# Patient Record
Sex: Female | Born: 1940
Health system: Southern US, Community
[De-identification: ages and names within clinical notes are randomized; demographics above are authoritative.]

## PROBLEM LIST (undated history)

## (undated) DIAGNOSIS — E7800 Pure hypercholesterolemia, unspecified: Secondary | ICD-10-CM

## (undated) DIAGNOSIS — K9189 Other postprocedural complications and disorders of digestive system: Secondary | ICD-10-CM

## (undated) DIAGNOSIS — C801 Malignant (primary) neoplasm, unspecified: Secondary | ICD-10-CM

## (undated) DIAGNOSIS — M810 Age-related osteoporosis without current pathological fracture: Secondary | ICD-10-CM

## (undated) DIAGNOSIS — I10 Essential (primary) hypertension: Secondary | ICD-10-CM

## (undated) DIAGNOSIS — J189 Pneumonia, unspecified organism: Secondary | ICD-10-CM

## (undated) DIAGNOSIS — R768 Other specified abnormal immunological findings in serum: Secondary | ICD-10-CM

## (undated) DIAGNOSIS — K567 Ileus, unspecified: Secondary | ICD-10-CM

## (undated) DIAGNOSIS — Z87891 Personal history of nicotine dependence: Secondary | ICD-10-CM

## (undated) DIAGNOSIS — K219 Gastro-esophageal reflux disease without esophagitis: Secondary | ICD-10-CM

## (undated) DIAGNOSIS — K56609 Unspecified intestinal obstruction, unspecified as to partial versus complete obstruction: Secondary | ICD-10-CM

## (undated) DIAGNOSIS — M1990 Unspecified osteoarthritis, unspecified site: Secondary | ICD-10-CM

## (undated) HISTORY — DX: Age-related osteoporosis without current pathological fracture: M81.0

## (undated) HISTORY — PX: BACK SURGERY: SHX140

## (undated) HISTORY — PX: TONSILLECTOMY: SUR1361

## (undated) HISTORY — PX: ABDOMINAL HYSTERECTOMY: SHX81

## (undated) HISTORY — PX: HERNIA REPAIR: SHX51

## (undated) HISTORY — DX: Other specified abnormal immunological findings in serum: R76.8

## (undated) HISTORY — PX: COLON SURGERY: SHX602

---

## 2001-01-27 DIAGNOSIS — R768 Other specified abnormal immunological findings in serum: Secondary | ICD-10-CM

## 2001-01-27 HISTORY — DX: Other specified abnormal immunological findings in serum: R76.8

## 2004-07-21 ENCOUNTER — Ambulatory Visit (HOSPITAL_COMMUNITY): Admission: RE | Admit: 2004-07-21 | Discharge: 2004-07-21 | Payer: Self-pay | Admitting: Family Medicine

## 2005-01-18 ENCOUNTER — Ambulatory Visit (HOSPITAL_COMMUNITY): Admission: RE | Admit: 2005-01-18 | Discharge: 2005-01-18 | Payer: Self-pay | Admitting: Family Medicine

## 2005-11-07 ENCOUNTER — Ambulatory Visit: Payer: Self-pay | Admitting: Internal Medicine

## 2005-11-07 ENCOUNTER — Encounter (INDEPENDENT_AMBULATORY_CARE_PROVIDER_SITE_OTHER): Payer: Self-pay | Admitting: *Deleted

## 2005-11-07 ENCOUNTER — Ambulatory Visit (HOSPITAL_COMMUNITY): Admission: RE | Admit: 2005-11-07 | Discharge: 2005-11-07 | Payer: Self-pay | Admitting: Internal Medicine

## 2005-11-19 ENCOUNTER — Encounter: Payer: Self-pay | Admitting: Emergency Medicine

## 2005-11-19 ENCOUNTER — Inpatient Hospital Stay (HOSPITAL_COMMUNITY): Admission: EM | Admit: 2005-11-19 | Discharge: 2005-11-22 | Payer: Self-pay | Admitting: Emergency Medicine

## 2005-11-19 ENCOUNTER — Ambulatory Visit: Payer: Self-pay | Admitting: Emergency Medicine

## 2006-12-04 ENCOUNTER — Ambulatory Visit (HOSPITAL_COMMUNITY): Admission: RE | Admit: 2006-12-04 | Discharge: 2006-12-04 | Payer: Self-pay | Admitting: Family Medicine

## 2007-01-27 ENCOUNTER — Emergency Department (HOSPITAL_COMMUNITY): Admission: EM | Admit: 2007-01-27 | Discharge: 2007-01-27 | Payer: Self-pay | Admitting: Emergency Medicine

## 2007-07-23 ENCOUNTER — Ambulatory Visit (HOSPITAL_COMMUNITY): Admission: RE | Admit: 2007-07-23 | Discharge: 2007-07-23 | Payer: Self-pay | Admitting: Family Medicine

## 2008-04-03 ENCOUNTER — Ambulatory Visit (HOSPITAL_COMMUNITY): Admission: RE | Admit: 2008-04-03 | Discharge: 2008-04-03 | Payer: Self-pay | Admitting: Family Medicine

## 2008-09-10 ENCOUNTER — Ambulatory Visit (HOSPITAL_COMMUNITY): Admission: RE | Admit: 2008-09-10 | Discharge: 2008-09-10 | Payer: Self-pay | Admitting: Family Medicine

## 2008-09-22 ENCOUNTER — Ambulatory Visit (HOSPITAL_COMMUNITY): Admission: RE | Admit: 2008-09-22 | Discharge: 2008-09-22 | Payer: Self-pay | Admitting: Family Medicine

## 2009-01-07 ENCOUNTER — Encounter: Admission: RE | Admit: 2009-01-07 | Discharge: 2009-01-07 | Payer: Self-pay | Admitting: General Surgery

## 2009-02-26 HISTORY — PX: ESOPHAGOGASTRODUODENOSCOPY: SHX1529

## 2010-10-14 NOTE — Op Note (Signed)
Terri Harris, Terri Harris                 ACCOUNT NO.:  [id number]   MEDICAL RECORD NO.:  [id number]          PATIENT TYPE:  INP   LOCATION:  3021                         FACILITY:  [facility]   PHYSICIAN:  Leslye Peer, M.D.  DATE OF BIRTH:  1940/08/07   DATE OF PROCEDURE:  DATE OF DISCHARGE:                                 OPERATIVE REPORT   REASON FOR CONSULTATION:  I was asked by Dr. Beckey Downing of neurology to  evaluate Terri Harris for ventilator-dependent respiratory failure.   BRIEF HISTORY:  Terri Harris is a 70 year old woman with a history of alcohol  abuse, osteopenia, depression, and a remote to transabdominal hysterectomy  for uterine cancer.  She may also have a small remote smoking history.  She  was noted to have altered mental status, inability to ambulate and speak,  and respiratory suppression by her family at home this afternoon.  EMS was  activated and when they arrived, she was unable to talk, not moving any of  her extremities in a coordinated manner.  Her blood pressure at that time  was 90/60.  She was taken urgently to [facility] where respiratory  rate was 6 per minute.  She was intubated and sedated.  A CT scan of her  head was performed due to concern about a possible stroke.  This did not  show any evidence of acute bleed or CVA.  Dr. Aundria Rud was contacted because  of their concern that she had neurological event, possibly even a brain stem  stroke.  She was transported urgently to the emergency department at [facility].  Subsequently, her toxicology evaluation revealed that she  had a blood alcohol level of 346.  The rest of her urine drug screen was  normal.  It was felt that her respiratory suppression and neurological  changes almost certainly were due to alcohol intoxication as she had no  evidence of a CVA on CT scan or exam.  We are consulted regarding her  ventilator-dependent respiratory status.   PAST MEDICAL HISTORY:  1.  Alcohol  abuse.  2.  Depression, formerly on Effexor.  3.  Osteopenia  4.  Transabdominal hysterectomy for uterine cancer 29 years ago.   ALLERGIES:  NO KNOWN DRUG ALLERGIES.   CURRENT MEDICATION:  None.   SOCIAL HISTORY:  The patient is married.  She is a former tobacco user; it  is unclear as to the amount.  She also has a long history of alcohol abuse.  She supposed had quit until about 1 month ago when she restarted.  She has  been on Antabuse in the past.   SOCIAL HISTORY:  Noncontributory.   PHYSICAL EXAM:  GENERAL:  This is a thin, slightly agitated woman who is  endotracheally intubated.  VITAL SIGNS:  She is afebrile.  Her heart rate 77, blood pressure 111/82  respiratory rate 20, SPO2 99% on the following vent settings, volume total  tidal volume 550 mL, respiratory rate 12, FIO2 50% PEEP of 5.  NECK:  Is without lymphadenopathy or JVD.  HEENT:  The endotracheal  tube was in good position.  She has some clear oral  secretions.  Her pupils are equally round and reactive to light and  accommodation.  HEART:  Regular without murmur.  LUNGS:  Clear to auscultation bilaterally.  ABDOMEN:  Well-healed midline scar.  There is no tenderness to palpation.  She has positive bowel sounds.  EXTREMITIES:  Have no cyanosis, clubbing or edema.  NEUROLOGICALLY:  She is a little bit sleepy but she follows commands.  She  tracks and moves all extremities to command.   LABORATORY EVALUATION:  Her blood alcohol level was 346 mg/dL as mentioned.  CBC, comprehensive metabolic panel and urinalysis from [facility] are all  reported as normal.  Her coagulation studies are reported as normal.  Her  chest x-ray was reviewed.  This shows endotracheal tube in good position  with some very mild left lower lobe atelectasis but otherwise completely  normal film.   IMPRESSION:  this is a 70 year old with ventilatory-dependent respiratory  failure due to alcohol ingestion and intoxication.  She currently is   spontaneously breathing, following commands and is waking up.   PLANS:  I will initiate a spontaneous breathing trial now and if she passes  the trial, I will extubate her in the emergency department.  She will then  be followed closely to ensure that she is not having any further respiratory  suppression until the alcohol is completely out of her system.  She will  likely be admitted by internal medicine overnight for observation.           ______________________________  Leslye Peer, M.D.     RSB/MEDQ  D:  11/19/2005  T:  11/20/2005  Job:  [id number]

## 2010-10-14 NOTE — Discharge Summary (Signed)
Terri Harris, Terri Harris                 ACCOUNT NO.:  [id number]   MEDICAL RECORD NO.:  [id number]          PATIENT TYPE:  INP   LOCATION:  3021                         FACILITY:  [facility]   PHYSICIAN:  Michaelyn Barter, M.D. DATE OF BIRTH:  1941/05/03   DATE OF ADMISSION:  11/19/2005  DATE OF DISCHARGE:  11/22/2005                                 DISCHARGE SUMMARY   Patient's primary care physician is unassigned.   FINAL DIAGNOSES:  1. Acute alcohol overdose.  2. Ventilator-dependent respiratory failure secondary to acute alcohol      overdose.  3. Corneal abrasion.  4. Depression.  5. Hypokalemia.   CONSULTATIONS:  1. Psychiatry with Dr. Jeanie Sewer.  2. Pulmonary critical care.   PROCEDURES:  1. Portable chest x-ray completed November 19, 2005.  2. A CT scan of the head without contrast complete November 19, 2005.   HISTORY OF PRESENT ILLNESS:  Terri Harris is a 70 year old female who  transferred from [facility] secondary to initially what was  believed to have possibly been a CVA.  She was intubated at [facility] due  to respiratory depression.  She was later discovered to have a blood alcohol  level of 350.  Dr. Thad Ranger, the neurologist in [place] was contacted and  asked the encompassed hospitalist to admit the patient.  Following her  admission to the hospital, she became more alert, and it became clear that  an alcohol overdose was the source of her respiratory depression.  Pulmonary  saw the patient in the emergency department, at which time she was  extubated.  It was reported that the patient had been babysitting her 5-year-  old granddaughter at the time of her overdose on alcohol.   PAST MEDICAL HISTORY:  Please see that dictated by Dr. Murray Hodgkins on November 19, 2005.   HOSPITAL COURSE:  1. Ventilator-dependent respiratory failure.  The patient was initially      intubated at [facility], but shortly after her arrival to the      emergency department at  [facility], she was extubated.  She      showed no other signs of respiratory distress throughout the course of      her hospitalization and never complained of any shortness of breath.  2. Alcohol overdose.  The patient was started on a multivitamin following      her admission into the hospital.  She never displayed any signs of      alcohol withdrawal throughout the course of her hospitalization.      Because of her history of alcohol abuse, psychiatry was consulted.  Dr.      Jeanie Sewer responded to the consultation on November 20, 2005.  He indicated      that the patient had a mood disorder not otherwise specified, acute      organic effects due to alcohol overdose, as well as functional history      of depression.  He also stated that she has alcohol dependence, as well      as an anxiety disorder not otherwise specified.  His recommendations      were that ideally, a chemical dependency inpatient rehabilitation      program with residential stay should be pursued once the patient was      medically cleared.  However, the patient and her husband both indicated      that they did not want to pursue inpatient treatment.  I spent close to      an hour trying to convince the patient to seek inpatient treatment.      She indicated that she wanted to go home instead.  3. Corneal abrasion.  The patient did not complain of any eye pain over      the course of her hospitalization.  4. Depression.  Again, Dr. Jeanie Sewer saw the patient, and he gave      recommendations with regards to medical management of her depression.  5. Hypokalemia.  The patient had a potassium level of 3.3 on June 25th      2007.  It was supplemented, and by June 26th, her potassium level was      noted to be 3.9.  By the date of discharge, the patient's condition      appeared to be stable.  Again, the patient and her husband both      indicated that they wanted to go home and that they did not want to      seek  inpatient treatment for alcohol abuse.   DISCHARGE MEDICATIONS:  The patient was discharged home on the following  medications:  1. Pepcid 20 mg 1 tablet p.o. b.i.d.  2. Folic acid 1 mg q. day.  3. Thiamine 100 mg 1 tablet q. day.  4. Effexor 37.5 mg 1 tablet q. day.  5. K-Dur 20 mEq 1 tablet q. day.   The patient was instructed to seek out chemical dependency inpatient  rehabilitation program with residential stay if possible and to stop  drinking alcohol, to also pursue marriage counseling.  She was told to  follow up with her primary care doctor within one to two weeks.      Michaelyn Barter, M.D.  Electronically Signed     OR/MEDQ  D:  12/22/2005  T:  12/22/2005  Job:  [id number]
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
## 2010-10-14 NOTE — Procedures (Signed)
Terri Harris, Terri Harris                 ACCOUNT NO.:  [id number]   MEDICAL RECORD NO.:  [id number]          PATIENT TYPE:  OUT   LOCATION:  RESP                          FACILITY:  [facility]   PHYSICIAN:  Edward L. Juanetta Gosling, M.D.DATE OF BIRTH:  1940/10/03   DATE OF PROCEDURE:  09/24/2008  DATE OF DISCHARGE:  09/22/2008                            PULMONARY FUNCTION TEST   PULMONARY FUNCTION TEST  1. Spirometry shows no definite ventilatory defect but does have what      appears to be airflow obstruction.  2. There is significant bronchodilator improvement.  This is      consistent with a clinical diagnosis of COPD.      Edward L. Juanetta Gosling, M.D.  Electronically Signed     ELH/MEDQ  D:  09/24/2008  T:  09/25/2008  Job:  [id number]   cc:   Lorin Picket A. Gerda Diss, MD  Fax: [phone]

## 2010-10-14 NOTE — H&P (Signed)
NAMECRYSTELLE, Terri Harris                 ACCOUNT NO.:  [id number]   MEDICAL RECORD NO.:  [id number]          PATIENT TYPE:  INP   LOCATION:  1828                         FACILITY:  [facility]   PHYSICIAN:  Lenon Curt. Chilton Si, M.D.  DATE OF BIRTH:  1940-07-14   DATE OF ADMISSION:  11/19/2005  DATE OF DISCHARGE:                                HISTORY & PHYSICAL   CHIEF COMPLAINT:  Respiratory depression; alcohol overdose.   HISTORY OF PRESENT ILLNESS:  This is a 70 year old white female followed in  primary care by Dr. Lilyan Punt in [place], [place], who was  transferred from [facility] to [facility] for a possible CVA.  The patient was intubated at [facility] due to respiratory depression.  She  was being loaded into the ambulance for transfer to [facility] when blood  alcohol results of 350 mg% was returned.  The staff an [facility] contacted  Dr. Thad Ranger, neurologist in [place], and he agreed to see the patient  and evaluate for CVA.  At the time of arrival at [facility], she was  beginning to get more alert, and it was clear that an alcohol overdose with  respiratory depression had occurred as opposed to a CVA.  Dr. Thad Ranger asked  the [facility] hospitalists to admit the patient.  She has been seen by  pulmonary in the emergency room, and successfully extubated.  This patient  has a prior history of alcohol abuse, although, by her husband's history,  she had not seemed to be doing badly lately.  She had been drinking more  beer than he had seen her do over the last year or two.  The patient was  babysitting a 11-year-old granddaughter at the time of her overdose of  alcohol.  She was found slumped over in a chair at the home of the  grandchild with decreased respirations.  The patient remained unresponsive  in the emergency room.  CT of the brain at [facility] was unremarkable, and  the labs were normal, except for the alcohol level of 348 mg/dl.  Drug  screen  was negative.   PAST HISTORY:  The patient is healthy.  1.  She has recurrent problems with indigestion and problems after eating      greasy meats or beef, according to her husband.  2.  Recurrent sinusitis episodes.  3.  In February of 2006, stress fracture of the left third and fourth      metatarsals was noted on x-ray.  Her husband states she has      osteoarthritis of the fingers and some decrease with hearing.   SURGERY:  Hysterectomy in the remote past.   ALLERGIES:  None.   MEDICATIONS:  1.  Over-the-counter multivitamins.  2.  Mylanta as needed.   FAMILY HISTORY:  Father died at age 64 of a myocardial infarction.  He had a  history of alcoholism.  Mother died at age 22 with cancer of the larynx and  possible colon.  She had 10 siblings.  Her husband is not aware of the  health of  all of these siblings.  At least 1 sister died of obesity and a  bad heart.  A brother died of a brain tumor.  The patient has 2 daughters,  both living and well, 1 with a pacemaker.  There is 1 grandchild, a  granddaughter, age 81, who is also living and well.   SOCIAL HISTORY:  The patient has been married once at an early age, and  continues to live with her husband.  She has been stressed in the last  couple of years, according to the husband.  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  Her husband retired about 6  months before that in 2005 from [employer].  They are now  battling getting health insurance.  The patient was raised in a very poor  family.  Her husband says that they both quit drinking a few years ago, but  she has resumed drinking beer lately.  The patient was hiding liquor before  she quit.  He drinks very little now.  The husband states that marital  stresses increased when they bought a beach house about 2 years ago at the  time of his retirement, and it has consumed his time and money, estimated  over $150,000.00.  The patient  has also rebelled against him controlling  her, and she has felt or expressed to him that he did not need to be looking  over her shoulder at all times.  Her husband states that he put new locks  on a bar that he has in his house to prevent her from getting into the  liquor there.  When asked what type liquor she preferred in the past, he  says anything, but typically a brown liquor.   REVIEW OF SYSTEMS:  From her husband.  GENERAL:  No complaints.  No  whitewash.  No fevers.  No other recurrent or chronic illnesses.  HEENT:  Wears contacts.  There is some diminished hearing.  PULMONARY:  No cough,  history of hemoptysis, chronic lung disorder, bronchitis or asthma.  HEART:  No palpitations, chest pains or known heart problems.  GASTROINTESTINAL:  Recurrent indigestion.  No prior history of ulcer disease, reflux disorders,  esophagitis, jaundice, hepatitis.  Does not generally have problems with  constipation or diarrhea.  GENITOURINARY:  No incontinence issues.  No  dysuria.  No recurrent urinary tract infections.  MUSCULOSKELETAL:  Osteoarthritis, especially of the hands and fingers.  Otherwise,  unremarkable.  No history of broken bones.  NEUROLOGIC:  No complaints.  PSYCHIATRIC:  Stress issues as noted under social history.  ENDOCRINE:  No  history of diabetes or thyroid disorder.   PHYSICAL EXAMINATION:  VITAL SIGNS:  Temperature 98.4, pulse 77 and regular,  respirations 16, blood pressure 117/66.  GENERAL:  Groggy but arousable female, cooperative with exam for the most  part, although she refuses to answer some questions.  HEENT:  Pupils equal,  round and reactive to light and accommodation.  Difficult to see into the  eyes.  She squeezes them shut pretty tightly most of the time due to the  discomfort, especially in the right eye, felt to be related to a corneal  abrasion when the contact lenses were removed following entry into the hospital.  Pinnae, external auditory canals and  TMs grossly normal.  Oropharynx without acute lesions or abrasions.  NECK:  Supple.  No thyromegaly, nodules, mass, bruit or adenopathy.  NODES:  None palpable  in cervical, axillary, inguinal or other areas.  SKIN:  Without petechiae, purpura or ecchymosis.  CHEST:  Clear to auscultation and percussion.  BREASTS:  Nontender.  No nodules or masses.  HEART:  Regular rhythm without gallop, murmur, click, rub, heave or thrill.  PMI at left fifth intercostal space in the midclavicular line.  ABDOMEN:  Nontender.  No organomegaly, mass or bruit.  GENITAL:  Deferred.  MUSCULOSKELETAL:  Grossly normal.  No significant deformities.  There is  mild tenderness of the fingers at the DIP and PIP joints.  NEUROLOGIC:  Sluggish, but otherwise grossly normal.  Cranial nerves intact.  No evidence of tremor.  No focal weakness.  Moves all extremities.  Babinski's shows downgoing greater toes bilaterally.  PSYCHIATRIC:  Mildly irritable.  Short answers.  Refuses to answer questions  about alcohol.   LABORATORY DATA:  Arterial blood gas on 100% oxygen while intubated showed a  pH of 7.389, PCO2 of 40.9, PO2 of 515.  CBC revealed hemoglobin of 14.4,  white blood cells 6400, platelets 227,000.  INR of 1.0.  Complete metabolic  panel revealed potassium of 3.3; otherwise normal.  Drug screen negative.  Alcohol level 346.  Urinalysis 0-2 RBCs per high power field.   PROBLEMS:  1.  Alcohol overdose, acute, with some chronic issues of alcohol abuse.  2.  Respiratory depression.  Spontaneously resolved after brief intubation      and respiratory support.  3.  Corneal abrasion, acute secondary to trauma from removing contact lens      in the emergency room.  4.  Marital and economic stresses as documented under social history.  5.  Possible depression.  May need or desire psychiatric referral.  The      issue should be explored once she is sober.      Lenon Curt Chilton Si, M.D.  Electronically Signed      AGG/MEDQ  D:  11/19/2005  T:  11/20/2005  Job:  [id number]

## 2010-10-14 NOTE — Consult Note (Signed)
Terri Harris, Terri Harris                 ACCOUNT NO.:  [id number]   MEDICAL RECORD NO.:  [id number]          PATIENT TYPE:  INP   LOCATION:  3021                         FACILITY:  [facility]   PHYSICIAN:  Casimiro Needle L. Reynolds, M.D.DATE OF BIRTH:  [birth year]   DATE OF CONSULTATION:  11/19/2005  DATE OF DISCHARGE:                                   CONSULTATION   REQUESTING PHYSICIAN:  [facility].   REASON FOR CONSULTATION:  Altered mental status, suspected stroke.   HISTORY OF PRESENT ILLNESS:  This is the initial inpatient consultation  evaluation of this 70 year old woman with no known chronic medical problems.  The patient reported to the [facility] emergency room today reportedly with  an acute alteration in mental status. According to records, the patient was  at home babysitting her 60-year-old granddaughter. She went into the house to  get a glass of water and the granddaughter later found her slumped over in a  chair. EMS was alerted, the patient had slow respirations and was  hypotensive with a blood pressure of 90/60 and a respiratory rate of 6. She  was described as not speaking clearly and not moving extremities. She did  not have focal weakness to one side or the other but was described as  generally weak. The patient was brought to the [facility] emergency room  where she was intubated and noted to have little if any gag reflex. Initial  concern was for an acute stroke. The patient had a CT of the head which did  not demonstrate a bleed. After discussion with the emergency room physician  at [facility], she was taken and transferred to [facility] emergency room  for further evaluation. Subsequently laboratory results began to return from  [facility] which demonstrated normal CBC, chemistries and drug screen but a  markedly elevated alcohol level in the 350 range. The patient was  transferred to [facility] for further stabilization and care and arrived  intubated. She  gradually became more responsive in the emergency department  and more examinable. Ultimately with the assistance of the critical care  team, the patient was extubated and is now alert. She is able to follow  commands and speak a few brief words. She is not presently having any pain.   PAST MEDICAL HISTORY:  She denies any chronic medical problems. She has a  previous history of hysterectomy for uterine cancer.   SOCIAL HISTORY:  She has had a history of alcohol abuse in the past. She was  not known to be actively drinking at this time according to family members  although she has had episodes of binge drinking and become ill before. There  is no known history of other illicit drugs. She has a remote smoking  history. She lives at home with her husband and used to work for [employer].   REVIEW OF SYSTEMS:  Not obtainable due to the patient's altered mental  state. There has been no definite recent illness according to family members  at the bedside.   MEDICATIONS:  None.  ALLERGIES:  Denies.   PHYSICAL EXAMINATION:  VITAL SIGNS:  Temperature 98.4, blood pressure  111/82, pulse 77, respirations 24, O2 sat 88% on room air.  GENERAL:  This is a thin but healthy-appearing woman supine in the hospital  bed in no evident distress.  HEENT:  Head, cranium was normocephalic, atraumatic. Oropharynx benign.  NECK:  Supple without carotid or supraclavicular bruits.  HEART:  Regular rate and rhythm without murmurs.  NEUROLOGIC:  Mental status, she arrived deeply comatose responsive only to  deep pain. Her mental status has gradually improved to the point that she is  now drowsy but able to form words and phrases and follow commands. Cranial  nerves, pupils equal and reactive. She has corneal reflexes. Extraocular  movements full without nystagmus. Face, tongue and palate move  symmetrically. Motor, normal bulk and tone, normal strength in all tested  extremity muscles. Sensation intact  to light touch in all extremities.  Reflexes 2+ and symmetric. Toes are downgoing bilaterally. Cerebellar and  gait are deferred.   LABORATORY DATA:  CBC unremarkable, CMET unremarkable. Urinalysis  unremarkable. Urine drug screen unremarkable. Alcohol level markedly  elevated as above.   CT of the head is personally reviewed and demonstrates no acute abnormality.   IMPRESSION:  Acute encephalopathy due to alcohol intoxication, clearing. No  evidence of stroke at this time.   PLAN:  Provide supportive care as she returns to her baseline. If she  becomes back to her normal baseline neurologically, there is no need for  further neural imaging or other workup.   Thank you for the consultation.      Michael L. Thad Ranger, M.D.  Electronically Signed     MLR/MEDQ  D:  11/19/2005  T:  11/20/2005  Job:  [id number]

## 2010-10-14 NOTE — Op Note (Signed)
Terri Harris, Terri Harris                 ACCOUNT NO.:  [id number]   MEDICAL RECORD NO.:  [id number]          PATIENT TYPE:  AMB   LOCATION:  DAY                           FACILITY:  [facility]   PHYSICIAN:  Lionel December, M.D.    DATE OF BIRTH:  1940-09-27   DATE OF PROCEDURE:  11/07/2005  DATE OF DISCHARGE:                                 OPERATIVE REPORT   PROCEDURE PERFORMED:  Colonoscopy.   INDICATIONS FOR PROCEDURE:  Adina is a 70 year old Caucasian female who was  average risk screening colonoscopy.  Procedure risks were reviewed with the  patient and informed consent was obtained.   MEDS FOR CONSCIOUS SEDATION:  Demerol 50 mg IV, Versed 6 mg IV.   FINDINGS:  Procedure performed in endoscopy suite.  The patient's vital  signs and oxygen saturation were monitored during the procedure and remained  stable.  The patient was placed in left lateral position and rectal  examination performed.  No abnormality noted on external or digital exam.  Olympus video scope was placed in the rectum and advanced under vision into  sigmoid colon and beyond.  Preparation was satisfactory.  Scope was passed  to cecum which was identified by ileocecal valve and appendiceal.  Pictures  taken for the record.  As the scope was withdrawn, colonic mucosa was  carefully examined.  There were three small sessile polyps at ascending  colon suspicious for hyperplastic polyps.  Two of these were ablated by cold  biopsy and submitted one container.  The third polyp was submitted in  separate container.  Mucosa of rest of the colon was normal.  The rectal  mucosa similarly was normal.  Scope was retroflexed to examine the anorectal  junction which is unremarkable except for focal thickening of mucosa in the  canal.  Pictures taken for the record.  Endoscope was straightened and  withdrawn.  The patient tolerated the procedure well.   FINAL DIAGNOSIS:  Three small polyps at ascending colon which were ablated  by cold  biopsy.  These are suspicious for hyperplastic polyp.  Histology  confirms this then she will not need __________  Three small sessile polyps  ablated by cold biopsy from ascending colon.  These are suspicious for  hyperplastic polyps.   RECOMMENDATIONS:  1.  Patient will resume her usual meds.  2.  I will be contacting patient with biopsy results and further      recommendations.      Lionel December, M.D.  Electronically Signed     NR/MEDQ  D:  11/07/2005  T:  11/07/2005  Job:  [id number]   cc:   Lorin Picket A. Gerda Diss, MD  Fax: [phone]

## 2010-12-10 ENCOUNTER — Emergency Department (HOSPITAL_COMMUNITY): Payer: Medicare Other

## 2010-12-10 ENCOUNTER — Emergency Department (HOSPITAL_COMMUNITY)
Admission: EM | Admit: 2010-12-10 | Discharge: 2010-12-10 | Disposition: A | Discharge status: Home / Self Care | Payer: Medicare Other | Attending: Emergency Medicine | Admitting: Emergency Medicine

## 2010-12-10 DIAGNOSIS — K4090 Unilateral inguinal hernia, without obstruction or gangrene, not specified as recurrent: Secondary | ICD-10-CM | POA: Insufficient documentation

## 2010-12-10 DIAGNOSIS — Z79899 Other long term (current) drug therapy: Secondary | ICD-10-CM | POA: Insufficient documentation

## 2010-12-10 DIAGNOSIS — F17200 Nicotine dependence, unspecified, uncomplicated: Secondary | ICD-10-CM | POA: Insufficient documentation

## 2010-12-10 DIAGNOSIS — R1032 Left lower quadrant pain: Secondary | ICD-10-CM | POA: Insufficient documentation

## 2010-12-10 LAB — DIFFERENTIAL
Basophils Absolute: 0.1 10*3/uL (ref 0.0–0.1)
Basophils Relative: 1 % (ref 0–1)
Eosinophils Absolute: 0.2 10*3/uL (ref 0.0–0.7)
Eosinophils Relative: 2 % (ref 0–5)
Lymphocytes Relative: 16 % (ref 12–46)
Lymphs Abs: 1.5 10*3/uL (ref 0.7–4.0)
Monocytes Absolute: 0.7 10*3/uL (ref 0.1–1.0)
Monocytes Relative: 8 % (ref 3–12)
Neutro Abs: 7.3 10*3/uL (ref 1.7–7.7)
Neutrophils Relative %: 75 % (ref 43–77)

## 2010-12-10 LAB — COMPREHENSIVE METABOLIC PANEL
ALT: 14 U/L (ref 0–35)
AST: 24 U/L (ref 0–37)
Albumin: 4.1 g/dL (ref 3.5–5.2)
Alkaline Phosphatase: 84 U/L (ref 39–117)
BUN: 16 mg/dL (ref 6–23)
CO2: 27 mEq/L (ref 19–32)
Calcium: 9.5 mg/dL (ref 8.4–10.5)
Chloride: 101 mEq/L (ref 96–112)
Creatinine, Ser: 0.67 mg/dL (ref 0.50–1.10)
GFR calc Af Amer: >60 mL/min (ref >60)
GFR calc non Af Amer: >60 mL/min (ref >60)
Glucose, Bld: 80 mg/dL (ref 70–99)
Potassium: 3.5 mEq/L (ref 3.5–5.1)
Sodium: 137 mEq/L (ref 135–145)
Total Bilirubin: 0.1 mg/dL — ABNORMAL LOW (ref 0.3–1.2)
Total Protein: 7.8 g/dL (ref 6.0–8.3)

## 2010-12-10 LAB — CBC
HCT: 41.2 % (ref 36.0–46.0)
Hemoglobin: 14 g/dL (ref 12.0–15.0)
MCH: 32.9 pg (ref 26.0–34.0)
MCHC: 34 g/dL (ref 30.0–36.0)
MCV: 96.7 fL (ref 78.0–100.0)
Platelets: 213 10*3/uL (ref 150–400)
RBC: 4.26 MIL/uL (ref 3.87–5.11)
RDW: 12.8 % (ref 11.5–15.5)
WBC: 9.8 10*3/uL (ref 4.0–10.5)

## 2010-12-10 LAB — URINALYSIS, ROUTINE W REFLEX MICROSCOPIC
Bilirubin Urine: NEGATIVE
Glucose, UA: NEGATIVE mg/dL
Ketones, ur: NEGATIVE mg/dL
Leukocytes, UA: NEGATIVE
Nitrite: NEGATIVE
Protein, ur: NEGATIVE mg/dL
Specific Gravity, Urine: <1.005 — ABNORMAL LOW (ref 1.005–1.030)
Urobilinogen, UA: 0.2 mg/dL (ref 0.0–1.0)
pH: 5.5 (ref 5.0–8.0)

## 2010-12-10 LAB — URINE MICROSCOPIC-ADD ON

## 2010-12-10 LAB — LIPASE, BLOOD: Lipase: 44 U/L (ref 11–59)

## 2010-12-10 MED ORDER — SODIUM CHLORIDE 0.9 % IV SOLN
Freq: Once | INTRAVENOUS | Status: AC
  Administered 2010-12-10: 100 mL via INTRAVENOUS

## 2010-12-10 MED ORDER — ONDANSETRON HCL 4 MG/2ML IJ SOLN
4.0000 mg | Freq: Once | INTRAMUSCULAR | Status: AC
  Administered 2010-12-10: 4 mg via INTRAVENOUS
  Filled 2010-12-10: qty 2

## 2010-12-10 MED ORDER — IOHEXOL 300 MG/ML  SOLN
100.0000 mL | Freq: Once | INTRAMUSCULAR | Status: AC | PRN
  Administered 2010-12-10: 100 mL via INTRAVENOUS

## 2010-12-10 NOTE — ED Notes (Signed)
Pt resting quietly with family at bedside.  Tolerated PIV insertion well.  Zofran given per orders.

## 2010-12-10 NOTE — ED Provider Notes (Signed)
History     Chief Complaint  Patient presents with  . Abdominal Pain   HPI  History reviewed. No pertinent past medical history.  Past Surgical History  Procedure Date  . Abdominal hysterectomy     History reviewed. No pertinent family history.  History  Substance Use Topics  . Smoking status: Current Some Day Smoker    Types: Cigarettes  . Smokeless tobacco: Not on file  . Alcohol Use: No    OB History    Grav Para Term Preterm Abortions TAB SAB Ect Mult Living                  Review of Systems  Physical Exam  BP 177/83  Pulse 73  Temp(Src) 97.9 F (36.6 C) (Oral)  Resp 20  Ht 5\' 6"  (1.676 m)  Wt 140 lb (63.504 kg)  BMI 22.60 kg/m2  SpO2 98%  Physical Exam  ED Course  Procedures  MDM       I performed a history and physical examination of Fanny Skates and discussed her management with PA Hyacinth Meeker.  I agree with the history, physical, assessment, and plan of care, with the following exceptions: None  I was present for the following procedures: None Time Spent in Critical Care of the patient: None Time spent in discussions with the patient and family:  PT relates she started getting LLQ pain that is a pressure during the night. States it is nagging. Has had nausea, without vomiting. Had one normal BM but has the urge to have more BM's. States her last colonoscopy was about 5 years ago and she had polyps but no diverticulosis. Pt has tenderness in the LLQ without guarding or rebound.  Jabaree Mercado Thomasenia Sales, MD 12/10/10 1600

## 2010-12-10 NOTE — ED Notes (Signed)
Pt presents with left sided abdominal pain radiating around to left side of back. Pt also c/o urinary pressure. Pt states symptoms started last night but have increased.

## 2010-12-10 NOTE — ED Provider Notes (Signed)
History     Chief Complaint  Patient presents with  . Abdominal Pain   Patient is a 70 y.o. female presenting with abdominal pain. The history is provided by the patient. No language interpreter was used.  Abdominal Pain The primary symptoms of the illness include abdominal pain and nausea. The primary symptoms of the illness do not include fever, vomiting, diarrhea, hematochezia, dysuria, vaginal discharge or vaginal bleeding. The current episode started yesterday. The onset of the illness was sudden. The problem has been gradually worsening.  The abdominal pain is located in the LLQ. The abdominal pain radiates to the left flank. The severity of the abdominal pain is 6/10. The abdominal pain is relieved by nothing.  Symptoms associated with the illness do not include urgency, hematuria or frequency.    History reviewed. No pertinent past medical history.  Past Surgical History  Procedure Date  . Abdominal hysterectomy     History reviewed. No pertinent family history.  History  Substance Use Topics  . Smoking status: Current Some Day Smoker    Types: Cigarettes  . Smokeless tobacco: Not on file  . Alcohol Use: No    OB History    Grav Para Term Preterm Abortions TAB SAB Ect Mult Living                  Review of Systems  Constitutional: Negative for fever.  Gastrointestinal: Positive for nausea and abdominal pain. Negative for vomiting, diarrhea and hematochezia.  Genitourinary: Positive for flank pain. Negative for dysuria, urgency, frequency, hematuria, vaginal bleeding, vaginal discharge and pelvic pain.    Physical Exam  BP 177/83  Pulse 73  Temp(Src) 97.9 F (36.6 C) (Oral)  Resp 20  Ht 5\' 6"  (1.676 m)  Wt 140 lb (63.504 kg)  BMI 22.60 kg/m2  SpO2 98%  Physical Exam  Nursing note and vitals reviewed. Constitutional: She is oriented to person, place, and time. Vital signs are normal. She appears well-developed and well-nourished.  HENT:  Head:  Normocephalic and atraumatic.  Right Ear: External ear normal.  Left Ear: External ear normal.  Nose: Nose normal.  Mouth/Throat: No oropharyngeal exudate.  Eyes: Conjunctivae and EOM are normal. Pupils are equal, round, and reactive to light. Right eye exhibits no discharge. Left eye exhibits no discharge. No scleral icterus.  Neck: Normal range of motion. Neck supple. No JVD present. No tracheal deviation present. No thyromegaly present.  Cardiovascular: Normal rate, regular rhythm, normal heart sounds, intact distal pulses and normal pulses.  Exam reveals no gallop and no friction rub.   No murmur heard. Pulmonary/Chest: Effort normal and breath sounds normal. No stridor. No respiratory distress. She has no wheezes. She has no rales. She exhibits no tenderness.  Abdominal: Soft. Normal appearance and bowel sounds are normal. She exhibits no distension and no mass. There is no tenderness. There is no rebound and no guarding.    Musculoskeletal: Normal range of motion. She exhibits no edema and no tenderness.  Lymphadenopathy:    She has no cervical adenopathy.  Neurological: She is alert and oriented to person, place, and time. She has normal reflexes. No cranial nerve deficit. Coordination normal. GCS eye subscore is 4. GCS verbal subscore is 5. GCS motor subscore is 6.  Reflex Scores:      Tricep reflexes are 2+ on the right side and 2+ on the left side.      Bicep reflexes are 2+ on the right side and 2+ on the left side.  Brachioradialis reflexes are 2+ on the right side and 2+ on the left side.      Patellar reflexes are 2+ on the right side and 2+ on the left side.      Achilles reflexes are 2+ on the right side and 2+ on the left side. Skin: Skin is warm and dry. No rash noted. She is not diaphoretic.  Psychiatric: She has a normal mood and affect. Her speech is normal and behavior is normal. Judgment and thought content normal. Cognition and memory are normal.    ED Course    Procedures       Worthy Rancher, PA 12/26/10 1709

## 2010-12-10 NOTE — ED Notes (Signed)
Radiology tech present to transport pt . Pt awake, orientated and slightly anxious about ct process. Pt reassured.

## 2010-12-10 NOTE — ED Notes (Signed)
Al Decant PA at bedside, Explained findings of all tests completed in ED today.  Pt and family asked appropriate questions and verbalized understanding.

## 2010-12-10 NOTE — ED Notes (Signed)
Family at bedside. 

## 2011-01-03 NOTE — ED Provider Notes (Signed)
Medical screening examination/treatment/procedure(s) were conducted as a shared visit with non-physician practitioner(s) and myself.  I personally evaluated the patient during the encounter  Ward Givens, MD 01/03/11 [phone]

## 2011-03-10 LAB — DIFFERENTIAL
Basophils Absolute: 0.1
Basophils Relative: 1
Eosinophils Absolute: 0.1
Eosinophils Relative: 2
Lymphocytes Relative: 30
Lymphs Abs: 1.7
Monocytes Absolute: 0.5
Monocytes Relative: 8
Neutro Abs: 3.3
Neutrophils Relative %: 58

## 2011-03-10 LAB — BASIC METABOLIC PANEL
BUN: 14
CO2: 24
Calcium: 9.3
Chloride: 107
Creatinine, Ser: 0.64
GFR calc Af Amer: >60
GFR calc non Af Amer: >60
Glucose, Bld: 110 — ABNORMAL HIGH
Potassium: 3.7
Sodium: 139

## 2011-03-10 LAB — POCT CARDIAC MARKERS
CKMB, poc: <1 — ABNORMAL LOW
Myoglobin, poc: 22.4
Operator id: 270681
Troponin i, poc: <0.05

## 2011-03-10 LAB — CBC
HCT: 43.5
Hemoglobin: 14.6
MCHC: 33.7
MCV: 95.9
Platelets: 220
RBC: 4.53
RDW: 13.1
WBC: 5.6

## 2011-07-06 DIAGNOSIS — J069 Acute upper respiratory infection, unspecified: Secondary | ICD-10-CM | POA: Diagnosis not present

## 2011-07-06 DIAGNOSIS — J40 Bronchitis, not specified as acute or chronic: Secondary | ICD-10-CM | POA: Diagnosis not present

## 2011-08-15 ENCOUNTER — Telehealth (INDEPENDENT_AMBULATORY_CARE_PROVIDER_SITE_OTHER): Payer: Self-pay | Admitting: *Deleted

## 2011-08-15 ENCOUNTER — Other Ambulatory Visit (INDEPENDENT_AMBULATORY_CARE_PROVIDER_SITE_OTHER): Payer: Self-pay | Admitting: *Deleted

## 2011-08-15 DIAGNOSIS — E782 Mixed hyperlipidemia: Secondary | ICD-10-CM | POA: Diagnosis not present

## 2011-08-15 DIAGNOSIS — R5381 Other malaise: Secondary | ICD-10-CM | POA: Diagnosis not present

## 2011-08-15 DIAGNOSIS — Z86010 Personal history of colonic polyps: Secondary | ICD-10-CM

## 2011-08-15 DIAGNOSIS — R5383 Other fatigue: Secondary | ICD-10-CM | POA: Diagnosis not present

## 2011-08-15 DIAGNOSIS — Z79899 Other long term (current) drug therapy: Secondary | ICD-10-CM | POA: Diagnosis not present

## 2011-08-15 NOTE — Telephone Encounter (Signed)
Patient needs movi prep 

## 2011-08-17 MED ORDER — PEG-KCL-NACL-NASULF-NA ASC-C 100 G PO SOLR: 1.0000 | Freq: Once | ORAL | Status: DC

## 2011-09-04 ENCOUNTER — Encounter (INDEPENDENT_AMBULATORY_CARE_PROVIDER_SITE_OTHER): Payer: Self-pay | Admitting: *Deleted

## 2011-09-27 ENCOUNTER — Telehealth (INDEPENDENT_AMBULATORY_CARE_PROVIDER_SITE_OTHER): Payer: Self-pay | Admitting: *Deleted

## 2011-09-27 NOTE — Telephone Encounter (Signed)
PCP/Requesting MD: scott luking  Name & DOB: Terri Harris 11-Oct-2040     Procedure: tcs  Reason/Indication:  Hx polyps  Has patient had this procedure before?  yes  If so, when, by whom and where?  6/07  Is there a family history of colon cancer?  no  Who?  What age when diagnosed?    Is patient diabetic?   no      Does patient have prosthetic heart valve?  no  Do you have a pacemaker?  no  Has patient had joint replacement within last 12 months?  no  Is patient on Coumadin, Plavix and/or Aspirin? no  Medications: see EPIC  Allergies: codeine  Medication Adjustment: none  Procedure date & time: 10/25/11

## 2011-09-28 NOTE — Telephone Encounter (Signed)
agree

## 2011-10-24 ENCOUNTER — Encounter (HOSPITAL_COMMUNITY): Payer: Self-pay | Admitting: Pharmacy Technician

## 2011-10-24 MED ORDER — SODIUM CHLORIDE 0.45 % IV SOLN
Freq: Once | INTRAVENOUS | Status: AC
  Administered 2011-10-25: 07:00:00 via INTRAVENOUS

## 2011-10-25 ENCOUNTER — Ambulatory Visit (HOSPITAL_COMMUNITY)
Admission: RE | Admit: 2011-10-25 | Discharge: 2011-10-25 | Disposition: A | Discharge status: Home / Self Care | Payer: Medicare Other | Source: Ambulatory Visit | Attending: Internal Medicine | Admitting: Internal Medicine

## 2011-10-25 ENCOUNTER — Encounter (HOSPITAL_COMMUNITY): Payer: Self-pay

## 2011-10-25 ENCOUNTER — Encounter (HOSPITAL_COMMUNITY)
Admission: RE | Disposition: A | Discharge status: Home / Self Care | Payer: Self-pay | Source: Ambulatory Visit | Attending: Internal Medicine

## 2011-10-25 DIAGNOSIS — Z860100 Personal history of colon polyps, unspecified: Secondary | ICD-10-CM | POA: Insufficient documentation

## 2011-10-25 DIAGNOSIS — Z09 Encounter for follow-up examination after completed treatment for conditions other than malignant neoplasm: Secondary | ICD-10-CM | POA: Diagnosis not present

## 2011-10-25 DIAGNOSIS — D126 Benign neoplasm of colon, unspecified: Secondary | ICD-10-CM | POA: Insufficient documentation

## 2011-10-25 HISTORY — DX: Gastro-esophageal reflux disease without esophagitis: K21.9

## 2011-10-25 HISTORY — DX: Pure hypercholesterolemia, unspecified: E78.00

## 2011-10-25 HISTORY — DX: Unspecified osteoarthritis, unspecified site: M19.90

## 2011-10-25 HISTORY — PX: COLONOSCOPY: SHX5424

## 2011-10-25 HISTORY — DX: Malignant (primary) neoplasm, unspecified: C80.1

## 2011-10-25 SURGERY — COLONOSCOPY
Anesthesia: Moderate Sedation

## 2011-10-25 MED ORDER — MEPERIDINE HCL 50 MG/ML IJ SOLN
INTRAMUSCULAR | Status: AC
  Filled 2011-10-25: qty 1

## 2011-10-25 MED ORDER — MIDAZOLAM HCL 5 MG/5ML IJ SOLN
INTRAMUSCULAR | Status: AC
  Filled 2011-10-25: qty 5

## 2011-10-25 MED ORDER — MIDAZOLAM HCL 5 MG/5ML IJ SOLN
INTRAMUSCULAR | Status: DC | PRN
  Administered 2011-10-25: 2 mg via INTRAVENOUS
  Administered 2011-10-25 (×2): 1 mg via INTRAVENOUS
  Administered 2011-10-25: 2 mg via INTRAVENOUS

## 2011-10-25 MED ORDER — MEPERIDINE HCL 50 MG/ML IJ SOLN
INTRAMUSCULAR | Status: DC | PRN
  Administered 2011-10-25 (×2): 25 mg via INTRAVENOUS

## 2011-10-25 MED ORDER — SODIUM CHLORIDE 0.9 % IJ SOLN
INTRAMUSCULAR | Status: DC | PRN
  Administered 2011-10-25: 10 mL

## 2011-10-25 MED ORDER — STERILE WATER FOR IRRIGATION IR SOLN
Status: DC | PRN
  Administered 2011-10-25: 09:00:00

## 2011-10-25 NOTE — Op Note (Signed)
COLONOSCOPY PROCEDURE REPORT  PATIENT:  [name]  MR#:  [id number] Birthdate:  April 23, 1941, 71 y.o., female Endoscopist:  Dr. Malissa Hippo, MD Referred By:  Dr. Lilyan Punt, M.D. Procedure Date: 10/25/2011  Procedure:   Colonoscopy  Indications:  Patient is 71 year old Caucasian female with history of serrated adenoma. Her last exam was in June 2007.  Informed Consent:  The procedure and risks were reviewed with the patient and informed consent was obtained.  Medications:  Demerol 50 mg IV Versed 6 mg IV  Description of procedure:  After a digital rectal exam was performed, that colonoscope was advanced from the anus through the rectum and colon to the area of the cecum, ileocecal valve and appendiceal orifice. The cecum was deeply intubated. These structures were well-seen and photographed for the record. From the level of the cecum and ileocecal valve, the scope was slowly and cautiously withdrawn. The mucosal surfaces were carefully surveyed utilizing scope tip to flexion to facilitate fold flattening as needed. The scope was pulled down into the rectum where a thorough exam including retroflexion was performed.  Findings:   Prep satisfactory. Over 2 cm size flat polyp at ascending colon with geographic shape. Part of the polyp could not be elevated with saline. Two fragments snared along with cold biopsy from two other areas. Part of this polyp was coagulated with argon plasma coagulator all of the polyp was not treated. Two small polyps at splenic flexure ablated via cold biopsy. Normal rectal mucosa. Thickened anoderm.  Therapeutic/Diagnostic Maneuvers Performed:  See above  Complications:  None  Cecal Withdrawal Time:  28 minutes  Impression:  Examination performed to cecum. Large flat polyp at ascending colon treated with combination of piecemeal polypectomy and APC. All of the polyp could not be lifted with saline injection. Two small polyps ablated via cold biopsy  from splenic flexure.  Recommendations:  Standard instructions given. I would be contacting patient with biopsy results and further recommendations.  Ednamae Schiano U  10/25/2011 8:35 AM  CC: Dr. Lilyan Punt, MD, MD & Dr. Bonnetta Barry ref. provider found

## 2011-10-25 NOTE — H&P (Signed)
Terri Harris is an 71 y.o. female.   Chief Complaint: Patient is here for colonoscopy. HPI: Patient is 71 year old Caucasian female who had serrated adenoma removed in June 2007. She is here for surveillance colonoscopy. In February she noted blood-tinged mucus that she cold. She was also constipated and this occurred. She does not have good appetite her weight has remained stable. She denies abdominal pain or frank bleeding. Family history is negative for colorectal carcinoma.  Past Medical History  Diagnosis Date  . Hypercholesteremia   . GERD (gastroesophageal reflux disease)   . Cancer     partial hysterectomy age 26-uterine cancer  . Arthritis     Past Surgical History  Procedure Date  . Abdominal hysterectomy     Family History  Problem Relation Age of Onset  . Anesthesia problems Neg Hx   . Hypotension Neg Hx   . Malignant hyperthermia Neg Hx   . Pseudochol deficiency Neg Hx    Social History:  reports that she quit smoking 7 days ago. Her smoking use included Cigarettes. She has a 5 pack-year smoking history. She does not have any smokeless tobacco history on file. She reports that she does not drink alcohol or use illicit drugs.  Allergies:  Allergies  Allergen Reactions  . Codeine Nausea And Vomiting    Medications Prior to Admission  Medication Sig Dispense Refill  . acetaminophen (TYLENOL) 500 MG tablet Take 1,000 mg by mouth every 6 (six) hours as needed. Pain      . Calcium Carbonate-Vitamin D (CALCIUM 600 + D PO) Take 2 tablets by mouth daily.      . Multiple Vitamin (MULITIVITAMIN WITH MINERALS) TABS Take 1 tablet by mouth daily.      . peg 3350 powder (MOVIPREP) 100 G SOLR Take 1 kit (100 g total) by mouth once.  1 kit  0    No results found for this or any previous visit (from the past 48 hour(s)). No results found.  ROS  Blood pressure 158/84, pulse 79, temperature 97.8 F (36.6 C), resp. rate 25, height 5\' 6"  (1.676 m), weight 140 lb (63.504 kg),  SpO2 98.00%. Physical Exam  Constitutional: She appears well-developed and well-nourished.  HENT:  Mouth/Throat: Oropharynx is clear and moist.  Eyes: Conjunctivae are normal. No scleral icterus.  Neck: No thyromegaly present.  Cardiovascular: Normal rate, regular rhythm and normal heart sounds.   No murmur heard. Respiratory: Effort normal and breath sounds normal.  GI: Soft. She exhibits no distension and no mass.  Musculoskeletal: She exhibits no edema.  Lymphadenopathy:    She has no cervical adenopathy.  Neurological: She is alert.  Skin: Skin is warm.     Assessment/Plan History of colonic serrated adenoma. Surveillance colonoscopy  Terri Harris U 10/25/2011, 7:31 AM

## 2011-10-25 NOTE — Discharge Instructions (Signed)
No aspirin or NSAIDs for 10 days. Resume usual medications and diet. No driving for 24 hours. Physician will contact you with biopsy results.  Colonoscopy Care After These instructions give you information on caring for yourself after your procedure. Your doctor may also give you more specific instructions. Call your doctor if you have any problems or questions after your procedure. HOME CARE  Take it easy for the next 24 hours.   Rest.   Walk or use warm packs on your belly (abdomen) if you have belly cramping or gas.   Do not drive for 24 hours.   You may shower.   Do not sign important papers or use machinery for 24 hours.   Drink enough fluids to keep your pee (urine) clear or pale yellow.   Resume your normal diet. Avoid heavy or fried foods.   Avoid alcohol.   Continue taking your normal medicines.   Only take medicine as told by your doctor. Do not take aspirin.  If you had growths (polyps) removed:  Do not take aspirin.   Do not drink alcohol for 7 days or as told by your doctor.   Eat a soft diet for 24 hours.  GET HELP RIGHT AWAY IF:  You have a fever.   You pass clumps of tissue (blood clots) or fill the toilet with blood.   You have belly pain that gets worse and medicine does not help.   Your belly is puffy (swollen).   You feel sick to your stomach (nauseous) or throw up (vomit).  MAKE SURE YOU:  Understand these instructions.   Will watch your condition.   Will get help right away if you are not doing well or get worse.  Document Released: 06/17/2010 Document Revised: 05/04/2011 Document Reviewed: 06/17/2010 [facility] Patient Information 2012 [place], [place].  Colon Polyps A polyp is extra tissue that grows inside your body. Colon polyps grow in the large intestine. The large intestine, also called the colon, is part of your digestive system. It is a long, hollow tube at the end of your digestive tract where your body makes and stores  stool. Most polyps are not dangerous. They are benign. This means they are not cancerous. But over time, some types of polyps can turn into cancer. Polyps that are smaller than a pea are usually not harmful. But larger polyps could someday become or may already be cancerous. To be safe, doctors remove all polyps and test them.  WHO GETS POLYPS? Anyone can get polyps, but certain people are more likely than others. You may have a greater chance of getting polyps if:  You are over 50.   You have had polyps before.   Someone in your family has had polyps.   Someone in your family has had cancer of the large intestine.   Find out if someone in your family has had polyps. You may also be more likely to get polyps if you:   Eat a lot of fatty foods.   Smoke.   Drink alcohol.   Do not exercise.   Eat too much.  SYMPTOMS  Most small polyps do not cause symptoms. People often do not know they have one until their caregiver finds it during a regular checkup or while testing them for something else. Some people do have symptoms like these:  Bleeding from the anus. You might notice blood on your underwear or on toilet paper after you have had a bowel movement.   Constipation or diarrhea  that lasts more than a week.   Blood in the stool. Blood can make stool look black or it can show up as red streaks in the stool.  If you have any of these symptoms, see your caregiver. HOW DOES THE DOCTOR TEST FOR POLYPS? The doctor can use four tests to check for polyps:  Digital rectal exam. The caregiver wears gloves and checks your rectum (the last part of the large intestine) to see if it feels normal. This test would find polyps only in the rectum. Your caregiver may need to do one of the other tests listed below to find polyps higher up in the intestine.   Barium enema. The caregiver puts a liquid called barium into your rectum before taking x-rays of your large intestine. Barium makes your  intestine look white in the pictures. Polyps are dark, so they are easy to see.   Sigmoidoscopy. With this test, the caregiver can see inside your large intestine. A thin flexible tube is placed into your rectum. The device is called a sigmoidoscope, which has a light and a tiny video camera in it. The caregiver uses the sigmoidoscope to look at the last third of your large intestine.   Colonoscopy. This test is like sigmoidoscopy, but the caregiver looks at all of the large intestine. It usually requires sedation. This is the most common method for finding and removing polyps.  TREATMENT   The caregiver will remove the polyp during sigmoidoscopy or colonoscopy. The polyp is then tested for cancer.   If you have had polyps, your caregiver may want you to get tested regularly in the future.  PREVENTION  There is not one sure way to prevent polyps. You might be able to lower your risk of getting them if you:  Eat more fruits and vegetables and less fatty food.   Do not smoke.   Avoid alcohol.   Exercise every day.   Lose weight if you are overweight.   Eating more calcium and folate can also lower your risk of getting polyps. Some foods that are rich in calcium are milk, cheese, and broccoli. Some foods that are rich in folate are chickpeas, kidney beans, and spinach.   Aspirin might help prevent polyps. Studies are under way.  Document Released: 02/09/2004 Document Revised: 05/04/2011 Document Reviewed: 07/17/2007 [facility] Patient Information 2012 [place], [place].

## 2011-10-27 ENCOUNTER — Encounter (HOSPITAL_COMMUNITY): Payer: Self-pay | Admitting: Internal Medicine

## 2011-10-30 ENCOUNTER — Encounter (INDEPENDENT_AMBULATORY_CARE_PROVIDER_SITE_OTHER): Payer: Self-pay | Admitting: *Deleted

## 2012-05-28 DIAGNOSIS — Z23 Encounter for immunization: Secondary | ICD-10-CM | POA: Diagnosis not present

## 2013-04-02 ENCOUNTER — Ambulatory Visit (INDEPENDENT_AMBULATORY_CARE_PROVIDER_SITE_OTHER): Payer: Medicare Other | Admitting: *Deleted

## 2013-04-02 DIAGNOSIS — Z23 Encounter for immunization: Secondary | ICD-10-CM

## 2013-04-11 ENCOUNTER — Encounter: Payer: Self-pay | Admitting: Nurse Practitioner

## 2013-04-11 ENCOUNTER — Ambulatory Visit (INDEPENDENT_AMBULATORY_CARE_PROVIDER_SITE_OTHER): Payer: Medicare Other | Admitting: Nurse Practitioner

## 2013-04-11 VITALS — BP 136/84 | Temp 98.3°F | Wt 141.0 lb

## 2013-04-11 DIAGNOSIS — K2970 Gastritis, unspecified, without bleeding: Secondary | ICD-10-CM

## 2013-04-11 DIAGNOSIS — K2990 Gastroduodenitis, unspecified, without bleeding: Secondary | ICD-10-CM

## 2013-04-11 DIAGNOSIS — R109 Unspecified abdominal pain: Secondary | ICD-10-CM

## 2013-04-11 MED ORDER — ONDANSETRON 8 MG PO TBDP: 8.0000 mg | ORAL_TABLET | Freq: Three times a day (TID) | ORAL | Status: DC | PRN

## 2013-04-11 MED ORDER — PANTOPRAZOLE SODIUM 40 MG PO TBEC: 40.0000 mg | DELAYED_RELEASE_TABLET | Freq: Two times a day (BID) | ORAL | Status: DC

## 2013-04-11 MED ORDER — PANTOPRAZOLE SODIUM 40 MG PO TBEC: 40.0000 mg | DELAYED_RELEASE_TABLET | Freq: Every day | ORAL | Status: DC

## 2013-04-11 MED ORDER — DICYCLOMINE HCL 10 MG PO CAPS: 10.0000 mg | ORAL_CAPSULE | Freq: Three times a day (TID) | ORAL | Status: DC

## 2013-04-13 ENCOUNTER — Encounter: Payer: Self-pay | Admitting: Nurse Practitioner

## 2013-04-13 DIAGNOSIS — M810 Age-related osteoporosis without current pathological fracture: Secondary | ICD-10-CM | POA: Insufficient documentation

## 2013-04-13 DIAGNOSIS — Z860100 Personal history of colon polyps, unspecified: Secondary | ICD-10-CM | POA: Insufficient documentation

## 2013-04-13 DIAGNOSIS — J449 Chronic obstructive pulmonary disease, unspecified: Secondary | ICD-10-CM | POA: Insufficient documentation

## 2013-04-13 DIAGNOSIS — R109 Unspecified abdominal pain: Secondary | ICD-10-CM | POA: Insufficient documentation

## 2013-04-13 DIAGNOSIS — K2970 Gastritis, unspecified, without bleeding: Secondary | ICD-10-CM | POA: Insufficient documentation

## 2013-04-13 NOTE — Assessment & Plan Note (Signed)
.   pantoprazole (PROTONIX) 40 MG tablet    Sig: Take 1 tablet (40 mg total) by mouth 2 (two) times daily. For acid reflux    Dispense:  60 tablet    Refill:  0    Order Specific Question:  Supervising Provider    Answer:  LUKING, WILLIAM S [[id number]]  . pantoprazole (PROTONIX) 40 MG tablet    Sig: Take 1 tablet (40 mg total) by mouth daily. For acid reflux    Dispense:  30 tablet    Refill:  5    To start after BID rx is finished. thanks    Order Specific Question:  Supervising Provider    Answer:  LUKING, WILLIAM S [[id number]]  . ondansetron (ZOFRAN-ODT) 8 MG disintegrating tablet    Sig: Take 1 tablet (8 mg total) by mouth every 8 (eight) hours as needed for nausea or vomiting.    Dispense:  30 tablet    Refill:  0    Order Specific Question:  Supervising Provider    Answer:  LUKING, WILLIAM S [[id number]]  . dicyclomine (BENTYL) 10 MG capsule    Sig: Take 1 capsule (10 mg total) by mouth 4 (four) times daily -  before meals and at bedtime. Prn abd spasms    Dispense:  40 capsule    Refill:  0    Order Specific Question:  Supervising Provider    Answer:  LUKING, WILLIAM S [[id number]]   Reviewed dietary measures and warning signs. If pain worsens, fever develops, frequent vomiting or blood in her stools patient to seek help immediately. Call back in 72 hours if no improvement. 

## 2013-04-13 NOTE — Assessment & Plan Note (Signed)
.   pantoprazole (PROTONIX) 40 MG tablet    Sig: Take 1 tablet (40 mg total) by mouth 2 (two) times daily. For acid reflux    Dispense:  60 tablet    Refill:  0    Order Specific Question:  Supervising Provider    Answer:  Merlyn Albert [[id number]]  . pantoprazole (PROTONIX) 40 MG tablet    Sig: Take 1 tablet (40 mg total) by mouth daily. For acid reflux    Dispense:  30 tablet    Refill:  5    To start after BID rx is finished. thanks    Order Specific Question:  Supervising Provider    Answer:  Merlyn Albert [[id number]]  . ondansetron (ZOFRAN-ODT) 8 MG disintegrating tablet    Sig: Take 1 tablet (8 mg total) by mouth every 8 (eight) hours as needed for nausea or vomiting.    Dispense:  30 tablet    Refill:  0    Order Specific Question:  Supervising Provider    Answer:  Merlyn Albert [[id number]]  . dicyclomine (BENTYL) 10 MG capsule    Sig: Take 1 capsule (10 mg total) by mouth 4 (four) times daily -  before meals and at bedtime. Prn abd spasms    Dispense:  40 capsule    Refill:  0    Order Specific Question:  Supervising Provider    Answer:  Merlyn Albert [[id number]]   Reviewed dietary measures and warning signs. If pain worsens, fever develops, frequent vomiting or blood in her stools patient to seek help immediately. Call back in 72 hours if no improvement.

## 2013-04-13 NOTE — Progress Notes (Signed)
Subjective:  Presents complaints of off-and-on mid to lower abdominal pain/cramping that began 48 hours ago. Was extremely intense 2 days ago with one episode of vomiting. Yesterday he had mild pain much improved. Today pain has come back and is more intense. Vomiting x1 today. Frequent belching. Took her Nexium x2 doses with no relief. No relief with Mylanta. No fever. No back pain. No diarrhea or constipation. Having regular bowel movements. No blood in her stool. No change in the color of her stool. No obvious heartburn or reflux symptoms. Her colonoscopy is up-to-date, had some polyps but otherwise normal. Patient was on Tagamet for years for reflux disease. No back pain. No urinary symptoms. Taking fluids well, limited food intake. Has a history of 5 small ulcers noted on her EGD a few years ago. Minimal caffeine use. No alcohol use. Occasional smoker. No NSAID use.   Objective:   BP 136/84  Temp(Src) 98.3 F (36.8 C) (Oral)  Wt 141 lb (63.957 kg)  NAD. Alert, oriented. Lungs clear. Heart regular rate rhythm. Abdomen mildly distended with active bowel sounds x4. Moderate generalized mid to lower abdominal tenderness, no specific tenderness noted. No obvious masses. Also distinct epigastric area tenderness noted. Frequent belching noted during office visit. No acute changes on CT scan of the abdomen and pelvis on 12/10/2010.  Assessment:Gastritis  Abdominal pain, unspecified site  Plan: Meds ordered this encounter  Medications  . pantoprazole (PROTONIX) 40 MG tablet    Sig: Take 1 tablet (40 mg total) by mouth 2 (two) times daily. For acid reflux    Dispense:  60 tablet    Refill:  0    Order Specific Question:  Supervising Provider    Answer:  Merlyn Albert [[id number]]  . pantoprazole (PROTONIX) 40 MG tablet    Sig: Take 1 tablet (40 mg total) by mouth daily. For acid reflux    Dispense:  30 tablet    Refill:  5    To start after BID rx is finished. thanks    Order Specific Question:   Supervising Provider    Answer:  Merlyn Albert [[id number]]  . ondansetron (ZOFRAN-ODT) 8 MG disintegrating tablet    Sig: Take 1 tablet (8 mg total) by mouth every 8 (eight) hours as needed for nausea or vomiting.    Dispense:  30 tablet    Refill:  0    Order Specific Question:  Supervising Provider    Answer:  Merlyn Albert [[id number]]  . dicyclomine (BENTYL) 10 MG capsule    Sig: Take 1 capsule (10 mg total) by mouth 4 (four) times daily -  before meals and at bedtime. Prn abd spasms    Dispense:  40 capsule    Refill:  0    Order Specific Question:  Supervising Provider    Answer:  Merlyn Albert [[id number]]   Reviewed dietary measures and warning signs. If pain worsens, fever develops, frequent vomiting or blood in her stools patient to seek help immediately. Call back in 72 hours if no improvement. Also strongly recommend wellness checkup in the near future.

## 2013-04-22 ENCOUNTER — Encounter: Payer: Self-pay | Admitting: *Deleted

## 2013-04-30 ENCOUNTER — Encounter: Payer: Medicare Other | Admitting: Nurse Practitioner

## 2013-05-31 ENCOUNTER — Emergency Department (HOSPITAL_COMMUNITY): Payer: Medicare Other

## 2013-05-31 ENCOUNTER — Encounter (HOSPITAL_COMMUNITY): Payer: Self-pay | Admitting: Emergency Medicine

## 2013-05-31 ENCOUNTER — Inpatient Hospital Stay (HOSPITAL_COMMUNITY)
Admission: EM | Admit: 2013-05-31 | Discharge: 2013-06-04 | DRG: 390 | Disposition: A | Discharge status: Home / Self Care | Payer: Medicare Other | Attending: Internal Medicine | Admitting: Internal Medicine

## 2013-05-31 DIAGNOSIS — K4090 Unilateral inguinal hernia, without obstruction or gangrene, not specified as recurrent: Secondary | ICD-10-CM | POA: Diagnosis not present

## 2013-05-31 DIAGNOSIS — M810 Age-related osteoporosis without current pathological fracture: Secondary | ICD-10-CM | POA: Diagnosis present

## 2013-05-31 DIAGNOSIS — R109 Unspecified abdominal pain: Secondary | ICD-10-CM | POA: Diagnosis not present

## 2013-05-31 DIAGNOSIS — Z8249 Family history of ischemic heart disease and other diseases of the circulatory system: Secondary | ICD-10-CM

## 2013-05-31 DIAGNOSIS — J449 Chronic obstructive pulmonary disease, unspecified: Secondary | ICD-10-CM | POA: Diagnosis not present

## 2013-05-31 DIAGNOSIS — Z90710 Acquired absence of both cervix and uterus: Secondary | ICD-10-CM | POA: Diagnosis not present

## 2013-05-31 DIAGNOSIS — K2970 Gastritis, unspecified, without bleeding: Secondary | ICD-10-CM | POA: Diagnosis not present

## 2013-05-31 DIAGNOSIS — Z8542 Personal history of malignant neoplasm of other parts of uterus: Secondary | ICD-10-CM

## 2013-05-31 DIAGNOSIS — J4489 Other specified chronic obstructive pulmonary disease: Secondary | ICD-10-CM | POA: Diagnosis not present

## 2013-05-31 DIAGNOSIS — K56609 Unspecified intestinal obstruction, unspecified as to partial versus complete obstruction: Secondary | ICD-10-CM | POA: Diagnosis not present

## 2013-05-31 DIAGNOSIS — E7800 Pure hypercholesterolemia, unspecified: Secondary | ICD-10-CM | POA: Diagnosis present

## 2013-05-31 DIAGNOSIS — M129 Arthropathy, unspecified: Secondary | ICD-10-CM | POA: Diagnosis present

## 2013-05-31 DIAGNOSIS — E860 Dehydration: Secondary | ICD-10-CM | POA: Diagnosis present

## 2013-05-31 DIAGNOSIS — Z833 Family history of diabetes mellitus: Secondary | ICD-10-CM | POA: Diagnosis not present

## 2013-05-31 DIAGNOSIS — Z87891 Personal history of nicotine dependence: Secondary | ICD-10-CM | POA: Diagnosis not present

## 2013-05-31 DIAGNOSIS — K219 Gastro-esophageal reflux disease without esophagitis: Secondary | ICD-10-CM | POA: Diagnosis present

## 2013-05-31 LAB — CBC WITH DIFFERENTIAL/PLATELET
Basophils Absolute: 0 10*3/uL (ref 0.0–0.1)
Basophils Relative: 0 % (ref 0–1)
Eosinophils Absolute: 0.1 10*3/uL (ref 0.0–0.7)
Eosinophils Relative: 1 % (ref 0–5)
HCT: 45.5 % (ref 36.0–46.0)
Hemoglobin: 14.7 g/dL (ref 12.0–15.0)
Lymphocytes Relative: 9 % — ABNORMAL LOW (ref 12–46)
Lymphs Abs: 1.2 10*3/uL (ref 0.7–4.0)
MCH: 31.2 pg (ref 26.0–34.0)
MCHC: 32.3 g/dL (ref 30.0–36.0)
MCV: 96.6 fL (ref 78.0–100.0)
Monocytes Absolute: 1.1 10*3/uL — ABNORMAL HIGH (ref 0.1–1.0)
Monocytes Relative: 9 % (ref 3–12)
Neutro Abs: 10.4 10*3/uL — ABNORMAL HIGH (ref 1.7–7.7)
Neutrophils Relative %: 81 % — ABNORMAL HIGH (ref 43–77)
Platelets: 353 10*3/uL (ref 150–400)
RBC: 4.71 MIL/uL (ref 3.87–5.11)
RDW: 12.6 % (ref 11.5–15.5)
WBC: 12.9 10*3/uL — ABNORMAL HIGH (ref 4.0–10.5)

## 2013-05-31 LAB — TROPONIN I: Troponin I: <0.3 ng/mL (ref <0.30)

## 2013-05-31 LAB — BASIC METABOLIC PANEL
BUN: 13 mg/dL (ref 6–23)
CO2: 30 mEq/L (ref 19–32)
Calcium: 10.2 mg/dL (ref 8.4–10.5)
Chloride: 95 mEq/L — ABNORMAL LOW (ref 96–112)
Creatinine, Ser: 0.63 mg/dL (ref 0.50–1.10)
GFR calc Af Amer: >90 mL/min (ref >90)
GFR calc non Af Amer: 88 mL/min — ABNORMAL LOW (ref >90)
Glucose, Bld: 96 mg/dL (ref 70–99)
Potassium: 3.9 mEq/L (ref 3.7–5.3)
Sodium: 141 mEq/L (ref 137–147)

## 2013-05-31 LAB — URINALYSIS, ROUTINE W REFLEX MICROSCOPIC
Bilirubin Urine: NEGATIVE
Glucose, UA: NEGATIVE mg/dL
Ketones, ur: 15 mg/dL — AB
Leukocytes, UA: NEGATIVE
Nitrite: NEGATIVE
Protein, ur: NEGATIVE mg/dL
Specific Gravity, Urine: 1.025 (ref 1.005–1.030)
Urobilinogen, UA: 0.2 mg/dL (ref 0.0–1.0)
pH: 8.5 — ABNORMAL HIGH (ref 5.0–8.0)

## 2013-05-31 LAB — LACTIC ACID, PLASMA: Lactic Acid, Venous: 1 mmol/L (ref 0.5–2.2)

## 2013-05-31 LAB — URINE MICROSCOPIC-ADD ON

## 2013-05-31 MED ORDER — HYDROMORPHONE HCL PF 1 MG/ML IJ SOLN
INTRAMUSCULAR | Status: AC
  Administered 2013-05-31: 1 mg via INTRAVENOUS
  Filled 2013-05-31: qty 1

## 2013-05-31 MED ORDER — IOHEXOL 300 MG/ML  SOLN
100.0000 mL | Freq: Once | INTRAMUSCULAR | Status: AC | PRN
  Administered 2013-05-31: 100 mL via INTRAVENOUS

## 2013-05-31 MED ORDER — ONDANSETRON HCL 4 MG/2ML IJ SOLN
4.0000 mg | Freq: Once | INTRAMUSCULAR | Status: DC
  Filled 2013-05-31: qty 2

## 2013-05-31 MED ORDER — ONDANSETRON HCL 4 MG/2ML IJ SOLN
4.0000 mg | Freq: Once | INTRAMUSCULAR | Status: AC
  Administered 2013-05-31: 4 mg via INTRAVENOUS
  Filled 2013-05-31: qty 2

## 2013-05-31 MED ORDER — HYDROMORPHONE HCL PF 1 MG/ML IJ SOLN
1.0000 mg | Freq: Once | INTRAMUSCULAR | Status: AC
  Administered 2013-05-31: 1 mg via INTRAVENOUS

## 2013-05-31 MED ORDER — MORPHINE SULFATE 4 MG/ML IJ SOLN
4.0000 mg | Freq: Once | INTRAMUSCULAR | Status: AC
  Administered 2013-05-31: 4 mg via INTRAVENOUS
  Filled 2013-05-31: qty 1

## 2013-05-31 MED ORDER — IOHEXOL 300 MG/ML  SOLN
50.0000 mL | Freq: Once | INTRAMUSCULAR | Status: AC | PRN
  Administered 2013-05-31: 50 mL via ORAL

## 2013-05-31 MED ORDER — ONDANSETRON HCL 4 MG/2ML IJ SOLN
4.0000 mg | Freq: Once | INTRAMUSCULAR | Status: AC
Start: 2013-05-31 — End: 2013-05-31
  Administered 2013-05-31: 4 mg via INTRAVENOUS

## 2013-05-31 MED ORDER — SODIUM CHLORIDE 0.9 % IV SOLN
INTRAVENOUS | Status: DC
  Administered 2013-05-31: 21:00:00 via INTRAVENOUS

## 2013-05-31 NOTE — ED Notes (Signed)
Pt complaining of back pain and pain moving up into chest, MD at the bedside. Ordered EKG

## 2013-05-31 NOTE — ED Notes (Signed)
Pt drank 1 and 1/2 bottle, spit up a mouth full, pain worse, MD aware, orders given, Called CT

## 2013-05-31 NOTE — ED Notes (Signed)
Pt states she has pain x 1 week, her stomach feels raw, with rolling pains, hx of ulcers, pt called pcp, office closed.  Pt has not been able to keep any foods down. Vomiting and diarrhea.

## 2013-05-31 NOTE — ED Notes (Signed)
Pt has been taking OTC meds for cold, seems like it made her stomach worse

## 2013-05-31 NOTE — ED Provider Notes (Signed)
CSN: [id number]     Arrival date & time 05/31/13  1742 History   First MD Initiated Contact with Patient 05/31/13 2014     Chief Complaint  Patient presents with  . Abdominal Pain   (Consider location/radiation/quality/duration/timing/severity/associated sxs/prior Treatment) Patient is a 73 y.o. female presenting with abdominal pain. The history is provided by the patient.  Abdominal Pain Associated symptoms: diarrhea, nausea and vomiting   Associated symptoms: no chest pain, no fever and no shortness of breath    patient is a nausea vomiting loose and diarrhea over the last several days. She also has had abdominal pain that is worse in the lower abdomen. She states the last 2 months she's had episodes where her abdomen feels as if she has gas although neither up or down. She states she's our primary care Dr. and was started on some medicines antispasmodics the first time that she took a second time and helped. He states this time it is more severe. She states has not gone away this time. No fevers. She states she still had some bowel movements. She states her abdomen has gotten larger. She does have a left-sided inguinal hernia that comes and goes but has been out more recently. She states she cannot put in. She's had a partial hysterectomy when she was 88 for uterine cancer. She's also had left back and flank pain.  Past Medical History  Diagnosis Date  . Hypercholesteremia   . GERD (gastroesophageal reflux disease)   . Cancer     partial hysterectomy age 32-uterine cancer  . Arthritis   . Osteoporosis   . ANA positive 01/2001   Past Surgical History  Procedure Laterality Date  . Abdominal hysterectomy    . Colonoscopy  10/25/2011    Procedure: COLONOSCOPY;  Surgeon: Rogene Houston, MD;  Location: [facility];  Service: Endoscopy;  Laterality: N/A;  930  . Tonsillectomy    . Esophagogastroduodenoscopy  02/2009   Family History  Problem Relation Age of Onset  . Anesthesia problems  Neg Hx   . Hypotension Neg Hx   . Malignant hyperthermia Neg Hx   . Pseudochol deficiency Neg Hx   . Hypertension Mother   . Heart disease Father   . Diabetes Sister   . Heart disease Sister   . Diabetes Brother   . Heart disease Brother    History  Substance Use Topics  . Smoking status: Former Smoker -- 0.25 packs/day for 20 years    Types: Cigarettes    Quit date: 10/18/2011  . Smokeless tobacco: Not on file  . Alcohol Use: No   OB History   Grav Para Term Preterm Abortions TAB SAB Ect Mult Living                 Review of Systems  Constitutional: Negative for fever, activity change and appetite change.  Eyes: Negative for pain.  Respiratory: Negative for chest tightness and shortness of breath.   Cardiovascular: Negative for chest pain and leg swelling.  Gastrointestinal: Positive for nausea, vomiting, abdominal pain and diarrhea.  Genitourinary: Positive for flank pain.  Musculoskeletal: Positive for back pain. Negative for neck stiffness.  Skin: Negative for rash.  Neurological: Negative for weakness, numbness and headaches.  Psychiatric/Behavioral: Negative for behavioral problems.    Allergies  Codeine  Home Medications   Current Outpatient Rx  Name  Route  Sig  Dispense  Refill  . acetaminophen (TYLENOL) 500 MG tablet   Oral   Take 1,000  mg by mouth every 6 (six) hours as needed. Pain         . ibuprofen (ADVIL,MOTRIN) 200 MG tablet   Oral   Take 400 mg by mouth every 6 (six) hours as needed. pain         . pantoprazole (PROTONIX) 40 MG tablet   Oral   Take 1 tablet (40 mg total) by mouth 2 (two) times daily. For acid reflux   60 tablet   0    BP 135/80  Pulse 73  Temp(Src) 98.7 F (37.1 C) (Oral)  Resp 20  Ht 5\' 5"  (1.651 m)  Wt 140 lb (63.504 kg)  BMI 23.30 kg/m2  SpO2 99% Physical Exam  Nursing note and vitals reviewed. Constitutional: She is oriented to person, place, and time. She appears well-developed and well-nourished.   HENT:  Head: Normocephalic and atraumatic.  Eyes: EOM are normal. Pupils are equal, round, and reactive to light.  Neck: Normal range of motion. Neck supple.  Cardiovascular: Normal rate, regular rhythm and normal heart sounds.   No murmur heard. Pulmonary/Chest: Effort normal and breath sounds normal. No respiratory distress. She has no wheezes. She has no rales.  Abdominal: Soft. Bowel sounds are normal. She exhibits distension and mass. There is tenderness. There is no rebound and no guarding.  Somewhat tender left inguinal hernia. Initially not reducible.  Musculoskeletal: Normal range of motion.  Neurological: She is alert and oriented to person, place, and time. No cranial nerve deficit.  Skin: Skin is warm and dry.  Psychiatric: She has a normal mood and affect. Her speech is normal.    ED Course  Procedures (including critical care time) Labs Review Labs Reviewed  CBC WITH DIFFERENTIAL - Abnormal; Notable for the following:    WBC 12.9 (*)    Neutrophils Relative % 81 (*)    Neutro Abs 10.4 (*)    Lymphocytes Relative 9 (*)    Monocytes Absolute 1.1 (*)    All other components within normal limits  URINALYSIS, ROUTINE W REFLEX MICROSCOPIC - Abnormal; Notable for the following:    pH 8.5 (*)    Hgb urine dipstick TRACE (*)    Ketones, ur 15 (*)    All other components within normal limits  BASIC METABOLIC PANEL - Abnormal; Notable for the following:    Chloride 95 (*)    GFR calc non Af Amer 88 (*)    All other components within normal limits  URINE MICROSCOPIC-ADD ON  TROPONIN I  LACTIC ACID, PLASMA   Imaging Review Dg Abd Acute W/chest  05/31/2013   CLINICAL DATA:  Abdominal pain  EXAM: ACUTE ABDOMEN SERIES (ABDOMEN 2 VIEW & CHEST 1 VIEW)  COMPARISON:  Abdominal CT 12/10/2010  FINDINGS: Markedly dilated loops of central small bowel, with evidence of fold thickening. The loops measure up to 5 cm in diameter and show fluid levels. No evidence of pneumatosis or bowel  perforation. Abdominal CT is currently pending.  No infiltrate, edema, effusion, or pneumothorax. Suspect biapical calcified pleural plaques. Normal heart size.  No acute osseous findings. Stable sclerotic focus in the right femoral head, consistent with bone island.  IMPRESSION: High-grade small bowel obstruction. No evidence of bowel perforation.   Electronically Signed   By: Jorje Guild M.D.   On: 05/31/2013 21:56    EKG Interpretation    Date/Time:  Saturday May 31 2013 21:04:21 EST Ventricular Rate:  76 PR Interval:  134 QRS Duration: 86 QT Interval:  394 QTC Calculation:  443 R Axis:   32 Text Interpretation:  Normal sinus rhythm with sinus arrhythmia Septal infarct , age undetermined Abnormal ECG No significant change since last tracing Confirmed by Weslie Rasmus  MD, Taym Twist ([id number]) on 05/31/2013 9:54:44 PM            MDM  No diagnosis found. Patient with abdominal pain and distention with nausea vomiting and some diarrhea. She had a left inguinal hernia it initially was not reducible, however with pain medicines and Trendelenburg positioning it reduced. She still remained tender in her abdomen with diffuse distention. CT scan was ordered. On reexamination the hernia has recurred, however it was reduced again.x-ray acute abdominal series appears to show a bowel obstruction. CT scan is pending at this time. Care will be turned over to Dr. Gillian Scarce R. Alvino Chapel, MD 05/31/13 2200

## 2013-05-31 NOTE — ED Notes (Signed)
Pt c/o abd pain with n/v/d since Monday. Pt states she has h/s ulcers and this feels similar.

## 2013-06-01 ENCOUNTER — Encounter (HOSPITAL_COMMUNITY): Payer: Self-pay | Admitting: Internal Medicine

## 2013-06-01 DIAGNOSIS — K4090 Unilateral inguinal hernia, without obstruction or gangrene, not specified as recurrent: Secondary | ICD-10-CM | POA: Diagnosis not present

## 2013-06-01 DIAGNOSIS — J449 Chronic obstructive pulmonary disease, unspecified: Secondary | ICD-10-CM | POA: Diagnosis not present

## 2013-06-01 DIAGNOSIS — K56609 Unspecified intestinal obstruction, unspecified as to partial versus complete obstruction: Secondary | ICD-10-CM | POA: Diagnosis present

## 2013-06-01 DIAGNOSIS — E860 Dehydration: Secondary | ICD-10-CM | POA: Diagnosis present

## 2013-06-01 DIAGNOSIS — R109 Unspecified abdominal pain: Secondary | ICD-10-CM

## 2013-06-01 LAB — COMPREHENSIVE METABOLIC PANEL
ALT: 10 U/L (ref 0–35)
AST: 18 U/L (ref 0–37)
Albumin: 3.5 g/dL (ref 3.5–5.2)
Alkaline Phosphatase: 86 U/L (ref 39–117)
BUN: 12 mg/dL (ref 6–23)
CO2: 29 mEq/L (ref 19–32)
Calcium: 9.1 mg/dL (ref 8.4–10.5)
Chloride: 99 mEq/L (ref 96–112)
Creatinine, Ser: 0.65 mg/dL (ref 0.50–1.10)
GFR calc Af Amer: >90 mL/min (ref >90)
GFR calc non Af Amer: 87 mL/min — ABNORMAL LOW (ref >90)
Glucose, Bld: 104 mg/dL — ABNORMAL HIGH (ref 70–99)
Potassium: 3.9 mEq/L (ref 3.7–5.3)
Sodium: 140 mEq/L (ref 137–147)
Total Bilirubin: 0.3 mg/dL (ref 0.3–1.2)
Total Protein: 7.4 g/dL (ref 6.0–8.3)

## 2013-06-01 LAB — CBC
HCT: 40.7 % (ref 36.0–46.0)
Hemoglobin: 13.3 g/dL (ref 12.0–15.0)
MCH: 31.7 pg (ref 26.0–34.0)
MCHC: 32.7 g/dL (ref 30.0–36.0)
MCV: 97.1 fL (ref 78.0–100.0)
Platelets: 305 10*3/uL (ref 150–400)
RBC: 4.19 MIL/uL (ref 3.87–5.11)
RDW: 12.8 % (ref 11.5–15.5)
WBC: 9.3 10*3/uL (ref 4.0–10.5)

## 2013-06-01 LAB — MRSA PCR SCREENING: MRSA by PCR: NEGATIVE

## 2013-06-01 MED ORDER — ACETAMINOPHEN 650 MG RE SUPP
650.0000 mg | Freq: Four times a day (QID) | RECTAL | Status: DC | PRN
  Administered 2013-06-01: 650 mg via RECTAL
  Filled 2013-06-01: qty 1

## 2013-06-01 MED ORDER — ACETAMINOPHEN 325 MG PO TABS: 650.0000 mg | ORAL_TABLET | Freq: Four times a day (QID) | ORAL | Status: DC | PRN

## 2013-06-01 MED ORDER — SODIUM CHLORIDE 0.9 % IV SOLN
INTRAVENOUS | Status: DC
  Administered 2013-06-01: 12:00:00 via INTRAVENOUS
  Administered 2013-06-01 (×2): 1000 mL via INTRAVENOUS
  Administered 2013-06-02 – 2013-06-03 (×3): via INTRAVENOUS

## 2013-06-01 MED ORDER — ONDANSETRON HCL 4 MG PO TABS: 4.0000 mg | ORAL_TABLET | Freq: Four times a day (QID) | ORAL | Status: DC | PRN

## 2013-06-01 MED ORDER — HYDROMORPHONE HCL PF 1 MG/ML IJ SOLN
1.0000 mg | INTRAMUSCULAR | Status: DC | PRN
  Administered 2013-06-01 – 2013-06-04 (×8): 1 mg via INTRAVENOUS
  Filled 2013-06-01 (×9): qty 1

## 2013-06-01 MED ORDER — MENTHOL 3 MG MT LOZG
1.0000 | LOZENGE | OROMUCOSAL | Status: DC | PRN
  Filled 2013-06-01: qty 9

## 2013-06-01 MED ORDER — ENOXAPARIN SODIUM 40 MG/0.4ML ~~LOC~~ SOLN
40.0000 mg | SUBCUTANEOUS | Status: DC
  Filled 2013-06-01: qty 0.4

## 2013-06-01 MED ORDER — PANTOPRAZOLE SODIUM 40 MG IV SOLR
40.0000 mg | INTRAVENOUS | Status: DC
  Administered 2013-06-01 – 2013-06-03 (×4): 40 mg via INTRAVENOUS
  Filled 2013-06-01 (×4): qty 40

## 2013-06-01 MED ORDER — ONDANSETRON HCL 4 MG/2ML IJ SOLN
4.0000 mg | Freq: Four times a day (QID) | INTRAMUSCULAR | Status: DC | PRN
  Administered 2013-06-01 (×2): 4 mg via INTRAVENOUS
  Filled 2013-06-01 (×2): qty 2

## 2013-06-01 NOTE — Consult Note (Signed)
Reason for Consult: Small bowel obstruction Referring Physician: [facility]  Terri Harris is an 73 y.o. female.  HPI: Patient is a 73 year old white female who presents with a two-day history of worsening abdominal pain and nausea. She had a CT scan of the abdomen which revealed a small bowel traction with a left inguinal hernia. There was no incarceration of bowel in the left inguinal hernia. She states she's had left inguinal hernia for some time, but has not bothered her. She has never been diagnosed with a bowel function. She has had a hysterectomy in the remote past. She had a colonoscopy by Dr. Laural Golden 2 years ago for polypoid disease. She has had some decrease in caliber of her stools over the past 2 months. No blood per rectum has been noted. Past Medical History  Diagnosis Date  . Hypercholesteremia   . GERD (gastroesophageal reflux disease)   . Arthritis   . Osteoporosis   . ANA positive 01/2001  . Cancer     partial hysterectomy age 10-uterine cancer    Past Surgical History  Procedure Laterality Date  . Abdominal hysterectomy    . Colonoscopy  10/25/2011    Procedure: COLONOSCOPY;  Surgeon: Rogene Houston, MD;  Location: [facility];  Service: Endoscopy;  Laterality: N/A;  930  . Tonsillectomy    . Esophagogastroduodenoscopy  02/2009    Family History  Problem Relation Age of Onset  . Anesthesia problems Neg Hx   . Hypotension Neg Hx   . Malignant hyperthermia Neg Hx   . Pseudochol deficiency Neg Hx   . Hypertension Mother   . Heart disease Father   . Diabetes Sister   . Heart disease Sister   . Diabetes Brother   . Heart disease Brother     Social History:  reports that she quit smoking about 19 months ago. Her smoking use included Cigarettes. She has a 5 pack-year smoking history. She does not have any smokeless tobacco history on file. She reports that she does not drink alcohol or use illicit drugs.  Allergies:  Allergies  Allergen Reactions  .  Codeine Nausea And Vomiting    Medications: I have reviewed the patient's current medications.  Results for orders placed during the hospital encounter of 05/31/13 (from the past 48 hour(s))  CBC WITH DIFFERENTIAL     Status: Abnormal   Collection Time    05/31/13  8:00 PM      Result Value Range   WBC 12.9 (*) 4.0 - 10.5 K/uL   RBC 4.71  3.87 - 5.11 MIL/uL   Hemoglobin 14.7  12.0 - 15.0 g/dL   HCT 45.5  36.0 - 46.0 %   MCV 96.6  78.0 - 100.0 fL   MCH 31.2  26.0 - 34.0 pg   MCHC 32.3  30.0 - 36.0 g/dL   RDW 12.6  11.5 - 15.5 %   Platelets 353  150 - 400 K/uL   Neutrophils Relative % 81 (*) 43 - 77 %   Neutro Abs 10.4 (*) 1.7 - 7.7 K/uL   Lymphocytes Relative 9 (*) 12 - 46 %   Lymphs Abs 1.2  0.7 - 4.0 K/uL   Monocytes Relative 9  3 - 12 %   Monocytes Absolute 1.1 (*) 0.1 - 1.0 K/uL   Eosinophils Relative 1  0 - 5 %   Eosinophils Absolute 0.1  0.0 - 0.7 K/uL   Basophils Relative 0  0 - 1 %   Basophils Absolute  0.0  0.0 - 0.1 K/uL  BASIC METABOLIC PANEL     Status: Abnormal   Collection Time    05/31/13  8:00 PM      Result Value Range   Sodium 141  137 - 147 mEq/L   Comment: Please note change in reference range.   Potassium 3.9  3.7 - 5.3 mEq/L   Comment: Please note change in reference range.   Chloride 95 (*) 96 - 112 mEq/L   CO2 30  19 - 32 mEq/L   Glucose, Bld 96  70 - 99 mg/dL   BUN 13  6 - 23 mg/dL   Creatinine, Ser 0.63  0.50 - 1.10 mg/dL   Calcium 10.2  8.4 - 10.5 mg/dL   GFR calc non Af Amer 88 (*) >90 mL/min   GFR calc Af Amer >90  >90 mL/min   Comment: (NOTE)     The eGFR has been calculated using the CKD EPI equation.     This calculation has not been validated in all clinical situations.     eGFR's persistently <90 mL/min signify possible Chronic Kidney     Disease.  URINALYSIS, ROUTINE W REFLEX MICROSCOPIC     Status: Abnormal   Collection Time    05/31/13  8:58 PM      Result Value Range   Color, Urine YELLOW  YELLOW   APPearance CLEAR  CLEAR    Specific Gravity, Urine 1.025  1.005 - 1.030   pH 8.5 (*) 5.0 - 8.0   Glucose, UA NEGATIVE  NEGATIVE mg/dL   Hgb urine dipstick TRACE (*) NEGATIVE   Bilirubin Urine NEGATIVE  NEGATIVE   Ketones, ur 15 (*) NEGATIVE mg/dL   Protein, ur NEGATIVE  NEGATIVE mg/dL   Urobilinogen, UA 0.2  0.0 - 1.0 mg/dL   Nitrite NEGATIVE  NEGATIVE   Leukocytes, UA NEGATIVE  NEGATIVE  URINE MICROSCOPIC-ADD ON     Status: None   Collection Time    05/31/13  8:58 PM      Result Value Range   RBC / HPF 0-2  <3 RBC/hpf  TROPONIN I     Status: None   Collection Time    05/31/13  9:54 PM      Result Value Range   Troponin I <0.30  <0.30 ng/mL   Comment:            Due to the release kinetics of cTnI,     a negative result within the first hours     of the onset of symptoms does not rule out     myocardial infarction with certainty.     If myocardial infarction is still suspected,     repeat the test at appropriate intervals.  LACTIC ACID, PLASMA     Status: None   Collection Time    05/31/13  9:54 PM      Result Value Range   Lactic Acid, Venous 1.0  0.5 - 2.2 mmol/L  MRSA PCR SCREENING     Status: None   Collection Time    06/01/13  1:13 AM      Result Value Range   MRSA by PCR NEGATIVE  NEGATIVE   Comment:            The GeneXpert MRSA Assay (FDA     approved for NASAL specimens     only), is one component of a     comprehensive MRSA colonization     surveillance program. It is not  intended to diagnose MRSA     infection nor to guide or     monitor treatment for     MRSA infections.  COMPREHENSIVE METABOLIC PANEL     Status: Abnormal   Collection Time    06/01/13  5:07 AM      Result Value Range   Sodium 140  137 - 147 mEq/L   Comment: Please note change in reference range.   Potassium 3.9  3.7 - 5.3 mEq/L   Comment: Please note change in reference range.   Chloride 99  96 - 112 mEq/L   CO2 29  19 - 32 mEq/L   Glucose, Bld 104 (*) 70 - 99 mg/dL   BUN 12  6 - 23 mg/dL   Creatinine,  Ser 0.65  0.50 - 1.10 mg/dL   Calcium 9.1  8.4 - 10.5 mg/dL   Total Protein 7.4  6.0 - 8.3 g/dL   Albumin 3.5  3.5 - 5.2 g/dL   AST 18  0 - 37 U/L   ALT 10  0 - 35 U/L   Alkaline Phosphatase 86  39 - 117 U/L   Total Bilirubin 0.3  0.3 - 1.2 mg/dL   GFR calc non Af Amer 87 (*) >90 mL/min   GFR calc Af Amer >90  >90 mL/min   Comment: (NOTE)     The eGFR has been calculated using the CKD EPI equation.     This calculation has not been validated in all clinical situations.     eGFR's persistently <90 mL/min signify possible Chronic Kidney     Disease.  CBC     Status: None   Collection Time    06/01/13  5:07 AM      Result Value Range   WBC 9.3  4.0 - 10.5 K/uL   RBC 4.19  3.87 - 5.11 MIL/uL   Hemoglobin 13.3  12.0 - 15.0 g/dL   HCT 40.7  36.0 - 46.0 %   MCV 97.1  78.0 - 100.0 fL   MCH 31.7  26.0 - 34.0 pg   MCHC 32.7  30.0 - 36.0 g/dL   RDW 12.8  11.5 - 15.5 %   Platelets 305  150 - 400 K/uL    Ct Abdomen Pelvis W Contrast  05/31/2013   CLINICAL DATA:  Vomiting and epigastric pain  EXAM: CT ABDOMEN AND PELVIS WITH CONTRAST  TECHNIQUE: Multidetector CT imaging of the abdomen and pelvis was performed using the standard protocol following bolus administration of intravenous contrast.  CONTRAST:  96m OMNIPAQUE IOHEXOL 300 MG/ML SOLN, 1053mOMNIPAQUE IOHEXOL 300 MG/ML SOLN  COMPARISON:  Prior radiograph from earlier the same day and CT from 12/10/2010  FINDINGS: Linear atelectasis and/ present within the lingula.  The liver demonstrates a normal contrast enhanced appearance. Gallbladder is within normal limits. No biliary ductal dilatation. The spleen, adrenal glands, and pancreas demonstrate a normal contrast enhanced appearance.  The kidneys demonstrate symmetric enhancement. No nephrolithiasis, hydronephrosis, or focal enhancing renal mass.  Multiple dilated fluid-filled loops of small bowel are seen throughout the upper, mid, and lower abdomen. These measure up to 3.5 cm in diameter.  Scattered fluid fluid levels are present. There is an apparent transition point within the lower mid pelvis (series 2, image 62). The small bowel is decompressed distally. The colon is largely decompressed. Small amount inflammatory fat stranding seen about these dilated loops of bowel. Findings are consist palpable with small bowel obstruction.  A left inguinal hernia is present containing  fat and vessels. There is the hernia sac measures approximately 4.5 x 3.4 cm. There is inflammatory fat stranding with fluid density, suggestive of associated fat necrosis. No herniated loops of bowel seen within the hernia sac.  Bladder is within normal limits. Uterus is not visualized. Ovaries are not definitely visualized either.  No free intraperitoneal air. There is a small amount of free perihepatic fluid.  No pathologically enlarged intra-abdominal pelvic lymph nodes. Scattered calcified and noncalcified atheromatous disease seen throughout the intra-abdominal aorta and its branch vessels.  No acute osseous abnormality. No focal lytic or blastic osseous lesions. Moderate degenerative disc disease noted within the lumbar spine.  IMPRESSION: 1. Findings compatible with small bowel obstruction with transition point in the lower mid pelvis. Finding is likely related to adhesive disease. 2. Fat and vessel containing left inguinal hernia with associated inflammatory changes, suggestive of fat necrosis. 3. Small volume free fluid adjacent to the liver, thought to be related to underlying obstructive process.   Electronically Signed   By: Jeannine Boga M.D.   On: 05/31/2013 23:27   Dg Abd Acute W/chest  05/31/2013   CLINICAL DATA:  Abdominal pain  EXAM: ACUTE ABDOMEN SERIES (ABDOMEN 2 VIEW & CHEST 1 VIEW)  COMPARISON:  Abdominal CT 12/10/2010  FINDINGS: Markedly dilated loops of central small bowel, with evidence of fold thickening. The loops measure up to 5 cm in diameter and show fluid levels. No evidence of pneumatosis  or bowel perforation. Abdominal CT is currently pending.  No infiltrate, edema, effusion, or pneumothorax. Suspect biapical calcified pleural plaques. Normal heart size.  No acute osseous findings. Stable sclerotic focus in the right femoral head, consistent with bone island.  IMPRESSION: High-grade small bowel obstruction. No evidence of bowel perforation.   Electronically Signed   By: Jorje Guild M.D.   On: 05/31/2013 21:56    ROS: See chart Blood pressure 146/73, pulse 74, temperature 98.1 F (36.7 C), temperature source Oral, resp. rate 17, height _0  (1.651 m), weight 63.3 kg (139 lb 8.8 oz), SpO2 92.00%. Physical Exam: Pleasant white female in no acute distress. Abdomen is soft with slight distention noted. No rigidity noted. A reducible left inguinal hernias noted. No hepatosplenomegaly or masses are noted.  Assessment/Plan: Impression: Small bowel obstruction secondary to adhesive disease Plan: No need for acute surgical intervention at this time. Would continue nasogastric tube decompression. We'll follow with you. Her management has been explained to the patient, who understands and agrees to the treatment plan.  Akesha Uresti A 06/01/2013, 10:53 AM

## 2013-06-01 NOTE — Progress Notes (Signed)
Patient refused Lovenox injection.  Patient stated that her "stomach was very sore".  Dr. Roderic Palau on unit and asked about changing to SCDs.  Verbal order give to discontinue Lovenox and place SCDs.

## 2013-06-01 NOTE — Progress Notes (Signed)
Patient admitted to the hospital by Dr. [place] Pink earlier this morning.  Patient seen and examined.  She has been admitted with small bowel obstruction.  She has had NG tube placed with some symptomatic relief. Surgical consult is pending.  Continue supportive treatment.  Terri Harris

## 2013-06-01 NOTE — Plan of Care (Signed)
Problem: Consults Goal: General Medical Patient Education See Patient Education Module for specific education. Outcome: Progressing Admitted from ED with SBO, NGT to left nare and abdominal pain  Problem: Phase I Progression Outcomes Goal: Pain controlled with appropriate interventions Outcome: Progressing Patient managing a pain level of 4, and does not want pain medication as of yet. Monitoring closely Goal: Initial discharge plan identified Outcome: Progressing To home with spouse

## 2013-06-01 NOTE — ED Notes (Signed)
16Fr NG tube inserted, pt tolerated well, pt getting relief, hospitalist at the bedside at this time.

## 2013-06-01 NOTE — H&P (Signed)
[facility] History and Physical  Terri Harris PQZ:[id number] DOB: 05-11-1941 DOA: 05/31/2013   PCP: Sallee Lange, MD  Specialists: None  Chief Complaint: Cough, abdominal pain with nausea and vomiting  HPI: Terri Harris is a 73 y.o. female with a past medical history that is unremarkable except for remote history of cervical versus uterine cancer for which she underwent a partial hysterectomy almost 40 years ago. She tells me that she was in her usual state of health till this past Monday when she started feeling sick. She started having central abdominal pain, cramping in nature, without any radiation, and was 10 out of 10 in intensity. She had no fever or had some chills. She then started having vomiting. She has thrown up about 5-6 times bilious material without any blood in it. She had a small bowel movement this morning and now she is struggling to pass gas. She has noticed that her bowel habits have changed in the last few months and that she has a sudden urgency in the early morning to go to the bathroom. Denies any blood in the stools. She tells me also that lately, that she's been having some stomach problems. Back in November she had an episode of abdominal bloating for which she was seen by her primary care physician and was prescribed a PPI and given some antispasmodics. And that improved subsequently. She had again an episode of nausea, vomiting in December, which also subsided with medications. She's never had small bowel obstruction before. She had a colonoscopy in May of 2013, which revealed a flat polyp in the ascending colon. Two small polyps were ablated from the splenic flexure.  Home Medications: Prior to Admission medications   Medication Sig Start Date End Date Taking? Authorizing Provider  acetaminophen (TYLENOL) 500 MG tablet Take 1,000 mg by mouth every 6 (six) hours as needed. Pain   Yes Historical Provider, MD  ibuprofen (ADVIL,MOTRIN) 200 MG tablet Take 400 mg by  mouth every 6 (six) hours as needed. pain   Yes Historical Provider, MD  pantoprazole (PROTONIX) 40 MG tablet Take 1 tablet (40 mg total) by mouth 2 (two) times daily. For acid reflux 04/11/13  Yes Nilda Simmer, NP    Allergies:  Allergies  Allergen Reactions  . Codeine Nausea And Vomiting    Past Medical History: Past Medical History  Diagnosis Date  . Hypercholesteremia   . GERD (gastroesophageal reflux disease)   . Arthritis   . Osteoporosis   . ANA positive 01/2001  . Cancer     partial hysterectomy age 52-uterine cancer    Past Surgical History  Procedure Laterality Date  . Abdominal hysterectomy    . Colonoscopy  10/25/2011    Procedure: COLONOSCOPY;  Surgeon: Rogene Houston, MD;  Location: [facility];  Service: Endoscopy;  Laterality: N/A;  930  . Tonsillectomy    . Esophagogastroduodenoscopy  02/2009    Social History: She lives with her husband. Quit smoking about a year ago. No alcohol use illicit drug use. She is independent with daily activities.  Family History:  Family History  Problem Relation Age of Onset  . Anesthesia problems Neg Hx   . Hypotension Neg Hx   . Malignant hyperthermia Neg Hx   . Pseudochol deficiency Neg Hx   . Hypertension Mother   . Heart disease Father   . Diabetes Sister   . Heart disease Sister   . Diabetes Brother   . Heart disease Brother  Review of Systems - History obtained from the patient General ROS: negative Psychological ROS: negative Ophthalmic ROS: negative ENT ROS: negative Allergy and Immunology ROS: negative Hematological and Lymphatic ROS: negative Endocrine ROS: negative Respiratory ROS: no cough, shortness of breath, or wheezing Cardiovascular ROS: no chest pain or dyspnea on exertion Gastrointestinal ROS: as in hpi Genito-Urinary ROS: no dysuria, trouble voiding, or hematuria Musculoskeletal ROS: negative Neurological ROS: no TIA or stroke symptoms Dermatological ROS: negative  Physical  Examination  Filed Vitals:   05/31/13 2142 05/31/13 2212 05/31/13 2312 05/31/13 2348  BP: 135/80 133/73 141/68 126/62  Pulse: 73 77  71  Temp:      TempSrc:      Resp: 20 22 17 15   Height:      Weight:      SpO2: 99% 99% 97% 95%    General appearance: alert, cooperative, appears stated age and no distress Head: Normocephalic, without obvious abnormality, atraumatic Eyes: conjunctivae/corneas clear. PERRL, EOM's intact.  Throat: lips, mucosa, and tongue normal; teeth and gums normal Neck: no adenopathy, no carotid bruit, no JVD, supple, symmetrical, trachea midline and thyroid not enlarged, symmetric, no tenderness/mass/nodules Back: symmetric, no curvature. ROM normal. No CVA tenderness. Resp: clear to auscultation bilaterally Cardio: regular rate and rhythm, S1, S2 normal, no murmur, click, rub or gallop GI: Abdomen is soft. There is tenderness in the central part without any rebound, rigidity, or guarding. Left-sided inguinal hernia is seen without any tenderness in that area. Didn't make any attempt to reduce it at this time. Bowel sounds are sluggish, but present. No masses, or organomegaly noted at this time. Extremities: extremities normal, atraumatic, no cyanosis or edema Pulses: 2+ and symmetric Skin: Skin color, texture, turgor normal. No rashes or lesions Neurologic: No focal deficits. Patient is alert and oriented x3.  Laboratory Data: Results for orders placed during the hospital encounter of 05/31/13 (from the past 48 hour(s))  CBC WITH DIFFERENTIAL     Status: Abnormal   Collection Time    05/31/13  8:00 PM      Result Value Range   WBC 12.9 (*) 4.0 - 10.5 K/uL   RBC 4.71  3.87 - 5.11 MIL/uL   Hemoglobin 14.7  12.0 - 15.0 g/dL   HCT 45.5  36.0 - 46.0 %   MCV 96.6  78.0 - 100.0 fL   MCH 31.2  26.0 - 34.0 pg   MCHC 32.3  30.0 - 36.0 g/dL   RDW 12.6  11.5 - 15.5 %   Platelets 353  150 - 400 K/uL   Neutrophils Relative % 81 (*) 43 - 77 %   Neutro Abs 10.4 (*) 1.7  - 7.7 K/uL   Lymphocytes Relative 9 (*) 12 - 46 %   Lymphs Abs 1.2  0.7 - 4.0 K/uL   Monocytes Relative 9  3 - 12 %   Monocytes Absolute 1.1 (*) 0.1 - 1.0 K/uL   Eosinophils Relative 1  0 - 5 %   Eosinophils Absolute 0.1  0.0 - 0.7 K/uL   Basophils Relative 0  0 - 1 %   Basophils Absolute 0.0  0.0 - 0.1 K/uL  BASIC METABOLIC PANEL     Status: Abnormal   Collection Time    05/31/13  8:00 PM      Result Value Range   Sodium 141  137 - 147 mEq/L   Comment: Please note change in reference range.   Potassium 3.9  3.7 - 5.3 mEq/L   Comment: Please  note change in reference range.   Chloride 95 (*) 96 - 112 mEq/L   CO2 30  19 - 32 mEq/L   Glucose, Bld 96  70 - 99 mg/dL   BUN 13  6 - 23 mg/dL   Creatinine, Ser 0.63  0.50 - 1.10 mg/dL   Calcium 10.2  8.4 - 10.5 mg/dL   GFR calc non Af Amer 88 (*) >90 mL/min   GFR calc Af Amer >90  >90 mL/min   Comment: (NOTE)     The eGFR has been calculated using the CKD EPI equation.     This calculation has not been validated in all clinical situations.     eGFR's persistently <90 mL/min signify possible Chronic Kidney     Disease.  URINALYSIS, ROUTINE W REFLEX MICROSCOPIC     Status: Abnormal   Collection Time    05/31/13  8:58 PM      Result Value Range   Color, Urine YELLOW  YELLOW   APPearance CLEAR  CLEAR   Specific Gravity, Urine 1.025  1.005 - 1.030   pH 8.5 (*) 5.0 - 8.0   Glucose, UA NEGATIVE  NEGATIVE mg/dL   Hgb urine dipstick TRACE (*) NEGATIVE   Bilirubin Urine NEGATIVE  NEGATIVE   Ketones, ur 15 (*) NEGATIVE mg/dL   Protein, ur NEGATIVE  NEGATIVE mg/dL   Urobilinogen, UA 0.2  0.0 - 1.0 mg/dL   Nitrite NEGATIVE  NEGATIVE   Leukocytes, UA NEGATIVE  NEGATIVE  URINE MICROSCOPIC-ADD ON     Status: None   Collection Time    05/31/13  8:58 PM      Result Value Range   RBC / HPF 0-2  <3 RBC/hpf  TROPONIN I     Status: None   Collection Time    05/31/13  9:54 PM      Result Value Range   Troponin I <0.30  <0.30 ng/mL   Comment:             Due to the release kinetics of cTnI,     a negative result within the first hours     of the onset of symptoms does not rule out     myocardial infarction with certainty.     If myocardial infarction is still suspected,     repeat the test at appropriate intervals.  LACTIC ACID, PLASMA     Status: None   Collection Time    05/31/13  9:54 PM      Result Value Range   Lactic Acid, Venous 1.0  0.5 - 2.2 mmol/L    Radiology Reports: Ct Abdomen Pelvis W Contrast  05/31/2013   CLINICAL DATA:  Vomiting and epigastric pain  EXAM: CT ABDOMEN AND PELVIS WITH CONTRAST  TECHNIQUE: Multidetector CT imaging of the abdomen and pelvis was performed using the standard protocol following bolus administration of intravenous contrast.  CONTRAST:  15m OMNIPAQUE IOHEXOL 300 MG/ML SOLN, 1052mOMNIPAQUE IOHEXOL 300 MG/ML SOLN  COMPARISON:  Prior radiograph from earlier the same day and CT from 12/10/2010  FINDINGS: Linear atelectasis and/ present within the lingula.  The liver demonstrates a normal contrast enhanced appearance. Gallbladder is within normal limits. No biliary ductal dilatation. The spleen, adrenal glands, and pancreas demonstrate a normal contrast enhanced appearance.  The kidneys demonstrate symmetric enhancement. No nephrolithiasis, hydronephrosis, or focal enhancing renal mass.  Multiple dilated fluid-filled loops of small bowel are seen throughout the upper, mid, and lower abdomen. These measure up to 3.5 cm in  diameter. Scattered fluid fluid levels are present. There is an apparent transition point within the lower mid pelvis (series 2, image 62). The small bowel is decompressed distally. The colon is largely decompressed. Small amount inflammatory fat stranding seen about these dilated loops of bowel. Findings are consist palpable with small bowel obstruction.  A left inguinal hernia is present containing fat and vessels. There is the hernia sac measures approximately 4.5 x 3.4 cm. There is  inflammatory fat stranding with fluid density, suggestive of associated fat necrosis. No herniated loops of bowel seen within the hernia sac.  Bladder is within normal limits. Uterus is not visualized. Ovaries are not definitely visualized either.  No free intraperitoneal air. There is a small amount of free perihepatic fluid.  No pathologically enlarged intra-abdominal pelvic lymph nodes. Scattered calcified and noncalcified atheromatous disease seen throughout the intra-abdominal aorta and its branch vessels.  No acute osseous abnormality. No focal lytic or blastic osseous lesions. Moderate degenerative disc disease noted within the lumbar spine.  IMPRESSION: 1. Findings compatible with small bowel obstruction with transition point in the lower mid pelvis. Finding is likely related to adhesive disease. 2. Fat and vessel containing left inguinal hernia with associated inflammatory changes, suggestive of fat necrosis. 3. Small volume free fluid adjacent to the liver, thought to be related to underlying obstructive process.   Electronically Signed   By: Jeannine Boga M.D.   On: 05/31/2013 23:27   Dg Abd Acute W/chest  05/31/2013   CLINICAL DATA:  Abdominal pain  EXAM: ACUTE ABDOMEN SERIES (ABDOMEN 2 VIEW & CHEST 1 VIEW)  COMPARISON:  Abdominal CT 12/10/2010  FINDINGS: Markedly dilated loops of central small bowel, with evidence of fold thickening. The loops measure up to 5 cm in diameter and show fluid levels. No evidence of pneumatosis or bowel perforation. Abdominal CT is currently pending.  No infiltrate, edema, effusion, or pneumothorax. Suspect biapical calcified pleural plaques. Normal heart size.  No acute osseous findings. Stable sclerotic focus in the right femoral head, consistent with bone island.  IMPRESSION: High-grade small bowel obstruction. No evidence of bowel perforation.   Electronically Signed   By: Jorje Guild M.D.   On: 05/31/2013 21:56    Problem List  Principal Problem:    SBO (small bowel obstruction) Active Problems:   Dehydration   Assessment: This is a 73 year old, Caucasian female, who presents with nausea, vomiting, and abdominal pain. She has evidence for small bowel obstruction. She also has a left-sided inguinal hernia. She's a former cigarette smoker and has been diagnosed with COPD in the past.  Plan: #1 small bowel obstruction: She has had an NG tube placed. We'll continue same. She'll kept n.p.o. She'll be given IV fluids. Pain medications and antiemetics as needed. She'll be admitted to the hospital. General surgery has been consulted and they will see the patient in the morning.  #2 Left-sided inguinal hernia: Some fat necrosis seen on CT. She's not particularly tender in that area. We will let general surgery manage this issue as well.  #3 history of tobacco abuse in the past along with history of COPD: Not an active issue currently. Continue to monitor   DVT Prophylaxis: Lovenox Code Status: Full code Family Communication: Discussed with the patient and her husband  Disposition Plan: Admit to MedSurg   Further management decisions will depend on results of further testing and patient's response to treatment.  [facility]  [facility] Pager [phone]  If 7PM-7AM, please contact night-coverage [email or web address] Password [email or web address]  06/01/2013, 1:06 AM

## 2013-06-01 NOTE — ED Provider Notes (Signed)
PT left at change of shift to get CT results. Dr. Alvino Chapel reports a reducible left inguinal hernia and chest x-ray showing possible SBO.  Patient and spouse given results of her CT scan and need for NG placement and admission to the hospital.  00:04 Dr Arnoldo Morale wants NG to be placed.   00:12 Dr Maryland Pink admit to med-surg   Ct Abdomen Pelvis W Contrast  05/31/2013   CLINICAL DATA:  Vomiting and epigastric pain  EXAM: CT ABDOMEN AND PELVIS WITH CONTRAST  TECHNIQUE: Multidetector CT imaging of the abdomen and pelvis was performed using the standard protocol following bolus administration of intravenous contrast.  CONTRAST:  6mL OMNIPAQUE IOHEXOL 300 MG/ML SOLN, 146mL OMNIPAQUE IOHEXOL 300 MG/ML SOLN  COMPARISON:  Prior radiograph from earlier the same day and CT from 12/10/2010  FINDINGS: Linear atelectasis and/ present within the lingula.  The liver demonstrates a normal contrast enhanced appearance. Gallbladder is within normal limits. No biliary ductal dilatation. The spleen, adrenal glands, and pancreas demonstrate a normal contrast enhanced appearance.  The kidneys demonstrate symmetric enhancement. No nephrolithiasis, hydronephrosis, or focal enhancing renal mass.  Multiple dilated fluid-filled loops of small bowel are seen throughout the upper, mid, and lower abdomen. These measure up to 3.5 cm in diameter. Scattered fluid fluid levels are present. There is an apparent transition point within the lower mid pelvis (series 2, image 62). The small bowel is decompressed distally. The colon is largely decompressed. Small amount inflammatory fat stranding seen about these dilated loops of bowel. Findings are consist palpable with small bowel obstruction.  A left inguinal hernia is present containing fat and vessels. There is the hernia sac measures approximately 4.5 x 3.4 cm. There is inflammatory fat stranding with fluid density, suggestive of associated fat necrosis. No herniated loops of bowel seen  within the hernia sac.  Bladder is within normal limits. Uterus is not visualized. Ovaries are not definitely visualized either.  No free intraperitoneal air. There is a small amount of free perihepatic fluid.  No pathologically enlarged intra-abdominal pelvic lymph nodes. Scattered calcified and noncalcified atheromatous disease seen throughout the intra-abdominal aorta and its branch vessels.  No acute osseous abnormality. No focal lytic or blastic osseous lesions. Moderate degenerative disc disease noted within the lumbar spine.  IMPRESSION: 1. Findings compatible with small bowel obstruction with transition point in the lower mid pelvis. Finding is likely related to adhesive disease. 2. Fat and vessel containing left inguinal hernia with associated inflammatory changes, suggestive of fat necrosis. 3. Small volume free fluid adjacent to the liver, thought to be related to underlying obstructive process.   Electronically Signed   By: Jeannine Boga M.D.   On: 05/31/2013 23:27    Final diagnoses:  SBO (small bowel obstruction)  Inguinal hernia, left    Plan admission   Rolland Porter, MD, Alanson Aly, MD 06/01/13 [phone]

## 2013-06-02 ENCOUNTER — Ambulatory Visit: Payer: Medicare Other | Admitting: Nurse Practitioner

## 2013-06-02 DIAGNOSIS — R109 Unspecified abdominal pain: Secondary | ICD-10-CM | POA: Diagnosis not present

## 2013-06-02 DIAGNOSIS — E860 Dehydration: Secondary | ICD-10-CM | POA: Diagnosis not present

## 2013-06-02 DIAGNOSIS — K4090 Unilateral inguinal hernia, without obstruction or gangrene, not specified as recurrent: Secondary | ICD-10-CM | POA: Diagnosis not present

## 2013-06-02 DIAGNOSIS — K56609 Unspecified intestinal obstruction, unspecified as to partial versus complete obstruction: Secondary | ICD-10-CM | POA: Diagnosis not present

## 2013-06-02 MED ORDER — ZOLPIDEM TARTRATE 5 MG PO TABS
5.0000 mg | ORAL_TABLET | Freq: Once | ORAL | Status: AC
  Administered 2013-06-02: 5 mg via ORAL
  Filled 2013-06-02: qty 1

## 2013-06-02 NOTE — Progress Notes (Signed)
[facility] PROGRESS NOTE  Terri Harris SWN:[id number] DOB: 04-29-1941 DOA: 05/31/2013 PCP: Sallee Lange, MD  Assessment/Plan: 1. Small bowel obstruction. Clinically appears to be improving. NG tube was clamped. We'll start the patient on clear liquids. Provide Dulcolax suppositories to stimulate bowel function. Encourage ambulation. Surgery following. 2. Dehydration. Improved with IV fluids  3. Left inguinal hernia with associated changes suggestive of fat necrosis. Defer to surgery.  Code Status: NAD Family Communication: discussed with patient Disposition Plan: discharge home once improved   Consultants:  Surgery, Dr. Arnoldo Morale  Procedures:  none  Antibiotics:  none  HPI/Subjective: No nausea or vomiting, overall feeling better.  NG tube was clamped off. Last BM was yesterday.  Passing flatus  Objective: Filed Vitals:   06/02/13 0000  BP:   Pulse: 75  Temp:   Resp:     Intake/Output Summary (Last 24 hours) at 06/02/13 0941 Last data filed at 06/02/13 0200  Gross per 24 hour  Intake   2200 ml  Output    850 ml  Net   1350 ml   Filed Weights   05/31/13 1747 06/01/13 0151  Weight: 63.504 kg (140 lb) 63.3 kg (139 lb 8.8 oz)    Exam:   General:  NAD  Cardiovascular: S1, S2 RRR  Respiratory: CTA B  Abdomen: soft, nt, bs+  Musculoskeletal: no edema b/l   Data Reviewed: Basic Metabolic Panel:  Recent Labs Lab 05/31/13 2000 06/01/13 0507  NA 141 140  K 3.9 3.9  CL 95* 99  CO2 30 29  GLUCOSE 96 104*  BUN 13 12  CREATININE 0.63 0.65  CALCIUM 10.2 9.1   Liver Function Tests:  Recent Labs Lab 06/01/13 0507  AST 18  ALT 10  ALKPHOS 86  BILITOT 0.3  PROT 7.4  ALBUMIN 3.5   No results found for this basename: LIPASE, AMYLASE,  in the last 168 hours No results found for this basename: AMMONIA,  in the last 168 hours CBC:  Recent Labs Lab 05/31/13 2000 06/01/13 0507  WBC 12.9* 9.3  NEUTROABS 10.4*  --   HGB 14.7 13.3  HCT 45.5  40.7  MCV 96.6 97.1  PLT 353 305   Cardiac Enzymes:  Recent Labs Lab 05/31/13 2154  TROPONINI <0.30   BNP (last 3 results) No results found for this basename: PROBNP,  in the last 8760 hours CBG: No results found for this basename: GLUCAP,  in the last 168 hours  Recent Results (from the past 240 hour(s))  MRSA PCR SCREENING     Status: None   Collection Time    06/01/13  1:13 AM      Result Value Range Status   MRSA by PCR NEGATIVE  NEGATIVE Final   Comment:            The GeneXpert MRSA Assay (FDA     approved for NASAL specimens     only), is one component of a     comprehensive MRSA colonization     surveillance program. It is not     intended to diagnose MRSA     infection nor to guide or     monitor treatment for     MRSA infections.     Studies: Ct Abdomen Pelvis W Contrast  05/31/2013   CLINICAL DATA:  Vomiting and epigastric pain  EXAM: CT ABDOMEN AND PELVIS WITH CONTRAST  TECHNIQUE: Multidetector CT imaging of the abdomen and pelvis was performed using the standard protocol following bolus administration of intravenous  contrast.  CONTRAST:  40mL OMNIPAQUE IOHEXOL 300 MG/ML SOLN, 184mL OMNIPAQUE IOHEXOL 300 MG/ML SOLN  COMPARISON:  Prior radiograph from earlier the same day and CT from 12/10/2010  FINDINGS: Linear atelectasis and/ present within the lingula.  The liver demonstrates a normal contrast enhanced appearance. Gallbladder is within normal limits. No biliary ductal dilatation. The spleen, adrenal glands, and pancreas demonstrate a normal contrast enhanced appearance.  The kidneys demonstrate symmetric enhancement. No nephrolithiasis, hydronephrosis, or focal enhancing renal mass.  Multiple dilated fluid-filled loops of small bowel are seen throughout the upper, mid, and lower abdomen. These measure up to 3.5 cm in diameter. Scattered fluid fluid levels are present. There is an apparent transition point within the lower mid pelvis (series 2, image 62). The small  bowel is decompressed distally. The colon is largely decompressed. Small amount inflammatory fat stranding seen about these dilated loops of bowel. Findings are consist palpable with small bowel obstruction.  A left inguinal hernia is present containing fat and vessels. There is the hernia sac measures approximately 4.5 x 3.4 cm. There is inflammatory fat stranding with fluid density, suggestive of associated fat necrosis. No herniated loops of bowel seen within the hernia sac.  Bladder is within normal limits. Uterus is not visualized. Ovaries are not definitely visualized either.  No free intraperitoneal air. There is a small amount of free perihepatic fluid.  No pathologically enlarged intra-abdominal pelvic lymph nodes. Scattered calcified and noncalcified atheromatous disease seen throughout the intra-abdominal aorta and its branch vessels.  No acute osseous abnormality. No focal lytic or blastic osseous lesions. Moderate degenerative disc disease noted within the lumbar spine.  IMPRESSION: 1. Findings compatible with small bowel obstruction with transition point in the lower mid pelvis. Finding is likely related to adhesive disease. 2. Fat and vessel containing left inguinal hernia with associated inflammatory changes, suggestive of fat necrosis. 3. Small volume free fluid adjacent to the liver, thought to be related to underlying obstructive process.   Electronically Signed   By: Jeannine Boga M.D.   On: 05/31/2013 23:27   Dg Abd Acute W/chest  05/31/2013   CLINICAL DATA:  Abdominal pain  EXAM: ACUTE ABDOMEN SERIES (ABDOMEN 2 VIEW & CHEST 1 VIEW)  COMPARISON:  Abdominal CT 12/10/2010  FINDINGS: Markedly dilated loops of central small bowel, with evidence of fold thickening. The loops measure up to 5 cm in diameter and show fluid levels. No evidence of pneumatosis or bowel perforation. Abdominal CT is currently pending.  No infiltrate, edema, effusion, or pneumothorax. Suspect biapical calcified  pleural plaques. Normal heart size.  No acute osseous findings. Stable sclerotic focus in the right femoral head, consistent with bone island.  IMPRESSION: High-grade small bowel obstruction. No evidence of bowel perforation.   Electronically Signed   By: Jorje Guild M.D.   On: 05/31/2013 21:56    Scheduled Meds: . pantoprazole (PROTONIX) IV  40 mg Intravenous Q24H   Continuous Infusions: . sodium chloride 100 mL/hr at 06/02/13 0200    Principal Problem:   SBO (small bowel obstruction) Active Problems:   Dehydration    Time spent: 48mins    Terri Harris  [facility] Pager [phone]. If 7PM-7AM, please contact night-coverage at [email or web address], password [facility] 06/02/2013, 9:41 AM  LOS: 2 days

## 2013-06-02 NOTE — Progress Notes (Signed)
Subjective: Abdominal pain significantly decreased. Has had some belching, minimal flatus.  Objective: Vital signs in last 24 hours: Temp:  [97.6 F (36.4 C)-98.5 F (36.9 C)] 98.2 F (36.8 C) (01/05 1200) Pulse Rate:  [73-76] 76 (01/05 1000) Resp:  [18-20] 20 (01/05 1000) BP: (138-152)/(65-76) 138/65 mmHg (01/05 1000) SpO2:  [92 %-97 %] 95 % (01/05 0000) Last BM Date: 06/01/13  Intake/Output from previous day: 01/04 0701 - 01/05 0700 In: 2350 [I.V.:2000; NG/GT:350] Out: 1050 [Urine:900; Emesis/NG output:150] Intake/Output this shift: Total I/O In: 120 [P.O.:120] Out: 650 [Urine:650]  General appearance: alert, cooperative and no distress GI: Soft, not particularly distended. Minimal bowel sounds appreciated. No tenderness noted.  Lab Results:   Recent Labs  05/31/13 2000 06/01/13 0507  WBC 12.9* 9.3  HGB 14.7 13.3  HCT 45.5 40.7  PLT 353 305   BMET  Recent Labs  05/31/13 2000 06/01/13 0507  NA 141 140  K 3.9 3.9  CL 95* 99  CO2 30 29  GLUCOSE 96 104*  BUN 13 12  CREATININE 0.63 0.65  CALCIUM 10.2 9.1   PT/INR No results found for this basename: LABPROT, INR,  in the last 72 hours  Studies/Results: Ct Abdomen Pelvis W Contrast  05/31/2013   CLINICAL DATA:  Vomiting and epigastric pain  EXAM: CT ABDOMEN AND PELVIS WITH CONTRAST  TECHNIQUE: Multidetector CT imaging of the abdomen and pelvis was performed using the standard protocol following bolus administration of intravenous contrast.  CONTRAST:  13mL OMNIPAQUE IOHEXOL 300 MG/ML SOLN, 126mL OMNIPAQUE IOHEXOL 300 MG/ML SOLN  COMPARISON:  Prior radiograph from earlier the same day and CT from 12/10/2010  FINDINGS: Linear atelectasis and/ present within the lingula.  The liver demonstrates a normal contrast enhanced appearance. Gallbladder is within normal limits. No biliary ductal dilatation. The spleen, adrenal glands, and pancreas demonstrate a normal contrast enhanced appearance.  The kidneys demonstrate  symmetric enhancement. No nephrolithiasis, hydronephrosis, or focal enhancing renal mass.  Multiple dilated fluid-filled loops of small bowel are seen throughout the upper, mid, and lower abdomen. These measure up to 3.5 cm in diameter. Scattered fluid fluid levels are present. There is an apparent transition point within the lower mid pelvis (series 2, image 62). The small bowel is decompressed distally. The colon is largely decompressed. Small amount inflammatory fat stranding seen about these dilated loops of bowel. Findings are consist palpable with small bowel obstruction.  A left inguinal hernia is present containing fat and vessels. There is the hernia sac measures approximately 4.5 x 3.4 cm. There is inflammatory fat stranding with fluid density, suggestive of associated fat necrosis. No herniated loops of bowel seen within the hernia sac.  Bladder is within normal limits. Uterus is not visualized. Ovaries are not definitely visualized either.  No free intraperitoneal air. There is a small amount of free perihepatic fluid.  No pathologically enlarged intra-abdominal pelvic lymph nodes. Scattered calcified and noncalcified atheromatous disease seen throughout the intra-abdominal aorta and its branch vessels.  No acute osseous abnormality. No focal lytic or blastic osseous lesions. Moderate degenerative disc disease noted within the lumbar spine.  IMPRESSION: 1. Findings compatible with small bowel obstruction with transition point in the lower mid pelvis. Finding is likely related to adhesive disease. 2. Fat and vessel containing left inguinal hernia with associated inflammatory changes, suggestive of fat necrosis. 3. Small volume free fluid adjacent to the liver, thought to be related to underlying obstructive process.   Electronically Signed   By: Pincus Badder.D.  On: 05/31/2013 23:27   Dg Abd Acute W/chest  05/31/2013   CLINICAL DATA:  Abdominal pain  EXAM: ACUTE ABDOMEN SERIES (ABDOMEN 2 VIEW  & CHEST 1 VIEW)  COMPARISON:  Abdominal CT 12/10/2010  FINDINGS: Markedly dilated loops of central small bowel, with evidence of fold thickening. The loops measure up to 5 cm in diameter and show fluid levels. No evidence of pneumatosis or bowel perforation. Abdominal CT is currently pending.  No infiltrate, edema, effusion, or pneumothorax. Suspect biapical calcified pleural plaques. Normal heart size.  No acute osseous findings. Stable sclerotic focus in the right femoral head, consistent with bone island.  IMPRESSION: High-grade small bowel obstruction. No evidence of bowel perforation.   Electronically Signed   By: Jorje Guild M.D.   On: 05/31/2013 21:56    Anti-infectives: Anti-infectives   None      Assessment/Plan: Impression: Small bowel obstruction, slowly resolving. Plan: NG tube was clamped for now. When she has a bowel movement, this can be removed. No need for acute surgical intervention. We'll continue to follow with you.  LOS: 2 days    Prudie Guthridge A 06/02/2013

## 2013-06-02 NOTE — Care Management Note (Addendum)
    Page 1 of 1   06/04/2013     3:02:53 PM   CARE MANAGEMENT NOTE 06/04/2013  Patient:  Terri Harris, Terri Harris   Account Number:  [id number]  Date Initiated:  06/02/2013  Documentation initiated by:  Theophilus Kinds  Subjective/Objective Assessment:   Pt admitted from home with SBO. Pt lives with her husband and will return home at discharge. Pt is independent with ADL's.     Action/Plan:   No CM needs noted.   Anticipated DC Date:  06/05/2013   Anticipated DC Plan:  [place]  CM consult      Choice offered to / List presented to:             Status of service:  Completed, signed off Medicare Important Message given?  YES (If response is "NO", the following Medicare IM given date fields will be blank) Date Medicare IM given:  06/04/2013 Date Additional Medicare IM given:    Discharge Disposition:  HOME/SELF CARE  Per UR Regulation:    If discussed at Long Length of Stay Meetings, dates discussed:    Comments:  06/04/13 [place], RN BSN CM Pt discharged home today. No CM needs noted.  06/02/13 [place], RN BSN CM

## 2013-06-02 NOTE — Progress Notes (Signed)
UR chart review completed.  

## 2013-06-03 DIAGNOSIS — K56609 Unspecified intestinal obstruction, unspecified as to partial versus complete obstruction: Secondary | ICD-10-CM | POA: Diagnosis not present

## 2013-06-03 DIAGNOSIS — E860 Dehydration: Secondary | ICD-10-CM | POA: Diagnosis not present

## 2013-06-03 DIAGNOSIS — R109 Unspecified abdominal pain: Secondary | ICD-10-CM | POA: Diagnosis not present

## 2013-06-03 MED ORDER — ZOLPIDEM TARTRATE 5 MG PO TABS
5.0000 mg | ORAL_TABLET | Freq: Every evening | ORAL | Status: DC | PRN
  Administered 2013-06-03: 5 mg via ORAL
  Filled 2013-06-03: qty 1

## 2013-06-03 MED ORDER — MAGNESIUM HYDROXIDE 400 MG/5ML PO SUSP
30.0000 mL | Freq: Two times a day (BID) | ORAL | Status: DC
  Administered 2013-06-03 (×2): 30 mL via ORAL
  Filled 2013-06-03 (×2): qty 30

## 2013-06-03 NOTE — Progress Notes (Signed)
[facility] PROGRESS NOTE  Terri Harris RSW:[id number] DOB: 07/29/1940 DOA: 05/31/2013 PCP: Sallee Lange, MD  Assessment/Plan: 1. Small bowel obstruction. Clinically appears to be improving. NG tube has been discontinued. She is tolerating clear liquids and has been started on full liquids. If she continues to improve, will try soft diet tomorrow.. Encourage ambulation. Surgery following. 2. Dehydration. Improved with IV fluids  3. Left inguinal hernia with associated changes suggestive of fat necrosis. Defer to surgery.  Code Status: NAD Family Communication: discussed with patient Disposition Plan: discharge home once improved, possibly tomorrow   Consultants:  Surgery, Dr. Arnoldo Morale  Procedures:  none  Antibiotics:  none  HPI/Subjective: Feeling better, tolerating liquids.  Passing stool  Objective: Filed Vitals:   06/03/13 1358  BP: 114/59  Pulse: 81  Temp: 98 F (36.7 C)  Resp: 20    Intake/Output Summary (Last 24 hours) at 06/03/13 2016 Last data filed at 06/03/13 1827  Gross per 24 hour  Intake   4765 ml  Output    300 ml  Net   4465 ml   Filed Weights   05/31/13 1747 06/01/13 0151  Weight: 63.504 kg (140 lb) 63.3 kg (139 lb 8.8 oz)    Exam:   General:  NAD  Cardiovascular: S1, S2 RRR  Respiratory: CTA B  Abdomen: soft, nt, bs+  Musculoskeletal: no edema b/l   Data Reviewed: Basic Metabolic Panel:  Recent Labs Lab 05/31/13 2000 06/01/13 0507  NA 141 140  K 3.9 3.9  CL 95* 99  CO2 30 29  GLUCOSE 96 104*  BUN 13 12  CREATININE 0.63 0.65  CALCIUM 10.2 9.1   Liver Function Tests:  Recent Labs Lab 06/01/13 0507  AST 18  ALT 10  ALKPHOS 86  BILITOT 0.3  PROT 7.4  ALBUMIN 3.5   No results found for this basename: LIPASE, AMYLASE,  in the last 168 hours No results found for this basename: AMMONIA,  in the last 168 hours CBC:  Recent Labs Lab 05/31/13 2000 06/01/13 0507  WBC 12.9* 9.3  NEUTROABS 10.4*  --   HGB  14.7 13.3  HCT 45.5 40.7  MCV 96.6 97.1  PLT 353 305   Cardiac Enzymes:  Recent Labs Lab 05/31/13 2154  TROPONINI <0.30   BNP (last 3 results) No results found for this basename: PROBNP,  in the last 8760 hours CBG: No results found for this basename: GLUCAP,  in the last 168 hours  Recent Results (from the past 240 hour(s))  MRSA PCR SCREENING     Status: None   Collection Time    06/01/13  1:13 AM      Result Value Range Status   MRSA by PCR NEGATIVE  NEGATIVE Final   Comment:            The GeneXpert MRSA Assay (FDA     approved for NASAL specimens     only), is one component of a     comprehensive MRSA colonization     surveillance program. It is not     intended to diagnose MRSA     infection nor to guide or     monitor treatment for     MRSA infections.     Studies: No results found.  Scheduled Meds: . magnesium hydroxide  30 mL Oral BID  . pantoprazole (PROTONIX) IV  40 mg Intravenous Q24H   Continuous Infusions: . sodium chloride 100 mL/hr at 06/03/13 1843    Principal Problem:   SBO (  small bowel obstruction) Active Problems:   Dehydration    Time spent: 13mins    Bleu Moisan  [facility] Pager [phone]. If 7PM-7AM, please contact night-coverage at [email or web address], password [facility] 06/03/2013, 8:16 PM  LOS: 3 days

## 2013-06-03 NOTE — Progress Notes (Signed)
  Subjective: Had small flatus. No significant emesis with clamping of NG tube.  Objective: Vital signs in last 24 hours: Temp:  [97.6 F (36.4 C)-98.6 F (37 C)] 98.6 F (37 C) (01/06 0556) Pulse Rate:  [76-78] 78 (01/06 0556) Resp:  [20] 20 (01/06 0556) BP: (138-169)/(65-78) 158/74 mmHg (01/06 0556) SpO2:  [91 %-96 %] 96 % (01/06 0556) Last BM Date: 06/01/13  Intake/Output from previous day: 01/05 0701 - 01/06 0700 In: 720 [P.O.:720] Out: 1650 [Urine:1650] Intake/Output this shift:    General appearance: alert, cooperative and no distress GI: Soft, nontender, nondistended. Occasional bowel sounds appreciated.  Lab Results:   Recent Labs  05/31/13 2000 06/01/13 0507  WBC 12.9* 9.3  HGB 14.7 13.3  HCT 45.5 40.7  PLT 353 305   BMET  Recent Labs  05/31/13 2000 06/01/13 0507  NA 141 140  K 3.9 3.9  CL 95* 99  CO2 30 29  GLUCOSE 96 104*  BUN 13 12  CREATININE 0.63 0.65  CALCIUM 10.2 9.1   PT/INR No results found for this basename: LABPROT, INR,  in the last 72 hours  Studies/Results: No results found.  Anti-infectives: Anti-infectives   None      Assessment/Plan: Impression: Small bowel obstruction, resolving Plan: We'll remove NG tube and advance diet as tolerated. No need for acute surgical intervention.  LOS: 3 days    Rajat Staver A 06/03/2013

## 2013-06-03 NOTE — Plan of Care (Signed)
Problem: Phase III Progression Outcomes Goal: Activity at appropriate level-compared to baseline (UP IN CHAIR FOR HEMODIALYSIS)  Outcome: Completed/Met Date Met:  06/03/13 Ambulating in hallway without difficulty.

## 2013-06-04 DIAGNOSIS — K2990 Gastroduodenitis, unspecified, without bleeding: Secondary | ICD-10-CM

## 2013-06-04 DIAGNOSIS — K56609 Unspecified intestinal obstruction, unspecified as to partial versus complete obstruction: Secondary | ICD-10-CM | POA: Diagnosis not present

## 2013-06-04 DIAGNOSIS — E860 Dehydration: Secondary | ICD-10-CM | POA: Diagnosis not present

## 2013-06-04 DIAGNOSIS — K2970 Gastritis, unspecified, without bleeding: Secondary | ICD-10-CM

## 2013-06-04 DIAGNOSIS — R109 Unspecified abdominal pain: Secondary | ICD-10-CM | POA: Diagnosis not present

## 2013-06-04 MED ORDER — PANTOPRAZOLE SODIUM 40 MG PO TBEC: 40.0000 mg | DELAYED_RELEASE_TABLET | Freq: Two times a day (BID) | ORAL | Status: DC

## 2013-06-04 NOTE — Discharge Summary (Signed)
Physician Discharge Summary  Terri Harris [id number] DOB: [birth year] DOA: 05/31/2013  PCP: Sallee Lange, MD  Admit date: 05/31/2013 Discharge date: 06/04/2013  Time spent: 40 minutes  Recommendations for Outpatient Follow-up:  1. Follow up with primary care doctor in 2 weeks  Discharge Diagnoses:  Principal Problem:   SBO (small bowel obstruction) Active Problems:   Dehydration   Discharge Condition: improved  Diet recommendation: low salt  Filed Weights   05/31/13 1747 06/01/13 0151  Weight: 63.504 kg (140 lb) 63.3 kg (139 lb 8.8 oz)    History of present illness:  Terri Harris is a 73 y.o. female with a past medical history that is unremarkable except for remote history of cervical versus uterine cancer for which she underwent a partial hysterectomy almost 40 years ago. She tells me that she was in her usual state of health till this past Monday when she started feeling sick. She started having central abdominal pain, cramping in nature, without any radiation, and was 10 out of 10 in intensity. She had no fever or had some chills. She then started having vomiting. She has thrown up about 5-6 times bilious material without any blood in it. She had a small bowel movement this morning and now she is struggling to pass gas. She has noticed that her bowel habits have changed in the last few months and that she has a sudden urgency in the early morning to go to the bathroom. Denies any blood in the stools. She tells me also that lately, that she's been having some stomach problems. Back in November she had an episode of abdominal bloating for which she was seen by her primary care physician and was prescribed a PPI and given some antispasmodics. And that improved subsequently. She had again an episode of nausea, vomiting in December, which also subsided with medications. She's never had small bowel obstruction before. She had a colonoscopy in May of 2013, which revealed a flat polyp in the  ascending colon. Two small polyps were ablated from the splenic flexure.   Hospital course:  This patient was admitted to the hospital with vomiting and abdominal pain. She was found to have a small bowel obstruction. The patient had NG tube placed and was treated conservatively with bowel rest. General surgery was consulted and followed throughout her hospital course. As her clinical condition improved, the NG tube was subsequently discontinued and she was started on clear liquids. This was advanced to a solid diet. Patient did not have any further vomiting or abdominal pain. She is moving her bowels and passing flatus. The patient is ready for discharge home. She'll follow up with primary care physician 2 weeks  Procedures:  none  Consultations:  General Surgery  Discharge Exam: Filed Vitals:   06/04/13 1432  BP: 121/82  Pulse: 80  Temp: 97.6 F (36.4 C)  Resp: 20    General: NAD Cardiovascular: S1, S2 RRR Respiratory: CTA B  Discharge Instructions  Discharge Orders   Future Orders Complete By Expires   Diet - low sodium heart healthy  As directed    Increase activity slowly  As directed        Medication List    STOP taking these medications       ibuprofen 200 MG tablet  Commonly known as:  ADVIL,MOTRIN      TAKE these medications       acetaminophen 500 MG tablet  Commonly known as:  TYLENOL  Take 1,000 mg by mouth every  6 (six) hours as needed. Pain     pantoprazole 40 MG tablet  Commonly known as:  PROTONIX  Take 1 tablet (40 mg total) by mouth 2 (two) times daily. For acid reflux       Allergies  Allergen Reactions  . Codeine Nausea And Vomiting       Follow-up Information   Follow up with LUKING,SCOTT, MD. Schedule an appointment as soon as possible for a visit in 2 weeks.   Specialty:  Family Medicine   Contact information:   [address]  [place] [address] [phone]        The results of significant diagnostics  from this hospitalization (including imaging, microbiology, ancillary and laboratory) are listed below for reference.    Significant Diagnostic Studies: Ct Abdomen Pelvis W Contrast  05/31/2013   CLINICAL DATA:  Vomiting and epigastric pain  EXAM: CT ABDOMEN AND PELVIS WITH CONTRAST  TECHNIQUE: Multidetector CT imaging of the abdomen and pelvis was performed using the standard protocol following bolus administration of intravenous contrast.  CONTRAST:  38mL OMNIPAQUE IOHEXOL 300 MG/ML SOLN, 181mL OMNIPAQUE IOHEXOL 300 MG/ML SOLN  COMPARISON:  Prior radiograph from earlier the same day and CT from 12/10/2010  FINDINGS: Linear atelectasis and/ present within the lingula.  The liver demonstrates a normal contrast enhanced appearance. Gallbladder is within normal limits. No biliary ductal dilatation. The spleen, adrenal glands, and pancreas demonstrate a normal contrast enhanced appearance.  The kidneys demonstrate symmetric enhancement. No nephrolithiasis, hydronephrosis, or focal enhancing renal mass.  Multiple dilated fluid-filled loops of small bowel are seen throughout the upper, mid, and lower abdomen. These measure up to 3.5 cm in diameter. Scattered fluid fluid levels are present. There is an apparent transition point within the lower mid pelvis (series 2, image 62). The small bowel is decompressed distally. The colon is largely decompressed. Small amount inflammatory fat stranding seen about these dilated loops of bowel. Findings are consist palpable with small bowel obstruction.  A left inguinal hernia is present containing fat and vessels. There is the hernia sac measures approximately 4.5 x 3.4 cm. There is inflammatory fat stranding with fluid density, suggestive of associated fat necrosis. No herniated loops of bowel seen within the hernia sac.  Bladder is within normal limits. Uterus is not visualized. Ovaries are not definitely visualized either.  No free intraperitoneal air. There is a small amount  of free perihepatic fluid.  No pathologically enlarged intra-abdominal pelvic lymph nodes. Scattered calcified and noncalcified atheromatous disease seen throughout the intra-abdominal aorta and its branch vessels.  No acute osseous abnormality. No focal lytic or blastic osseous lesions. Moderate degenerative disc disease noted within the lumbar spine.  IMPRESSION: 1. Findings compatible with small bowel obstruction with transition point in the lower mid pelvis. Finding is likely related to adhesive disease. 2. Fat and vessel containing left inguinal hernia with associated inflammatory changes, suggestive of fat necrosis. 3. Small volume free fluid adjacent to the liver, thought to be related to underlying obstructive process.   Electronically Signed   By: Jeannine Boga M.D.   On: 05/31/2013 23:27   Dg Abd Acute W/chest  05/31/2013   CLINICAL DATA:  Abdominal pain  EXAM: ACUTE ABDOMEN SERIES (ABDOMEN 2 VIEW & CHEST 1 VIEW)  COMPARISON:  Abdominal CT 12/10/2010  FINDINGS: Markedly dilated loops of central small bowel, with evidence of fold thickening. The loops measure up to 5 cm in diameter and show fluid levels. No evidence of pneumatosis or bowel perforation. Abdominal  CT is currently pending.  No infiltrate, edema, effusion, or pneumothorax. Suspect biapical calcified pleural plaques. Normal heart size.  No acute osseous findings. Stable sclerotic focus in the right femoral head, consistent with bone island.  IMPRESSION: High-grade small bowel obstruction. No evidence of bowel perforation.   Electronically Signed   By: Jorje Guild M.D.   On: 05/31/2013 21:56    Microbiology: Recent Results (from the past 240 hour(s))  MRSA PCR SCREENING     Status: None   Collection Time    06/01/13  1:13 AM      Result Value Range Status   MRSA by PCR NEGATIVE  NEGATIVE Final   Comment:            The GeneXpert MRSA Assay (FDA     approved for NASAL specimens     only), is one component of a      comprehensive MRSA colonization     surveillance program. It is not     intended to diagnose MRSA     infection nor to guide or     monitor treatment for     MRSA infections.     Labs: Basic Metabolic Panel:  Recent Labs Lab 05/31/13 2000 06/01/13 0507  NA 141 140  K 3.9 3.9  CL 95* 99  CO2 30 29  GLUCOSE 96 104*  BUN 13 12  CREATININE 0.63 0.65  CALCIUM 10.2 9.1   Liver Function Tests:  Recent Labs Lab 06/01/13 0507  AST 18  ALT 10  ALKPHOS 86  BILITOT 0.3  PROT 7.4  ALBUMIN 3.5   No results found for this basename: LIPASE, AMYLASE,  in the last 168 hours No results found for this basename: AMMONIA,  in the last 168 hours CBC:  Recent Labs Lab 05/31/13 2000 06/01/13 0507  WBC 12.9* 9.3  NEUTROABS 10.4*  --   HGB 14.7 13.3  HCT 45.5 40.7  MCV 96.6 97.1  PLT 353 305   Cardiac Enzymes:  Recent Labs Lab 05/31/13 2154  TROPONINI <0.30   BNP: BNP (last 3 results) No results found for this basename: PROBNP,  in the last 8760 hours CBG: No results found for this basename: GLUCAP,  in the last 168 hours     Signed:  MEMON,JEHANZEB  [facility] 06/04/2013, 8:38 PM

## 2013-06-04 NOTE — Discharge Instructions (Signed)
Small Bowel Obstruction A small bowel obstruction is a blockage (obstruction) of the small intestine (small bowel). The small bowel is a long, slender tube that connects the stomach to the colon. Its job is to absorb nutrients from the fluids and foods you consume into the bloodstream.  CAUSES  There are many causes of intestinal blockage. The most common ones include:  Hernias. This is a more common cause in children than adults.  Inflammatory bowel disease (enteritis and colitis).  Twisting of the bowel (volvulus).  Tumors.  Scar tissue (adhesions) from previous surgery or radiation treatment.  Recent surgery. This may cause an acute small bowel obstruction called an ileus. SYMPTOMS   Abdominal pain. This may be dull cramps or sharp pain. It may occur in one area or may be present in the entire abdomen. Pain can range from mild to severe, depending on the degree of obstruction.  Nausea and vomiting. Vomit may be greenish or yellow bile color.  Distended or swollen stomach. Abdominal bloating is a common symptom.  Constipation.  Lack of passing gas.  Frequent belching.  Diarrhea. This may occur if runny stool is able to leak around the obstruction. DIAGNOSIS  Your caregiver can usually diagnose small bowel obstruction by taking a history, doing a physical exam, and taking X-rays. If the cause is unclear, a CT scan (computerized tomography) of your abdomen and pelvis may be needed. TREATMENT  Treatment of the blockage depends on the cause and how bad the problem is.   Sometimes, the obstruction improves with bed rest and intravenous (IV) fluids.  Resting the bowel is very important. This means following a simple diet. Sometimes, a clear liquid diet may be required for several days.  Sometimes, a small tube (nasogastric tube) is placed into the stomach to decompress the bowel. When the bowel is blocked, it usually swells up like a balloon filled with air and fluids.  Decompression means that the air and fluids are removed by suction through that tube. This can help with pain, discomfort, and nausea. It can also help the obstruction resolve faster.  Surgery may be required if other treatments do not work. Bowel obstruction from a hernia may require early surgery and can be an emergency procedure. Adhesions that cause frequent or severe obstructions may also require surgery. HOME CARE INSTRUCTIONS If your bowel obstruction is only partial or incomplete, you may be allowed to go home.  Get plenty of rest.  Follow your diet as directed by your caregiver.  Only consume clear liquids until your condition improves.  Avoid solid foods as instructed. SEEK IMMEDIATE MEDICAL CARE IF:  You have increased pain or cramping.  You vomit blood.  You have uncontrolled vomiting or nausea.  You cannot drink fluids due to vomiting or pain.  You develop confusion.  You begin feeling very dry or thirsty (dehydrated).  You have severe bloating.  You have chills.  You have a fever.  You feel extremely weak or you faint. MAKE SURE YOU:  Understand these instructions.  Will watch your condition.  Will get help right away if you are not doing well or get worse. Document Released: 08/01/2005 Document Revised: 08/07/2011 Document Reviewed: 07/29/2010 [employer] Patient Information 2014 [employer]  

## 2013-06-04 NOTE — Progress Notes (Signed)
Patient states understanding of discharge instructions.  

## 2013-06-04 NOTE — Progress Notes (Signed)
  Subjective: Patient had a large bowel movement this morning. Feels much better. Would like to go home.  Objective: Vital signs in last 24 hours: Temp:  [97.9 F (36.6 C)-98.2 F (36.8 C)] 97.9 F (36.6 C) (01/07 0554) Pulse Rate:  [66-81] 72 (01/07 0554) Resp:  [18-20] 20 (01/07 0554) BP: (114-133)/(59-70) 133/70 mmHg (01/07 0554) SpO2:  [94 %-95 %] 95 % (01/07 0554) Last BM Date: 06/04/13  Intake/Output from previous day: 01/06 0701 - 01/07 0700 In: 4765 [P.O.:720; I.V.:4045] Out: 600 [Urine:600] Intake/Output this shift:    General appearance: alert, cooperative and no distress GI: soft, non-tender; bowel sounds normal; no masses,  no organomegaly  Lab Results:  No results found for this basename: WBC, HGB, HCT, PLT,  in the last 72 hours BMET No results found for this basename: NA, K, CL, CO2, GLUCOSE, BUN, CREATININE, CALCIUM,  in the last 72 hours PT/INR No results found for this basename: LABPROT, INR,  in the last 72 hours  Studies/Results: No results found.  Anti-infectives: Anti-infectives   None      Assessment/Plan: Impression: Small bowel obstruction, resolved Plan: Patient would like to be discharged, which is fine with me. No need for followup in my office at this point unless symptoms return.  LOS: 4 days    Jaylan Hinojosa A 06/04/2013

## 2013-06-16 ENCOUNTER — Encounter: Payer: Self-pay | Admitting: Family Medicine

## 2013-06-16 ENCOUNTER — Ambulatory Visit (INDEPENDENT_AMBULATORY_CARE_PROVIDER_SITE_OTHER): Payer: Medicare Other | Admitting: Family Medicine

## 2013-06-16 ENCOUNTER — Other Ambulatory Visit (INDEPENDENT_AMBULATORY_CARE_PROVIDER_SITE_OTHER): Payer: Self-pay | Admitting: *Deleted

## 2013-06-16 ENCOUNTER — Telehealth (INDEPENDENT_AMBULATORY_CARE_PROVIDER_SITE_OTHER): Payer: Self-pay | Admitting: *Deleted

## 2013-06-16 ENCOUNTER — Encounter (INDEPENDENT_AMBULATORY_CARE_PROVIDER_SITE_OTHER): Payer: Self-pay

## 2013-06-16 VITALS — BP 130/90 | Ht 65.0 in | Wt 130.5 lb

## 2013-06-16 DIAGNOSIS — R1310 Dysphagia, unspecified: Secondary | ICD-10-CM

## 2013-06-16 DIAGNOSIS — K222 Esophageal obstruction: Secondary | ICD-10-CM | POA: Diagnosis not present

## 2013-06-16 DIAGNOSIS — E785 Hyperlipidemia, unspecified: Secondary | ICD-10-CM

## 2013-06-16 DIAGNOSIS — K565 Intestinal adhesions [bands], unspecified as to partial versus complete obstruction: Secondary | ICD-10-CM | POA: Diagnosis not present

## 2013-06-16 MED ORDER — PREDNISONE 20 MG PO TABS: ORAL_TABLET | ORAL | Status: DC

## 2013-06-16 MED ORDER — PANTOPRAZOLE SODIUM 40 MG PO TBEC: 40.0000 mg | DELAYED_RELEASE_TABLET | Freq: Two times a day (BID) | ORAL | Status: DC

## 2013-06-16 MED ORDER — OXYCODONE-ACETAMINOPHEN 10-325 MG PO TABS: 1.0000 | ORAL_TABLET | ORAL | Status: DC | PRN

## 2013-06-16 NOTE — Telephone Encounter (Signed)
  Procedure: egd/ed  Reason/Indication:  dysphagia  Has patient had this procedure before?  Yes, 2010 -- scanned  If so, when, by whom and where?    Is there a family history of colon cancer?    Who?  What age when diagnosed?    Is patient diabetic?   no      Does patient have prosthetic heart valve?  no  Do you have a pacemaker?  no  Has patient ever had endocarditis? no  Has patient had joint replacement within last 12 months?  no  Does patient tend to be constipated or take laxatives?   Is patient on Coumadin, Plavix and/or Aspirin? no  Medications: protonix daily, tylenol prn  Allergies: codeine  Medication Adjustment:   Procedure date & time: 06/20/13 at 905

## 2013-06-16 NOTE — Telephone Encounter (Signed)
agree

## 2013-06-16 NOTE — Progress Notes (Signed)
   Subjective:    Patient ID: Terri Harris, female    DOB: 1941-01-16, 73 y.o.   MRN: [id number]  HPI Patient is here today for a hospital follow up visit. She was admitted to [facility] on 05/31/12 for a small bowel obstruction. Patient states that she still has abdominal pain and belching. She doesn't feel her symptoms are improving at all.   patient with occasional dysphagia.   she's had several episodes where the abdomen is swollen she felt discomfort slight nausea some feeling like she is going to vomit PMH benign  Review of Systems see above     Objective:   Physical Exam  Constitutional: She is oriented to person, place, and time. She appears well-developed and well-nourished.  HENT:  Head: Normocephalic.  Right Ear: External ear normal.  Left Ear: External ear normal.  Neck: No thyromegaly present.  Cardiovascular: Normal rate, regular rhythm, normal heart sounds and intact distal pulses.   No murmur heard. Pulmonary/Chest: Effort normal and breath sounds normal. No respiratory distress. She has no wheezes.  Abdominal: Soft. Bowel sounds are normal. She exhibits no distension and no mass. There is tenderness.  Musculoskeletal: She exhibits no edema and no tenderness.  Lymphadenopathy:    She has no cervical adenopathy.  Neurological: She is alert and oriented to person, place, and time. She exhibits normal muscle tone.  Skin: Skin is warm and dry.  Psychiatric: She has a normal mood and affect. Her behavior is normal.   Mild epigastric tenderness.       Assessment & Plan:  1 partial small bowel obstruction I discussed this in detail with patient we will go ahead and set her up with general surgeon. Eventually patient is going to need to have surgery if it block becomes a total obstruction. We will try to get her in with [facility] surgery but if patient worsens she is to go to the emergency department with [facility] Will also be setting patient up with gastroenterology  she needs EGD and esophageal dilation

## 2013-06-17 ENCOUNTER — Encounter (HOSPITAL_COMMUNITY): Payer: Self-pay | Admitting: Pharmacy Technician

## 2013-06-18 ENCOUNTER — Ambulatory Visit (INDEPENDENT_AMBULATORY_CARE_PROVIDER_SITE_OTHER): Payer: Medicare Other | Admitting: General Surgery

## 2013-06-19 ENCOUNTER — Encounter (INDEPENDENT_AMBULATORY_CARE_PROVIDER_SITE_OTHER): Payer: Self-pay | Admitting: General Surgery

## 2013-06-19 ENCOUNTER — Inpatient Hospital Stay (HOSPITAL_COMMUNITY): Payer: Medicare Other

## 2013-06-19 ENCOUNTER — Ambulatory Visit (INDEPENDENT_AMBULATORY_CARE_PROVIDER_SITE_OTHER): Payer: Medicare Other | Admitting: General Surgery

## 2013-06-19 ENCOUNTER — Inpatient Hospital Stay (HOSPITAL_COMMUNITY)
Admission: AD | Admit: 2013-06-19 | Discharge: 2013-07-01 | DRG: 330 | Disposition: A | Discharge status: Home / Self Care | Payer: Medicare Other | Source: Ambulatory Visit | Attending: General Surgery | Admitting: General Surgery

## 2013-06-19 VITALS — BP 134/86 | HR 80 | Temp 97.3°F | Resp 18 | Ht 65.0 in | Wt 128.8 lb

## 2013-06-19 DIAGNOSIS — Y849 Medical procedure, unspecified as the cause of abnormal reaction of the patient, or of later complication, without mention of misadventure at the time of the procedure: Secondary | ICD-10-CM | POA: Diagnosis not present

## 2013-06-19 DIAGNOSIS — K219 Gastro-esophageal reflux disease without esophagitis: Secondary | ICD-10-CM | POA: Diagnosis present

## 2013-06-19 DIAGNOSIS — K4090 Unilateral inguinal hernia, without obstruction or gangrene, not specified as recurrent: Secondary | ICD-10-CM | POA: Diagnosis not present

## 2013-06-19 DIAGNOSIS — K560 Paralytic ileus: Secondary | ICD-10-CM | POA: Diagnosis present

## 2013-06-19 DIAGNOSIS — J449 Chronic obstructive pulmonary disease, unspecified: Secondary | ICD-10-CM | POA: Diagnosis present

## 2013-06-19 DIAGNOSIS — K567 Ileus, unspecified: Secondary | ICD-10-CM | POA: Diagnosis not present

## 2013-06-19 DIAGNOSIS — M1990 Unspecified osteoarthritis, unspecified site: Secondary | ICD-10-CM

## 2013-06-19 DIAGNOSIS — Z87891 Personal history of nicotine dependence: Secondary | ICD-10-CM

## 2013-06-19 DIAGNOSIS — K565 Intestinal adhesions [bands], unspecified as to partial versus complete obstruction: Secondary | ICD-10-CM | POA: Diagnosis not present

## 2013-06-19 DIAGNOSIS — K56609 Unspecified intestinal obstruction, unspecified as to partial versus complete obstruction: Secondary | ICD-10-CM

## 2013-06-19 DIAGNOSIS — K4030 Unilateral inguinal hernia, with obstruction, without gangrene, not specified as recurrent: Secondary | ICD-10-CM | POA: Diagnosis not present

## 2013-06-19 DIAGNOSIS — Z8542 Personal history of malignant neoplasm of other parts of uterus: Secondary | ICD-10-CM

## 2013-06-19 DIAGNOSIS — K633 Ulcer of intestine: Secondary | ICD-10-CM | POA: Diagnosis not present

## 2013-06-19 DIAGNOSIS — K660 Peritoneal adhesions (postprocedural) (postinfection): Secondary | ICD-10-CM | POA: Diagnosis not present

## 2013-06-19 DIAGNOSIS — M129 Arthropathy, unspecified: Secondary | ICD-10-CM | POA: Diagnosis present

## 2013-06-19 DIAGNOSIS — K929 Disease of digestive system, unspecified: Secondary | ICD-10-CM | POA: Diagnosis present

## 2013-06-19 DIAGNOSIS — K9189 Other postprocedural complications and disorders of digestive system: Secondary | ICD-10-CM

## 2013-06-19 DIAGNOSIS — J4489 Other specified chronic obstructive pulmonary disease: Secondary | ICD-10-CM | POA: Diagnosis present

## 2013-06-19 HISTORY — DX: Personal history of nicotine dependence: Z87.891

## 2013-06-19 HISTORY — DX: Unspecified intestinal obstruction, unspecified as to partial versus complete obstruction: K56.609

## 2013-06-19 HISTORY — DX: Ileus, unspecified: K56.7

## 2013-06-19 HISTORY — DX: Other postprocedural complications and disorders of digestive system: K91.89

## 2013-06-19 LAB — CBC
HCT: 40.7 % (ref 36.0–46.0)
Hemoglobin: 13.7 g/dL (ref 12.0–15.0)
MCH: 31.7 pg (ref 26.0–34.0)
MCHC: 33.7 g/dL (ref 30.0–36.0)
MCV: 94.2 fL (ref 78.0–100.0)
Platelets: 336 10*3/uL (ref 150–400)
RBC: 4.32 MIL/uL (ref 3.87–5.11)
RDW: 13.4 % (ref 11.5–15.5)
WBC: 7.4 10*3/uL (ref 4.0–10.5)

## 2013-06-19 LAB — COMPREHENSIVE METABOLIC PANEL
ALT: 13 U/L (ref 0–35)
AST: 22 U/L (ref 0–37)
Albumin: 3.9 g/dL (ref 3.5–5.2)
Alkaline Phosphatase: 81 U/L (ref 39–117)
BUN: 16 mg/dL (ref 6–23)
CO2: 26 mEq/L (ref 19–32)
Calcium: 9.2 mg/dL (ref 8.4–10.5)
Chloride: 97 mEq/L (ref 96–112)
Creatinine, Ser: 0.69 mg/dL (ref 0.50–1.10)
GFR calc Af Amer: >90 mL/min (ref >90)
GFR calc non Af Amer: 86 mL/min — ABNORMAL LOW (ref >90)
Glucose, Bld: 102 mg/dL — ABNORMAL HIGH (ref 70–99)
Potassium: 3.6 mEq/L — ABNORMAL LOW (ref 3.7–5.3)
Sodium: 140 mEq/L (ref 137–147)
Total Bilirubin: 0.4 mg/dL (ref 0.3–1.2)
Total Protein: 7.4 g/dL (ref 6.0–8.3)

## 2013-06-19 MED ORDER — SODIUM CHLORIDE 0.9 % IV SOLN
INTRAVENOUS | Status: DC
  Administered 2013-06-19: 19:00:00 via INTRAVENOUS

## 2013-06-19 MED ORDER — MORPHINE SULFATE 4 MG/ML IJ SOLN: 4.0000 mg | INTRAMUSCULAR | Status: DC | PRN

## 2013-06-19 MED ORDER — ONDANSETRON HCL 4 MG/2ML IJ SOLN: 4.0000 mg | Freq: Four times a day (QID) | INTRAMUSCULAR | Status: DC | PRN

## 2013-06-19 MED ORDER — ENOXAPARIN SODIUM 40 MG/0.4ML ~~LOC~~ SOLN
40.0000 mg | SUBCUTANEOUS | Status: DC
  Administered 2013-06-19 – 2013-06-20 (×2): 40 mg via SUBCUTANEOUS
  Filled 2013-06-19: qty 0.4

## 2013-06-19 NOTE — Progress Notes (Signed)
Chief Complaint  Patient presents with  . Abdominal Pain    eval abd pain, n/v, rule out SBO    HISTORY:  Terri Harris is a 73 y.o. female who presents to the office with Nausea and vomiting. She has had intermittent episodes of this since November. She has been hospitalized [facility] once for similar symptoms she was diagnosed with an fat-containing inguinal hernia and a small bowel obstruction. This resolved with NG decompression. Her only abdominal surgeries a hysterectomy for cervical cancer many years ago. Her last colonoscopy was in 2013 and normal except for some small polyps which were removed. She does have a esophageal stricture that is dilated occasionally. She is scheduled for this tomorrow. She is taking prednisone acutely for her back pain.  Past Medical History  Diagnosis Date  . Hypercholesteremia   . GERD (gastroesophageal reflux disease)   . Arthritis   . Osteoporosis   . ANA positive 01/2001  . Cancer     partial hysterectomy age 48-uterine cancer       Past Surgical History  Procedure Laterality Date  . Abdominal hysterectomy    . Colonoscopy  10/25/2011    Procedure: COLONOSCOPY;  Surgeon: Rogene Houston, MD;  Location: [facility];  Service: Endoscopy;  Laterality: N/A;  930  . Tonsillectomy    . Esophagogastroduodenoscopy  02/2009      Current Outpatient Prescriptions  Medication Sig Dispense Refill  . acetaminophen (TYLENOL) 500 MG tablet Take 1,000 mg by mouth every 6 (six) hours as needed. Pain      . predniSONE (DELTASONE) 20 MG tablet Take 20 mg by mouth daily with breakfast. Take 3 tablets first 3 days, 2 tablets for next 3 days, and 1 tablet for the next 3 days      . pantoprazole (PROTONIX) 40 MG tablet Take 1 tablet (40 mg total) by mouth 2 (two) times daily. For acid reflux  60 tablet  3   No current facility-administered medications for this visit.     Allergies  Allergen Reactions  . Codeine Nausea And Vomiting      Family History   Problem Relation Age of Onset  . Anesthesia problems Neg Hx   . Hypotension Neg Hx   . Malignant hyperthermia Neg Hx   . Pseudochol deficiency Neg Hx   . Hypertension Mother   . Heart disease Father   . Diabetes Sister   . Heart disease Sister   . Diabetes Brother   . Heart disease Brother       History   Social History  . Marital Status: Married    Spouse Name: N/A    Number of Children: N/A  . Years of Education: N/A   Social History Main Topics  . Smoking status: Former Smoker -- 0.25 packs/day for 20 years    Types: Cigarettes    Quit date: 10/18/2011  . Smokeless tobacco: None  . Alcohol Use: No  . Drug Use: No  . Sexual Activity: Yes    Birth Control/ Protection: Surgical   Other Topics Concern  . None   Social History Narrative  . None       REVIEW OF SYSTEMS - PERTINENT POSITIVES ONLY: Review of Systems - General ROS: negative for - chills or fever Hematological and Lymphatic ROS: negative for - bleeding problems or blood transfusions Respiratory ROS: no cough, shortness of breath, or wheezing Cardiovascular ROS: no chest pain or dyspnea on exertion Gastrointestinal ROS: no abdominal pain, change in  bowel habits, or black or bloody stools  EXAM: Filed Vitals:   06/19/13 1510  BP: 134/86  Pulse: 80  Temp: 97.3 F (36.3 C)  Resp: 18    General appearance: alert and cooperative Resp: clear to auscultation bilaterally Cardio: regular rate and rhythm GI: Abdomen distended, diffusely tender to palpation. Left inguinal hernia noted  ASSESSMENT AND PLAN:  Terri Harris is a 73 year old female with several months of nausea and vomiting and abdominal pain. She presents to the office today with inability to keep down liquids. We will get her admitted to the hospital directly. I have ordered some lab work and an abdominal x-ray. I anticipate she will need a repeat CT scan was her lab work returns. She was also likely need a NG tube for her presumed small  bowel obstruction. This has been discussed with Dr. Ninfa Linden on the [place] service.   Rosario Adie, MD Colon and Rectal Surgery / [facility]      Visit Diagnoses: No diagnosis found.  Primary Care Physician: Sallee Lange, MD

## 2013-06-20 ENCOUNTER — Encounter (HOSPITAL_COMMUNITY): Payer: Self-pay | Admitting: General Practice

## 2013-06-20 ENCOUNTER — Encounter (HOSPITAL_COMMUNITY): Admission: AD | Disposition: A | Discharge status: Home / Self Care | Payer: Self-pay | Source: Ambulatory Visit

## 2013-06-20 ENCOUNTER — Encounter (HOSPITAL_COMMUNITY): Payer: Medicare Other | Admitting: Certified Registered"

## 2013-06-20 ENCOUNTER — Ambulatory Visit (HOSPITAL_COMMUNITY): Admission: RE | Admit: 2013-06-20 | Payer: Medicare Other | Source: Ambulatory Visit | Admitting: Internal Medicine

## 2013-06-20 ENCOUNTER — Inpatient Hospital Stay (HOSPITAL_COMMUNITY): Payer: Medicare Other | Admitting: Certified Registered"

## 2013-06-20 ENCOUNTER — Encounter (HOSPITAL_COMMUNITY): Admission: RE | Payer: Self-pay | Source: Ambulatory Visit

## 2013-06-20 DIAGNOSIS — K4090 Unilateral inguinal hernia, without obstruction or gangrene, not specified as recurrent: Secondary | ICD-10-CM

## 2013-06-20 DIAGNOSIS — K565 Intestinal adhesions [bands], unspecified as to partial versus complete obstruction: Secondary | ICD-10-CM

## 2013-06-20 HISTORY — PX: LAPAROTOMY: SHX154

## 2013-06-20 HISTORY — PX: BOWEL RESECTION: SHX1257

## 2013-06-20 LAB — BASIC METABOLIC PANEL
BUN: 15 mg/dL (ref 6–23)
CO2: 26 mEq/L (ref 19–32)
Calcium: 8.6 mg/dL (ref 8.4–10.5)
Chloride: 101 mEq/L (ref 96–112)
Creatinine, Ser: 0.62 mg/dL (ref 0.50–1.10)
GFR calc Af Amer: >90 mL/min (ref >90)
GFR calc non Af Amer: 89 mL/min — ABNORMAL LOW (ref >90)
Glucose, Bld: 81 mg/dL (ref 70–99)
Potassium: 3.6 mEq/L — ABNORMAL LOW (ref 3.7–5.3)
Sodium: 139 mEq/L (ref 137–147)

## 2013-06-20 LAB — CBC
HCT: 38.2 % (ref 36.0–46.0)
Hemoglobin: 12.7 g/dL (ref 12.0–15.0)
MCH: 31.2 pg (ref 26.0–34.0)
MCHC: 33.2 g/dL (ref 30.0–36.0)
MCV: 93.9 fL (ref 78.0–100.0)
Platelets: 286 10*3/uL (ref 150–400)
RBC: 4.07 MIL/uL (ref 3.87–5.11)
RDW: 13.3 % (ref 11.5–15.5)
WBC: 6.9 10*3/uL (ref 4.0–10.5)

## 2013-06-20 LAB — MRSA PCR SCREENING: MRSA by PCR: NEGATIVE

## 2013-06-20 SURGERY — LAPAROTOMY, EXPLORATORY
Anesthesia: General | Site: Abdomen

## 2013-06-20 SURGERY — ESOPHAGOGASTRODUODENOSCOPY (EGD) WITH ESOPHAGEAL DILATION
Anesthesia: Moderate Sedation

## 2013-06-20 MED ORDER — POTASSIUM CHLORIDE IN NACL 20-0.9 MEQ/L-% IV SOLN
INTRAVENOUS | Status: DC
  Administered 2013-06-20 – 2013-06-23 (×7): via INTRAVENOUS
  Administered 2013-06-24: 100 mL/h via INTRAVENOUS
  Administered 2013-06-24: 05:00:00 via INTRAVENOUS
  Filled 2013-06-20 (×17): qty 1000

## 2013-06-20 MED ORDER — HYDROMORPHONE HCL PF 1 MG/ML IJ SOLN
0.2500 mg | INTRAMUSCULAR | Status: DC | PRN
  Administered 2013-06-20 (×4): 0.5 mg via INTRAVENOUS

## 2013-06-20 MED ORDER — FENTANYL CITRATE 0.05 MG/ML IJ SOLN
INTRAMUSCULAR | Status: AC
  Filled 2013-06-20: qty 5

## 2013-06-20 MED ORDER — ONDANSETRON HCL 4 MG/2ML IJ SOLN
4.0000 mg | Freq: Four times a day (QID) | INTRAMUSCULAR | Status: DC | PRN
  Administered 2013-06-20 – 2013-06-22 (×2): 4 mg via INTRAVENOUS

## 2013-06-20 MED ORDER — NALOXONE HCL 0.4 MG/ML IJ SOLN: 0.4000 mg | INTRAMUSCULAR | Status: DC | PRN

## 2013-06-20 MED ORDER — ONDANSETRON HCL 4 MG/2ML IJ SOLN
4.0000 mg | Freq: Four times a day (QID) | INTRAMUSCULAR | Status: DC | PRN
  Administered 2013-06-24: 4 mg via INTRAVENOUS
  Filled 2013-06-20 (×2): qty 2

## 2013-06-20 MED ORDER — MORPHINE SULFATE (PF) 1 MG/ML IV SOLN
INTRAVENOUS | Status: DC
  Administered 2013-06-20: 1.5 mg via INTRAVENOUS
  Administered 2013-06-20: 16:00:00 via INTRAVENOUS
  Administered 2013-06-21: 4.5 mg via INTRAVENOUS
  Administered 2013-06-21: 25 mg via INTRAVENOUS
  Administered 2013-06-21: 24.5 mg via INTRAVENOUS
  Administered 2013-06-21: 9 mg via INTRAVENOUS
  Administered 2013-06-21: 14:00:00 via INTRAVENOUS
  Administered 2013-06-21: 13.2 mg via INTRAVENOUS
  Administered 2013-06-22: 7.5 mg via INTRAVENOUS
  Administered 2013-06-22: 9.97 mg via INTRAVENOUS
  Administered 2013-06-22: 12.25 mg via INTRAVENOUS
  Filled 2013-06-20 (×5): qty 25

## 2013-06-20 MED ORDER — 0.9 % SODIUM CHLORIDE (POUR BTL) OPTIME
TOPICAL | Status: DC | PRN
  Administered 2013-06-20 (×2): 1000 mL

## 2013-06-20 MED ORDER — DIPHENHYDRAMINE HCL 25 MG PO CAPS
50.0000 mg | ORAL_CAPSULE | Freq: Once | ORAL | Status: AC
  Administered 2013-06-20: 50 mg via ORAL
  Filled 2013-06-20: qty 2

## 2013-06-20 MED ORDER — SUCCINYLCHOLINE CHLORIDE 20 MG/ML IJ SOLN
INTRAMUSCULAR | Status: DC | PRN
  Administered 2013-06-20: 120 mg via INTRAVENOUS

## 2013-06-20 MED ORDER — GLYCOPYRROLATE 0.2 MG/ML IJ SOLN
INTRAMUSCULAR | Status: AC
  Filled 2013-06-20: qty 2

## 2013-06-20 MED ORDER — HYDROMORPHONE HCL PF 1 MG/ML IJ SOLN
INTRAMUSCULAR | Status: AC
  Filled 2013-06-20: qty 1

## 2013-06-20 MED ORDER — FENTANYL CITRATE 0.05 MG/ML IJ SOLN
INTRAMUSCULAR | Status: DC | PRN
  Administered 2013-06-20: 50 ug via INTRAVENOUS
  Administered 2013-06-20: 150 ug via INTRAVENOUS
  Administered 2013-06-20 (×2): 50 ug via INTRAVENOUS

## 2013-06-20 MED ORDER — CEFAZOLIN SODIUM 1-5 GM-% IV SOLN
1.0000 g | Freq: Four times a day (QID) | INTRAVENOUS | Status: AC
  Administered 2013-06-20 – 2013-06-21 (×3): 1 g via INTRAVENOUS
  Filled 2013-06-20 (×3): qty 50

## 2013-06-20 MED ORDER — PROMETHAZINE HCL 25 MG/ML IJ SOLN: 6.2500 mg | Freq: Once | INTRAMUSCULAR | Status: DC

## 2013-06-20 MED ORDER — DIPHENHYDRAMINE HCL 50 MG/ML IJ SOLN: 12.5000 mg | Freq: Four times a day (QID) | INTRAMUSCULAR | Status: DC | PRN

## 2013-06-20 MED ORDER — MORPHINE SULFATE (PF) 1 MG/ML IV SOLN
INTRAVENOUS | Status: AC
  Filled 2013-06-20: qty 25

## 2013-06-20 MED ORDER — SODIUM CHLORIDE 0.9 % IJ SOLN: 9.0000 mL | INTRAMUSCULAR | Status: DC | PRN

## 2013-06-20 MED ORDER — ONDANSETRON HCL 4 MG/2ML IJ SOLN: 4.0000 mg | Freq: Once | INTRAMUSCULAR | Status: DC | PRN

## 2013-06-20 MED ORDER — LACTATED RINGERS IV SOLN
INTRAVENOUS | Status: DC
  Administered 2013-06-20: 13:00:00 via INTRAVENOUS

## 2013-06-20 MED ORDER — DEXTROSE 5 % IV SOLN: 2.0000 g | INTRAVENOUS | Status: DC

## 2013-06-20 MED ORDER — ONDANSETRON HCL 4 MG/2ML IJ SOLN
INTRAMUSCULAR | Status: DC | PRN
  Administered 2013-06-20: 4 mg via INTRAVENOUS

## 2013-06-20 MED ORDER — ENOXAPARIN SODIUM 40 MG/0.4ML ~~LOC~~ SOLN
40.0000 mg | SUBCUTANEOUS | Status: DC
  Administered 2013-06-21 – 2013-06-30 (×10): 40 mg via SUBCUTANEOUS
  Filled 2013-06-20 (×11): qty 0.4

## 2013-06-20 MED ORDER — PROPOFOL 10 MG/ML IV BOLUS
INTRAVENOUS | Status: DC | PRN
  Administered 2013-06-20: 100 mg via INTRAVENOUS

## 2013-06-20 MED ORDER — ONDANSETRON HCL 4 MG PO TABS: 4.0000 mg | ORAL_TABLET | Freq: Four times a day (QID) | ORAL | Status: DC | PRN

## 2013-06-20 MED ORDER — PROMETHAZINE HCL 25 MG/ML IJ SOLN
INTRAMUSCULAR | Status: AC
  Administered 2013-06-20: 6.25 mg
  Filled 2013-06-20: qty 1

## 2013-06-20 MED ORDER — LACTATED RINGERS IV SOLN
INTRAVENOUS | Status: DC | PRN
  Administered 2013-06-20 (×2): via INTRAVENOUS

## 2013-06-20 MED ORDER — DIPHENHYDRAMINE HCL 12.5 MG/5ML PO ELIX: 12.5000 mg | ORAL_SOLUTION | Freq: Four times a day (QID) | ORAL | Status: DC | PRN

## 2013-06-20 MED ORDER — OXYCODONE HCL 5 MG/5ML PO SOLN: 5.0000 mg | Freq: Once | ORAL | Status: DC | PRN

## 2013-06-20 MED ORDER — SODIUM CHLORIDE 0.9 % IV SOLN
10.0000 mg | INTRAVENOUS | Status: DC | PRN
  Administered 2013-06-20: 5 ug/min via INTRAVENOUS

## 2013-06-20 MED ORDER — SUCCINYLCHOLINE CHLORIDE 20 MG/ML IJ SOLN
INTRAMUSCULAR | Status: AC
  Filled 2013-06-20: qty 1

## 2013-06-20 MED ORDER — PNEUMOCOCCAL VAC POLYVALENT 25 MCG/0.5ML IJ INJ
0.5000 mL | INJECTION | INTRAMUSCULAR | Status: AC
  Administered 2013-06-21: 0.5 mL via INTRAMUSCULAR
  Filled 2013-06-20: qty 0.5

## 2013-06-20 MED ORDER — PROPOFOL 10 MG/ML IV BOLUS
INTRAVENOUS | Status: AC
  Filled 2013-06-20: qty 20

## 2013-06-20 MED ORDER — MEPERIDINE HCL 25 MG/ML IJ SOLN: 6.2500 mg | INTRAMUSCULAR | Status: DC | PRN

## 2013-06-20 MED ORDER — NEOSTIGMINE METHYLSULFATE 1 MG/ML IJ SOLN
INTRAMUSCULAR | Status: AC
  Filled 2013-06-20: qty 10

## 2013-06-20 MED ORDER — DEXTROSE 5 % IV SOLN
1.0000 g | INTRAVENOUS | Status: DC | PRN
  Administered 2013-06-20: 1 g via INTRAVENOUS

## 2013-06-20 MED ORDER — ONDANSETRON HCL 4 MG/2ML IJ SOLN
INTRAMUSCULAR | Status: AC
  Administered 2013-06-20: 4 mg
  Filled 2013-06-20: qty 2

## 2013-06-20 MED ORDER — MIDAZOLAM HCL 5 MG/5ML IJ SOLN
INTRAMUSCULAR | Status: DC | PRN
  Administered 2013-06-20: 2 mg via INTRAVENOUS

## 2013-06-20 MED ORDER — OXYCODONE HCL 5 MG PO TABS: 5.0000 mg | ORAL_TABLET | Freq: Once | ORAL | Status: DC | PRN

## 2013-06-20 MED ORDER — LIDOCAINE HCL (CARDIAC) 20 MG/ML IV SOLN
INTRAVENOUS | Status: DC | PRN
  Administered 2013-06-20: 100 mg via INTRAVENOUS

## 2013-06-20 MED ORDER — ALBUMIN HUMAN 5 % IV SOLN
INTRAVENOUS | Status: DC | PRN
  Administered 2013-06-20: 14:00:00 via INTRAVENOUS

## 2013-06-20 MED ORDER — GLYCOPYRROLATE 0.2 MG/ML IJ SOLN
INTRAMUSCULAR | Status: DC | PRN
  Administered 2013-06-20: 0.6 mg via INTRAVENOUS

## 2013-06-20 MED ORDER — MIDAZOLAM HCL 2 MG/2ML IJ SOLN
INTRAMUSCULAR | Status: AC
  Filled 2013-06-20: qty 2

## 2013-06-20 MED ORDER — HYDROMORPHONE HCL PF 1 MG/ML IJ SOLN
1.0000 mg | INTRAMUSCULAR | Status: DC | PRN
  Administered 2013-06-21: 1 mg via INTRAVENOUS
  Filled 2013-06-20: qty 1

## 2013-06-20 MED ORDER — NEOSTIGMINE METHYLSULFATE 1 MG/ML IJ SOLN
INTRAMUSCULAR | Status: DC | PRN
  Administered 2013-06-20: 4 mg via INTRAVENOUS

## 2013-06-20 MED ORDER — DEXTROSE 5 % IV SOLN
2.0000 g | INTRAVENOUS | Status: DC
  Administered 2013-06-20: 2 g via INTRAVENOUS
  Filled 2013-06-20: qty 2

## 2013-06-20 MED ORDER — ONDANSETRON HCL 4 MG/2ML IJ SOLN
INTRAMUSCULAR | Status: AC
  Filled 2013-06-20: qty 2

## 2013-06-20 MED ORDER — ROCURONIUM BROMIDE 100 MG/10ML IV SOLN
INTRAVENOUS | Status: DC | PRN
  Administered 2013-06-20: 20 mg via INTRAVENOUS
  Administered 2013-06-20: 10 mg via INTRAVENOUS

## 2013-06-20 SURGICAL SUPPLY — 54 items
BLADE SURG ROTATE 9660 (MISCELLANEOUS) IMPLANT
CANISTER SUCTION 2500CC (MISCELLANEOUS) ×3 IMPLANT
COVER MAYO STAND STRL (DRAPES) IMPLANT
COVER SURGICAL LIGHT HANDLE (MISCELLANEOUS) ×3 IMPLANT
DRAPE LAPAROSCOPIC ABDOMINAL (DRAPES) ×3 IMPLANT
DRAPE PROXIMA HALF (DRAPES) IMPLANT
DRAPE UTILITY 15X26 W/TAPE STR (DRAPE) ×6 IMPLANT
DRAPE WARM FLUID 44X44 (DRAPE) ×3 IMPLANT
DRSG OPSITE POSTOP 4X10 (GAUZE/BANDAGES/DRESSINGS) IMPLANT
DRSG OPSITE POSTOP 4X8 (GAUZE/BANDAGES/DRESSINGS) IMPLANT
ELECT BLADE 6.5 EXT (BLADE) IMPLANT
ELECT CAUTERY BLADE 6.4 (BLADE) ×3 IMPLANT
ELECT REM PT RETURN 9FT ADLT (ELECTROSURGICAL) ×3
ELECTRODE REM PT RTRN 9FT ADLT (ELECTROSURGICAL) ×1 IMPLANT
GLOVE BIO SURGEON STRL SZ7.5 (GLOVE) ×2 IMPLANT
GLOVE BIOGEL PI IND STRL 7.0 (GLOVE) IMPLANT
GLOVE BIOGEL PI IND STRL 7.5 (GLOVE) IMPLANT
GLOVE BIOGEL PI INDICATOR 7.0 (GLOVE) ×4
GLOVE BIOGEL PI INDICATOR 7.5 (GLOVE) ×2
GLOVE SURG SIGNA 7.5 PF LTX (GLOVE) ×3 IMPLANT
GOWN STRL NON-REIN LRG LVL3 (GOWN DISPOSABLE) ×4 IMPLANT
GOWN STRL REIN XL XLG (GOWN DISPOSABLE) ×3 IMPLANT
KIT BASIN OR (CUSTOM PROCEDURE TRAY) ×3 IMPLANT
KIT ROOM TURNOVER OR (KITS) ×3 IMPLANT
LIGASURE IMPACT 36 18CM CVD LR (INSTRUMENTS) ×2 IMPLANT
NS IRRIG 1000ML POUR BTL (IV SOLUTION) ×6 IMPLANT
PACK GENERAL/GYN (CUSTOM PROCEDURE TRAY) ×3 IMPLANT
PAD ARMBOARD 7.5X6 YLW CONV (MISCELLANEOUS) ×3 IMPLANT
PENCIL BUTTON HOLSTER BLD 10FT (ELECTRODE) IMPLANT
RELOAD PROXIMATE 75MM BLUE (ENDOMECHANICALS) ×6 IMPLANT
RELOAD STAPLE 75 3.8 BLU REG (ENDOMECHANICALS) IMPLANT
SPECIMEN JAR LARGE (MISCELLANEOUS) ×2 IMPLANT
SPONGE GAUZE 4X4 12PLY (GAUZE/BANDAGES/DRESSINGS) ×2 IMPLANT
SPONGE GAUZE 4X4 12PLY STER LF (GAUZE/BANDAGES/DRESSINGS) ×2 IMPLANT
SPONGE LAP 18X18 X RAY DECT (DISPOSABLE) IMPLANT
STAPLER GUN LINEAR PROX 60 (STAPLE) ×2 IMPLANT
STAPLER PROXIMATE 75MM BLUE (STAPLE) ×2 IMPLANT
STAPLER VISISTAT 35W (STAPLE) ×5 IMPLANT
SUCTION POOLE TIP (SUCTIONS) ×3 IMPLANT
SUT PDS AB 1 TP1 96 (SUTURE) ×6 IMPLANT
SUT SILK 2 0 SH CR/8 (SUTURE) ×3 IMPLANT
SUT SILK 2 0 TIES 10X30 (SUTURE) ×3 IMPLANT
SUT SILK 3 0 SH CR/8 (SUTURE) ×3 IMPLANT
SUT SILK 3 0 TIES 10X30 (SUTURE) ×3 IMPLANT
SUT VIC AB 3-0 SH 18 (SUTURE) IMPLANT
TAPE CLOTH SURG 4X10 WHT LF (GAUZE/BANDAGES/DRESSINGS) ×2 IMPLANT
TAPE CLOTH SURG 6X10 WHT LF (GAUZE/BANDAGES/DRESSINGS) ×2 IMPLANT
TOWEL OR 17X24 6PK STRL BLUE (TOWEL DISPOSABLE) ×3 IMPLANT
TOWEL OR 17X26 10 PK STRL BLUE (TOWEL DISPOSABLE) ×3 IMPLANT
TOWEL OR NON WOVEN STRL DISP B (DISPOSABLE) ×2 IMPLANT
TRAY FOLEY CATH 16FRSI W/METER (SET/KITS/TRAYS/PACK) ×2 IMPLANT
TUBE CONNECTING 12'X1/4 (SUCTIONS)
TUBE CONNECTING 12X1/4 (SUCTIONS) IMPLANT
YANKAUER SUCT BULB TIP NO VENT (SUCTIONS) ×2 IMPLANT

## 2013-06-20 NOTE — Anesthesia Procedure Notes (Signed)
Procedure Name: Intubation Date/Time: 06/20/2013 1:53 PM Performed by: Jenne Campus Pre-anesthesia Checklist: Patient identified, Emergency Drugs available, Suction available, Patient being monitored and Timeout performed Patient Re-evaluated:Patient Re-evaluated prior to inductionOxygen Delivery Method: Circle system utilized Preoxygenation: Pre-oxygenation with 100% oxygen Intubation Type: IV induction, Rapid sequence and Cricoid Pressure applied Laryngoscope Size: Miller and 2 Grade View: Grade I Tube type: Oral Tube size: 7.0 mm Number of attempts: 1 Airway Equipment and Method: Stylet Placement Confirmation: ETT inserted through vocal cords under direct vision,  positive ETCO2,  CO2 detector and breath sounds checked- equal and bilateral Secured at: 21 cm Tube secured with: Tape Dental Injury: Teeth and Oropharynx as per pre-operative assessment

## 2013-06-20 NOTE — Progress Notes (Signed)
Utilization review completed. Jaelyn Cloninger, RN, BSN. 

## 2013-06-20 NOTE — Anesthesia Preprocedure Evaluation (Signed)
Anesthesia Evaluation  Patient identified by MRN, date of birth, ID band Patient awake    Reviewed: Allergy & Precautions, H&P , Patient's Chart, lab work & pertinent test results  Airway Mallampati: I TM Distance: >3 FB Neck ROM: Full    Dental   Pulmonary former smoker,          Cardiovascular     Neuro/Psych    GI/Hepatic GERD-  Medicated and Controlled,  Endo/Other    Renal/GU      Musculoskeletal   Abdominal   Peds  Hematology   Anesthesia Other Findings   Reproductive/Obstetrics                           Anesthesia Physical Anesthesia Plan  ASA: II  Anesthesia Plan: General   Post-op Pain Management:    Induction: Intravenous  Airway Management Planned: Oral ETT  Additional Equipment:   Intra-op Plan:   Post-operative Plan: Extubation in OR  Informed Consent: I have reviewed the patients History and Physical, chart, labs and discussed the procedure including the risks, benefits and alternatives for the proposed anesthesia with the patient or authorized representative who has indicated his/her understanding and acceptance.     Plan Discussed with: CRNA and Surgeon  Anesthesia Plan Comments:         Anesthesia Quick Evaluation

## 2013-06-20 NOTE — Transfer of Care (Signed)
Immediate Anesthesia Transfer of Care Note  Patient: Terri Harris  Procedure(s) Performed: Procedure(s): EXPLORATORY LAPAROTOMY (N/A)  Patient Location: PACU  Anesthesia Type:General  Level of Consciousness: awake, alert , oriented and patient cooperative  Airway & Oxygen Therapy: Patient Spontanous Breathing and Patient connected to nasal cannula oxygen  Post-op Assessment: Report given to PACU RN and Post -op Vital signs reviewed and stable  Post vital signs: Reviewed  Complications: No apparent anesthesia complications

## 2013-06-20 NOTE — Preoperative (Signed)
Beta Blockers   Reason not to administer Beta Blockers:Not Applicable 

## 2013-06-20 NOTE — Op Note (Signed)
EXPLORATORY LAPAROTOMY  Procedure Note  ANALEAH BRAME 06/19/2013 - 06/20/2013   Pre-op Diagnosis: small bowel obstruction, left inguinal hernia     Post-op Diagnosis: small bowel obstruction, left inguinal hernia  Procedure(s): EXPLORATORY LAPAROTOMY LYSIS OF ADHESION SMALL BOWEL RESECTION REPAIR LEFT INGUINAL HERNIA  Surgeon(s): Harl Bowie, MD  Anesthesia: General  Staff:  Circulator: Tomma Rakers Sipsis, RN; Levora Angel, RN Scrub Person: Epifanio Lesches Pingue, CST  Estimated Blood Loss: Minimal               Specimens: SENT TO [facility]          Coralie Keens A   Date: 06/20/2013  Time: 2:43 PM

## 2013-06-20 NOTE — Progress Notes (Signed)
Patient ID: Terri Harris, female   DOB: 06/09/1940, 73 y.o.   MRN: [id number]  I have examined [name] and reviewed her films.  I think she has a significant high grade partial SBO.  I don't think she will get better without surgery.  I am recommending an exploratory laparotomy.  I discussed the risks of surgery with her.  These risks include but are not limited to bleeding, infection, need for bowel resection, injury to surrounding structures, cardiopulmonary problems, DVT, etc.  She understands and agrees to proceed.

## 2013-06-20 NOTE — Anesthesia Postprocedure Evaluation (Signed)
Anesthesia Post Note  Patient: Terri Harris  Procedure(s) Performed: Procedure(s) (LRB): EXPLORATORY LAPAROTOMY (N/A)  Anesthesia type: general  Patient location: PACU  Post pain: Pain level controlled  Post assessment: Patient's Cardiovascular Status Stable  Last Vitals:  Filed Vitals:   06/20/13 1645  BP:   Pulse: 55  Temp: 36.6 C  Resp: 15    Post vital signs: Reviewed and stable  Level of consciousness: sedated  Complications: No apparent anesthesia complications

## 2013-06-21 LAB — CBC
HCT: 37 % (ref 36.0–46.0)
Hemoglobin: 12.3 g/dL (ref 12.0–15.0)
MCH: 31.6 pg (ref 26.0–34.0)
MCHC: 33.2 g/dL (ref 30.0–36.0)
MCV: 95.1 fL (ref 78.0–100.0)
Platelets: 351 10*3/uL (ref 150–400)
RBC: 3.89 MIL/uL (ref 3.87–5.11)
RDW: 13.5 % (ref 11.5–15.5)
WBC: 16.2 10*3/uL — ABNORMAL HIGH (ref 4.0–10.5)

## 2013-06-21 LAB — BASIC METABOLIC PANEL
BUN: 19 mg/dL (ref 6–23)
CO2: 20 mEq/L (ref 19–32)
Calcium: 7.9 mg/dL — ABNORMAL LOW (ref 8.4–10.5)
Chloride: 105 mEq/L (ref 96–112)
Creatinine, Ser: 1.35 mg/dL — ABNORMAL HIGH (ref 0.50–1.10)
GFR calc Af Amer: 45 mL/min — ABNORMAL LOW (ref >90)
GFR calc non Af Amer: 38 mL/min — ABNORMAL LOW (ref >90)
Glucose, Bld: 107 mg/dL — ABNORMAL HIGH (ref 70–99)
Potassium: 5 mEq/L (ref 3.7–5.3)
Sodium: 141 mEq/L (ref 137–147)

## 2013-06-21 MED ORDER — SODIUM CHLORIDE 0.9 % IV BOLUS (SEPSIS)
1000.0000 mL | Freq: Once | INTRAVENOUS | Status: AC
  Administered 2013-06-21: 1000 mL via INTRAVENOUS

## 2013-06-21 NOTE — Progress Notes (Signed)
Pt quite painful, using PCA very frequent.  Good respirations and O2 at this time

## 2013-06-21 NOTE — Progress Notes (Signed)
1 Day Post-Op  Subjective: comfortable  Objective: Vital signs in last 24 hours: Temp:  [97.4 F (36.3 C)-98.2 F (36.8 C)] 97.4 F (36.3 C) (01/24 1012) Pulse Rate:  [55-104] 104 (01/24 1012) Resp:  [8-24] 18 (01/24 1012) BP: (89-193)/(51-89) 117/56 mmHg (01/24 1012) SpO2:  [90 %-99 %] 95 % (01/24 1012) Last BM Date:  (pre op)  Intake/Output from previous day: 01/23 0701 - 01/24 0700 In: 2774.3 [I.V.:2324.3; IV Piggyback:450] Out: 500 [Urine:450; Blood:50] Intake/Output this shift:    Lungs clear Abdomen soft, dressings dry  Lab Results:   Recent Labs  06/20/13 0344 06/21/13 0615  WBC 6.9 16.2*  HGB 12.7 12.3  HCT 38.2 37.0  PLT 286 351   BMET  Recent Labs  06/20/13 0344 06/21/13 0615  NA 139 141  K 3.6* 5.0  CL 101 105  CO2 26 20  GLUCOSE 81 107*  BUN 15 19  CREATININE 0.62 1.35*  CALCIUM 8.6 7.9*   PT/INR No results found for this basename: LABPROT, INR,  in the last 72 hours ABG No results found for this basename: PHART, PCO2, PO2, HCO3,  in the last 72 hours  Studies/Results: Dg Abd 1 View  06/19/2013   CLINICAL DATA:  Nausea and vomiting.  EXAM: ABDOMEN - 1 VIEW  COMPARISON:  Radiograph dated 05/31/2013 and CT scan of the abdomen 05/31/2013  FINDINGS: There is persistent marked dilatation of multiple small bowel loops. The colon is not distended.  No acute osseous abnormality. Joint space narrowing in the left hip. No change in the a slight lumbar scoliosis.  IMPRESSION: Persistent prominent small bowel obstruction, unchanged since 05/31/2013.   Electronically Signed   By: Rozetta Nunnery M.D.   On: 06/19/2013 22:19    Anti-infectives: Anti-infectives   Start     Dose/Rate Route Frequency Ordered Stop   06/21/13 0600  cefOXitin (MEFOXIN) 2 g in dextrose 5 % 50 mL IVPB  Status:  Discontinued     2 g 100 mL/hr over 30 Minutes Intravenous On call to O.R. 06/20/13 1735 06/20/13 1736   06/20/13 2000  ceFAZolin (ANCEF) IVPB 1 g/50 mL premix     1  g 100 mL/hr over 30 Minutes Intravenous Every 6 hours 06/20/13 1735 06/21/13 0827   06/20/13 1415  cefOXitin (MEFOXIN) 2 g in dextrose 5 % 50 mL IVPB  Status:  Discontinued     2 g 100 mL/hr over 30 Minutes Intravenous To Surgery 06/20/13 1400 06/20/13 1700      Assessment/Plan: s/p Procedure(s): EXPLORATORY LAPAROTOMY (N/A)  D/c NG Bolus IVF.  I think she is dry D/c foley  LOS: 2 days    Terri Harris A 06/21/2013

## 2013-06-21 NOTE — Op Note (Signed)
Terri Harris, Terri Harris                 ACCOUNT NO.:  [id number]  MEDICAL RECORD NO.:  [id number]  LOCATION:  [id number]                        FACILITY:  [facility]  PHYSICIAN:  Coralie Keens, M.D. DATE OF BIRTH:  [birth year]  DATE OF PROCEDURE:  06/20/2013 DATE OF DISCHARGE:                              OPERATIVE REPORT   PREOPERATIVE DIAGNOSES: 1. Small bowel obstruction. 2. Left inguinal hernia.  POSTOPERATIVE DIAGNOSES: 1. Small bowel obstruction. 2. Left inguinal hernia.  PROCEDURE: 1. Exploratory laparotomy. 2. Lysis of adhesions. 3. Small bowel resection. 4. Left inguinal hernia repair.  SURGEON:  Coralie Keens, M.D.  ANESTHESIA:  General.  ESTIMATED BLOOD LOSS:  Minimal.  INDICATION:  This is a 73 year old female presents with intermittent partial small bowel obstruction for the last several months.  Because of the CAT scan consistent with adhesions and a hernia containing incarcerated omentum, decision was made to proceed with exploratory laparotomy.  FINDINGS:  The patient was found to have a single adhesive band creating a near-total small bowel obstruction.  The small bowel obstruction from adhesions had to resect small bowel.  There was an incidental left inguinal hernia containing only omentum.  I was able to reduce the omentum and repair the left inguinal hernia.  PROCEDURE IN DETAIL:  The patient was brought to the operating room, identified as Terri Harris.  She was placed supine on the operating room table and general anesthesia was induced.  Her abdomen was then prepped and draped in the usual sterile fashion.  Using a #10 blade, I created a midline incision with a scalpel.  I took this down through the fascia with electrocautery.  The peritoneum was then opened the entire length of the incision.  Upon entering the abdomen, patient was found to have free fluid.  I eviscerated the small bowel.  In doing so, freed up the single adhesion that was creating the  bowel obstruction.  This was in the mid small bowel.  It was a dense adhesive band.  The small bowel with stricture at the area of the adhesion.  At this point, I was able to reduce the omentum stuck in the left inguinal hernia.  I had to excise the small amount of omentum in order to do so.  I then ran the small bowel from ligament of Treitz to the terminal ileum.  I milked a lot of the small bowel contents through the area of stricturing but it never went dilated up well.  At this point, decision made to proceed with a bowel resection.  I took down the mesentery on each side of the area of the stricture with the ligature.  I then transected the small bowel proximal and distal to the stricture with the GIA 75 stapler.  I then removed the small bowel, sent to pathology for evaluation.  I reapproximated the remaining small bowel in a side-to-side fashion with interrupted silk sutures.  I then created 2 enterotomies and performed a side-to-side stapled anastomosis with a single firing of the GIA 75 stapler.  The open end was then closed with a TA 60 stapler.  I closed the mesenteric defect with silk sutures and reinforced the staple line with  2-0 and 3-0 silk sutures.  I again milk small bowel contents through the anastomosis and no evidence of leak was identified.  At this point, I elected to close the small hernia defect in the left inguinal area with a figure-of-eight #1 Prolene suture.  I then irrigated the abdomen with saline.  I closed the midline fascia with a running #1 looped PDS suture.  The skin was then irrigated and closed with skin staples.  The patient tolerated the procedure well.  All the counts were correct at the end of the procedure.  The patient was then extubated in the operating room and taken in stable condition to recovery room.     Coralie Keens, M.D.     DB/MEDQ  D:  06/20/2013  T:  06/21/2013  Job:  [id number]

## 2013-06-22 LAB — CBC
HCT: 28.1 % — ABNORMAL LOW (ref 36.0–46.0)
Hemoglobin: 9.6 g/dL — ABNORMAL LOW (ref 12.0–15.0)
MCH: 32.4 pg (ref 26.0–34.0)
MCHC: 34.2 g/dL (ref 30.0–36.0)
MCV: 94.9 fL (ref 78.0–100.0)
Platelets: 229 10*3/uL (ref 150–400)
RBC: 2.96 MIL/uL — ABNORMAL LOW (ref 3.87–5.11)
RDW: 13.8 % (ref 11.5–15.5)
WBC: 6.2 10*3/uL (ref 4.0–10.5)

## 2013-06-22 LAB — BASIC METABOLIC PANEL
BUN: 17 mg/dL (ref 6–23)
CO2: 25 mEq/L (ref 19–32)
Calcium: 8.1 mg/dL — ABNORMAL LOW (ref 8.4–10.5)
Chloride: 107 mEq/L (ref 96–112)
Creatinine, Ser: 0.61 mg/dL (ref 0.50–1.10)
GFR calc Af Amer: >90 mL/min (ref >90)
GFR calc non Af Amer: 89 mL/min — ABNORMAL LOW (ref >90)
Glucose, Bld: 96 mg/dL (ref 70–99)
Potassium: 4.9 mEq/L (ref 3.7–5.3)
Sodium: 141 mEq/L (ref 137–147)

## 2013-06-22 MED ORDER — PROMETHAZINE HCL 25 MG/ML IJ SOLN: 12.5000 mg | Freq: Four times a day (QID) | INTRAMUSCULAR | Status: DC | PRN

## 2013-06-22 MED ORDER — BISACODYL 10 MG RE SUPP
10.0000 mg | Freq: Every day | RECTAL | Status: DC | PRN
  Administered 2013-06-22 – 2013-06-29 (×2): 10 mg via RECTAL
  Filled 2013-06-22 (×2): qty 1

## 2013-06-22 MED ORDER — HYDROMORPHONE HCL PF 1 MG/ML IJ SOLN
0.5000 mg | INTRAMUSCULAR | Status: DC | PRN
  Administered 2013-06-22 – 2013-06-24 (×15): 1 mg via INTRAVENOUS
  Administered 2013-06-24 (×2): 0.5 mg via INTRAVENOUS
  Administered 2013-06-24 – 2013-06-26 (×10): 1 mg via INTRAVENOUS
  Filled 2013-06-22 (×29): qty 1

## 2013-06-22 NOTE — Progress Notes (Signed)
I have seen and examined the pt and agree with PA-Dort's progress note. Replace NGT AMbulate AM KUB

## 2013-06-22 NOTE — Progress Notes (Signed)
2 Days Post-Op  Subjective: Pt is miserable since last night after NG was removed.  +nausea, dry heaving and abdominal distension.  Wants NG back in.  Ambulating well and using IS.  Objective: Vital signs in last 24 hours: Temp:  [97.4 F (36.3 C)-99.4 F (37.4 C)] 99.4 F (37.4 C) (01/25 1008) Pulse Rate:  [92-100] 97 (01/25 1008) Resp:  [18-20] 20 (01/25 1222) BP: (129-149)/(62-88) 149/65 mmHg (01/25 1008) SpO2:  [95 %-100 %] 95 % (01/25 1222) Last BM Date:  (pre op)  Intake/Output from previous day: 01/24 0701 - 01/25 0700 In: 1566.7 [I.V.:1566.7] Out: 1450 [Urine:1450] Intake/Output this shift:    PE: Gen:  Alert, NAD, pleasant, noticeably nauseated and uncomfortable Abd: Soft, moderate tenderness, moderate distension, diminished BS, no HSM, incisions C/D/I   Lab Results:   Recent Labs  06/21/13 0615 06/22/13 0730  WBC 16.2* 6.2  HGB 12.3 9.6*  HCT 37.0 28.1*  PLT 351 229   BMET  Recent Labs  06/21/13 0615 06/22/13 0730  NA 141 141  K 5.0 4.9  CL 105 107  CO2 20 25  GLUCOSE 107* 96  BUN 19 17  CREATININE 1.35* 0.61  CALCIUM 7.9* 8.1*   PT/INR No results found for this basename: LABPROT, INR,  in the last 72 hours CMP     Component Value Date/Time   NA 141 06/22/2013 0730   K 4.9 06/22/2013 0730   CL 107 06/22/2013 0730   CO2 25 06/22/2013 0730   GLUCOSE 96 06/22/2013 0730   BUN 17 06/22/2013 0730   CREATININE 0.61 06/22/2013 0730   CALCIUM 8.1* 06/22/2013 0730   PROT 7.4 06/19/2013 1745   ALBUMIN 3.9 06/19/2013 1745   AST 22 06/19/2013 1745   ALT 13 06/19/2013 1745   ALKPHOS 81 06/19/2013 1745   BILITOT 0.4 06/19/2013 1745   GFRNONAA 89* 06/22/2013 0730   GFRAA >90 06/22/2013 0730   Lipase     Component Value Date/Time   LIPASE 44 12/10/2010 1500       Studies/Results: No results found.  Anti-infectives: Anti-infectives   Start     Dose/Rate Route Frequency Ordered Stop   06/21/13 0600  cefOXitin (MEFOXIN) 2 g in dextrose 5 % 50 mL IVPB   Status:  Discontinued     2 g 100 mL/hr over 30 Minutes Intravenous On call to O.R. 06/20/13 1735 06/20/13 1736   06/20/13 2000  ceFAZolin (ANCEF) IVPB 1 g/50 mL premix     1 g 100 mL/hr over 30 Minutes Intravenous Every 6 hours 06/20/13 1735 06/21/13 0827   06/20/13 1415  cefOXitin (MEFOXIN) 2 g in dextrose 5 % 50 mL IVPB  Status:  Discontinued     2 g 100 mL/hr over 30 Minutes Intravenous To Surgery 06/20/13 1400 06/20/13 1700       Assessment/Plan POD #2 s/p Ex Lap, LOA, SBR, Left inguinal hernia repair Post-op ileus  Plan: 1.  IVF, pain control (d/c PCA and start prn's), antiemetics 2.  NPO until bowel function returns, re-insert NG tube 3.  Ambulate and IS 4.  SCD's and lovenox 5.  Await bowel function  Talked with daughter at bedside    LOS: 3 days    Coralie Keens 06/22/2013, 12:55 PM Pager: [phone]

## 2013-06-23 ENCOUNTER — Inpatient Hospital Stay (HOSPITAL_COMMUNITY): Payer: Medicare Other

## 2013-06-23 LAB — BASIC METABOLIC PANEL
BUN: 9 mg/dL (ref 6–23)
CO2: 24 mEq/L (ref 19–32)
Calcium: 8.2 mg/dL — ABNORMAL LOW (ref 8.4–10.5)
Chloride: 101 mEq/L (ref 96–112)
Creatinine, Ser: 0.43 mg/dL — ABNORMAL LOW (ref 0.50–1.10)
GFR calc Af Amer: >90 mL/min (ref >90)
GFR calc non Af Amer: >90 mL/min (ref >90)
Glucose, Bld: 78 mg/dL (ref 70–99)
Potassium: 4.1 mEq/L (ref 3.7–5.3)
Sodium: 142 mEq/L (ref 137–147)

## 2013-06-23 LAB — CBC
HCT: 26.9 % — ABNORMAL LOW (ref 36.0–46.0)
Hemoglobin: 8.8 g/dL — ABNORMAL LOW (ref 12.0–15.0)
MCH: 31 pg (ref 26.0–34.0)
MCHC: 32.7 g/dL (ref 30.0–36.0)
MCV: 94.7 fL (ref 78.0–100.0)
Platelets: 236 10*3/uL (ref 150–400)
RBC: 2.84 MIL/uL — ABNORMAL LOW (ref 3.87–5.11)
RDW: 13.5 % (ref 11.5–15.5)
WBC: 6.7 10*3/uL (ref 4.0–10.5)

## 2013-06-23 MED ORDER — ACETAMINOPHEN 650 MG RE SUPP
650.0000 mg | Freq: Four times a day (QID) | RECTAL | Status: DC | PRN
  Administered 2013-06-23: 650 mg via RECTAL
  Filled 2013-06-23 (×2): qty 1

## 2013-06-23 MED ORDER — PANTOPRAZOLE SODIUM 40 MG IV SOLR
40.0000 mg | Freq: Two times a day (BID) | INTRAVENOUS | Status: DC
  Administered 2013-06-23 – 2013-06-25 (×6): 40 mg via INTRAVENOUS
  Filled 2013-06-23 (×9): qty 40

## 2013-06-23 NOTE — Progress Notes (Signed)
Postop course not surprising.  Kathryne Eriksson. Dahlia Bailiff, MD, [place] [phone] [facility]

## 2013-06-23 NOTE — Progress Notes (Signed)
Patient ID: Terri Harris, female   DOB: 30-Oct-1940, 73 y.o.   MRN: [id number]   Subjective: Pt tearful, does not feel like she is going to get better.  No n/v.  No flatus.    Objective:  Vital signs:  Filed Vitals:   06/22/13 1510 06/22/13 2233 06/23/13 0217 06/23/13 0558  BP: 138/55 156/73 148/78 134/78  Pulse: 92 94 98 89  Temp: 98.2 F (36.8 C) 98 F (36.7 C) 98.1 F (36.7 C) 98.2 F (36.8 C)  TempSrc: Oral Oral Oral Oral  Resp: 16 18 16 16   Height:      Weight:      SpO2: 98% 97% 98% 96%    Last BM Date:  (pre op)  Intake/Output   Yesterday:  01/25 0701 - 01/26 0700 In: 2808.3 [I.V.:2808.3] Out: 1600 [Urine:200; Emesis/NG output:1400]  Physical Exam:  General: Pt awake/alert/oriented x3 in no acute distress Chest: CTA.  No chest wall pain w good excursion CV:  Pulses intact.  Regular rhythm MS: Normal AROM mjr joints.  No obvious deformity Abdomen: Soft. Hypoactive bowel sounds.  Midline incision is c/d/i.  No evidence of peritonitis.  No incarcerated hernias. Ext:  SCDs BLE.  No mjr edema.  No cyanosis Skin: No petechiae / purpura   Problem List:   Active Problems:   SBO (small bowel obstruction)    Results:   Labs: Results for orders placed during the hospital encounter of 06/19/13 (from the past 48 hour(s))  CBC     Status: Abnormal   Collection Time    06/22/13  7:30 AM      Result Value Range   WBC 6.2  4.0 - 10.5 K/uL   RBC 2.96 (*) 3.87 - 5.11 MIL/uL   Hemoglobin 9.6 (*) 12.0 - 15.0 g/dL   Comment: DELTA CHECK NOTED     REPEATED TO VERIFY   HCT 28.1 (*) 36.0 - 46.0 %   MCV 94.9  78.0 - 100.0 fL   MCH 32.4  26.0 - 34.0 pg   MCHC 34.2  30.0 - 36.0 g/dL   RDW 13.8  11.5 - 15.5 %   Platelets 229  150 - 400 K/uL   Comment: DELTA CHECK NOTED     REPEATED TO VERIFY  BASIC METABOLIC PANEL     Status: Abnormal   Collection Time    06/22/13  7:30 AM      Result Value Range   Sodium 141  137 - 147 mEq/L   Potassium 4.9  3.7 - 5.3 mEq/L   Chloride 107  96 - 112 mEq/L   CO2 25  19 - 32 mEq/L   Glucose, Bld 96  70 - 99 mg/dL   BUN 17  6 - 23 mg/dL   Creatinine, Ser 0.61  0.50 - 1.10 mg/dL   Comment: DELTA CHECK NOTED   Calcium 8.1 (*) 8.4 - 10.5 mg/dL   GFR calc non Af Amer 89 (*) >90 mL/min   GFR calc Af Amer >90  >90 mL/min   Comment: (NOTE)     The eGFR has been calculated using the CKD EPI equation.     This calculation has not been validated in all clinical situations.     eGFR's persistently <90 mL/min signify possible Chronic Kidney     Disease.  CBC     Status: Abnormal   Collection Time    06/23/13  4:57 AM      Result Value Range   WBC 6.7  4.0 -  10.5 K/uL   RBC 2.84 (*) 3.87 - 5.11 MIL/uL   Hemoglobin 8.8 (*) 12.0 - 15.0 g/dL   HCT 26.9 (*) 36.0 - 46.0 %   MCV 94.7  78.0 - 100.0 fL   MCH 31.0  26.0 - 34.0 pg   MCHC 32.7  30.0 - 36.0 g/dL   RDW 13.5  11.5 - 15.5 %   Platelets 236  150 - 400 K/uL  BASIC METABOLIC PANEL     Status: Abnormal   Collection Time    06/23/13  4:57 AM      Result Value Range   Sodium 142  137 - 147 mEq/L   Potassium 4.1  3.7 - 5.3 mEq/L   Chloride 101  96 - 112 mEq/L   CO2 24  19 - 32 mEq/L   Glucose, Bld 78  70 - 99 mg/dL   BUN 9  6 - 23 mg/dL   Creatinine, Ser 0.43 (*) 0.50 - 1.10 mg/dL   Calcium 8.2 (*) 8.4 - 10.5 mg/dL   GFR calc non Af Amer >90  >90 mL/min   GFR calc Af Amer >90  >90 mL/min   Comment: (NOTE)     The eGFR has been calculated using the CKD EPI equation.     This calculation has not been validated in all clinical situations.     eGFR's persistently <90 mL/min signify possible Chronic Kidney     Disease.    Imaging / Studies: Dg Abd 2 Views  06/23/2013   CLINICAL DATA:  Small-bowel obstruction  EXAM: ABDOMEN - 2 VIEW  COMPARISON:  06/19/2013  FINDINGS: A nasogastric catheter is been placed. Postsurgical changes are also now seen. Scattered large and small bowel gas is noted. Mild small bowel dilatation is noted which may be related to a postoperative  ileus. Continued followup is recommended.  IMPRESSION: Mild small bowel dilatation following surgery. This may be related to a postoperative ileus. Continued followup is recommended.   Electronically Signed   By: Inez Catalina M.D.   On: 06/23/2013 08:49    Medications / Allergies: per chart  Antibiotics: Anti-infectives   Start     Dose/Rate Route Frequency Ordered Stop   06/21/13 0600  cefOXitin (MEFOXIN) 2 g in dextrose 5 % 50 mL IVPB  Status:  Discontinued     2 g 100 mL/hr over 30 Minutes Intravenous On call to O.R. 06/20/13 1735 06/20/13 1736   06/20/13 2000  ceFAZolin (ANCEF) IVPB 1 g/50 mL premix     1 g 100 mL/hr over 30 Minutes Intravenous Every 6 hours 06/20/13 1735 06/21/13 0827   06/20/13 1415  cefOXitin (MEFOXIN) 2 g in dextrose 5 % 50 mL IVPB  Status:  Discontinued     2 g 100 mL/hr over 30 Minutes Intravenous To Surgery 06/20/13 1400 06/20/13 1700      Assessment/Plan  POD #3 s/p Ex Lap, LOA, SBR, Left inguinal hernia repair(Dr. Ninfa Linden) Post-op ileus -await bowel function -continue NGT to low intermittent suction -pain control, antiemetics -SCDs, lovenox -IS -mobilize  -add protonix(home med)  Erby Pian, [facility] Surgery Pager [phone] Office [phone]  06/23/2013 9:05 AM

## 2013-06-23 NOTE — OR Nursing (Signed)
OR Record updated on 06/23/13 @ 0853 to accurately reflect operative procedure.

## 2013-06-24 ENCOUNTER — Encounter (HOSPITAL_COMMUNITY): Payer: Self-pay | Admitting: Surgery

## 2013-06-24 NOTE — Progress Notes (Signed)
4 Days Post-Op  Subjective: Some gas this Am, not much but some, still distended.  Objective: Vital signs in last 24 hours: Temp:  [98.1 F (36.7 C)-98.9 F (37.2 C)] 98.1 F (36.7 C) (01/27 0540) Pulse Rate:  [91-98] 96 (01/27 0540) Resp:  [16-18] 18 (01/27 0540) BP: (137-156)/(65-75) 156/75 mmHg (01/27 0540) SpO2:  [90 %-93 %] 90 % (01/27 0540) Last BM Date: 06/17/13 NG back in 06/22/13 1000 from NG yesterday Afebrile, VSS Labs OK yesterday Intake/Output from previous day: Jul 17, 2022 0701 - 01/27 0700 In: 2558.3 [I.V.:2558.3] Out: 2150 [Urine:1150; Emesis/NG output:1000] Intake/Output this shift:    General appearance: alert, cooperative and no distress Resp: few rales at the base. GI: distended, BS hyperactive, No BM, 1 episode of flatus.  Incision looks fine.  Lab Results:   Recent Labs  06/22/13 0730 2013-07-17 0457  WBC 6.2 6.7  HGB 9.6* 8.8*  HCT 28.1* 26.9*  PLT 229 236    BMET  Recent Labs  06/22/13 0730 2013-07-17 0457  NA 141 142  K 4.9 4.1  CL 107 101  CO2 25 24  GLUCOSE 96 78  BUN 17 9  CREATININE 0.61 0.43*  CALCIUM 8.1* 8.2*   PT/INR No results found for this basename: LABPROT, INR,  in the last 72 hours   Recent Labs Lab 06/19/13 1745  AST 22  ALT 13  ALKPHOS 81  BILITOT 0.4  PROT 7.4  ALBUMIN 3.9     Lipase     Component Value Date/Time   LIPASE 44 12/10/2010 1500     Studies/Results: Dg Abd 2 Views  07/17/13   CLINICAL DATA:  Small-bowel obstruction  EXAM: ABDOMEN - 2 VIEW  COMPARISON:  06/19/2013  FINDINGS: A nasogastric catheter is been placed. Postsurgical changes are also now seen. Scattered large and small bowel gas is noted. Mild small bowel dilatation is noted which may be related to a postoperative ileus. Continued followup is recommended.  IMPRESSION: Mild small bowel dilatation following surgery. This may be related to a postoperative ileus. Continued followup is recommended.   Electronically Signed   By: Inez Catalina  M.D.   On: 2013-07-17 08:49    Medications: . enoxaparin (LOVENOX) injection  40 mg Subcutaneous Q24H  . pantoprazole (PROTONIX) IV  40 mg Intravenous Q12H   . 0.9 % NaCl with KCl 20 mEq / L 125 mL/hr at 06/24/13 2585   Prior to Admission medications   Medication Sig Start Date End Date Taking? Authorizing Provider  acetaminophen (TYLENOL) 500 MG tablet Take 1,000 mg by mouth every 6 (six) hours as needed. Pain   Yes Historical Provider, MD  pantoprazole (PROTONIX) 40 MG tablet Take 1 tablet (40 mg total) by mouth 2 (two) times daily. For acid reflux 06/16/13  Yes Kathyrn Drown, MD  predniSONE (DELTASONE) 20 MG tablet Take 20 mg by mouth daily with breakfast. Take 3 tablets first 3 days, 2 tablets for next 3 days, and 1 tablet for the next 3 days   Yes Historical Provider, MD     Assessment/Plan LIH with SBO (1/3-1/7 [facility]) Readmitted 06/19/13 with PSBO small bowel obstruction, left inguinal hernia. S/p Exploratory laparotomy, lysis of adhesions, SB resection and repair of LIH 06/20/13, Dr. Coralie Keens Post op ileus GERD ANA positive/arthritis Hx of partial hysterectomy age 66/uterine cancer. Hx of tobacco use age [age] (On prednisone preop)  Plan:  The Ng is functioning normally, she said she has some flatus this Am, she is walking and using IS.  She just needs some time, I will recheck her labs and prealbumin tomorrow.  If she doesn't open up soon we could consider TNA,but I don't think we are at that point yet.       LOS: 5 days    Abdirahim Flavell 06/24/2013

## 2013-06-24 NOTE — Progress Notes (Signed)
Small amount of blood noted in NGT. Pt/husband concerned. Modena Jansky, [place] notified.

## 2013-06-24 NOTE — Progress Notes (Signed)
Not much activity yet.  Hypoactive bowel sounds.  Continue NGT.  Kathryne Eriksson. Dahlia Bailiff, MD, [place] [phone] [facility] Surgery

## 2013-06-25 LAB — COMPREHENSIVE METABOLIC PANEL
ALT: 9 U/L (ref 0–35)
AST: 16 U/L (ref 0–37)
Albumin: 2.4 g/dL — ABNORMAL LOW (ref 3.5–5.2)
Alkaline Phosphatase: 68 U/L (ref 39–117)
BUN: 5 mg/dL — ABNORMAL LOW (ref 6–23)
CO2: 20 mEq/L (ref 19–32)
Calcium: 8.2 mg/dL — ABNORMAL LOW (ref 8.4–10.5)
Chloride: 104 mEq/L (ref 96–112)
Creatinine, Ser: 0.39 mg/dL — ABNORMAL LOW (ref 0.50–1.10)
GFR calc Af Amer: >90 mL/min (ref >90)
GFR calc non Af Amer: >90 mL/min (ref >90)
Glucose, Bld: 68 mg/dL — ABNORMAL LOW (ref 70–99)
Potassium: 3.6 mEq/L — ABNORMAL LOW (ref 3.7–5.3)
Sodium: 142 mEq/L (ref 137–147)
Total Bilirubin: 0.4 mg/dL (ref 0.3–1.2)
Total Protein: 5.9 g/dL — ABNORMAL LOW (ref 6.0–8.3)

## 2013-06-25 LAB — CBC
HCT: 28.6 % — ABNORMAL LOW (ref 36.0–46.0)
Hemoglobin: 9.6 g/dL — ABNORMAL LOW (ref 12.0–15.0)
MCH: 31.3 pg (ref 26.0–34.0)
MCHC: 33.6 g/dL (ref 30.0–36.0)
MCV: 93.2 fL (ref 78.0–100.0)
Platelets: 281 10*3/uL (ref 150–400)
RBC: 3.07 MIL/uL — ABNORMAL LOW (ref 3.87–5.11)
RDW: 13.5 % (ref 11.5–15.5)
WBC: 7.6 10*3/uL (ref 4.0–10.5)

## 2013-06-25 LAB — MAGNESIUM: Magnesium: 1.5 mg/dL (ref 1.5–2.5)

## 2013-06-25 LAB — PREALBUMIN: Prealbumin: 6.7 mg/dL — ABNORMAL LOW (ref 17.0–34.0)

## 2013-06-25 MED ORDER — POTASSIUM CHLORIDE IN NACL 40-0.9 MEQ/L-% IV SOLN
INTRAVENOUS | Status: DC
  Administered 2013-06-25 (×2): via INTRAVENOUS
  Administered 2013-06-25: 75 mL/h via INTRAVENOUS
  Administered 2013-06-26 – 2013-06-30 (×5): via INTRAVENOUS
  Filled 2013-06-25 (×12): qty 1000

## 2013-06-25 NOTE — Progress Notes (Signed)
Agree with clamping NGT because patient is having much more flatus.   May be able to remove by tomorrow.  Kathryne Eriksson. Dahlia Bailiff, MD, [place] [phone] [facility] Surgery

## 2013-06-25 NOTE — Progress Notes (Signed)
5 Days Post-Op  Subjective: Small amount of flatus last PM, not much.  She walked hall 3 times yesterday.  Still a little distended, only 370ml from NG yesterday.  Objective: Vital signs in last 24 hours: Temp:  [98.2 F (36.8 C)-98.5 F (36.9 C)] 98.2 F (36.8 C) (01/28 0542) Pulse Rate:  [85-100] 89 (01/28 0542) Resp:  [16-20] 20 (01/28 0542) BP: (147-149)/(74-79) 149/79 mmHg (01/28 0542) SpO2:  [91 %-93 %] 92 % (01/28 0542) Last BM Date: 06/17/13 325 from NG, Afebrile, VSS K+3.6 Labs OK otherwise Intake/Output from previous day: 01/27 0701 - 01/28 0700 In: 2207.3 [I.V.:2207.3] Out: 2675 [Urine:2350; Emesis/NG output:325] Intake/Output this shift:    General appearance: alert, cooperative and no distress Resp: clear to auscultation bilaterally GI: soft, still distended, but soft.  Incision ok, BS present still hyperactive.  Lab Results:   Recent Labs  July 17, 2013 0457 06/25/13 0534  WBC 6.7 7.6  HGB 8.8* 9.6*  HCT 26.9* 28.6*  PLT 236 281    BMET  Recent Labs  17-Jul-2013 0457 06/25/13 0534  NA 142 142  K 4.1 3.6*  CL 101 104  CO2 24 20  GLUCOSE 78 68*  BUN 9 5*  CREATININE 0.43* 0.39*  CALCIUM 8.2* 8.2*   PT/INR No results found for this basename: LABPROT, INR,  in the last 72 hours   Recent Labs Lab 06/19/13 1745 06/25/13 0534  AST 22 16  ALT 13 9  ALKPHOS 81 68  BILITOT 0.4 0.4  PROT 7.4 5.9*  ALBUMIN 3.9 2.4*     Lipase     Component Value Date/Time   LIPASE 44 12/10/2010 1500     Studies/Results: Dg Abd 2 Views  2013-07-17   CLINICAL DATA:  Small-bowel obstruction  EXAM: ABDOMEN - 2 VIEW  COMPARISON:  06/19/2013  FINDINGS: A nasogastric catheter is been placed. Postsurgical changes are also now seen. Scattered large and small bowel gas is noted. Mild small bowel dilatation is noted which may be related to a postoperative ileus. Continued followup is recommended.  IMPRESSION: Mild small bowel dilatation following surgery. This may be  related to a postoperative ileus. Continued followup is recommended.   Electronically Signed   By: Inez Catalina M.D.   On: 07-17-2013 08:49    Medications: . enoxaparin (LOVENOX) injection  40 mg Subcutaneous Q24H  . pantoprazole (PROTONIX) IV  40 mg Intravenous Q12H    Assessment/Plan LIH with SBO (1/3-1/7 [facility])  Readmitted 06/19/13 with PSBO  small bowel obstruction, left inguinal hernia. S/p Exploratory laparotomy, lysis of adhesions, SB resection and repair of LIH 06/20/13, Dr. Coralie Keens  Post op ileus  GERD  ANA positive/arthritis  Hx of partial hysterectomy age 89/uterine cancer.  Hx of tobacco use age [age] (On prednisone preop)   Plan:  NG clamping trials for 24 hours, continue to mobilize, hope to get NG out tomorrow and start clears.  I will increase K+ in her IV, and then decrease fluid rate.  Recheck BMP in AM.  LOS: 6 days    Chaston Bradburn 06/25/2013

## 2013-06-26 LAB — BASIC METABOLIC PANEL
BUN: 5 mg/dL — ABNORMAL LOW (ref 6–23)
CO2: 18 mEq/L — ABNORMAL LOW (ref 19–32)
Calcium: 8.4 mg/dL (ref 8.4–10.5)
Chloride: 102 mEq/L (ref 96–112)
Creatinine, Ser: 0.37 mg/dL — ABNORMAL LOW (ref 0.50–1.10)
GFR calc Af Amer: >90 mL/min (ref >90)
GFR calc non Af Amer: >90 mL/min (ref >90)
Glucose, Bld: 65 mg/dL — ABNORMAL LOW (ref 70–99)
Potassium: 3.7 mEq/L (ref 3.7–5.3)
Sodium: 139 mEq/L (ref 137–147)

## 2013-06-26 MED ORDER — ACETAMINOPHEN 325 MG PO TABS: 650.0000 mg | ORAL_TABLET | Freq: Four times a day (QID) | ORAL | Status: DC | PRN

## 2013-06-26 MED ORDER — PANTOPRAZOLE SODIUM 40 MG PO TBEC
40.0000 mg | DELAYED_RELEASE_TABLET | Freq: Two times a day (BID) | ORAL | Status: DC
  Administered 2013-06-26 – 2013-07-01 (×11): 40 mg via ORAL
  Filled 2013-06-26 (×11): qty 1

## 2013-06-26 MED ORDER — OXYCODONE-ACETAMINOPHEN 5-325 MG PO TABS
1.0000 | ORAL_TABLET | ORAL | Status: DC | PRN
  Administered 2013-06-26: 1 via ORAL
  Administered 2013-06-26 – 2013-06-29 (×6): 2 via ORAL
  Administered 2013-06-29: 1 via ORAL
  Administered 2013-06-30 (×2): 2 via ORAL
  Filled 2013-06-26 (×8): qty 2
  Filled 2013-06-26: qty 1
  Filled 2013-06-26: qty 2

## 2013-06-26 NOTE — Progress Notes (Signed)
Got a little sick when she ate beef broth.  No vomiting.  Will reassess in the AM.  Kathryne Eriksson. Dahlia Bailiff, MD, [place] [phone] [facility] Surgery

## 2013-06-26 NOTE — Progress Notes (Signed)
6 Days Post-Op  Subjective: Passed gas, and BM this Am.  Tolerated NG clamping yesterday.  Objective: Vital signs in last 24 hours: Temp:  [97.5 F (36.4 C)-98.5 F (36.9 C)] 98.5 F (36.9 C) (01/29 0538) Pulse Rate:  [93-97] 97 (01/29 0538) Resp:  [16-18] 18 (01/29 0538) BP: (143-158)/(85-91) 143/91 mmHg (01/29 0538) SpO2:  [95 %-97 %] 97 % (01/29 0538) Last BM Date: 06/17/13 Npo +BM this AM, 10 ml from NG yesterday. Afebrile, BP up some BMP Ok K+ up to 3.7 Intake/Output from previous day: 01/28 0701 - 01/29 0700 In: 1043.8 [I.V.:1043.8] Out: 1460 [Urine:1450; Emesis/NG output:10] Intake/Output this shift:    General appearance: alert, cooperative and no distress Resp: clear to auscultation bilaterally GI: soft, still sore, a little distended still, + BS.  Lab Results:   Recent Labs  06/25/13 0534  WBC 7.6  HGB 9.6*  HCT 28.6*  PLT 281    BMET  Recent Labs  06/25/13 0534 06/26/13 0603  NA 142 139  K 3.6* 3.7  CL 104 102  CO2 20 18*  GLUCOSE 68* 65*  BUN 5* 5*  CREATININE 0.39* 0.37*  CALCIUM 8.2* 8.4   PT/INR No results found for this basename: LABPROT, INR,  in the last 72 hours   Recent Labs Lab 06/19/13 1745 06/25/13 0534  AST 22 16  ALT 13 9  ALKPHOS 81 68  BILITOT 0.4 0.4  PROT 7.4 5.9*  ALBUMIN 3.9 2.4*     Lipase     Component Value Date/Time   LIPASE 44 12/10/2010 1500     Studies/Results: No results found.  Medications: . enoxaparin (LOVENOX) injection  40 mg Subcutaneous Q24H  . pantoprazole (PROTONIX) IV  40 mg Intravenous Q12H    Assessment/Plan LIH with SBO (1/3-1/7 [facility])  Readmitted 06/19/13 with PSBO  small bowel obstruction, left inguinal hernia. S/p Exploratory laparotomy, lysis of adhesions, SB resection and repair of LIH 06/20/13, Dr. Coralie Keens  Post op ileus  GERD  ANA positive/arthritis  Hx of partial hysterectomy age 38/uterine cancer.  Hx of tobacco use age [age] (On prednisone preop)   Plan:   D/c NG, and start clears.  I told her to go slow with them and be sure she doesn't have any nausea with it.  Add oral pain medicine.  LOS: 7 days    Terri Harris 06/26/2013

## 2013-06-27 NOTE — Progress Notes (Addendum)
Excellent bowel sounds.  Passing flatus.  No other changes.  Terri Harris. Dahlia Bailiff, MD, [place] [phone] [facility] Surgery

## 2013-06-27 NOTE — Progress Notes (Signed)
7 Days Post-Op  Subjective: She didn't feel good yesterday, some nausea, didn't care for clear soups, feels a little better this Am. More flatus this AM Objective: Vital signs in last 24 hours: Temp:  [97.3 F (36.3 C)-97.7 F (36.5 C)] 97.5 F (36.4 C) (01/30 0500) Pulse Rate:  [84-94] 94 (01/30 0500) Resp:  [16-19] 18 (01/30 0500) BP: (144-161)/(70-76) 161/75 mmHg (01/30 0500) SpO2:  [96 %-98 %] 96 % (01/30 0500) Last BM Date: 06/26/13 Just one BM recorded yesterday, nothing PO recorded. Diet: clears BP still up minimally,   Intake/Output from previous day: 01/29 0701 - 01/30 0700 In: 1771.3 [I.V.:1771.3] Out: 3025 [Urine:3025] Intake/Output this shift:    General appearance: alert, cooperative, no distress and walking in the halls. Resp: clear to auscultation bilaterally GI: very few BS, minimally distended., incision is ok  Lab Results:   Recent Labs  06/25/13 0534  WBC 7.6  HGB 9.6*  HCT 28.6*  PLT 281    BMET  Recent Labs  06/25/13 0534 06/26/13 0603  NA 142 139  K 3.6* 3.7  CL 104 102  CO2 20 18*  GLUCOSE 68* 65*  BUN 5* 5*  CREATININE 0.39* 0.37*  CALCIUM 8.2* 8.4   PT/INR No results found for this basename: LABPROT, INR,  in the last 72 hours   Recent Labs Lab 06/25/13 0534  AST 16  ALT 9  ALKPHOS 68  BILITOT 0.4  PROT 5.9*  ALBUMIN 2.4*     Lipase     Component Value Date/Time   LIPASE 44 12/10/2010 1500     Studies/Results: No results found.  Medications: . enoxaparin (LOVENOX) injection  40 mg Subcutaneous Q24H  . pantoprazole  40 mg Oral BID    Assessment/Plan LIH with SBO (1/3-1/7 [facility])  Readmitted 06/19/13 with PSBO  small bowel obstruction, left inguinal hernia. S/p Exploratory laparotomy, lysis of adhesions, SB resection and repair of LIH 06/20/13, Dr. Coralie Keens  Post op ileus  GERD  ANA positive/arthritis  Hx of partial hysterectomy age 73/uterine cancer.  Hx of tobacco use age [age] (On prednisone  preop)  Plan:  Continue clears today, and see how she does.  Continue to mobilize.     LOS: 8 days    Marvia Troost 06/27/2013

## 2013-06-28 NOTE — Progress Notes (Signed)
8 Days Post-Op  Subjective: Feeling better today.  Not having nausea.  Passing gas.  Objective: Vital signs in last 24 hours: Temp:  [97.8 F (36.6 C)-98.2 F (36.8 C)] 98.2 F (36.8 C) (01/31 0532) Pulse Rate:  [90-104] 90 (01/31 0532) Resp:  [18-19] 18 (01/31 0532) BP: (133-144)/(76-82) 133/77 mmHg (01/31 0532) SpO2:  [97 %-98 %] 98 % (01/31 0532) Last BM Date: 06/26/13  Intake/Output from previous day: 01/30 0701 - 01/31 0700 In: 600 [I.V.:600] Out: 1050 [Urine:1050] Intake/Output this shift:    PE: General- In NAD Abdomen-soft, incision clean and intact, some bowel sounds.  Lab Results:  No results found for this basename: WBC, HGB, HCT, PLT,  in the last 72 hours BMET  Recent Labs  06/26/13 0603  NA 139  K 3.7  CL 102  CO2 18*  GLUCOSE 65*  BUN 5*  CREATININE 0.37*  CALCIUM 8.4   PT/INR No results found for this basename: LABPROT, INR,  in the last 72 hours Comprehensive Metabolic Panel:    Component Value Date/Time   NA 139 06/26/2013 0603   NA 142 06/25/2013 0534   K 3.7 06/26/2013 0603   K 3.6* 06/25/2013 0534   CL 102 06/26/2013 0603   CL 104 06/25/2013 0534   CO2 18* 06/26/2013 0603   CO2 20 06/25/2013 0534   BUN 5* 06/26/2013 0603   BUN 5* 06/25/2013 0534   CREATININE 0.37* 06/26/2013 0603   CREATININE 0.39* 06/25/2013 0534   GLUCOSE 65* 06/26/2013 0603   GLUCOSE 68* 06/25/2013 0534   CALCIUM 8.4 06/26/2013 0603   CALCIUM 8.2* 06/25/2013 0534   AST 16 06/25/2013 0534   AST 22 06/19/2013 1745   ALT 9 06/25/2013 0534   ALT 13 06/19/2013 1745   ALKPHOS 68 06/25/2013 0534   ALKPHOS 81 06/19/2013 1745   BILITOT 0.4 06/25/2013 0534   BILITOT 0.4 06/19/2013 1745   PROT 5.9* 06/25/2013 0534   PROT 7.4 06/19/2013 1745   ALBUMIN 2.4* 06/25/2013 0534   ALBUMIN 3.9 06/19/2013 1745     Studies/Results: No results found.  Anti-infectives: Anti-infectives   Start     Dose/Rate Route Frequency Ordered Stop   06/21/13 0600  cefOXitin (MEFOXIN) 2 g in dextrose 5 % 50  mL IVPB  Status:  Discontinued     2 g 100 mL/hr over 30 Minutes Intravenous On call to O.R. 06/20/13 1735 06/20/13 1736   06/20/13 2000  ceFAZolin (ANCEF) IVPB 1 g/50 mL premix     1 g 100 mL/hr over 30 Minutes Intravenous Every 6 hours 06/20/13 1735 06/21/13 0827   06/20/13 1415  cefOXitin (MEFOXIN) 2 g in dextrose 5 % 50 mL IVPB  Status:  Discontinued     2 g 100 mL/hr over 30 Minutes Intravenous To Surgery 06/20/13 1400 06/20/13 1700      Assessment Small bowel obstruction, left inguinal hernia. S/p Exploratory laparotomy, lysis of adhesions, SB resection and repair of LIH 06/20/13, Dr. Coralie Keens  Post op ileus-improving.    LOS: 9 days   Plan: Advance to full liquid diet.   Lauris Keepers J 06/28/2013

## 2013-06-29 NOTE — Progress Notes (Signed)
9 Days Post-Op  Subjective: Some distension after eating.  No BM yet.  Still passing gas.  Objective: Vital signs in last 24 hours: Temp:  [97.8 F (36.6 C)-97.9 F (36.6 C)] 97.9 F (36.6 C) (02/01 0604) Pulse Rate:  [80-85] 80 (02/01 0604) Resp:  [16] 16 (02/01 0604) BP: (127-131)/(70-74) 131/70 mmHg (02/01 0604) SpO2:  [95 %-96 %] 95 % (02/01 0604) Last BM Date: 06/26/13  Intake/Output from previous day: 01/31 0701 - 02/01 0700 In: 600 [I.V.:600] Out: -  Intake/Output this shift:    PE: General- In NAD Abdomen-soft, non-tender, no significant distension, incision clean and intact, bowel sounds present but slightly hypoactive  Lab Results:  No results found for this basename: WBC, HGB, HCT, PLT,  in the last 72 hours BMET No results found for this basename: NA, K, CL, CO2, GLUCOSE, BUN, CREATININE, CALCIUM,  in the last 72 hours PT/INR No results found for this basename: LABPROT, INR,  in the last 72 hours Comprehensive Metabolic Panel:    Component Value Date/Time   NA 139 06/26/2013 0603   NA 142 06/25/2013 0534   K 3.7 06/26/2013 0603   K 3.6* 06/25/2013 0534   CL 102 06/26/2013 0603   CL 104 06/25/2013 0534   CO2 18* 06/26/2013 0603   CO2 20 06/25/2013 0534   BUN 5* 06/26/2013 0603   BUN 5* 06/25/2013 0534   CREATININE 0.37* 06/26/2013 0603   CREATININE 0.39* 06/25/2013 0534   GLUCOSE 65* 06/26/2013 0603   GLUCOSE 68* 06/25/2013 0534   CALCIUM 8.4 06/26/2013 0603   CALCIUM 8.2* 06/25/2013 0534   AST 16 06/25/2013 0534   AST 22 06/19/2013 1745   ALT 9 06/25/2013 0534   ALT 13 06/19/2013 1745   ALKPHOS 68 06/25/2013 0534   ALKPHOS 81 06/19/2013 1745   BILITOT 0.4 06/25/2013 0534   BILITOT 0.4 06/19/2013 1745   PROT 5.9* 06/25/2013 0534   PROT 7.4 06/19/2013 1745   ALBUMIN 2.4* 06/25/2013 0534   ALBUMIN 3.9 06/19/2013 1745     Studies/Results: No results found.  Anti-infectives: Anti-infectives   Start     Dose/Rate Route Frequency Ordered Stop   06/21/13 0600   cefOXitin (MEFOXIN) 2 g in dextrose 5 % 50 mL IVPB  Status:  Discontinued     2 g 100 mL/hr over 30 Minutes Intravenous On call to O.R. 06/20/13 1735 06/20/13 1736   06/20/13 2000  ceFAZolin (ANCEF) IVPB 1 g/50 mL premix     1 g 100 mL/hr over 30 Minutes Intravenous Every 6 hours 06/20/13 1735 06/21/13 0827   06/20/13 1415  cefOXitin (MEFOXIN) 2 g in dextrose 5 % 50 mL IVPB  Status:  Discontinued     2 g 100 mL/hr over 30 Minutes Intravenous To Surgery 06/20/13 1400 06/20/13 1700      Assessment Small bowel obstruction, left inguinal hernia. S/p Exploratory laparotomy, lysis of adhesions, SB resection and repair of LIH 06/20/13, Dr. Coralie Keens  Post op ileus-slowly improving.     LOS: 10 days   Plan: Keep on same diet for now until post-prandial distension improves.   Terri Harris 06/29/2013

## 2013-06-29 NOTE — Progress Notes (Signed)
Peripheral IV leaking, pink at site, d/c'd.  IV attempted insertion x2, unsuccessful.  Per [place], [place], may leave out IV unless pt develops N/V.

## 2013-06-30 MED ORDER — ALUM & MAG HYDROXIDE-SIMETH 200-200-20 MG/5ML PO SUSP
30.0000 mL | ORAL | Status: DC | PRN
  Administered 2013-06-30 – 2013-07-01 (×2): 30 mL via ORAL
  Filled 2013-06-30 (×2): qty 30

## 2013-06-30 MED ORDER — POLYETHYLENE GLYCOL 3350 17 G PO PACK
17.0000 g | PACK | Freq: Two times a day (BID) | ORAL | Status: DC
  Administered 2013-06-30 – 2013-07-01 (×3): 17 g via ORAL
  Filled 2013-06-30 (×4): qty 1

## 2013-06-30 NOTE — Progress Notes (Signed)
10 Days Post-Op  Subjective: First BM yesterday.  On full liquids.  Walking the halls this AM.  Objective: Vital signs in last 24 hours: Temp:  [97.9 F (36.6 C)-98.3 F (36.8 C)] 97.9 F (36.6 C) (02/02 0635) Pulse Rate:  [68-87] 85 (02/02 0635) Resp:  [17-18] 17 (02/02 0635) BP: (108-124)/(67-77) 108/74 mmHg (02/02 0635) SpO2:  [95 %-100 %] 100 % (02/02 0635) Last BM Date: 06/29/13 + BM yesterday, nothing po recorded  Full liquid diet Afebrile, VSS No labs since 1/29 Intake/Output from previous day: 02/01 0701 - 02/02 0700 In: 2580 [I.V.:2580] Out: -  Intake/Output this shift:    General appearance: alert, cooperative and no distress Resp: clear to auscultation bilaterally GI: soft, +BS +BM, tolerating fulls. incision OK  Lab Results:  No results found for this basename: WBC, HGB, HCT, PLT,  in the last 72 hours  BMET No results found for this basename: NA, K, CL, CO2, GLUCOSE, BUN, CREATININE, CALCIUM,  in the last 72 hours PT/INR No results found for this basename: LABPROT, INR,  in the last 72 hours   Recent Labs Lab 06/25/13 0534  AST 16  ALT 9  ALKPHOS 68  BILITOT 0.4  PROT 5.9*  ALBUMIN 2.4*     Lipase     Component Value Date/Time   LIPASE 44 12/10/2010 1500     Studies/Results: No results found.  Medications: . enoxaparin (LOVENOX) injection  40 mg Subcutaneous Q24H  . pantoprazole  40 mg Oral BID    Assessment/Plan LIH with SBO (1/3-1/7 [facility])  Readmitted 06/19/13 with PSBO  small bowel obstruction, left inguinal hernia. S/p Exploratory laparotomy, lysis of adhesions, SB resection and repair of LIH 06/20/13, Dr. Coralie Keens  Post op ileus  GERD  ANA positive/arthritis  Hx of partial hysterectomy age 73/uterine cancer.  Hx of tobacco use age [age] (On prednisone preop)   Plan:  Low fiber diet, Miralax, recheck labs and hopefully home tomorrow   LOS: 11 days    Terri Harris 06/30/2013

## 2013-06-30 NOTE — Progress Notes (Signed)
Agree with above, wound clean, will dc staples prior to discharge

## 2013-07-01 LAB — CBC
HCT: 29.5 % — ABNORMAL LOW (ref 36.0–46.0)
Hemoglobin: 10.1 g/dL — ABNORMAL LOW (ref 12.0–15.0)
MCH: 31.8 pg (ref 26.0–34.0)
MCHC: 34.2 g/dL (ref 30.0–36.0)
MCV: 92.8 fL (ref 78.0–100.0)
Platelets: 373 10*3/uL (ref 150–400)
RBC: 3.18 MIL/uL — ABNORMAL LOW (ref 3.87–5.11)
RDW: 14.6 % (ref 11.5–15.5)
WBC: 5 10*3/uL (ref 4.0–10.5)

## 2013-07-01 LAB — BASIC METABOLIC PANEL
BUN: 6 mg/dL (ref 6–23)
CO2: 25 mEq/L (ref 19–32)
Calcium: 8.4 mg/dL (ref 8.4–10.5)
Chloride: 103 mEq/L (ref 96–112)
Creatinine, Ser: 0.42 mg/dL — ABNORMAL LOW (ref 0.50–1.10)
GFR calc Af Amer: >90 mL/min (ref >90)
GFR calc non Af Amer: >90 mL/min (ref >90)
Glucose, Bld: 93 mg/dL (ref 70–99)
Potassium: 4 mEq/L (ref 3.7–5.3)
Sodium: 140 mEq/L (ref 137–147)

## 2013-07-01 MED ORDER — POLYETHYLENE GLYCOL 3350 17 G PO PACK: PACK | ORAL | Status: DC

## 2013-07-01 MED ORDER — OXYCODONE HCL 5 MG PO TABS: 5.0000 mg | ORAL_TABLET | ORAL | Status: DC | PRN

## 2013-07-01 NOTE — Progress Notes (Signed)
Dc home, staples out

## 2013-07-01 NOTE — Progress Notes (Signed)
Staples removed, wound approximated, benzoin applied along with steri-strips as per order.

## 2013-07-01 NOTE — Progress Notes (Signed)
11 Days Post-Op  Subjective: She feels better and tolerating low fiber supper, no breakfast yet.  No BM yesterday.  Objective: Vital signs in last 24 hours: Temp:  [97.2 F (36.2 C)-98.2 F (36.8 C)] 98.2 F (36.8 C) (02/03 0451) Pulse Rate:  [79-88] 79 (02/03 0451) Resp:  [18] 18 (02/03 0451) BP: (109-128)/(54-70) 109/70 mmHg (02/03 0451) SpO2:  [97 %-99 %] 98 % (02/03 0451) Last BM Date: 06/30/13 480 PO, low fiber diet No BM recorded. Afebrile, VSS CBC is normal Intake/Output from previous day: 02/02 0701 - 02/03 0700 In: 980 [P.O.:480; I.V.:500] Out: 240 [Urine:240] Intake/Output this shift:    General appearance: alert, cooperative and no distress Resp: clear to auscultation bilaterally GI: soft, + BS, passing gas, wound, OK  Lab Results:   Recent Labs  07/01/13 0730  WBC 5.0  HGB 10.1*  HCT 29.5*  PLT 373    BMET No results found for this basename: NA, K, CL, CO2, GLUCOSE, BUN, CREATININE, CALCIUM,  in the last 72 hours PT/INR No results found for this basename: LABPROT, INR,  in the last 72 hours   Recent Labs Lab 06/25/13 0534  AST 16  ALT 9  ALKPHOS 68  BILITOT 0.4  PROT 5.9*  ALBUMIN 2.4*     Lipase     Component Value Date/Time   LIPASE 44 12/10/2010 1500     Studies/Results: No results found.  Medications: . enoxaparin (LOVENOX) injection  40 mg Subcutaneous Q24H  . pantoprazole  40 mg Oral BID  . polyethylene glycol  17 g Oral BID    Assessment/Plan LIH with SBO (1/3-1/7 [facility])  Readmitted 06/19/13 with PSBO  small bowel obstruction, left inguinal hernia. S/p Exploratory laparotomy, lysis of adhesions, SB resection and repair of LIH 06/20/13, Dr. Coralie Keens  Post op ileus  GERD  ANA positive/arthritis  Hx of partial hysterectomy age 44/uterine cancer.  Hx of tobacco use age [age] (On prednisone preop)   Plan:  I want to give her some more time to have a BM before going home..  D/c staples and steri strip the wound.  I  will check on her after lunch and let her go home if she's doing well and has had another BM.  She has had her BM so I told her she could go after lunch.  LOS: 12 days    Hildy Nicholl 07/01/2013

## 2013-07-01 NOTE — Progress Notes (Signed)
DC instructions gone over with patient, questions answered, verbalized understanding.  Pt.  Given prescription for pain medication.  Transported to front of hospital to be taken home by husband.  Pt. In good condition upon leaving [facility].

## 2013-07-01 NOTE — Discharge Instructions (Signed)
[facility]      [facility] [phone]  OPEN ABDOMINAL SURGERY: POST OP INSTRUCTIONS  Always review your discharge instruction sheet given to you by the facility where your surgery was performed.  IF YOU HAVE DISABILITY OR FAMILY LEAVE FORMS, YOU MUST BRING THEM TO THE OFFICE FOR PROCESSING.  PLEASE DO NOT GIVE THEM TO YOUR DOCTOR.  1. A prescription for pain medication may be given to you upon discharge.  Take your pain medication as prescribed, if needed.  If narcotic pain medicine is not needed, then you may take acetaminophen (Tylenol) or ibuprofen (Advil) as needed. 2. Take your usually prescribed medications unless otherwise directed. 3. If you need a refill on your pain medication, please contact your pharmacy. They will contact our office to request authorization.  Prescriptions will not be filled after 5pm or on week-ends. 4. You should follow a light diet the first few days after arrival home, such as soup and crackers, pudding, etc.unless your doctor has advised otherwise. A high-fiber, low fat diet can be resumed as tolerated.   Be sure to include lots of fluids daily. Most patients will experience some swelling and bruising on the chest and neck area.  Ice packs will help.  Swelling and bruising can take several days to resolve 5. Most patients will experience some swelling and bruising in the area of the incision. Ice pack will help. Swelling and bruising can take several days to resolve..  6. It is common to experience some constipation if taking pain medication after surgery.  Increasing fluid intake and taking a stool softener will usually help or prevent this problem from occurring.  A mild laxative (Milk of Magnesia or Miralax) should be taken according to package directions if there are no bowel movements after 48 hours. 7.  You may have steri-strips (small skin tapes) in place directly over the incision.  These strips should be left on the skin for 7-10 days.  If your  surgeon used skin glue on the incision, you may shower in 24 hours.  The glue will flake off over the next 2-3 weeks.  Any sutures or staples will be removed at the office during your follow-up visit. You may find that a light gauze bandage over your incision may keep your staples from being rubbed or pulled. You may shower and replace the bandage daily. 8. ACTIVITIES:  You may resume regular (light) daily activities beginning the next day--such as daily self-care, walking, climbing stairs--gradually increasing activities as tolerated.  You may have sexual intercourse when it is comfortable.  Refrain from any heavy lifting or straining until approved by your doctor. a. You may drive when you no longer are taking prescription pain medication, you can comfortably wear a seatbelt, and you can safely maneuver your car and apply brakes b. Return to Work: ___________________________________ 9. You should see your doctor in the office for a follow-up appointment approximately two weeks after your surgery.  Make sure that you call for this appointment within a day or two after you arrive home to insure a convenient appointment time. OTHER INSTRUCTIONS:  _____________________________________________________________ _____________________________________________________________  WHEN TO CALL YOUR DOCTOR: 1. Fever over 101.0 2. Inability to urinate 3. Nausea and/or vomiting 4. Extreme swelling or bruising 5. Continued bleeding from incision. 6. Increased pain, redness, or drainage from the incision. 7. Difficulty swallowing or breathing 8. Muscle cramping or spasms. 9. Numbness or tingling in hands or feet or around lips.  The clinic staff is available to   answer your questions during regular business hours.  Please don't hesitate to call and ask to speak to one of the nurses if you have concerns.  For further questions, please visit [email or web address]   

## 2013-07-02 ENCOUNTER — Encounter (HOSPITAL_COMMUNITY): Payer: Self-pay | Admitting: General Surgery

## 2013-07-02 DIAGNOSIS — K219 Gastro-esophageal reflux disease without esophagitis: Secondary | ICD-10-CM

## 2013-07-02 DIAGNOSIS — M1990 Unspecified osteoarthritis, unspecified site: Secondary | ICD-10-CM

## 2013-07-02 DIAGNOSIS — K9189 Other postprocedural complications and disorders of digestive system: Secondary | ICD-10-CM

## 2013-07-02 DIAGNOSIS — K567 Ileus, unspecified: Secondary | ICD-10-CM | POA: Diagnosis not present

## 2013-07-02 DIAGNOSIS — Z87891 Personal history of nicotine dependence: Secondary | ICD-10-CM

## 2013-07-02 HISTORY — DX: Personal history of nicotine dependence: Z87.891

## 2013-07-02 HISTORY — DX: Ileus, unspecified: K56.7

## 2013-07-02 HISTORY — DX: Unspecified osteoarthritis, unspecified site: M19.90

## 2013-07-02 HISTORY — DX: Other postprocedural complications and disorders of digestive system: K91.89

## 2013-07-02 NOTE — Discharge Summary (Signed)
Physician Discharge Summary  Patient ID: Terri Harris MRN: [id number] DOB/AGE: Oct 22, 1940 73 y.o.  Admit date: 06/19/2013 Discharge date: 07/02/2013  Admission Diagnoses:  Recurrent Small bowel obstruction LIH with SBO (Hospitalized 1/3-06/04/13 [facility]) GERD  ANA positive/arthritis  Hx of partial hysterectomy age 35/uterine cancer.  Hx of tobacco use age [age] (On prednisone preop)  Discharge Diagnoses:  LIH with SBO (Hospitalized 1/3-06/04/13 [facility]) Post op Ileus GERD  ANA positive/arthritis  Hx of partial hysterectomy age 35/uterine cancer.  Hx of tobacco use age [age] (On prednisone preop)   Active Problems:   SBO (small bowel obstruction)   Ileus, postoperative   COPD (chronic obstructive pulmonary disease)   Hx of tobacco use, presenting hazards to health   Arthritis   GERD (gastroesophageal reflux disease)   PROCEDURES:  Exploratory laparotomy, lysis of adhesions, SB resection and repair of LIH 06/20/13, Dr. Bill Salinas Course: Terri Harris is a 73 y.o. female who presents to the office with Nausea and vomiting. She has had intermittent episodes of this since November. She has been hospitalized [facility] once for similar symptoms she was diagnosed with an fat-containing inguinal hernia and a small bowel obstruction. This resolved with NG decompression. Her only abdominal surgeries a hysterectomy for cervical cancer many years ago. Her last colonoscopy was in 2013 and normal except for some small polyps which were removed. She does have a esophageal stricture that is dilated occasionally. She is scheduled for this tomorrow. She is taking prednisone acutely for her back pain. She was admitted and taken to the OR by Dr. Ninfa Linden post op she had an ileus and was slow to have a resumption of her bowel function.  Her staples were removed and she was steri stripped before going home.  She will follow up with Dr. Ninfa Linden in 2 weeks. Condition on d/c:   Improved   Disposition: 01-Home or Self Care     Medication List    STOP taking these medications       predniSONE 20 MG tablet  Commonly known as:  DELTASONE      TAKE these medications       acetaminophen 500 MG tablet  Commonly known as:  TYLENOL  Take 1,000 mg by mouth every 6 (six) hours as needed. Pain     oxyCODONE 5 MG immediate release tablet  Commonly known as:  ROXICODONE  Take 1-2 tablets (5-10 mg total) by mouth every 4 (four) hours as needed for severe pain.     pantoprazole 40 MG tablet  Commonly known as:  PROTONIX  Take 1 tablet (40 mg total) by mouth 2 (two) times daily. For acid reflux     polyethylene glycol packet  Commonly known as:  MIRALAX / GLYCOLAX  Use daily as needed, you should have 1-2 smooth bowel movements per day.       Follow-up Information   Follow up with [facility], MD. Schedule an appointment as soon as possible for a visit in 2 weeks.   Specialty:  General Surgery   Contact information:   [address] [place] [address] [phone]       Signed: Earnstine Regal 07/02/2013, 3:54 PM

## 2013-07-22 ENCOUNTER — Encounter (INDEPENDENT_AMBULATORY_CARE_PROVIDER_SITE_OTHER): Payer: Self-pay | Admitting: Surgery

## 2013-07-22 ENCOUNTER — Ambulatory Visit (INDEPENDENT_AMBULATORY_CARE_PROVIDER_SITE_OTHER): Payer: Medicare Other | Admitting: Surgery

## 2013-07-22 VITALS — BP 130/84 | HR 71 | Temp 97.5°F | Resp 16 | Ht 67.0 in | Wt 124.2 lb

## 2013-07-22 DIAGNOSIS — Z09 Encounter for follow-up examination after completed treatment for conditions other than malignant neoplasm: Secondary | ICD-10-CM

## 2013-07-22 NOTE — Progress Notes (Signed)
Subjective:     Patient ID: Terri Harris, female   DOB: [birth year], 73 y.o.   MRN: [id number]  HPI She is here for her first postop visit status post exploratory laparotomy with small bowel resection for a small bowel obstruction. I also repair her left inguinal hernia at the time of surgery although I could not use mesh. She is doing well has no complaints. She is eating well and there were bowel swell  Review of Systems     Objective:   Physical Exam On exam, her incision is well healed and there is no evidence of recurrent inguinal hernia    Assessment:     Patient stable postop     Plan:     She will refrain from heavy lifting for one more week. She may then slowly resume her normal activity. I will see her back as needed

## 2013-08-13 ENCOUNTER — Encounter: Payer: Self-pay | Admitting: Nurse Practitioner

## 2013-08-13 ENCOUNTER — Ambulatory Visit (INDEPENDENT_AMBULATORY_CARE_PROVIDER_SITE_OTHER): Payer: Medicare Other | Admitting: Nurse Practitioner

## 2013-08-13 VITALS — BP 126/82 | Temp 98.1°F | Ht 65.0 in | Wt 128.0 lb

## 2013-08-13 DIAGNOSIS — H6120 Impacted cerumen, unspecified ear: Secondary | ICD-10-CM

## 2013-08-13 DIAGNOSIS — J300 Vasomotor rhinitis: Secondary | ICD-10-CM

## 2013-08-13 DIAGNOSIS — J309 Allergic rhinitis, unspecified: Secondary | ICD-10-CM

## 2013-08-13 MED ORDER — AZITHROMYCIN 250 MG PO TABS: ORAL_TABLET | ORAL | Status: DC

## 2013-08-18 ENCOUNTER — Encounter: Payer: Self-pay | Admitting: Nurse Practitioner

## 2013-08-18 NOTE — Progress Notes (Signed)
Subjective:  Presents complaints of sinus symptoms that started about 3 days ago. Head congestion. Ear pressure. Ear pain at times. Difficulty hearing. No fever. Occasional cough. No sore throat. No wheezing.  Objective:   BP 126/82  Temp(Src) 98.1 F (36.7 C)  Ht 5\' 5"  (1.651 m)  Wt 128 lb (58.06 kg)  BMI 21.30 kg/m2 NAD. Alert, oriented. Initially both ears obscured with cerumen which was removed by the nurse by irrigation without difficulty. A small amount of cotton was also noted from patient using cotton-tipped applicators. The patient left the office before rechecking the ears. Pharynx injected with PND noted. Neck supple with mild soft anterior adenopathy. Lungs clear. Heart regular rate rhythm.  Assessment:Vasomotor rhinitis  Excess ear wax - Plan: Ear Lavage   Plan: Meds ordered this encounter  Medications  . azithromycin (ZITHROMAX Z-PAK) 250 MG tablet    Sig: Take 2 tablets (500 mg) on  Day 1,  followed by 1 tablet (250 mg) once daily on Days 2 through 5.    Dispense:  6 each    Refill:  0    Order Specific Question:  Supervising Provider    Answer:  Mikey Kirschner [[id number]]   OTC meds as directed for congestion. Patient advised not to use cotton tip applicators deep in the ear. Call back if symptoms worsen or persist.

## 2013-09-11 ENCOUNTER — Emergency Department (HOSPITAL_COMMUNITY): Payer: Medicare Other

## 2013-09-11 ENCOUNTER — Encounter (HOSPITAL_COMMUNITY): Payer: Self-pay | Admitting: Emergency Medicine

## 2013-09-11 ENCOUNTER — Inpatient Hospital Stay (HOSPITAL_COMMUNITY)
Admission: EM | Admit: 2013-09-11 | Discharge: 2013-09-14 | DRG: 351 | Disposition: A | Discharge status: Home / Self Care | Payer: Medicare Other | Attending: Surgery | Admitting: Surgery

## 2013-09-11 ENCOUNTER — Encounter (HOSPITAL_COMMUNITY): Admission: EM | Disposition: A | Discharge status: Home / Self Care | Payer: Self-pay | Source: Home / Self Care

## 2013-09-11 ENCOUNTER — Encounter: Payer: Medicare Other | Admitting: Nurse Practitioner

## 2013-09-11 ENCOUNTER — Encounter (HOSPITAL_COMMUNITY): Payer: Medicare Other | Admitting: Anesthesiology

## 2013-09-11 ENCOUNTER — Emergency Department (HOSPITAL_COMMUNITY): Payer: Medicare Other | Admitting: Anesthesiology

## 2013-09-11 DIAGNOSIS — K4030 Unilateral inguinal hernia, with obstruction, without gangrene, not specified as recurrent: Secondary | ICD-10-CM

## 2013-09-11 DIAGNOSIS — Z87891 Personal history of nicotine dependence: Secondary | ICD-10-CM | POA: Diagnosis not present

## 2013-09-11 DIAGNOSIS — J449 Chronic obstructive pulmonary disease, unspecified: Secondary | ICD-10-CM | POA: Diagnosis not present

## 2013-09-11 DIAGNOSIS — IMO0001 Reserved for inherently not codable concepts without codable children: Principal | ICD-10-CM | POA: Diagnosis present

## 2013-09-11 DIAGNOSIS — D62 Acute posthemorrhagic anemia: Secondary | ICD-10-CM | POA: Diagnosis not present

## 2013-09-11 DIAGNOSIS — E7800 Pure hypercholesterolemia, unspecified: Secondary | ICD-10-CM | POA: Diagnosis present

## 2013-09-11 DIAGNOSIS — R112 Nausea with vomiting, unspecified: Secondary | ICD-10-CM

## 2013-09-11 DIAGNOSIS — K560 Paralytic ileus: Secondary | ICD-10-CM | POA: Diagnosis not present

## 2013-09-11 DIAGNOSIS — K929 Disease of digestive system, unspecified: Secondary | ICD-10-CM | POA: Diagnosis not present

## 2013-09-11 DIAGNOSIS — R109 Unspecified abdominal pain: Secondary | ICD-10-CM | POA: Diagnosis not present

## 2013-09-11 DIAGNOSIS — Z8542 Personal history of malignant neoplasm of other parts of uterus: Secondary | ICD-10-CM

## 2013-09-11 DIAGNOSIS — K219 Gastro-esophageal reflux disease without esophagitis: Secondary | ICD-10-CM | POA: Diagnosis present

## 2013-09-11 DIAGNOSIS — M810 Age-related osteoporosis without current pathological fracture: Secondary | ICD-10-CM | POA: Diagnosis present

## 2013-09-11 DIAGNOSIS — K56609 Unspecified intestinal obstruction, unspecified as to partial versus complete obstruction: Secondary | ICD-10-CM | POA: Diagnosis not present

## 2013-09-11 DIAGNOSIS — Y849 Medical procedure, unspecified as the cause of abnormal reaction of the patient, or of later complication, without mention of misadventure at the time of the procedure: Secondary | ICD-10-CM | POA: Diagnosis not present

## 2013-09-11 DIAGNOSIS — K4130 Unilateral femoral hernia, with obstruction, without gangrene, not specified as recurrent: Secondary | ICD-10-CM | POA: Diagnosis present

## 2013-09-11 DIAGNOSIS — J4489 Other specified chronic obstructive pulmonary disease: Secondary | ICD-10-CM | POA: Diagnosis not present

## 2013-09-11 HISTORY — PX: INGUINAL HERNIA REPAIR: SHX194

## 2013-09-11 LAB — CBC WITH DIFFERENTIAL/PLATELET
Basophils Absolute: 0.1 10*3/uL (ref 0.0–0.1)
Basophils Relative: 0 % (ref 0–1)
Eosinophils Absolute: 0.1 10*3/uL (ref 0.0–0.7)
Eosinophils Relative: 1 % (ref 0–5)
HCT: 46.5 % — ABNORMAL HIGH (ref 36.0–46.0)
Hemoglobin: 15.4 g/dL — ABNORMAL HIGH (ref 12.0–15.0)
Lymphocytes Relative: 13 % (ref 12–46)
Lymphs Abs: 1.6 10*3/uL (ref 0.7–4.0)
MCH: 31.2 pg (ref 26.0–34.0)
MCHC: 33.1 g/dL (ref 30.0–36.0)
MCV: 94.3 fL (ref 78.0–100.0)
Monocytes Absolute: 0.8 10*3/uL (ref 0.1–1.0)
Monocytes Relative: 7 % (ref 3–12)
Neutro Abs: 9.2 10*3/uL — ABNORMAL HIGH (ref 1.7–7.7)
Neutrophils Relative %: 79 % — ABNORMAL HIGH (ref 43–77)
Platelets: 282 10*3/uL (ref 150–400)
RBC: 4.93 MIL/uL (ref 3.87–5.11)
RDW: 13.4 % (ref 11.5–15.5)
WBC: 11.7 10*3/uL — ABNORMAL HIGH (ref 4.0–10.5)

## 2013-09-11 LAB — COMPREHENSIVE METABOLIC PANEL
ALT: 9 U/L (ref 0–35)
AST: 19 U/L (ref 0–37)
Albumin: 4.6 g/dL (ref 3.5–5.2)
Alkaline Phosphatase: 92 U/L (ref 39–117)
BUN: 12 mg/dL (ref 6–23)
CO2: 26 mEq/L (ref 19–32)
Calcium: 10.1 mg/dL (ref 8.4–10.5)
Chloride: 100 mEq/L (ref 96–112)
Creatinine, Ser: 0.63 mg/dL (ref 0.50–1.10)
GFR calc Af Amer: >90 mL/min (ref >90)
GFR calc non Af Amer: 88 mL/min — ABNORMAL LOW (ref >90)
Glucose, Bld: 107 mg/dL — ABNORMAL HIGH (ref 70–99)
Potassium: 3.8 mEq/L (ref 3.7–5.3)
Sodium: 142 mEq/L (ref 137–147)
Total Bilirubin: 0.2 mg/dL — ABNORMAL LOW (ref 0.3–1.2)
Total Protein: 9.1 g/dL — ABNORMAL HIGH (ref 6.0–8.3)

## 2013-09-11 LAB — URINE MICROSCOPIC-ADD ON

## 2013-09-11 LAB — URINALYSIS, ROUTINE W REFLEX MICROSCOPIC
Bilirubin Urine: NEGATIVE
Glucose, UA: NEGATIVE mg/dL
Leukocytes, UA: NEGATIVE
Nitrite: NEGATIVE
Protein, ur: 30 mg/dL — AB
Specific Gravity, Urine: >1.03 — ABNORMAL HIGH (ref 1.005–1.030)
Urobilinogen, UA: 0.2 mg/dL (ref 0.0–1.0)
pH: 5.5 (ref 5.0–8.0)

## 2013-09-11 SURGERY — REPAIR, HERNIA, INGUINAL, INCARCERATED
Anesthesia: General | Site: Groin | Laterality: Left

## 2013-09-11 MED ORDER — FENTANYL CITRATE 0.05 MG/ML IJ SOLN
INTRAMUSCULAR | Status: AC
  Filled 2013-09-11: qty 2

## 2013-09-11 MED ORDER — HYDROMORPHONE HCL PF 1 MG/ML IJ SOLN
1.0000 mg | Freq: Once | INTRAMUSCULAR | Status: AC
  Administered 2013-09-11: 1 mg via INTRAVENOUS
  Filled 2013-09-11: qty 1

## 2013-09-11 MED ORDER — SUCCINYLCHOLINE CHLORIDE 20 MG/ML IJ SOLN
INTRAMUSCULAR | Status: DC | PRN
  Administered 2013-09-11: 110 mg via INTRAVENOUS

## 2013-09-11 MED ORDER — HYDROMORPHONE HCL PF 1 MG/ML IJ SOLN
0.2500 mg | INTRAMUSCULAR | Status: DC | PRN
  Administered 2013-09-11 (×4): 0.5 mg via INTRAVENOUS

## 2013-09-11 MED ORDER — ONDANSETRON HCL 4 MG PO TABS: 4.0000 mg | ORAL_TABLET | Freq: Four times a day (QID) | ORAL | Status: DC | PRN

## 2013-09-11 MED ORDER — ENOXAPARIN SODIUM 40 MG/0.4ML ~~LOC~~ SOLN
40.0000 mg | SUBCUTANEOUS | Status: DC
  Administered 2013-09-12 – 2013-09-14 (×3): 40 mg via SUBCUTANEOUS
  Filled 2013-09-11 (×4): qty 0.4

## 2013-09-11 MED ORDER — PROPOFOL 10 MG/ML IV BOLUS
INTRAVENOUS | Status: DC | PRN
  Administered 2013-09-11: 150 mg via INTRAVENOUS

## 2013-09-11 MED ORDER — IOHEXOL 300 MG/ML  SOLN
100.0000 mL | Freq: Once | INTRAMUSCULAR | Status: AC | PRN
  Administered 2013-09-11: 100 mL via INTRAVENOUS

## 2013-09-11 MED ORDER — ONDANSETRON HCL 4 MG/2ML IJ SOLN
INTRAMUSCULAR | Status: DC | PRN
  Administered 2013-09-11: 4 mg via INTRAVENOUS

## 2013-09-11 MED ORDER — BUPIVACAINE-EPINEPHRINE (PF) 0.5% -1:200000 IJ SOLN
INTRAMUSCULAR | Status: AC
  Filled 2013-09-11: qty 10

## 2013-09-11 MED ORDER — BSS IO SOLN
INTRAOCULAR | Status: AC
  Filled 2013-09-11: qty 15

## 2013-09-11 MED ORDER — HYDROMORPHONE HCL PF 1 MG/ML IJ SOLN
INTRAMUSCULAR | Status: AC
  Filled 2013-09-11: qty 1

## 2013-09-11 MED ORDER — LACTATED RINGERS IV SOLN
INTRAVENOUS | Status: DC | PRN
  Administered 2013-09-11: 21:00:00 via INTRAVENOUS

## 2013-09-11 MED ORDER — ONDANSETRON HCL 4 MG/2ML IJ SOLN: 4.0000 mg | Freq: Four times a day (QID) | INTRAMUSCULAR | Status: DC | PRN

## 2013-09-11 MED ORDER — PROMETHAZINE HCL 25 MG/ML IJ SOLN: 6.2500 mg | INTRAMUSCULAR | Status: DC | PRN

## 2013-09-11 MED ORDER — FENTANYL CITRATE 0.05 MG/ML IJ SOLN
INTRAMUSCULAR | Status: DC | PRN
  Administered 2013-09-11 (×2): 50 ug via INTRAVENOUS

## 2013-09-11 MED ORDER — ONDANSETRON HCL 4 MG/2ML IJ SOLN
4.0000 mg | Freq: Once | INTRAMUSCULAR | Status: AC
Start: 2013-09-11 — End: 2013-09-11
  Administered 2013-09-11: 4 mg via INTRAVENOUS
  Filled 2013-09-11: qty 2

## 2013-09-11 MED ORDER — POTASSIUM CHLORIDE IN NACL 20-0.9 MEQ/L-% IV SOLN
INTRAVENOUS | Status: DC
  Administered 2013-09-12 – 2013-09-14 (×5): via INTRAVENOUS
  Filled 2013-09-11 (×8): qty 1000

## 2013-09-11 MED ORDER — LIDOCAINE HCL (CARDIAC) 20 MG/ML IV SOLN
INTRAVENOUS | Status: DC | PRN
  Administered 2013-09-11: 100 mg via INTRAVENOUS

## 2013-09-11 MED ORDER — SODIUM CHLORIDE 0.9 % IV SOLN: INTRAVENOUS | Status: DC

## 2013-09-11 MED ORDER — SODIUM CHLORIDE 0.9 % IV BOLUS (SEPSIS)
700.0000 mL | Freq: Once | INTRAVENOUS | Status: AC
  Administered 2013-09-11: 700 mL via INTRAVENOUS

## 2013-09-11 MED ORDER — PROPOFOL 10 MG/ML IV BOLUS
INTRAVENOUS | Status: AC
  Filled 2013-09-11: qty 20

## 2013-09-11 MED ORDER — MIDAZOLAM HCL 2 MG/2ML IJ SOLN
INTRAMUSCULAR | Status: AC
  Filled 2013-09-11: qty 2

## 2013-09-11 MED ORDER — FENTANYL CITRATE 0.05 MG/ML IJ SOLN
INTRAMUSCULAR | Status: AC
  Filled 2013-09-11: qty 5

## 2013-09-11 MED ORDER — CEFAZOLIN SODIUM-DEXTROSE 2-3 GM-% IV SOLR
INTRAVENOUS | Status: AC
  Filled 2013-09-11: qty 50

## 2013-09-11 MED ORDER — BUPIVACAINE-EPINEPHRINE 0.5% -1:200000 IJ SOLN
INTRAMUSCULAR | Status: DC | PRN
  Administered 2013-09-11: 20 mL

## 2013-09-11 MED ORDER — 0.9 % SODIUM CHLORIDE (POUR BTL) OPTIME
TOPICAL | Status: DC | PRN
  Administered 2013-09-11: 1000 mL

## 2013-09-11 MED ORDER — FENTANYL CITRATE 0.05 MG/ML IJ SOLN
50.0000 ug | Freq: Once | INTRAMUSCULAR | Status: AC
  Administered 2013-09-11: 50 ug via INTRAVENOUS

## 2013-09-11 MED ORDER — CEFAZOLIN SODIUM-DEXTROSE 2-3 GM-% IV SOLR
INTRAVENOUS | Status: DC | PRN
  Administered 2013-09-11: 2 g via INTRAVENOUS

## 2013-09-11 MED ORDER — FENTANYL CITRATE 0.05 MG/ML IJ SOLN
INTRAMUSCULAR | Status: AC
  Administered 2013-09-11: 50 ug via INTRAVENOUS
  Filled 2013-09-11: qty 2

## 2013-09-11 MED ORDER — ONDANSETRON HCL 4 MG/2ML IJ SOLN
INTRAMUSCULAR | Status: AC
  Administered 2013-09-11: 4 mg via INTRAMUSCULAR
  Filled 2013-09-11: qty 2

## 2013-09-11 MED ORDER — MORPHINE SULFATE 2 MG/ML IJ SOLN
1.0000 mg | INTRAMUSCULAR | Status: DC | PRN
  Administered 2013-09-12: 4 mg via INTRAVENOUS
  Administered 2013-09-12 (×2): 2 mg via INTRAVENOUS
  Filled 2013-09-11 (×2): qty 1
  Filled 2013-09-11: qty 2

## 2013-09-11 MED ORDER — ONDANSETRON HCL 4 MG/2ML IJ SOLN
4.0000 mg | Freq: Once | INTRAMUSCULAR | Status: AC
  Administered 2013-09-11: 4 mg via INTRAMUSCULAR

## 2013-09-11 SURGICAL SUPPLY — 43 items
APL SKNCLS STERI-STRIP NONHPOA (GAUZE/BANDAGES/DRESSINGS) ×1
BENZOIN TINCTURE PRP APPL 2/3 (GAUZE/BANDAGES/DRESSINGS) ×3 IMPLANT
BLADE 10 SAFETY STRL DISP (BLADE) ×3 IMPLANT
BLADE 15 SAFETY STRL DISP (BLADE) ×3 IMPLANT
BLADE SURG ROTATE 9660 (MISCELLANEOUS) IMPLANT
CHLORAPREP W/TINT 26ML (MISCELLANEOUS) ×3 IMPLANT
CLOSURE WOUND 1/2 X4 (GAUZE/BANDAGES/DRESSINGS) ×1
COVER SURGICAL LIGHT HANDLE (MISCELLANEOUS) ×3 IMPLANT
DRAIN PENROSE 1/2X12 LTX STRL (WOUND CARE) IMPLANT
DRAPE LAPAROTOMY TRNSV 102X78 (DRAPE) ×3 IMPLANT
DRAPE UTILITY 15X26 W/TAPE STR (DRAPE) ×6 IMPLANT
DRESSING TELFA 8X10 (GAUZE/BANDAGES/DRESSINGS) ×2 IMPLANT
DRESSING TELFA 8X3 (GAUZE/BANDAGES/DRESSINGS) ×3 IMPLANT
DRSG TEGADERM 4X4.75 (GAUZE/BANDAGES/DRESSINGS) ×3 IMPLANT
ELECT CAUTERY BLADE 6.4 (BLADE) ×3 IMPLANT
ELECT REM PT RETURN 9FT ADLT (ELECTROSURGICAL) ×3
ELECTRODE REM PT RTRN 9FT ADLT (ELECTROSURGICAL) ×1 IMPLANT
GLOVE SURG SIGNA 7.5 PF LTX (GLOVE) ×3 IMPLANT
GOWN STRL REUS W/ TWL LRG LVL3 (GOWN DISPOSABLE) ×1 IMPLANT
GOWN STRL REUS W/ TWL XL LVL3 (GOWN DISPOSABLE) ×1 IMPLANT
GOWN STRL REUS W/TWL LRG LVL3 (GOWN DISPOSABLE) ×3
GOWN STRL REUS W/TWL XL LVL3 (GOWN DISPOSABLE) ×3
KIT BASIN OR (CUSTOM PROCEDURE TRAY) ×3 IMPLANT
KIT ROOM TURNOVER OR (KITS) ×3 IMPLANT
MESH PARIETEX PROGRIP LEFT (Mesh General) ×2 IMPLANT
NDL HYPO 25GX1X1/2 BEV (NEEDLE) ×1 IMPLANT
NEEDLE HYPO 25GX1X1/2 BEV (NEEDLE) ×3 IMPLANT
NS IRRIG 1000ML POUR BTL (IV SOLUTION) ×3 IMPLANT
PACK SURGICAL SETUP 50X90 (CUSTOM PROCEDURE TRAY) ×3 IMPLANT
PAD ARMBOARD 7.5X6 YLW CONV (MISCELLANEOUS) ×6 IMPLANT
PENCIL BUTTON HOLSTER BLD 10FT (ELECTRODE) ×3 IMPLANT
SPECIMEN JAR SMALL (MISCELLANEOUS) IMPLANT
SPONGE LAP 18X18 X RAY DECT (DISPOSABLE) ×1 IMPLANT
STRIP CLOSURE SKIN 1/2X4 (GAUZE/BANDAGES/DRESSINGS) ×2 IMPLANT
SUT MON AB 4-0 PC3 18 (SUTURE) ×3 IMPLANT
SUT SILK 2 0 SH (SUTURE) IMPLANT
SUT VIC AB 2-0 CT1 27 (SUTURE) ×6
SUT VIC AB 2-0 CT1 TAPERPNT 27 (SUTURE) ×2 IMPLANT
SUT VIC AB 3-0 CT1 27 (SUTURE) ×3
SUT VIC AB 3-0 CT1 TAPERPNT 27 (SUTURE) ×1 IMPLANT
SYR CONTROL 10ML LL (SYRINGE) ×3 IMPLANT
TOWEL OR 17X24 6PK STRL BLUE (TOWEL DISPOSABLE) ×3 IMPLANT
TOWEL OR 17X26 10 PK STRL BLUE (TOWEL DISPOSABLE) ×3 IMPLANT

## 2013-09-11 NOTE — Anesthesia Postprocedure Evaluation (Signed)
  Anesthesia Post-op Note  Patient: Terri Harris  Procedure(s) Performed: Procedure(s): HERNIA REPAIR INGUINAL INCARCERATED (Left)  Patient Location: PACU  Anesthesia Type:General  Level of Consciousness: awake and sedated  Airway and Oxygen Therapy: Patient Spontanous Breathing  Post-op Pain: mild  Post-op Assessment: Post-op Vital signs reviewed  Post-op Vital Signs: stable  Last Vitals:  Filed Vitals:   09/11/13 2245  BP: 158/74  Pulse: 74  Temp:   Resp: 25    Complications: No apparent anesthesia complications

## 2013-09-11 NOTE — Anesthesia Preprocedure Evaluation (Addendum)
Anesthesia Evaluation  Patient identified by MRN, date of birth, ID band Patient awake    Reviewed: Allergy & Precautions, H&P , NPO status , Patient's Chart, lab work & pertinent test results  Airway Mallampati: I TM Distance: >3 FB Neck ROM: Full    Dental  (+) Teeth Intact, Partial Lower   Pulmonary COPDformer smoker,  breath sounds clear to auscultation        Cardiovascular Rhythm:Regular Rate:Normal     Neuro/Psych    GI/Hepatic Neg liver ROS, GERD-  Medicated and Controlled,  Endo/Other  negative endocrine ROS  Renal/GU negative Renal ROS     Musculoskeletal   Abdominal   Peds  Hematology   Anesthesia Other Findings   Reproductive/Obstetrics                          Anesthesia Physical Anesthesia Plan  ASA: III and emergent  Anesthesia Plan: General   Post-op Pain Management:    Induction: Intravenous and Rapid sequence  Airway Management Planned: Oral ETT  Additional Equipment:   Intra-op Plan:   Post-operative Plan: Extubation in OR  Informed Consent: I have reviewed the patients History and Physical, chart, labs and discussed the procedure including the risks, benefits and alternatives for the proposed anesthesia with the patient or authorized representative who has indicated his/her understanding and acceptance.   Dental advisory given  Plan Discussed with: Anesthesiologist and Surgeon  Anesthesia Plan Comments:         Anesthesia Quick Evaluation

## 2013-09-11 NOTE — Progress Notes (Signed)
Report to Dessa Phi RN as primary caregiver

## 2013-09-11 NOTE — ED Provider Notes (Signed)
CSN: [id number]     Arrival date & time 09/11/13  1643 History   First MD Initiated Contact with Patient 09/11/13 1648     Chief Complaint  Patient presents with  . Abdominal Pain     (Consider location/radiation/quality/duration/timing/severity/associated sxs/prior Treatment) HPI Patient reports on January 24 she had a small bowel obstruction and had surgery at [facility] performed by Dr. Ninfa Linden. She reports she had a left inguinal hernia with fat tissue that he reduced in the OR and closed with a couple sutures. He did not want to try to do definitive treatment of the hernia at that time. Yesterday she "didn't feel up to par". When asked what that means she states she just felt lack of energy. She states today she started noticing the left inguinal hernia started reappearing and got progressively bigger during the day. She then started having diffuse abdominal pain and had nausea with vomiting. She states she had a small bowel movement however she has a pressure like feeling like she needs to have a BM or urinate. However she states she is urinating fine. She denies any fever. She states she went to her PCP office however she was in such pain she couldn't stand it and she came to the ED.  PCP Dr Wolfgang [place] Surgeon  Dr Ninfa Linden CCS  Past Medical History  Diagnosis Date  . Hypercholesteremia   . GERD (gastroesophageal reflux disease)   . Arthritis   . Osteoporosis   . ANA positive 01/2001  . Cancer     partial hysterectomy age 84-uterine cancer  . SBO (small bowel obstruction) 06/20/2103  . Ileus, postoperative 07/02/2013  . Hx of tobacco use, presenting hazards to health 07/02/2013  . Arthritis 07/02/2013    ANA positive   Past Surgical History  Procedure Laterality Date  . Abdominal hysterectomy    . Colonoscopy  10/25/2011    Procedure: COLONOSCOPY;  Surgeon: Rogene Houston, MD;  [facility]: [facility];  Service: Endoscopy;  Laterality: N/A;  930  . Tonsillectomy    .  Esophagogastroduodenoscopy  02/2009  . Laparotomy N/A 06/20/2013    Procedure: EXPLORATORY LAPAROTOMY;  Surgeon: Harl Bowie, MD;  Location: [facility];  Service: General;  Laterality: N/A;  . Bowel resection N/A 06/20/2013    Procedure: SMALL BOWEL RESECTION;  Surgeon: Harl Bowie, MD;  Location: [facility];  Service: General;  Laterality: N/A;   Family History  Problem Relation Age of Onset  . Anesthesia problems Neg Hx   . Hypotension Neg Hx   . Malignant hyperthermia Neg Hx   . Pseudochol deficiency Neg Hx   . Hypertension Mother   . Heart disease Father   . Diabetes Sister   . Heart disease Sister   . Diabetes Brother   . Heart disease Brother    History  Substance Use Topics  . Smoking status: Former Smoker -- 0.25 packs/day for 20 years    Types: Cigarettes    Quit date: 10/18/2011  . Smokeless tobacco: Not on file  . Alcohol Use: No  lives at home Lives with spouse   OB History   Grav Para Term Preterm Abortions TAB SAB Ect Mult Living                 Review of Systems  All other systems reviewed and are negative.     Allergies  Codeine  Home Medications   Prior to Admission medications   Medication Sig Start Date End Date Taking? Authorizing  Provider  acetaminophen (TYLENOL) 500 MG tablet Take 1,000 mg by mouth every 6 (six) hours as needed. Pain   Yes Historical Provider, MD  polyethylene glycol (MIRALAX / GLYCOLAX) packet Use daily as needed, you should have 1-2 smooth bowel movements per day. 07/01/13  Yes Earnstine Regal, PA-C   BP 173/111  Pulse 75  Temp(Src) 97.9 F (36.6 C) (Oral)  Resp 20  Ht 5\' 6"  (1.676 m)  Wt 128 lb (58.06 kg)  BMI 20.67 kg/m2  SpO2 97%  Vital signs normal except for hypertension  Physical Exam  Nursing note and vitals reviewed. Constitutional: She is oriented to person, place, and time. She appears well-developed and well-nourished.  Non-toxic appearance. She does not appear ill. She appears distressed.  HENT:   Head: Normocephalic and atraumatic.  Right Ear: External ear normal.  Left Ear: External ear normal.  Nose: Nose normal. No mucosal edema or rhinorrhea.  Mouth/Throat: Oropharynx is clear and moist and mucous membranes are normal. No dental abscesses or uvula swelling.  Eyes: Conjunctivae and EOM are normal. Pupils are equal, round, and reactive to light.  Neck: Normal range of motion and full passive range of motion without pain. Neck supple.  Cardiovascular: Normal rate, regular rhythm and normal heart sounds.  Exam reveals no gallop and no friction rub.   No murmur heard. Pulmonary/Chest: Effort normal and breath sounds normal. No respiratory distress. She has no wheezes. She has no rhonchi. She has no rales. She exhibits no tenderness and no crepitus.  Abdominal: Soft. Normal appearance and bowel sounds are normal. She exhibits distension. There is no tenderness. There is no rebound and no guarding.  Abdomen distended. She has a hard mass in her left inguinal area. It's about the size of a small apple. She has tinkling bowel sounds diffusely. Her abdomen is tender diffusely.  Musculoskeletal: Normal range of motion. She exhibits no edema and no tenderness.  Moves all extremities well.   Neurological: She is alert and oriented to person, place, and time. She has normal strength. No cranial nerve deficit.  Skin: Skin is warm, dry and intact. No rash noted. No erythema. No pallor.  Psychiatric: Her speech is normal and behavior is normal. Her mood appears not anxious.  Flat affect    ED Course  Procedures (including critical care time)  Medications  0.9 %  sodium chloride infusion (not administered)  HYDROmorphone (DILAUDID) injection 1 mg (not administered)  ondansetron (ZOFRAN) injection 4 mg (4 mg Intramuscular Given 09/11/13 1711)  fentaNYL (SUBLIMAZE) injection 50 mcg (50 mcg Intravenous Given 09/11/13 1711)  HYDROmorphone (DILAUDID) injection 1 mg (1 mg Intravenous Given 09/11/13  1726)  ondansetron (ZOFRAN) injection 4 mg (4 mg Intravenous Given 09/11/13 1840)  sodium chloride 0.9 % bolus 700 mL (0 mLs Intravenous Stopped 09/11/13 1813)  iohexol (OMNIPAQUE) 300 MG/ML solution 100 mL (100 mLs Intravenous Contrast Given 09/11/13 1826)    18:00After adequate pain control with dilaudid, the patient was placed in Trendelenburg position. I applied constant firm pressure to her left inguinal hernia for at least 3 minutes without change. Patient states that if she needs to be admitted she prefers to go to [facility] where she can see Dr. Ninfa Linden.  NG tube inserted after reviewing her xrays.   Patient understands the need for NG tube after reviewing her x-ray. CT scan ordered without oral contrast  since she will have an NG tube placed.  18:29 Dr Harlow Asa, states he is on call for [facility], Dr Ninfa Linden is  on call for [facility]. Will have [facility] call him  18:52 Dr Ninfa Linden, send to [facility] ED and he will see there.   18:59 Dr Rogene Houston, Pod E at [facility] notified of transfer  19:02 Charge nurse at [facility] notified of transfer, he will put patient in [place] E  19:40 I attempted to reduce the hernia again without success, pt had just gotten dilaudid again.    Labs Review Results for orders placed during the hospital encounter of 09/11/13  CBC WITH DIFFERENTIAL      Result Value Ref Range   WBC 11.7 (*) 4.0 - 10.5 K/uL   RBC 4.93  3.87 - 5.11 MIL/uL   Hemoglobin 15.4 (*) 12.0 - 15.0 g/dL   HCT 46.5 (*) 36.0 - 46.0 %   MCV 94.3  78.0 - 100.0 fL   MCH 31.2  26.0 - 34.0 pg   MCHC 33.1  30.0 - 36.0 g/dL   RDW 13.4  11.5 - 15.5 %   Platelets 282  150 - 400 K/uL   Neutrophils Relative % 79 (*) 43 - 77 %   Neutro Abs 9.2 (*) 1.7 - 7.7 K/uL   Lymphocytes Relative 13  12 - 46 %   Lymphs Abs 1.6  0.7 - 4.0 K/uL   Monocytes Relative 7  3 - 12 %   Monocytes Absolute 0.8  0.1 - 1.0 K/uL   Eosinophils Relative 1  0 - 5 %   Eosinophils Absolute 0.1  0.0 - 0.7 K/uL   Basophils Relative 0  0 - 1 %   Basophils  Absolute 0.1  0.0 - 0.1 K/uL  COMPREHENSIVE METABOLIC PANEL      Result Value Ref Range   Sodium 142  137 - 147 mEq/L   Potassium 3.8  3.7 - 5.3 mEq/L   Chloride 100  96 - 112 mEq/L   CO2 26  19 - 32 mEq/L   Glucose, Bld 107 (*) 70 - 99 mg/dL   BUN 12  6 - 23 mg/dL   Creatinine, Ser 0.63  0.50 - 1.10 mg/dL   Calcium 10.1  8.4 - 10.5 mg/dL   Total Protein 9.1 (*) 6.0 - 8.3 g/dL   Albumin 4.6  3.5 - 5.2 g/dL   AST 19  0 - 37 U/L   ALT 9  0 - 35 U/L   Alkaline Phosphatase 92  39 - 117 U/L   Total Bilirubin 0.2 (*) 0.3 - 1.2 mg/dL   GFR calc non Af Amer 88 (*) >90 mL/min   GFR calc Af Amer >90  >90 mL/min  URINALYSIS, ROUTINE W REFLEX MICROSCOPIC      Result Value Ref Range   Color, Urine YELLOW  YELLOW   APPearance CLEAR  CLEAR   Specific Gravity, Urine >1.030 (*) 1.005 - 1.030   pH 5.5  5.0 - 8.0   Glucose, UA NEGATIVE  NEGATIVE mg/dL   Hgb urine dipstick TRACE (*) NEGATIVE   Bilirubin Urine NEGATIVE  NEGATIVE   Ketones, ur TRACE (*) NEGATIVE mg/dL   Protein, ur 30 (*) NEGATIVE mg/dL   Urobilinogen, UA 0.2  0.0 - 1.0 mg/dL   Nitrite NEGATIVE  NEGATIVE   Leukocytes, UA NEGATIVE  NEGATIVE  URINE MICROSCOPIC-ADD ON      Result Value Ref Range   Squamous Epithelial / LPF MANY (*) RARE   WBC, UA 0-2  <3 WBC/hpf   RBC / HPF 3-6  <3 RBC/hpf   Bacteria, UA MANY (*) RARE   Urine-Other  MUCOUS PRESENT     Laboratory interpretation all normal except for leukocytosis, mild anemia  Imaging Review Dg Abd Acute W/chest  09/11/2013   CLINICAL DATA:  Abdominal pain, nausea and vomiting. Left inguinal hernia.  EXAM: ACUTE ABDOMEN SERIES (ABDOMEN 2 VIEW & CHEST 1 VIEW)  COMPARISON:  DG ABD 2 VIEWS dated 06/23/2013; DG ABD 1 VIEW dated 06/19/2013; CT ABD - PELV W/ CM dated 05/31/2013; DG ABD ACUTE W/CHEST dated 05/31/2013; CT ABD/PELVIS W CM dated 12/10/2010  FINDINGS: There is no evidence of pulmonary edema, consolidation, pneumothorax, nodule or pleural fluid. The heart size is normal.   Significantly dilated central bowel loops likely represent small bowel and contain air-fluid levels. There is evidence of prior bowel resection. Small bowel may measure as large as 8 cm in diameter. No evidence of free air.  IMPRESSION: Evidence of small bowel obstruction with central dilated small bowel loops measuring nearly 8 cm in greatest diameter. No free air is identified.   Electronically Signed   By: Aletta Edouard M.D.   On: 09/11/2013 18:02   Ct Abdomen Pelvis W Contrast  09/11/2013   CLINICAL DATA:  Abdominal pain and small bowel obstruction by x-ray.  EXAM: CT ABDOMEN AND PELVIS WITH CONTRAST  TECHNIQUE: Multidetector CT imaging of the abdomen and pelvis was performed using the standard protocol following bolus administration of intravenous contrast.  CONTRAST:  164mL OMNIPAQUE IOHEXOL 300 MG/ML  SOLN  COMPARISON:  DG ABD ACUTE W/CHEST dated 09/11/2013; CT ABD - PELV W/ CM dated 05/31/2013  FINDINGS: There is evidence of a high-grade small bowel obstruction with multiple dilated small bowel loops identified. The largest measured caliber of small bowel is approximately 5.6 cm. The transition point is at the level of a likely incarcerated left inguinal hernia. There is no evidence of free intraperitoneal air, significant free fluid or focal abscess. The colon is decompressed.  The liver, gallbladder, pancreas, spleen, adrenal glands and kidneys are unremarkable. Atherosclerotic disease of the abdominal aorta present without evidence of aneurysm. Bony structures show significant degenerative changes of the lumbar spine. Visualized lung bases shows emphysematous changes and scarring at the left anterior lung base.  IMPRESSION: High-grade small bowel obstruction due to incarcerated left inguinal hernia.  Findings conveyed to Dr. Roderic Palau on 09/11/2013 at 19:02.   Electronically Signed   By: Aletta Edouard M.D.   On: 09/11/2013 19:03         EKG Interpretation None      MDM   Final diagnoses:   SBO (small bowel obstruction)  Inguinal hernia with incarceration    Plan transfer to [place], [place], [place] Performed by: Raizel Wesolowski L Harvy Riera Total critical care time: 32 min Critical care time was exclusive of separately billable procedures and treating other patients. Critical care was necessary to treat or prevent imminent or life-threatening deterioration. Critical care was time spent personally by me on the following activities: development of treatment plan with patient and/or surrogate as well as nursing, discussions with consultants, evaluation of patient's response to treatment, examination of patient, obtaining history from patient or surrogate, ordering and performing treatments and interventions, ordering and review of laboratory studies, ordering and review of radiographic studies, pulse oximetry and re-evaluation of patient's condition.   Janice Norrie, MD 09/11/13 2143

## 2013-09-11 NOTE — Transfer of Care (Signed)
Immediate Anesthesia Transfer of Care Note  Patient: Terri Harris  Procedure(s) Performed: Procedure(s): HERNIA REPAIR INGUINAL INCARCERATED (Left)  Patient Location: PACU  Anesthesia Type:General  Level of Consciousness: awake, alert  and oriented  Airway & Oxygen Therapy: Patient Spontanous Breathing and Patient connected to nasal cannula oxygen  Post-op Assessment: Report given to PACU RN and Post -op Vital signs reviewed and stable  Post vital signs: Reviewed and stable  Complications: No apparent anesthesia complications

## 2013-09-11 NOTE — Op Note (Signed)
HERNIA REPAIR INGUINAL INCARCERATED  Procedure Note  Terri Harris 09/11/2013   Pre-op Diagnosis: Incarcerated Left Inguinal Hernia     Post-op Diagnosis: Incarcerated Left Femoral Hernia  Procedure(s): REPAIR INCARCERATED LEFT FEMORAL HERNIA WITH MESH  Surgeon(s): Harl Bowie, MD  Anesthesia: General  Staff:  Circulator: Natalia Leatherwood, RN Scrub Person: Megan Day Cavanaugh, RN  Estimated Blood Loss: Minimal                         Harl Bowie   Date: 09/11/2013  Time: 10:23 PM

## 2013-09-11 NOTE — ED Notes (Signed)
Family at bedside. 

## 2013-09-11 NOTE — ED Notes (Signed)
Generalized abd pain with n/v starting today.  Recent SBO surgery.

## 2013-09-11 NOTE — ED Notes (Signed)
Pt thrashing in bed, moaning, actively vomiting, c/o severe abd pain.  meds given per protocol.

## 2013-09-11 NOTE — ED Notes (Signed)
Family at bedside. Patient states that she is not hurting Nurse gave pain medication

## 2013-09-11 NOTE — H&P (Signed)
Terri Harris is an 72 y.o. female.   Chief Complaint: abd pain, nausea, and vomiting  HPI: pt with exp lap for sbo earlier this year.  Had small bowel resection.  Had known left inguinal hernia that was primarily repaired.  She recurred earlier today and developed a painful bulge in the LLQ with N/V.  Past Medical History  Diagnosis Date  . Hypercholesteremia   . GERD (gastroesophageal reflux disease)   . Arthritis   . Osteoporosis   . ANA positive 01/2001  . Cancer     partial hysterectomy age 30-uterine cancer  . SBO (small bowel obstruction) 06/20/2103  . Ileus, postoperative 07/02/2013  . Hx of tobacco use, presenting hazards to health 07/02/2013  . Arthritis 07/02/2013    ANA positive    Past Surgical History  Procedure Laterality Date  . Abdominal hysterectomy    . Colonoscopy  10/25/2011    Procedure: COLONOSCOPY;  Surgeon: Najeeb U Rehman, MD;  [facility];  Service: Endoscopy;  Laterality: N/A;  930  . Tonsillectomy    . Esophagogastroduodenoscopy  02/2009  . Laparotomy N/A 06/20/2013    Procedure: EXPLORATORY LAPAROTOMY;  Surgeon: Douglas A Blackman, MD;  Location: [facility] OR;  Service: General;  Laterality: N/A;  . Bowel resection N/A 06/20/2013    Procedure: SMALL BOWEL RESECTION;  Surgeon: Douglas A Blackman, MD;  Location: [facility] OR;  Service: General;  Laterality: N/A;    Family History  Problem Relation Age of Onset  . Anesthesia problems Neg Hx   . Hypotension Neg Hx   . Malignant hyperthermia Neg Hx   . Pseudochol deficiency Neg Hx   . Hypertension Mother   . Heart disease Father   . Diabetes Sister   . Heart disease Sister   . Diabetes Brother   . Heart disease Brother    Social History:  reports that she quit smoking about 22 months ago. Her smoking use included Cigarettes. She has a 5 pack-year smoking history. She does not have any smokeless tobacco history on file. She reports that she does not drink alcohol or use illicit drugs.  Allergies:   Allergies  Allergen Reactions  . Codeine Nausea And Vomiting    Medications Prior to Admission  Medication Sig Dispense Refill  . acetaminophen (TYLENOL) 500 MG tablet Take 1,000 mg by mouth every 6 (six) hours as needed. Pain      . polyethylene glycol (MIRALAX / GLYCOLAX) packet Use daily as needed, you should have 1-2 smooth bowel movements per day.  14 each  0    Results for orders placed during the hospital encounter of 09/11/13 (from the past 48 hour(s))  CBC WITH DIFFERENTIAL     Status: Abnormal   Collection Time    09/11/13  5:24 PM      Result Value Ref Range   WBC 11.7 (*) 4.0 - 10.5 K/uL   RBC 4.93  3.87 - 5.11 MIL/uL   Hemoglobin 15.4 (*) 12.0 - 15.0 g/dL   HCT 46.5 (*) 36.0 - 46.0 %   MCV 94.3  78.0 - 100.0 fL   MCH 31.2  26.0 - 34.0 pg   MCHC 33.1  30.0 - 36.0 g/dL   RDW 13.4  11.5 - 15.5 %   Platelets 282  150 - 400 K/uL   Neutrophils Relative % 79 (*) 43 - 77 %   Neutro Abs 9.2 (*) 1.7 - 7.7 K/uL   Lymphocytes Relative 13  12 - 46 %     Lymphs Abs 1.6  0.7 - 4.0 K/uL   Monocytes Relative 7  3 - 12 %   Monocytes Absolute 0.8  0.1 - 1.0 K/uL   Eosinophils Relative 1  0 - 5 %   Eosinophils Absolute 0.1  0.0 - 0.7 K/uL   Basophils Relative 0  0 - 1 %   Basophils Absolute 0.1  0.0 - 0.1 K/uL  COMPREHENSIVE METABOLIC PANEL     Status: Abnormal   Collection Time    09/11/13  5:24 PM      Result Value Ref Range   Sodium 142  137 - 147 mEq/L   Potassium 3.8  3.7 - 5.3 mEq/L   Chloride 100  96 - 112 mEq/L   CO2 26  19 - 32 mEq/L   Glucose, Bld 107 (*) 70 - 99 mg/dL   BUN 12  6 - 23 mg/dL   Creatinine, Ser 0.63  0.50 - 1.10 mg/dL   Calcium 10.1  8.4 - 10.5 mg/dL   Total Protein 9.1 (*) 6.0 - 8.3 g/dL   Albumin 4.6  3.5 - 5.2 g/dL   AST 19  0 - 37 U/L   ALT 9  0 - 35 U/L   Alkaline Phosphatase 92  39 - 117 U/L   Total Bilirubin 0.2 (*) 0.3 - 1.2 mg/dL   GFR calc non Af Amer 88 (*) >90 mL/min   GFR calc Af Amer >90  >90 mL/min   Comment: (NOTE)     The eGFR  has been calculated using the CKD EPI equation.     This calculation has not been validated in all clinical situations.     eGFR's persistently <90 mL/min signify possible Chronic Kidney     Disease.  URINALYSIS, ROUTINE W REFLEX MICROSCOPIC     Status: Abnormal   Collection Time    09/11/13  6:15 PM      Result Value Ref Range   Color, Urine YELLOW  YELLOW   APPearance CLEAR  CLEAR   Specific Gravity, Urine >1.030 (*) 1.005 - 1.030   pH 5.5  5.0 - 8.0   Glucose, UA NEGATIVE  NEGATIVE mg/dL   Hgb urine dipstick TRACE (*) NEGATIVE   Bilirubin Urine NEGATIVE  NEGATIVE   Ketones, ur TRACE (*) NEGATIVE mg/dL   Protein, ur 30 (*) NEGATIVE mg/dL   Urobilinogen, UA 0.2  0.0 - 1.0 mg/dL   Nitrite NEGATIVE  NEGATIVE   Leukocytes, UA NEGATIVE  NEGATIVE  URINE MICROSCOPIC-ADD ON     Status: Abnormal   Collection Time    09/11/13  6:15 PM      Result Value Ref Range   Squamous Epithelial / LPF MANY (*) RARE   WBC, UA 0-2  <3 WBC/hpf   RBC / HPF 3-6  <3 RBC/hpf   Bacteria, UA MANY (*) RARE   Urine-Other MUCOUS PRESENT     Ct Abdomen Pelvis W Contrast  09/11/2013   CLINICAL DATA:  Abdominal pain and small bowel obstruction by x-ray.  EXAM: CT ABDOMEN AND PELVIS WITH CONTRAST  TECHNIQUE: Multidetector CT imaging of the abdomen and pelvis was performed using the standard protocol following bolus administration of intravenous contrast.  CONTRAST:  100mL OMNIPAQUE IOHEXOL 300 MG/ML  SOLN  COMPARISON:  DG ABD ACUTE W/CHEST dated 09/11/2013; CT ABD - PELV W/ CM dated 05/31/2013  FINDINGS: There is evidence of a high-grade small bowel obstruction with multiple dilated small bowel loops identified. The largest measured caliber of small bowel is   approximately 5.6 cm. The transition point is at the level of a likely incarcerated left inguinal hernia. There is no evidence of free intraperitoneal air, significant free fluid or focal abscess. The colon is decompressed.  The liver, gallbladder, pancreas, spleen,  adrenal glands and kidneys are unremarkable. Atherosclerotic disease of the abdominal aorta present without evidence of aneurysm. Bony structures show significant degenerative changes of the lumbar spine. Visualized lung bases shows emphysematous changes and scarring at the left anterior lung base.  IMPRESSION: High-grade small bowel obstruction due to incarcerated left inguinal hernia.  Findings conveyed to Dr. Zammit on 09/11/2013 at 19:02.   Electronically Signed   By: Glenn  Yamagata M.D.   On: 09/11/2013 19:03   Dg Abd Acute W/chest  09/11/2013   CLINICAL DATA:  Abdominal pain, nausea and vomiting. Left inguinal hernia.  EXAM: ACUTE ABDOMEN SERIES (ABDOMEN 2 VIEW & CHEST 1 VIEW)  COMPARISON:  DG ABD 2 VIEWS dated 06/23/2013; DG ABD 1 VIEW dated 06/19/2013; CT ABD - PELV W/ CM dated 05/31/2013; DG ABD ACUTE W/CHEST dated 05/31/2013; CT ABD/PELVIS W CM dated 12/10/2010  FINDINGS: There is no evidence of pulmonary edema, consolidation, pneumothorax, nodule or pleural fluid. The heart size is normal.  Significantly dilated central bowel loops likely represent small bowel and contain air-fluid levels. There is evidence of prior bowel resection. Small bowel may measure as large as 8 cm in diameter. No evidence of free air.  IMPRESSION: Evidence of small bowel obstruction with central dilated small bowel loops measuring nearly 8 cm in greatest diameter. No free air is identified.   Electronically Signed   By: Glenn  Yamagata M.D.   On: 09/11/2013 18:02    Review of Systems  All other systems reviewed and are negative.   Blood pressure 142/78, pulse 70, temperature 97.9 F (36.6 C), temperature source Oral, resp. rate 18, height 5' 6" (1.676 m), weight 128 lb (58.06 kg), SpO2 97.00%. Physical Exam  Constitutional: She is oriented to person, place, and time. She appears well-developed and well-nourished. No distress.  Eyes: Conjunctivae are normal. Pupils are equal, round, and reactive to light.  Neck: Neck  supple. No tracheal deviation present.  Cardiovascular: Normal rate, regular rhythm and normal heart sounds.   No murmur heard. Respiratory: No respiratory distress. She has no wheezes.  GI: Soft. She exhibits distension. There is tenderness.  Incarcerated, tender left inguinal hernia  Musculoskeletal: Normal range of motion. She exhibits no edema and no tenderness.  Lymphadenopathy:    She has no cervical adenopathy.  Neurological: She is alert and oriented to person, place, and time.  Skin: Skin is warm and dry. No rash noted. No erythema.  Psychiatric: Her behavior is normal. Judgment normal.     Assessment/Plan Incarcerated left inguinal hernia  Plan emergent repair with mesh.  I discussed the risks with the patient as well as her husband by phone.  The risks include but are not limited to bleeding, infection, injury to surrounding structures, need for bowel resection, use of mesh, etc.  They agree to proceed.  Douglas A Blackman 09/11/2013, 9:24 PM    

## 2013-09-12 LAB — BASIC METABOLIC PANEL
BUN: 10 mg/dL (ref 6–23)
CO2: 23 mEq/L (ref 19–32)
Calcium: 8.5 mg/dL (ref 8.4–10.5)
Chloride: 106 mEq/L (ref 96–112)
Creatinine, Ser: 0.6 mg/dL (ref 0.50–1.10)
GFR calc Af Amer: >90 mL/min (ref >90)
GFR calc non Af Amer: 90 mL/min — ABNORMAL LOW (ref >90)
Glucose, Bld: 94 mg/dL (ref 70–99)
Potassium: 3.9 mEq/L (ref 3.7–5.3)
Sodium: 140 mEq/L (ref 137–147)

## 2013-09-12 LAB — CBC
HCT: 39.5 % (ref 36.0–46.0)
Hemoglobin: 12.8 g/dL (ref 12.0–15.0)
MCH: 31.4 pg (ref 26.0–34.0)
MCHC: 32.4 g/dL (ref 30.0–36.0)
MCV: 97.1 fL (ref 78.0–100.0)
Platelets: 209 10*3/uL (ref 150–400)
RBC: 4.07 MIL/uL (ref 3.87–5.11)
RDW: 13.7 % (ref 11.5–15.5)
WBC: 8 10*3/uL (ref 4.0–10.5)

## 2013-09-12 MED ORDER — BOOST / RESOURCE BREEZE PO LIQD
1.0000 | Freq: Two times a day (BID) | ORAL | Status: DC
  Administered 2013-09-12 – 2013-09-14 (×5): 1 via ORAL

## 2013-09-12 MED ORDER — OXYCODONE-ACETAMINOPHEN 5-325 MG PO TABS
1.0000 | ORAL_TABLET | ORAL | Status: DC | PRN
  Administered 2013-09-12 – 2013-09-14 (×8): 2 via ORAL
  Filled 2013-09-12 (×8): qty 2

## 2013-09-12 NOTE — Progress Notes (Signed)
NG tube is out Patient tolerating well without nausea Clears  Imogene Burn. Georgette Dover, MD, [facility] Surgery  General/ Trauma Surgery  09/12/2013 4:28 PM

## 2013-09-12 NOTE — Progress Notes (Signed)
Patient ID: Terri Harris, female   DOB: 11-14-1940, 73 y.o.   MRN: [id number]   Subjective: No flatus yet.  576m out of NGT  Objective:  Vital signs:  Filed Vitals:   09/11/13 2323 09/11/13 2346 09/12/13 0149 09/12/13 0536  BP: 125/55 132/62 137/66 130/65  Pulse: 65 68 65 63  Temp: 98 F (36.7 C) 97.6 F (36.4 C) 98.1 F (36.7 C) 97.8 F (36.6 C)  TempSrc:  Oral Oral Oral  Resp: 11 16 16 17   Height:      Weight:      SpO2: 95% 100% 99% 100%    Last BM Date: 09/11/13  Intake/Output   Yesterday:  04/16 0701 - 04/17 0700 In: 1116.7 [I.V.:1116.7] Out: 1075 [Urine:525; Emesis/NG output:550] This shift:    I/O last 3 completed shifts: In: 1116.7 [I.V.:1116.7] Out: 1075 [Urine:525; Emesis/NG output:550]    Physical Exam: General: Pt awake/alert/oriented x4 in no acute distress Chest: cta.  No chest wall pain w good excursion CV:  Pulses intact.  Regular rhythm Abdomen: Soft.  Nondistended. Appropriately tender.  LLQ dressing is dry.  No evidence of peritonitis.  No incarcerated hernias. Ext:  SCDs BLE.  No mjr edema.  No cyanosis Skin: No petechiae / purpura   Problem List:   Active Problems:   Incarcerated femoral hernia    Results:   Labs: Results for orders placed during the hospital encounter of 09/11/13 (from the past 48 hour(s))  CBC WITH DIFFERENTIAL     Status: Abnormal   Collection Time    09/11/13  5:24 PM      Result Value Ref Range   WBC 11.7 (*) 4.0 - 10.5 K/uL   RBC 4.93  3.87 - 5.11 MIL/uL   Hemoglobin 15.4 (*) 12.0 - 15.0 g/dL   HCT 46.5 (*) 36.0 - 46.0 %   MCV 94.3  78.0 - 100.0 fL   MCH 31.2  26.0 - 34.0 pg   MCHC 33.1  30.0 - 36.0 g/dL   RDW 13.4  11.5 - 15.5 %   Platelets 282  150 - 400 K/uL   Neutrophils Relative % 79 (*) 43 - 77 %   Neutro Abs 9.2 (*) 1.7 - 7.7 K/uL   Lymphocytes Relative 13  12 - 46 %   Lymphs Abs 1.6  0.7 - 4.0 K/uL   Monocytes Relative 7  3 - 12 %   Monocytes Absolute 0.8  0.1 - 1.0 K/uL   Eosinophils  Relative 1  0 - 5 %   Eosinophils Absolute 0.1  0.0 - 0.7 K/uL   Basophils Relative 0  0 - 1 %   Basophils Absolute 0.1  0.0 - 0.1 K/uL  COMPREHENSIVE METABOLIC PANEL     Status: Abnormal   Collection Time    09/11/13  5:24 PM      Result Value Ref Range   Sodium 142  137 - 147 mEq/L   Potassium 3.8  3.7 - 5.3 mEq/L   Chloride 100  96 - 112 mEq/L   CO2 26  19 - 32 mEq/L   Glucose, Bld 107 (*) 70 - 99 mg/dL   BUN 12  6 - 23 mg/dL   Creatinine, Ser 0.63  0.50 - 1.10 mg/dL   Calcium 10.1  8.4 - 10.5 mg/dL   Total Protein 9.1 (*) 6.0 - 8.3 g/dL   Albumin 4.6  3.5 - 5.2 g/dL   AST 19  0 - 37 U/L   ALT 9  0 - 35 U/L   Alkaline Phosphatase 92  39 - 117 U/L   Total Bilirubin 0.2 (*) 0.3 - 1.2 mg/dL   GFR calc non Af Amer 88 (*) >90 mL/min   GFR calc Af Amer >90  >90 mL/min   Comment: (NOTE)     The eGFR has been calculated using the CKD EPI equation.     This calculation has not been validated in all clinical situations.     eGFR's persistently <90 mL/min signify possible Chronic Kidney     Disease.  URINALYSIS, ROUTINE W REFLEX MICROSCOPIC     Status: Abnormal   Collection Time    09/11/13  6:15 PM      Result Value Ref Range   Color, Urine YELLOW  YELLOW   APPearance CLEAR  CLEAR   Specific Gravity, Urine >1.030 (*) 1.005 - 1.030   pH 5.5  5.0 - 8.0   Glucose, UA NEGATIVE  NEGATIVE mg/dL   Hgb urine dipstick TRACE (*) NEGATIVE   Bilirubin Urine NEGATIVE  NEGATIVE   Ketones, ur TRACE (*) NEGATIVE mg/dL   Protein, ur 30 (*) NEGATIVE mg/dL   Urobilinogen, UA 0.2  0.0 - 1.0 mg/dL   Nitrite NEGATIVE  NEGATIVE   Leukocytes, UA NEGATIVE  NEGATIVE  URINE MICROSCOPIC-ADD ON     Status: Abnormal   Collection Time    09/11/13  6:15 PM      Result Value Ref Range   Squamous Epithelial / LPF MANY (*) RARE   WBC, UA 0-2  <3 WBC/hpf   RBC / HPF 3-6  <3 RBC/hpf   Bacteria, UA MANY (*) RARE   Urine-Other MUCOUS PRESENT    CBC     Status: None   Collection Time    09/12/13  6:26 AM       Result Value Ref Range   WBC 8.0  4.0 - 10.5 K/uL   RBC 4.07  3.87 - 5.11 MIL/uL   Hemoglobin 12.8  12.0 - 15.0 g/dL   HCT 39.5  36.0 - 46.0 %   MCV 97.1  78.0 - 100.0 fL   MCH 31.4  26.0 - 34.0 pg   MCHC 32.4  30.0 - 36.0 g/dL   RDW 13.7  11.5 - 15.5 %   Platelets 209  150 - 400 K/uL  BASIC METABOLIC PANEL     Status: Abnormal   Collection Time    09/12/13  6:26 AM      Result Value Ref Range   Sodium 140  137 - 147 mEq/L   Potassium 3.9  3.7 - 5.3 mEq/L   Chloride 106  96 - 112 mEq/L   CO2 23  19 - 32 mEq/L   Glucose, Bld 94  70 - 99 mg/dL   BUN 10  6 - 23 mg/dL   Creatinine, Ser 0.60  0.50 - 1.10 mg/dL   Calcium 8.5  8.4 - 10.5 mg/dL   GFR calc non Af Amer 90 (*) >90 mL/min   GFR calc Af Amer >90  >90 mL/min   Comment: (NOTE)     The eGFR has been calculated using the CKD EPI equation.     This calculation has not been validated in all clinical situations.     eGFR's persistently <90 mL/min signify possible Chronic Kidney     Disease.    Imaging / Studies: Ct Abdomen Pelvis W Contrast  09/11/2013   CLINICAL DATA:  Abdominal pain and small bowel obstruction by x-ray.  EXAM: CT ABDOMEN AND PELVIS WITH CONTRAST  TECHNIQUE: Multidetector CT imaging of the abdomen and pelvis was performed using the standard protocol following bolus administration of intravenous contrast.  CONTRAST:  195m OMNIPAQUE IOHEXOL 300 MG/ML  SOLN  COMPARISON:  DG ABD ACUTE W/CHEST dated 09/11/2013; CT ABD - PELV W/ CM dated 05/31/2013  FINDINGS: There is evidence of a high-grade small bowel obstruction with multiple dilated small bowel loops identified. The largest measured caliber of small bowel is approximately 5.6 cm. The transition point is at the level of a likely incarcerated left inguinal hernia. There is no evidence of free intraperitoneal air, significant free fluid or focal abscess. The colon is decompressed.  The liver, gallbladder, pancreas, spleen, adrenal glands and kidneys are unremarkable.  Atherosclerotic disease of the abdominal aorta present without evidence of aneurysm. Bony structures show significant degenerative changes of the lumbar spine. Visualized lung bases shows emphysematous changes and scarring at the left anterior lung base.  IMPRESSION: High-grade small bowel obstruction due to incarcerated left inguinal hernia.  Findings conveyed to Dr. ZRoderic Palauon 09/11/2013 at 19:02.   Electronically Signed   By: GAletta EdouardM.D.   On: 09/11/2013 19:03   Dg Abd Acute W/chest  09/11/2013   CLINICAL DATA:  Abdominal pain, nausea and vomiting. Left inguinal hernia.  EXAM: ACUTE ABDOMEN SERIES (ABDOMEN 2 VIEW & CHEST 1 VIEW)  COMPARISON:  DG ABD 2 VIEWS dated 06/23/2013; DG ABD 1 VIEW dated 06/19/2013; CT ABD - PELV W/ CM dated 05/31/2013; DG ABD ACUTE W/CHEST dated 05/31/2013; CT ABD/PELVIS W CM dated 12/10/2010  FINDINGS: There is no evidence of pulmonary edema, consolidation, pneumothorax, nodule or pleural fluid. The heart size is normal.  Significantly dilated central bowel loops likely represent small bowel and contain air-fluid levels. There is evidence of prior bowel resection. Small bowel may measure as large as 8 cm in diameter. No evidence of free air.  IMPRESSION: Evidence of small bowel obstruction with central dilated small bowel loops measuring nearly 8 cm in greatest diameter. No free air is identified.   Electronically Signed   By: GAletta EdouardM.D.   On: 09/11/2013 18:02    Medications / Allergies: per chart  Antibiotics: Anti-infectives   None      Assessment/Plan S/p repair of incarcerated left femoral hernia with mesh---Dr. BNinfa Linden4/16/15 -Clamp NGT and DC in 6 hours if no n/c -Start clears -po pain meds -mobilize -IS -SCDs, lovenox -IVF   EErby Pian [facility] Pager [phone] 09/12/2013  7:50 AM

## 2013-09-12 NOTE — Progress Notes (Signed)
INITIAL NUTRITION ASSESSMENT  DOCUMENTATION CODES Per approved criteria  -Not Applicable   INTERVENTION: Resource Breeze po BID, each supplement providing 250 kcal and 9 grams of protein   NUTRITION DIAGNOSIS: Inadequate oral intake related to inability to eat as evidenced by clear liquid diet status.   Goal: Meet >/=90% of estimated nutrition needs   Monitor:  PO intake, supplement acceptance, weight trends, labs   Reason for Assessment: Positive Malnutrition Screening Tool Score   73 y.o. female  Admitting Dx: Incarcerated Femoral Hernia   ASSESSMENT: 73 y.o. Female presenting with abd pain, nausea, and vomiting. Pt had surgery in January for a small bowel obstruction. Pt noticed the left inguinal hernia started reappearing and then started to have abdominal pain and nausea and vomiting.   Pt was very pleasant and feeling well today. Pt is eating and drinking all that she can. She explained that she had the green jello and two sprites today. She explained that she has lost 25 lbs since Thanksgiving because of her bowel issues, but since January she has gained 5 lbs.  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 Encouraged patient to continue low fat diet and avoid things to irritate the gut like nuts/seeds.   Nutrition Focused Physical Exam did not reveal any depletion of muscle mass or body fat. Pt is well nourished.   Labs WNL.    Height: Ht Readings from Last 1 Encounters:  09/11/13 5\' 6"  (1.676 m)    Weight: Wt Readings from Last 1 Encounters:  09/11/13 128 lb (58.06 kg)    Ideal Body Weight: 130 lbs   % Ideal Body Weight: 98%   Wt Readings from Last 10 Encounters:  09/11/13 128 lb (58.06 kg)  09/11/13 129 lb (58.514 kg)  09/11/13 128 lb (58.06 kg)  08/13/13 128 lb (58.06 kg)  07/22/13 124 lb 3.2 oz (56.337 kg)  06/19/13 128 lb 12 oz (58.4 kg)  06/19/13 128 lb 12 oz (58.4  kg)  06/19/13 128 lb 12.8 oz (58.423 kg)  06/16/13 130 lb 8 oz (59.194 kg)  06/01/13 139 lb 8.8 oz (63.3 kg)    Usual Body Weight: 128  % Usual Body Weight: 100%   BMI:  Body mass index is 20.67 kg/(m^2).  Estimated Nutritional Needs: Kcal: 1500-1700 Protein: 75 - 85 g  Fluid: >/= 1.5 L   Skin: Incision Left Groin   Diet Order: Clear Liquid  EDUCATION NEEDS: -No education needs identified at this time   Intake/Output Summary (Last 24 hours) at 09/12/13 1035 Last data filed at 09/12/13 0926  Gross per 24 hour  Intake 1476.67 ml  Output   1075 ml  Net 401.67 ml    Last BM: 4/16    Labs:   Recent Labs Lab 09/11/13 1724 09/12/13 0626  NA 142 140  K 3.8 3.9  CL 100 106  CO2 26 23  BUN 12 10  CREATININE 0.63 0.60  CALCIUM 10.1 8.5  GLUCOSE 107* 94    CBG (last 3)  No results found for this basename: GLUCAP,  in the last 72 hours  Scheduled Meds: . enoxaparin (LOVENOX) injection  40 mg Subcutaneous Q24H  . HYDROmorphone      . HYDROmorphone        Continuous Infusions: . 0.9 % NaCl with KCl 20 mEq / L 100 mL/hr at 09/12/13 0050    Past Medical  History  Diagnosis Date  . Hypercholesteremia   . GERD (gastroesophageal reflux disease)   . Arthritis   . Osteoporosis   . ANA positive 01/2001  . Cancer     partial hysterectomy age 47-uterine cancer  . SBO (small bowel obstruction) 06/20/2103  . Ileus, postoperative 07/02/2013  . Hx of tobacco use, presenting hazards to health 07/02/2013  . Arthritis 07/02/2013    ANA positive    Past Surgical History  Procedure Laterality Date  . Abdominal hysterectomy    . Colonoscopy  10/25/2011    Procedure: COLONOSCOPY;  Surgeon: Rogene Houston, MD;  [facility];  Service: Endoscopy;  Laterality: N/A;  930  . Tonsillectomy    . Esophagogastroduodenoscopy  02/2009  . Laparotomy N/A 06/20/2013    Procedure: EXPLORATORY LAPAROTOMY;  Surgeon: Harl Bowie, MD;  Location: [facility];  Service: General;   Laterality: N/A;  . Bowel resection N/A 06/20/2013    Procedure: SMALL BOWEL RESECTION;  Surgeon: Harl Bowie, MD;  Location: [facility];  Service: General;  Laterality: N/A;    Carrolyn Leigh, BS Nutrition Intern Pager: [phone]

## 2013-09-12 NOTE — Op Note (Signed)
Terri Harris, Terri Harris                 ACCOUNT NO.:  [id number]  MEDICAL RECORD NO.:  [id number]  LOCATION:  [id number]                        FACILITY:  [facility]  PHYSICIAN:  Coralie Keens, M.D. DATE OF BIRTH:  [birth year]  DATE OF PROCEDURE:  09/11/2013 DATE OF DISCHARGE:                              OPERATIVE REPORT   PREOPERATIVE DIAGNOSIS:  Incarcerated left inguinal hernia.  POSTOPERATIVE DIAGNOSIS:  Incarcerated left femoral hernia.  PROCEDURE:  Repair of incarcerated left femoral hernia with mesh.  SURGEON:  Coralie Keens, M.D.  ANESTHESIA:  General and 0.25% Marcaine.  ESTIMATED BLOOD LOSS:  Minimal.  INDICATIONS:  This is a 73 year old female who has had a previous exploratory laparotomy for small bowel obstruction in January.  At that time, she was found to have a what appeared to be a small inguinal hernia, which was repaired with few sutures through her exploratory laparotomy incision.  She developed a sudden bulge in the left groin today and start developing nausea and vomiting.  A CAT scan was performed showing an incarcerated hernia.  She was transferred from [facility] to [facility] and taken emergently to the operating room.  FINDINGS:  The patient was found to have an incarcerated left femoral hernia containing small bowel.  The small bowel was dusky but upon releasing it from the hernia it appeared to pink up and remained viable. I examined it for over 5 minutes and it continued to become pink and had good peristalsis.  Decision was made to forego any resection.  PROCEDURE IN DETAIL:  The patient was brought to the operating room, identified as Terri Harris.  She was placed supine on the operating table and general anesthesia induced.  Her abdomen was then prepped and draped in usual sterile fashion.  I made a longitudinal incision in the patient's left groin with the scalpel and took this down to Scarpa fascia with electrocautery.  The external oblique fascia  was then identified.  The patient was found to have a femoral hernia.  I had to open up the femoral canal and take down the inguinal ligament in order to free up the hernia.  I opened up the sac.  It was found to contain small bowel.  The small bowel was dusky in appearance, but after eviscerating several inches proximal distal in this area, it appeared to pink up nicely with good peristalsis.  At this point, the decision was made to forego resection of this and reduce it back into the abdominal cavity.  I then closed off the base of the sac with a 2-0 silk suture. At this point, I repaired the inguinal ligament with figure-of-eight 2-0 Vicryl suture.  I then brought a piece of Parietex ProGrip mesh onto the field.  I placed it as an onlay on the inguinal floor covering the femoral canal as well.  I then sutured in place with interrupted 2-0 Vicryl sutures.  Good repair of the inguinal floor and femoral canal appeared to be achieved.  At this point, I closed the external oblique fascia with interrupted Vicryl suture.  I then closed the Scarpa fascia with interrupted 3-0 Vicryl sutures and closed the skin with a running 4-0  Monocryl.  Steri-Strips, gauze, and tape were then applied.  The patient tolerated the procedure well.  All the counts were correct at the end of procedure.  The patient was then extubated in the operating room and taken in stable condition to recovery room.     Coralie Keens, M.D.     DB/MEDQ  D:  09/11/2013  T:  09/12/2013  Job:  [id number]

## 2013-09-12 NOTE — Progress Notes (Signed)
Agree with dietetic intern assessment note.  Karelyn Brisby Barnett RD, LDN Inpatient Clinical Dietitian Pager: [phone] After Hours Pager: [phone]  

## 2013-09-13 LAB — BASIC METABOLIC PANEL
BUN: 4 mg/dL — ABNORMAL LOW (ref 6–23)
CO2: 25 mEq/L (ref 19–32)
Calcium: 8.6 mg/dL (ref 8.4–10.5)
Chloride: 108 mEq/L (ref 96–112)
Creatinine, Ser: 0.51 mg/dL (ref 0.50–1.10)
GFR calc Af Amer: >90 mL/min (ref >90)
GFR calc non Af Amer: >90 mL/min (ref >90)
Glucose, Bld: 98 mg/dL (ref 70–99)
Potassium: 4.2 mEq/L (ref 3.7–5.3)
Sodium: 142 mEq/L (ref 137–147)

## 2013-09-13 LAB — CBC
HCT: 36.9 % (ref 36.0–46.0)
Hemoglobin: 11.8 g/dL — ABNORMAL LOW (ref 12.0–15.0)
MCH: 31.1 pg (ref 26.0–34.0)
MCHC: 32 g/dL (ref 30.0–36.0)
MCV: 97.4 fL (ref 78.0–100.0)
Platelets: 193 10*3/uL (ref 150–400)
RBC: 3.79 MIL/uL — ABNORMAL LOW (ref 3.87–5.11)
RDW: 13.6 % (ref 11.5–15.5)
WBC: 6.6 10*3/uL (ref 4.0–10.5)

## 2013-09-13 NOTE — Progress Notes (Signed)
2 Days Post-Op  Subjective: Pt okay, feels very bloated, pain well controlled.  No flatus or BM yet.  Tolerating some clears.  Appetite low.  Walked through the halls some.  Daughter at bedside  Objective: Vital signs in last 24 hours: Temp:  [97.9 F (36.6 C)-98.3 F (36.8 C)] 97.9 F (36.6 C) (04/18 0601) Pulse Rate:  [65-76] 76 (04/18 0601) Resp:  [16-17] 16 (04/18 0601) BP: (116-131)/(58-66) 126/62 mmHg (04/18 0601) SpO2:  [93 %-97 %] 97 % (04/18 0601) Last BM Date: 09/11/13  Intake/Output from previous day: 04/17 0701 - 04/18 0700 In: 2140 [P.O.:1240; I.V.:900] Out: -  Intake/Output this shift:    PE: Gen:  Alert, NAD, pleasant Abd: Soft, mild tenderness, mild distension, +BS, no HSM, Left groin incision with sanguinous drainage   Lab Results:   Recent Labs  09/12/13 0626 09/13/13 0630  WBC 8.0 6.6  HGB 12.8 11.8*  HCT 39.5 36.9  PLT 209 193   BMET  Recent Labs  09/12/13 0626 09/13/13 0630  NA 140 142  K 3.9 4.2  CL 106 108  CO2 23 25  GLUCOSE 94 98  BUN 10 4*  CREATININE 0.60 0.51  CALCIUM 8.5 8.6   PT/INR No results found for this basename: LABPROT, INR,  in the last 72 hours CMP     Component Value Date/Time   NA 142 09/13/2013 0630   K 4.2 09/13/2013 0630   CL 108 09/13/2013 0630   CO2 25 09/13/2013 0630   GLUCOSE 98 09/13/2013 0630   BUN 4* 09/13/2013 0630   CREATININE 0.51 09/13/2013 0630   CALCIUM 8.6 09/13/2013 0630   PROT 9.1* 09/11/2013 1724   ALBUMIN 4.6 09/11/2013 1724   AST 19 09/11/2013 1724   ALT 9 09/11/2013 1724   ALKPHOS 92 09/11/2013 1724   BILITOT 0.2* 09/11/2013 1724   GFRNONAA >90 09/13/2013 0630   GFRAA >90 09/13/2013 0630   Lipase     Component Value Date/Time   LIPASE 44 12/10/2010 1500       Studies/Results: Ct Abdomen Pelvis W Contrast  09/11/2013   CLINICAL DATA:  Abdominal pain and small bowel obstruction by x-ray.  EXAM: CT ABDOMEN AND PELVIS WITH CONTRAST  TECHNIQUE: Multidetector CT imaging of the abdomen and  pelvis was performed using the standard protocol following bolus administration of intravenous contrast.  CONTRAST:  164mL OMNIPAQUE IOHEXOL 300 MG/ML  SOLN  COMPARISON:  DG ABD ACUTE W/CHEST dated 09/11/2013; CT ABD - PELV W/ CM dated 05/31/2013  FINDINGS: There is evidence of a high-grade small bowel obstruction with multiple dilated small bowel loops identified. The largest measured caliber of small bowel is approximately 5.6 cm. The transition point is at the level of a likely incarcerated left inguinal hernia. There is no evidence of free intraperitoneal air, significant free fluid or focal abscess. The colon is decompressed.  The liver, gallbladder, pancreas, spleen, adrenal glands and kidneys are unremarkable. Atherosclerotic disease of the abdominal aorta present without evidence of aneurysm. Bony structures show significant degenerative changes of the lumbar spine. Visualized lung bases shows emphysematous changes and scarring at the left anterior lung base.  IMPRESSION: High-grade small bowel obstruction due to incarcerated left inguinal hernia.  Findings conveyed to Dr. Roderic Palau on 09/11/2013 at 19:02.   Electronically Signed   By: Aletta Edouard M.D.   On: 09/11/2013 19:03   Dg Abd Acute W/chest  09/11/2013   CLINICAL DATA:  Abdominal pain, nausea and vomiting. Left inguinal hernia.  EXAM: ACUTE ABDOMEN  SERIES (ABDOMEN 2 VIEW & CHEST 1 VIEW)  COMPARISON:  DG ABD 2 VIEWS dated 06/23/2013; DG ABD 1 VIEW dated 06/19/2013; CT ABD - PELV W/ CM dated 05/31/2013; DG ABD ACUTE W/CHEST dated 05/31/2013; CT ABD/PELVIS W CM dated 12/10/2010  FINDINGS: There is no evidence of pulmonary edema, consolidation, pneumothorax, nodule or pleural fluid. The heart size is normal.  Significantly dilated central bowel loops likely represent small bowel and contain air-fluid levels. There is evidence of prior bowel resection. Small bowel may measure as large as 8 cm in diameter. No evidence of free air.  IMPRESSION: Evidence of small  bowel obstruction with central dilated small bowel loops measuring nearly 8 cm in greatest diameter. No free air is identified.   Electronically Signed   By: Aletta Edouard M.D.   On: 09/11/2013 18:02    Anti-infectives: Anti-infectives   None       Assessment/Plan POD #2 S/p repair of incarcerated left femoral hernia with mesh---Dr. Ninfa Linden 09/11/13  Post-op ileus ABL Anemia - mild -Continue clears for now until bowel function returns then advance to fulls -IVF, po pain meds  -mobilize, IS -SCDs, lovenox  -remove dressing tomorrow and replace with dry dressing     LOS: 2 days    Coralie Keens 09/13/2013, 9:40 AM Pager: [phone]

## 2013-09-13 NOTE — Progress Notes (Signed)
No flatus. Ambulating. No n/v.   Alert, nad cta b/l Soft, not really distended. +BS L groin - small amount of swelling  Await return of bowel function before adv diet  Leighton Ruff. Redmond Pulling, MD, FACS General, Bariatric, & Minimally Invasive Surgery [facility], [place]

## 2013-09-14 LAB — BASIC METABOLIC PANEL
BUN: 3 mg/dL — ABNORMAL LOW (ref 6–23)
CO2: 24 mEq/L (ref 19–32)
Calcium: 8.4 mg/dL (ref 8.4–10.5)
Chloride: 106 mEq/L (ref 96–112)
Creatinine, Ser: 0.48 mg/dL — ABNORMAL LOW (ref 0.50–1.10)
GFR calc Af Amer: >90 mL/min (ref >90)
GFR calc non Af Amer: >90 mL/min (ref >90)
Glucose, Bld: 87 mg/dL (ref 70–99)
Potassium: 4.1 mEq/L (ref 3.7–5.3)
Sodium: 141 mEq/L (ref 137–147)

## 2013-09-14 MED ORDER — BISACODYL 10 MG RE SUPP
10.0000 mg | Freq: Once | RECTAL | Status: DC
  Filled 2013-09-14: qty 1

## 2013-09-14 MED ORDER — OXYCODONE-ACETAMINOPHEN 5-325 MG PO TABS: 1.0000 | ORAL_TABLET | Freq: Four times a day (QID) | ORAL | Status: DC | PRN

## 2013-09-14 MED ORDER — DOCUSATE SODIUM 100 MG PO CAPS
100.0000 mg | ORAL_CAPSULE | Freq: Two times a day (BID) | ORAL | Status: DC
Start: 2013-09-14 — End: 2013-09-14
  Administered 2013-09-14: 100 mg via ORAL
  Filled 2013-09-14: qty 1

## 2013-09-14 NOTE — Progress Notes (Signed)
Discharge instructions gone over with patient. Home medications gone over. Prescription given. Follow up appointment to be made. Diet, activity, incisional care, and reasons to call the doctor gone over. My chart discussed. Patient verbalized understanding of instructions. She tolerated her soft diet for lunch very well.

## 2013-09-14 NOTE — Discharge Instructions (Signed)
Change dressing over your incision site daily and monitor for bleeding/signs of infection.   [facility], [place] [phone]  OPEN ABDOMINAL SURGERY: POST OP INSTRUCTIONS  Always review your discharge instruction sheet given to you by the facility where your surgery was performed.  IF YOU HAVE DISABILITY OR FAMILY LEAVE FORMS, YOU MUST BRING THEM TO THE OFFICE FOR PROCESSING.  PLEASE DO NOT GIVE THEM TO YOUR DOCTOR.  1. A prescription for pain medication may be given to you upon discharge.  Take your pain medication as prescribed, if needed.  If narcotic pain medicine is not needed, then you may take acetaminophen (Tylenol) or ibuprofen (Advil) as needed. 2. Take your usually prescribed medications unless otherwise directed. 3. If you need a refill on your pain medication, please contact your pharmacy. They will contact our office to request authorization.  Prescriptions will not be filled after 5pm or on week-ends. 4. You should follow a light diet the first few days after arrival home, such as soup and crackers, pudding, etc.unless your doctor has advised otherwise. A high-fiber, low fat diet can be resumed as tolerated.   Be sure to include lots of fluids daily. Most patients will experience some swelling and bruising on the chest and neck area.  Ice packs will help.  Swelling and bruising can take several days to resolve 5. Most patients will experience some swelling and bruising in the area of the incision. Ice pack will help. Swelling and bruising can take several days to resolve..  6. It is common to experience some constipation if taking pain medication after surgery.  Increasing fluid intake and taking a stool softener will usually help or prevent this problem from occurring.  A mild laxative (Milk of Magnesia or Miralax) should be taken according to package directions if there are no bowel movements after 48 hours. 7.  You may have steri-strips (small skin tapes) in  place directly over the incision.  These strips should be left on the skin for 7-10 days.  If your surgeon used skin glue on the incision, you may shower in 24 hours.  The glue will flake off over the next 2-3 weeks.  Any sutures or staples will be removed at the office during your follow-up visit. You may find that a light gauze bandage over your incision may keep your staples from being rubbed or pulled. You may shower and replace the bandage daily. 8. ACTIVITIES:  You may resume regular (light) daily activities beginning the next day--such as daily self-care, walking, climbing stairs--gradually increasing activities as tolerated.  You may have sexual intercourse when it is comfortable.  Refrain from any heavy lifting or straining until approved by your doctor. a. You may drive when you no longer are taking prescription pain medication, you can comfortably wear a seatbelt, and you can safely maneuver your car and apply brakes b. Return to Work: ___________________________________ 26. You should see your doctor in the office for a follow-up appointment approximately two weeks after your surgery.  Make sure that you call for this appointment within a day or two after you arrive home to insure a convenient appointment time. OTHER INSTRUCTIONS:  _____________________________________________________________ _____________________________________________________________  WHEN TO CALL YOUR DOCTOR: 1. Fever over 101.0 2. Inability to urinate 3. Nausea and/or vomiting 4. Extreme swelling or bruising 5. Continued bleeding from incision. 6. Increased pain, redness, or drainage from the incision. 7. Difficulty swallowing or breathing 8. Muscle cramping or spasms. 9. Numbness or  tingling in hands or feet or around lips.  The clinic staff is available to answer your questions during regular business hours.  Please dont hesitate to call and ask to speak to one of the nurses if you have concerns.  For further  questions, please visit [email or web address]

## 2013-09-14 NOTE — Discharge Summary (Signed)
Physician Discharge Summary  Patient ID: Terri Harris MRN: [id number] DOB/AGE: July 05, 1940 73 y.o.  Admit date: 09/11/2013 Discharge date: 09/14/2013  Admitting Diagnosis: Left incarcerated inguinal hernia Nausea/vomiting  Discharge Diagnosis Patient Active Problem List   Diagnosis Date Noted  . Incarcerated femoral hernia 09/11/2013  . Ileus, postoperative 07/02/2013  . GERD (gastroesophageal reflux disease) 07/02/2013  . Hx of tobacco use, presenting hazards to health 07/02/2013  . Arthritis 07/02/2013  . SBO (small bowel obstruction) 06/01/2013  . Dehydration 06/01/2013  . Gastritis 04/13/2013  . Abdominal pain, unspecified site 04/13/2013  . Osteoporosis 04/13/2013  . Personal history of colonic polyps 04/13/2013  . COPD (chronic obstructive pulmonary disease) 04/13/2013    Consultants None  Imaging: No results found.  Procedures Dr. Ninfa Linden (09/11/13) - Repair of incarcerated left femoral hernia with mesh  Hospital Course:  73 y/o white female with h/o Exp lap for SBO with small bowel resection earlier this year. Had known left inguinal hernia that was primarily repaired at the time of surgery.  The hernia recurred on 09/11/13 and developed a painful bulge in the LLQ with N/V.  Workup showed incarcerated left inguinal hernia.  Patient was admitted and underwent procedure listed above.  Tolerated procedure well and was transferred to the floor.  She experienced a post-operative ileus which resolved quickly.  Diet was advanced as tolerated.  On POD #3, the patient was voiding well, tolerating diet, ambulating well, pain well controlled, vital signs stable, incisions c/d/i and felt stable for discharge home.  Patient will follow up in our office in 3 weeks and knows to call with questions or concerns.      Medication List         acetaminophen 500 MG tablet  Commonly known as:  TYLENOL  Take 1,000 mg by mouth every 6 (six) hours as needed. Pain     oxyCODONE-acetaminophen 5-325 MG per tablet  Commonly known as:  PERCOCET/ROXICET  Take 1-2 tablets by mouth every 6 (six) hours as needed for severe pain.     polyethylene glycol packet  Commonly known as:  MIRALAX / GLYCOLAX  Use daily as needed, you should have 1-2 smooth bowel movements per day.         Follow-up Information   Follow up with [facility], MD. Schedule an appointment as soon as possible for a visit in 2 weeks. (For post-operation check in 2-3 weeks)    Specialty:  General Surgery   Contact information:   [address] [place] [address] [phone]       Signed: Excell Seltzer [facility] [phone]  09/14/2013, 12:56 PM

## 2013-09-14 NOTE — Progress Notes (Signed)
3 Days Post-Op  Subjective: Pt feels great.  [birth year].  No N/V, tolerating clears, wants more to eat.  Good flatus, no BM yet.  Ambulating through the halls well.  Pain well controlled on orals.  Objective: Vital signs in last 24 hours: Temp:  [98.3 F (36.8 C)-98.7 F (37.1 C)] 98.6 F (37 C) (04/19 0534) Pulse Rate:  [66-73] 73 (04/19 0534) Resp:  [17-22] 17 (04/19 0534) BP: (114-147)/(68-80) 114/80 mmHg (04/19 0534) SpO2:  [94 %-97 %] 95 % (04/19 0534) Last BM Date: 09/11/13  Intake/Output from previous day: 04/18 0701 - 04/19 0700 In: 8916 [P.O.:1040; I.V.:2600] Out: -  Intake/Output this shift:    PE: Gen:  Alert, NAD, pleasant Abd: Soft, minimal tenderness, ND, +BS, no HSM, incisions with bright red blood on dry dressing, but no active bleeding seen, no HSM   Lab Results:   Recent Labs  09/12/13 0626 09/13/13 0630  WBC 8.0 6.6  HGB 12.8 11.8*  HCT 39.5 36.9  PLT 209 193   BMET  Recent Labs  09/12/13 0626 09/13/13 0630  NA 140 142  K 3.9 4.2  CL 106 108  CO2 23 25  GLUCOSE 94 98  BUN 10 4*  CREATININE 0.60 0.51  CALCIUM 8.5 8.6   PT/INR No results found for this basename: LABPROT, INR,  in the last 72 hours CMP     Component Value Date/Time   NA 142 09/13/2013 0630   K 4.2 09/13/2013 0630   CL 108 09/13/2013 0630   CO2 25 09/13/2013 0630   GLUCOSE 98 09/13/2013 0630   BUN 4* 09/13/2013 0630   CREATININE 0.51 09/13/2013 0630   CALCIUM 8.6 09/13/2013 0630   PROT 9.1* 09/11/2013 1724   ALBUMIN 4.6 09/11/2013 1724   AST 19 09/11/2013 1724   ALT 9 09/11/2013 1724   ALKPHOS 92 09/11/2013 1724   BILITOT 0.2* 09/11/2013 1724   GFRNONAA >90 09/13/2013 0630   GFRAA >90 09/13/2013 0630   Lipase     Component Value Date/Time   LIPASE 44 12/10/2010 1500       Studies/Results: No results found.  Anti-infectives: Anti-infectives   None       Assessment/Plan POD #3 S/p repair of incarcerated left femoral hernia with mesh---Dr.  Ninfa Linden 09/11/13  Post-op ileus  ABL Anemia - mild  -Advance to fulls at breakfast and soft at lunch -Red. IVF, po pain meds  -mobilize, IS  -SCDs, lovenox  -remove dressing replace with dry dressing -possible d/c today after lunch if tolerating and wound stops oozing ? Stopping lovenox?     LOS: 3 days    Coralie Keens 09/14/2013, 7:56 AM Pager: [phone]

## 2013-09-14 NOTE — Progress Notes (Signed)
Doing well. No n/v. Ambulating a lot. +flatus. Tolerating diet  Sitting in chair cta Reg Soft, nd,+BS; dressing c/d/i; mild swelling  If tolerates lunch; d/c after lunch. Discussed d/c instructions with pt F/u Dr Ninfa Linden 3 weeks  Leighton Ruff. Redmond Pulling, MD, FACS General, Bariatric, & Minimally Invasive Surgery [address] [facility], [place]

## 2013-09-15 ENCOUNTER — Encounter (HOSPITAL_COMMUNITY): Payer: Self-pay | Admitting: Surgery

## 2013-09-22 NOTE — Progress Notes (Signed)
This encounter was created in error - please disregard.

## 2013-10-03 ENCOUNTER — Ambulatory Visit (INDEPENDENT_AMBULATORY_CARE_PROVIDER_SITE_OTHER): Payer: Medicare Other | Admitting: Surgery

## 2013-10-03 ENCOUNTER — Encounter (INDEPENDENT_AMBULATORY_CARE_PROVIDER_SITE_OTHER): Payer: Self-pay | Admitting: Surgery

## 2013-10-03 VITALS — BP 122/81 | HR 72 | Temp 97.0°F | Resp 16 | Ht 65.0 in | Wt 129.6 lb

## 2013-10-03 DIAGNOSIS — Z09 Encounter for follow-up examination after completed treatment for conditions other than malignant neoplasm: Secondary | ICD-10-CM

## 2013-10-03 NOTE — Progress Notes (Signed)
Subjective:     Patient ID: Terri Harris, female   DOB: 1940/06/26, 73 y.o.   MRN: [id number]  HPI She is here for her first postop visit status post repair of incarcerated left femoral hernia emergently with mesh. She is doing well has no complaints.  Review of Systems     Objective:   Physical Exam On exam, her incision is well-healed without evidence of recurrent hernia    Assessment:     Stable postop     Plan:     I want her to wait until June 6 before she returns to normal lifting. I will see her back as needed

## 2013-10-21 DIAGNOSIS — E785 Hyperlipidemia, unspecified: Secondary | ICD-10-CM | POA: Diagnosis not present

## 2013-10-21 LAB — LIPID PANEL
Cholesterol: 171 mg/dL (ref 0–200)
HDL: 51 mg/dL (ref >39)
LDL Cholesterol: 95 mg/dL (ref 0–99)
Total CHOL/HDL Ratio: 3.4 Ratio
Triglycerides: 124 mg/dL (ref <150)
VLDL: 25 mg/dL (ref 0–40)

## 2013-10-22 NOTE — Progress Notes (Signed)
Card sent 

## 2013-12-12 ENCOUNTER — Other Ambulatory Visit (HOSPITAL_COMMUNITY): Payer: Self-pay | Admitting: Neurological Surgery

## 2013-12-12 DIAGNOSIS — M5416 Radiculopathy, lumbar region: Secondary | ICD-10-CM

## 2013-12-16 ENCOUNTER — Ambulatory Visit (HOSPITAL_COMMUNITY)
Admission: RE | Admit: 2013-12-16 | Discharge: 2013-12-16 | Disposition: A | Discharge status: Home / Self Care | Payer: Medicare Other | Source: Ambulatory Visit | Attending: Neurological Surgery | Admitting: Neurological Surgery

## 2013-12-16 DIAGNOSIS — M545 Low back pain, unspecified: Secondary | ICD-10-CM | POA: Insufficient documentation

## 2013-12-16 DIAGNOSIS — M5380 Other specified dorsopathies, site unspecified: Secondary | ICD-10-CM | POA: Diagnosis not present

## 2013-12-16 DIAGNOSIS — M4120 Other idiopathic scoliosis, site unspecified: Secondary | ICD-10-CM | POA: Diagnosis not present

## 2013-12-16 DIAGNOSIS — M5416 Radiculopathy, lumbar region: Secondary | ICD-10-CM

## 2013-12-16 DIAGNOSIS — M5126 Other intervertebral disc displacement, lumbar region: Secondary | ICD-10-CM | POA: Diagnosis not present

## 2013-12-16 DIAGNOSIS — M48061 Spinal stenosis, lumbar region without neurogenic claudication: Secondary | ICD-10-CM | POA: Diagnosis not present

## 2014-02-05 DIAGNOSIS — R030 Elevated blood-pressure reading, without diagnosis of hypertension: Secondary | ICD-10-CM | POA: Diagnosis not present

## 2014-02-05 DIAGNOSIS — IMO0002 Reserved for concepts with insufficient information to code with codable children: Secondary | ICD-10-CM | POA: Diagnosis not present

## 2014-02-13 DIAGNOSIS — IMO0002 Reserved for concepts with insufficient information to code with codable children: Secondary | ICD-10-CM | POA: Diagnosis not present

## 2014-02-13 DIAGNOSIS — M4180 Other forms of scoliosis, site unspecified: Secondary | ICD-10-CM | POA: Diagnosis not present

## 2014-02-13 DIAGNOSIS — M47817 Spondylosis without myelopathy or radiculopathy, lumbosacral region: Secondary | ICD-10-CM | POA: Diagnosis not present

## 2014-03-11 DIAGNOSIS — M5417 Radiculopathy, lumbosacral region: Secondary | ICD-10-CM | POA: Diagnosis not present

## 2014-03-11 DIAGNOSIS — R030 Elevated blood-pressure reading, without diagnosis of hypertension: Secondary | ICD-10-CM | POA: Diagnosis not present

## 2014-03-27 ENCOUNTER — Ambulatory Visit (INDEPENDENT_AMBULATORY_CARE_PROVIDER_SITE_OTHER): Payer: Medicare Other | Admitting: Family Medicine

## 2014-03-27 ENCOUNTER — Encounter: Payer: Self-pay | Admitting: Family Medicine

## 2014-03-27 VITALS — BP 138/86 | Ht 65.0 in | Wt 121.0 lb

## 2014-03-27 DIAGNOSIS — M810 Age-related osteoporosis without current pathological fracture: Secondary | ICD-10-CM | POA: Diagnosis not present

## 2014-03-27 DIAGNOSIS — I739 Peripheral vascular disease, unspecified: Secondary | ICD-10-CM | POA: Diagnosis not present

## 2014-03-27 DIAGNOSIS — Z23 Encounter for immunization: Secondary | ICD-10-CM | POA: Diagnosis not present

## 2014-03-27 DIAGNOSIS — I10 Essential (primary) hypertension: Secondary | ICD-10-CM | POA: Insufficient documentation

## 2014-03-27 DIAGNOSIS — Z1231 Encounter for screening mammogram for malignant neoplasm of breast: Secondary | ICD-10-CM

## 2014-03-27 MED ORDER — AMLODIPINE BESYLATE 2.5 MG PO TABS: 2.5000 mg | ORAL_TABLET | Freq: Every day | ORAL | Status: DC

## 2014-03-27 NOTE — Patient Instructions (Signed)
DASH Eating Plan °DASH stands for "Dietary Approaches to Stop Hypertension." The DASH eating plan is a healthy eating plan that has been shown to reduce high blood pressure (hypertension). Additional health benefits may include reducing the risk of type 2 diabetes mellitus, heart disease, and stroke. The DASH eating plan may also help with weight loss. °WHAT DO I NEED TO KNOW ABOUT THE DASH EATING PLAN? °For the DASH eating plan, you will follow these general guidelines: °· Choose foods with a percent daily value for sodium of less than 5% (as listed on the food label). °· Use salt-free seasonings or herbs instead of table salt or sea salt. °· Check with your health care provider or pharmacist before using salt substitutes. °· Eat lower-sodium products, often labeled as "lower sodium" or "no salt added." °· Eat fresh foods. °· Eat more vegetables, fruits, and low-fat dairy products. °· Choose whole grains. Look for the word "whole" as the first word in the ingredient list. °· Choose fish and skinless chicken or turkey more often than red meat. Limit fish, poultry, and meat to 6 oz (170 g) each day. °· Limit sweets, desserts, sugars, and sugary drinks. °· Choose heart-healthy fats. °· Limit cheese to 1 oz (28 g) per day. °· Eat more home-cooked food and less restaurant, buffet, and fast food. °· Limit fried foods. °· Cook foods using methods other than frying. °· Limit canned vegetables. If you do use them, rinse them well to decrease the sodium. °· When eating at a restaurant, ask that your food be prepared with less salt, or no salt if possible. °WHAT FOODS CAN I EAT? °Seek help from a dietitian for individual calorie needs. °Grains °Whole grain or whole wheat bread. Brown rice. Whole grain or whole wheat pasta. Quinoa, bulgur, and whole grain cereals. Low-sodium cereals. Corn or whole wheat flour tortillas. Whole grain cornbread. Whole grain crackers. Low-sodium crackers. °Vegetables °Fresh or frozen vegetables  (raw, steamed, roasted, or grilled). Low-sodium or reduced-sodium tomato and vegetable juices. Low-sodium or reduced-sodium tomato sauce and paste. Low-sodium or reduced-sodium canned vegetables.  °Fruits °All fresh, canned (in natural juice), or frozen fruits. °Meat and Other Protein Products °Ground beef (85% or leaner), grass-fed beef, or beef trimmed of fat. Skinless chicken or turkey. Ground chicken or turkey. Pork trimmed of fat. All fish and seafood. Eggs. Dried beans, peas, or lentils. Unsalted nuts and seeds. Unsalted canned beans. °Dairy °Low-fat dairy products, such as skim or 1% milk, 2% or reduced-fat cheeses, low-fat ricotta or cottage cheese, or plain low-fat yogurt. Low-sodium or reduced-sodium cheeses. °Fats and Oils °Tub margarines without trans fats. Light or reduced-fat mayonnaise and salad dressings (reduced sodium). Avocado. Safflower, olive, or canola oils. Natural peanut or almond butter. °Other °Unsalted popcorn and pretzels. °The items listed above may not be a complete list of recommended foods or beverages. Contact your dietitian for more options. °WHAT FOODS ARE NOT RECOMMENDED? °Grains °White bread. White pasta. White rice. Refined cornbread. Bagels and croissants. Crackers that contain trans fat. °Vegetables °Creamed or fried vegetables. Vegetables in a cheese sauce. Regular canned vegetables. Regular canned tomato sauce and paste. Regular tomato and vegetable juices. °Fruits °Dried fruits. Canned fruit in light or heavy syrup. Fruit juice. °Meat and Other Protein Products °Fatty cuts of meat. Ribs, chicken wings, bacon, sausage, bologna, salami, chitterlings, fatback, hot dogs, bratwurst, and packaged luncheon meats. Salted nuts and seeds. Canned beans with salt. °Dairy °Whole or 2% milk, cream, half-and-half, and cream cheese. Whole-fat or sweetened yogurt. Full-fat   cheeses or blue cheese. Nondairy creamers and whipped toppings. Processed cheese, cheese spreads, or cheese  curds. °Condiments °Onion and garlic salt, seasoned salt, table salt, and sea salt. Canned and packaged gravies. Worcestershire sauce. Tartar sauce. Barbecue sauce. Teriyaki sauce. Soy sauce, including reduced sodium. Steak sauce. Fish sauce. Oyster sauce. Cocktail sauce. Horseradish. Ketchup and mustard. Meat flavorings and tenderizers. Bouillon cubes. Hot sauce. Tabasco sauce. Marinades. Taco seasonings. Relishes. °Fats and Oils °Butter, stick margarine, lard, shortening, ghee, and bacon fat. Coconut, palm kernel, or palm oils. Regular salad dressings. °Other °Pickles and olives. Salted popcorn and pretzels. °The items listed above may not be a complete list of foods and beverages to avoid. Contact your dietitian for more information. °WHERE CAN I FIND MORE INFORMATION? °National Heart, Lung, and Blood Institute: www.nhlbi.nih.gov/health/health-topics/topics/dash/ °Document Released: 05/04/2011 Document Revised: 09/29/2013 Document Reviewed: 03/19/2013 °ExitCare® Patient Information ©2015 [employer]. This information is not intended to replace advice given to you by your health care provider. Make sure you discuss any questions you have with your health care provider. ° °

## 2014-03-27 NOTE — Progress Notes (Signed)
   Subjective:    Patient ID: Terri Harris, female    DOB: 04/19/1941, 73 y.o.   MRN: [id number]  Hypertension   Patient arrives with complaint of elevated blood pressure and back pain. Long discussion held with patient regarding her blood pressure she uses too much salt she does not get enough exercise and she does relate low back pain discomfort she saw specialist she was also having leg pain as well they told her that they felt it was related to possible circulation issues in her leg. 25 minutes spent with patient discussing all of these issues  Review of Systems  Constitutional: Negative for activity change, appetite change and fatigue.  Endocrine: Negative for polydipsia and polyphagia.  Genitourinary: Negative for frequency.  Neurological: Negative for weakness.  Psychiatric/Behavioral: Negative for confusion.       Objective:   Physical Exam  Vitals reviewed. Constitutional: She appears well-nourished. No distress.  Cardiovascular: Normal rate, regular rhythm and normal heart sounds.   No murmur heard. Pulmonary/Chest: Effort normal and breath sounds normal. No respiratory distress.  Musculoskeletal: She exhibits no edema.  Lymphadenopathy:    She has no cervical adenopathy.  Neurological: She is alert. She exhibits normal muscle tone.  Psychiatric: Her behavior is normal.   Somewhat diminished pulses in the right leg       Assessment & Plan:  Lumbar pain- Dr Ellene Route, MRI,shot, may need surgery ( disc)   HTN-blood pressure is elevated start amlodipine 2.5 mg each day recheck her in 4 weeks.  Possible claudication right leg check arterial flow if normal may need referral for nerve conduction studies the back specialist stated that he did not feel the lower leg pain was coming from the back but it seems to be either neuropathic or vascular patient denies knee pain  History of osteoporosis check bone density

## 2014-04-06 ENCOUNTER — Ambulatory Visit (HOSPITAL_COMMUNITY)
Admission: RE | Admit: 2014-04-06 | Discharge: 2014-04-06 | Disposition: A | Discharge status: Home / Self Care | Payer: Medicare Other | Source: Ambulatory Visit | Attending: Family Medicine | Admitting: Family Medicine

## 2014-04-06 DIAGNOSIS — Z1231 Encounter for screening mammogram for malignant neoplasm of breast: Secondary | ICD-10-CM | POA: Insufficient documentation

## 2014-04-06 DIAGNOSIS — M8580 Other specified disorders of bone density and structure, unspecified site: Secondary | ICD-10-CM | POA: Diagnosis not present

## 2014-04-06 DIAGNOSIS — I739 Peripheral vascular disease, unspecified: Secondary | ICD-10-CM | POA: Insufficient documentation

## 2014-04-06 DIAGNOSIS — M810 Age-related osteoporosis without current pathological fracture: Secondary | ICD-10-CM | POA: Diagnosis not present

## 2014-04-06 DIAGNOSIS — R200 Anesthesia of skin: Secondary | ICD-10-CM | POA: Insufficient documentation

## 2014-04-07 ENCOUNTER — Other Ambulatory Visit (HOSPITAL_COMMUNITY): Payer: Medicare Other

## 2014-04-07 NOTE — Progress Notes (Signed)
Patient said, "she will pass" on the referral for the neurologist and the Neurontin.

## 2014-04-08 ENCOUNTER — Telehealth: Payer: Self-pay | Admitting: Family Medicine

## 2014-04-08 NOTE — Telephone Encounter (Signed)
This patient does have osteoporosis. In the past she tried Fosamax. The patient has options she can either do Fosamax orally or we can go with injectable at the hospital. I have concerns that the Fosamax could cause severe reflux or gastritis issues with the patient. That IV treatment would not cause this. Please ask the patient if she had problems with the Fosamax when she took it the last time area

## 2014-04-09 ENCOUNTER — Other Ambulatory Visit: Payer: Self-pay | Admitting: Neurological Surgery

## 2014-04-09 ENCOUNTER — Other Ambulatory Visit: Payer: Self-pay | Admitting: *Deleted

## 2014-04-09 MED ORDER — ALENDRONATE SODIUM 70 MG PO TABS: 70.0000 mg | ORAL_TABLET | ORAL | Status: DC

## 2014-04-09 NOTE — Telephone Encounter (Signed)
Pt wants to go back on Fosamax. Per Dr Nicki Reaper, I sent in rx to pharmacy. Pt notified and verbalized understanding.

## 2014-04-09 NOTE — Addendum Note (Signed)
Addended byCharolotte Capuchin D on: 04/09/2014 08:18 AM   Modules accepted: Orders

## 2014-04-27 ENCOUNTER — Encounter: Payer: Self-pay | Admitting: Family Medicine

## 2014-04-27 ENCOUNTER — Ambulatory Visit (INDEPENDENT_AMBULATORY_CARE_PROVIDER_SITE_OTHER): Payer: Medicare Other | Admitting: Family Medicine

## 2014-04-27 VITALS — BP 158/90 | Ht 67.0 in | Wt 126.0 lb

## 2014-04-27 DIAGNOSIS — I10 Essential (primary) hypertension: Secondary | ICD-10-CM | POA: Diagnosis not present

## 2014-04-27 MED ORDER — AMLODIPINE BESYLATE 5 MG PO TABS: 5.0000 mg | ORAL_TABLET | Freq: Every day | ORAL | Status: DC

## 2014-04-27 NOTE — Progress Notes (Signed)
   Subjective:    Patient ID: Terri Harris, female    DOB: 1941-05-02, 73 y.o.   MRN: [id number]  HPIHTN recheck. Was started on amlodipine 2.5 mg 4 weeks ago. Having headaches. Not checking BP at home.   Patient is having infrequent headaches not severe no vomiting with it She denies any problem with medication She is having surgery coming up for her back in the near future it is going to be 3 nights in the hospital. He regularly walks and picks up wood without any chest pain or shortness of breath  Review of Systems     Objective:   Physical Exam  Lungs clear Heart regular Pulse normal Blood pressure good Extremities no edema      Assessment & Plan:  #1 HTN slightly subpar control increase amlodipine now use 5 mg daily In addition to this highly recommend patient stay physically active. I believe she is perfectly fine to go ahead and have her surgery in January. She regularly stays physically active that exceeds 4 METS and her risk for heart event with this surgery is low She was told that she needs a talked to surgery regarding risk of DVT.  Recheck blood pressure in a proximally 4 weeks

## 2014-05-28 ENCOUNTER — Other Ambulatory Visit (HOSPITAL_COMMUNITY): Payer: Medicare Other

## 2014-06-01 ENCOUNTER — Ambulatory Visit (INDEPENDENT_AMBULATORY_CARE_PROVIDER_SITE_OTHER): Payer: Medicare Other | Admitting: Family Medicine

## 2014-06-01 ENCOUNTER — Encounter: Payer: Self-pay | Admitting: Family Medicine

## 2014-06-01 VITALS — BP 140/70 | Ht 67.0 in | Wt 128.4 lb

## 2014-06-01 DIAGNOSIS — I10 Essential (primary) hypertension: Secondary | ICD-10-CM | POA: Diagnosis not present

## 2014-06-01 NOTE — Progress Notes (Signed)
   Subjective:    Patient ID: Terri Harris, female    DOB: 19-Jun-1940, 74 y.o.   MRN: [id number]  Hypertension This is a chronic problem. The current episode started more than 1 year ago. The problem has been gradually improving since onset. The problem is controlled. There are no associated agents to hypertension. There are no known risk factors for coronary artery disease. Treatments tried: amlodipine. The current treatment provides significant improvement. There are no compliance problems.    Patient states that she has no other concerns at this time.  She has an upcoming surgery she is a little nervous about it but overall feeling confident things will go okay  Review of Systems She denies any chest tightness pressure pain shortness breath or swelling in the legs    Objective:   Physical Exam Her lungs are clear no crackles heart regular pulses normal blood pressure both arms is good a proximally 136/76 on the left side extremities no edema skin warm dry       Assessment & Plan:  #1 HTN decent control continue current measures low-salt normal physical activity/walking recommended. Follow-up again in April or May  She has a back surgery coming up. Her heart disease risk is low. She is able to walk extensively without chest pain or shortness of breath. Approved for surgery.

## 2014-06-02 ENCOUNTER — Encounter (HOSPITAL_COMMUNITY): Payer: Self-pay

## 2014-06-02 ENCOUNTER — Encounter (HOSPITAL_COMMUNITY)
Admission: RE | Admit: 2014-06-02 | Discharge: 2014-06-02 | Disposition: A | Discharge status: Home / Self Care | Payer: Medicare Other | Source: Ambulatory Visit | Attending: Neurological Surgery | Admitting: Neurological Surgery

## 2014-06-02 DIAGNOSIS — I10 Essential (primary) hypertension: Secondary | ICD-10-CM | POA: Diagnosis not present

## 2014-06-02 DIAGNOSIS — E780 Pure hypercholesterolemia: Secondary | ICD-10-CM | POA: Diagnosis not present

## 2014-06-02 DIAGNOSIS — M4186 Other forms of scoliosis, lumbar region: Secondary | ICD-10-CM | POA: Diagnosis not present

## 2014-06-02 DIAGNOSIS — M4726 Other spondylosis with radiculopathy, lumbar region: Secondary | ICD-10-CM | POA: Diagnosis not present

## 2014-06-02 HISTORY — DX: Essential (primary) hypertension: I10

## 2014-06-02 HISTORY — DX: Pneumonia, unspecified organism: J18.9

## 2014-06-02 LAB — TYPE AND SCREEN
ABO/RH(D): A POS
Antibody Screen: NEGATIVE

## 2014-06-02 LAB — BASIC METABOLIC PANEL
Anion gap: 10 (ref 5–15)
BUN: 9 mg/dL (ref 6–23)
CO2: 26 mmol/L (ref 19–32)
Calcium: 9.5 mg/dL (ref 8.4–10.5)
Chloride: 103 mEq/L (ref 96–112)
Creatinine, Ser: 0.68 mg/dL (ref 0.50–1.10)
GFR calc Af Amer: >90 mL/min (ref >90)
GFR calc non Af Amer: 85 mL/min — ABNORMAL LOW (ref >90)
Glucose, Bld: 89 mg/dL (ref 70–99)
Potassium: 3.7 mmol/L (ref 3.5–5.1)
Sodium: 139 mmol/L (ref 135–145)

## 2014-06-02 LAB — CBC
HCT: 41.7 % (ref 36.0–46.0)
Hemoglobin: 13.8 g/dL (ref 12.0–15.0)
MCH: 32.2 pg (ref 26.0–34.0)
MCHC: 33.1 g/dL (ref 30.0–36.0)
MCV: 97.4 fL (ref 78.0–100.0)
Platelets: 229 10*3/uL (ref 150–400)
RBC: 4.28 MIL/uL (ref 3.87–5.11)
RDW: 12.6 % (ref 11.5–15.5)
WBC: 6.7 10*3/uL (ref 4.0–10.5)

## 2014-06-02 LAB — ABO/RH: ABO/RH(D): A POS

## 2014-06-02 LAB — SURGICAL PCR SCREEN
MRSA, PCR: NEGATIVE
Staphylococcus aureus: NEGATIVE

## 2014-06-02 NOTE — Pre-Procedure Instructions (Signed)
Terri Harris  06/02/2014   Your procedure is scheduled on:  Monday June 08, 2014 at 7:30 AM.  Report to [facility] Admitting at 5:30 AM.  Call this number if you have problems the morning of surgery: [phone]   For any other questions Monday-Friday from 8am-4pm call: [phone]   Remember:   Do not eat food or drink liquids after midnight.   Take these medicines the morning of surgery with A SIP OF WATER: Acetaminophen (Tylenol) if needed, Amlodipine (Norvasc)   Do not wear jewelry, make-up or nail polish.  Do not wear lotions, powders, or perfumes. You may NOT wear deodorant.  Do not shave 48 hours prior to surgery.  Do not bring valuables to the hospital.  [facility] is not responsible for any belongings or valuables.               Contacts, dentures or bridgework may not be worn into surgery.  Leave suitcase in the car. After surgery it may be brought to your room.  For patients admitted to the hospital, discharge time is determined by your treatment team.               Patients discharged the day of surgery will not be allowed to drive home.  Name and phone number of your driver:   Special Instructions: Shower using CHG soap the night before and the morning of your surgery   Please read over the following fact sheets that you were given: Pain Booklet, Coughing and Deep Breathing, Blood Transfusion Information, MRSA Information and Surgical Site Infection Prevention

## 2014-06-02 NOTE — Progress Notes (Signed)
PCP is [facility], and LOV was yesterday on 06/01/14. Patient denied having any acute cardiac or pulmonary issues. Ebony Hail, [place] informed of EKG and comparison EKG found in EPIC on 05/31/13.

## 2014-06-03 NOTE — Progress Notes (Signed)
Anesthesia Chart Review: Patient is a 74 year old female scheduled for L2-3, L3-4, L4-5 anterolateral decompression/fusion on 06/08/14 by Dr. Ellene Route.  History includes former smoker, GERD, hypercholesterolemia, HTN, arthritis, hysterectomy, SBO with incarcerated omentum s/p small bowel resection and left IHR 05/2013, incarcerated left femoral hernia s/p repair 08/2013. PCP is Dr. Sallee Lange who approved her for upcoming surgery.   06/02/14 EKG: NSR with sinus arrhythmia, possible septal infarct (age undetermined).  EKG was not felt significantly changed from prior tracing.   09/11/13 1V CXR: There is no evidence of pulmonary edema, consolidation, pneumothorax, nodule or pleural fluid. The heart size is normal.  Preoperative labs noted.   If no acute changes then I anticipate that she can proceed as planned.  George Hugh [facility]/Anesthesiology Phone [phone] 06/03/2014 10:42 AM

## 2014-06-07 MED ORDER — CEFAZOLIN SODIUM-DEXTROSE 2-3 GM-% IV SOLR
2.0000 g | INTRAVENOUS | Status: AC
  Administered 2014-06-08 (×2): 2 g via INTRAVENOUS
  Filled 2014-06-07: qty 50

## 2014-06-07 NOTE — H&P (Signed)
Terri Harris is an 74 y.o. female.   Chief Complaint: Back and bilateral lower extremity pain with scoliosis HPI: Patient is a 74 year old individual who's had significant problems with degenerative scoliosis and increasing back pain. She has primarily a curvature noted between L2-3 and L4-5 with the apex being at L3-4. This is been progressive. She's been advised regarding need for surgical decompression and posterior percutaneous stabilization. She is now admitted for the surgery.  Past Medical History  Diagnosis Date  . Hypercholesteremia   . GERD (gastroesophageal reflux disease)   . Arthritis   . Osteoporosis   . ANA positive 01/2001  . Cancer     partial hysterectomy age 13-uterine cancer  . SBO (small bowel obstruction) 06/20/2103  . Ileus, postoperative 07/02/2013  . Hx of tobacco use, presenting hazards to health 07/02/2013  . Arthritis 07/02/2013    ANA positive  . Hypertension   . Pneumonia     hx of    Past Surgical History  Procedure Laterality Date  . Colonoscopy  10/25/2011    Procedure: COLONOSCOPY;  Surgeon: Rogene Houston, MD;  Location: [facility];  Service: Endoscopy;  Laterality: N/A;  930  . Tonsillectomy    . Esophagogastroduodenoscopy  02/2009  . Laparotomy N/A 06/20/2013    Procedure: EXPLORATORY LAPAROTOMY;  Surgeon: Harl Bowie, MD;  Location: [place];  Service: General;  Laterality: N/A;  . Bowel resection N/A 06/20/2013    Procedure: SMALL BOWEL RESECTION;  Surgeon: Harl Bowie, MD;  Location: [place];  Service: General;  Laterality: N/A;  . Inguinal hernia repair Left 09/11/2013    Procedure: HERNIA REPAIR INGUINAL INCARCERATED;  Surgeon: Harl Bowie, MD;  Location: [place];  Service: General;  Laterality: Left;  . Abdominal hysterectomy    . Hernia repair Left     groin  . Colon surgery      Family History  Problem Relation Age of Onset  . Anesthesia problems Neg Hx   . Hypotension Neg Hx   . Malignant hyperthermia Neg Hx   .  Pseudochol deficiency Neg Hx   . Hypertension Mother   . Heart disease Father   . Diabetes Sister   . Heart disease Sister   . Diabetes Brother   . Heart disease Brother    Social History:  reports that she quit smoking about 2 years ago. Her smoking use included Cigarettes. She has a 5 pack-year smoking history. She does not have any smokeless tobacco history on file. She reports that she does not drink alcohol or use illicit drugs.  Allergies:  Allergies  Allergen Reactions  . Codeine Nausea And Vomiting    No prescriptions prior to admission    No results found for this or any previous visit (from the past 48 hour(s)). No results found.  Review of Systems  HENT: Negative.   Eyes: Negative.   Respiratory: Negative.   Cardiovascular: Negative.   Gastrointestinal: Negative.   Genitourinary: Negative.   Musculoskeletal: Positive for back pain.  Skin: Negative.   Neurological: Positive for weakness.  Endo/Heme/Allergies: Negative.   Psychiatric/Behavioral: Negative.     There were no vitals taken for this visit. Physical Exam  Constitutional: She is oriented to person, place, and time. She appears well-developed and well-nourished.  HENT:  Head: Normocephalic and atraumatic.  Eyes: Conjunctivae and EOM are normal. Pupils are equal, round, and reactive to light.  Neck: Normal range of motion. Neck supple.  Cardiovascular: Normal rate and regular rhythm.  Respiratory: Effort normal and breath sounds normal.  GI: Soft. Bowel sounds are normal.  Neurological: She is alert and oriented to person, place, and time.  Absent deep tendon reflexes motor strength in the major groups in the lower extremities reveals 4 out of 5 strength in iliopsoas and quadriceps tibialis anterior and gastrocs appear to have good strength with intact dorsi and plantar flexion.  Skin: Skin is warm and dry.  Psychiatric: She has a normal mood and affect. Her behavior is normal. Judgment and thought  content normal.     Assessment/Plan Degenerative scoliosis L2-3 L3-4 and L4-5. Lumbar spondylosis stenosis with radiculopathy.  Surgical decompression via anterolateral discectomy anterior lumbar interbody arthrodesis with spacers percutaneous pedicle screw fixation from L2-L5.  Ader Fritze J 06/07/2014, 10:12 PM

## 2014-06-08 ENCOUNTER — Inpatient Hospital Stay (HOSPITAL_COMMUNITY): Payer: Medicare Other | Admitting: Vascular Surgery

## 2014-06-08 ENCOUNTER — Encounter (HOSPITAL_COMMUNITY)
Admission: RE | Disposition: A | Discharge status: Home / Self Care | Payer: Self-pay | Source: Ambulatory Visit | Attending: Neurological Surgery

## 2014-06-08 ENCOUNTER — Encounter (HOSPITAL_COMMUNITY): Payer: Self-pay | Admitting: *Deleted

## 2014-06-08 ENCOUNTER — Inpatient Hospital Stay (HOSPITAL_COMMUNITY)
Admission: RE | Admit: 2014-06-08 | Discharge: 2014-06-11 | DRG: 458 | Disposition: A | Discharge status: Home / Self Care | Payer: Medicare Other | Source: Ambulatory Visit | Attending: Neurological Surgery | Admitting: Neurological Surgery

## 2014-06-08 ENCOUNTER — Inpatient Hospital Stay (HOSPITAL_COMMUNITY): Payer: Medicare Other | Admitting: Anesthesiology

## 2014-06-08 ENCOUNTER — Inpatient Hospital Stay (HOSPITAL_COMMUNITY): Payer: Medicare Other

## 2014-06-08 DIAGNOSIS — M4726 Other spondylosis with radiculopathy, lumbar region: Secondary | ICD-10-CM | POA: Diagnosis present

## 2014-06-08 DIAGNOSIS — Z87891 Personal history of nicotine dependence: Secondary | ICD-10-CM | POA: Diagnosis not present

## 2014-06-08 DIAGNOSIS — M5416 Radiculopathy, lumbar region: Secondary | ICD-10-CM | POA: Diagnosis not present

## 2014-06-08 DIAGNOSIS — E780 Pure hypercholesterolemia: Secondary | ICD-10-CM | POA: Diagnosis present

## 2014-06-08 DIAGNOSIS — Z01812 Encounter for preprocedural laboratory examination: Secondary | ICD-10-CM

## 2014-06-08 DIAGNOSIS — M79605 Pain in left leg: Secondary | ICD-10-CM | POA: Diagnosis not present

## 2014-06-08 DIAGNOSIS — M810 Age-related osteoporosis without current pathological fracture: Secondary | ICD-10-CM | POA: Diagnosis present

## 2014-06-08 DIAGNOSIS — M4186 Other forms of scoliosis, lumbar region: Secondary | ICD-10-CM | POA: Diagnosis not present

## 2014-06-08 DIAGNOSIS — Z01810 Encounter for preprocedural cardiovascular examination: Secondary | ICD-10-CM

## 2014-06-08 DIAGNOSIS — M4806 Spinal stenosis, lumbar region: Secondary | ICD-10-CM | POA: Diagnosis not present

## 2014-06-08 DIAGNOSIS — K219 Gastro-esophageal reflux disease without esophagitis: Secondary | ICD-10-CM | POA: Diagnosis not present

## 2014-06-08 DIAGNOSIS — I10 Essential (primary) hypertension: Secondary | ICD-10-CM | POA: Diagnosis not present

## 2014-06-08 DIAGNOSIS — Z8542 Personal history of malignant neoplasm of other parts of uterus: Secondary | ICD-10-CM | POA: Diagnosis not present

## 2014-06-08 DIAGNOSIS — M1990 Unspecified osteoarthritis, unspecified site: Secondary | ICD-10-CM | POA: Diagnosis not present

## 2014-06-08 DIAGNOSIS — Z981 Arthrodesis status: Secondary | ICD-10-CM | POA: Diagnosis not present

## 2014-06-08 DIAGNOSIS — M48062 Spinal stenosis, lumbar region with neurogenic claudication: Secondary | ICD-10-CM | POA: Diagnosis present

## 2014-06-08 DIAGNOSIS — Z419 Encounter for procedure for purposes other than remedying health state, unspecified: Secondary | ICD-10-CM

## 2014-06-08 HISTORY — PX: ANTERIOR LAT LUMBAR FUSION: SHX1168

## 2014-06-08 HISTORY — PX: LUMBAR PERCUTANEOUS PEDICLE SCREW 3 LEVEL: SHX5562

## 2014-06-08 SURGERY — ANTERIOR LATERAL LUMBAR FUSION 3 LEVELS
Anesthesia: General | Site: Spine Lumbar

## 2014-06-08 MED ORDER — BUPIVACAINE HCL (PF) 0.5 % IJ SOLN
INTRAMUSCULAR | Status: DC | PRN
  Administered 2014-06-08: 14 mL
  Administered 2014-06-08: 6.5 mL
  Administered 2014-06-08: 4 mL

## 2014-06-08 MED ORDER — MIDAZOLAM HCL 2 MG/2ML IJ SOLN
INTRAMUSCULAR | Status: AC
  Filled 2014-06-08: qty 2

## 2014-06-08 MED ORDER — DEXAMETHASONE SODIUM PHOSPHATE 10 MG/ML IJ SOLN
INTRAMUSCULAR | Status: DC | PRN
  Administered 2014-06-08: 10 mg via INTRAVENOUS

## 2014-06-08 MED ORDER — ONDANSETRON HCL 4 MG/2ML IJ SOLN
4.0000 mg | Freq: Once | INTRAMUSCULAR | Status: AC | PRN
  Administered 2014-06-08: 4 mg via INTRAVENOUS

## 2014-06-08 MED ORDER — LIDOCAINE-EPINEPHRINE 1 %-1:100000 IJ SOLN
INTRAMUSCULAR | Status: DC | PRN
  Administered 2014-06-08: 6.5 mL

## 2014-06-08 MED ORDER — FENTANYL CITRATE 0.05 MG/ML IJ SOLN
INTRAMUSCULAR | Status: AC
  Filled 2014-06-08: qty 5

## 2014-06-08 MED ORDER — SODIUM CHLORIDE 0.9 % IJ SOLN
3.0000 mL | Freq: Two times a day (BID) | INTRAMUSCULAR | Status: DC
  Administered 2014-06-08 – 2014-06-10 (×3): 3 mL via INTRAVENOUS

## 2014-06-08 MED ORDER — HYDROMORPHONE HCL 1 MG/ML IJ SOLN
INTRAMUSCULAR | Status: AC
  Filled 2014-06-08: qty 1

## 2014-06-08 MED ORDER — METHOCARBAMOL 500 MG PO TABS
500.0000 mg | ORAL_TABLET | Freq: Four times a day (QID) | ORAL | Status: DC | PRN
  Administered 2014-06-08: 500 mg via ORAL
  Filled 2014-06-08 (×3): qty 1

## 2014-06-08 MED ORDER — PROPOFOL 10 MG/ML IV BOLUS
INTRAVENOUS | Status: DC | PRN
  Administered 2014-06-08: 150 mg via INTRAVENOUS

## 2014-06-08 MED ORDER — HYDROCODONE-ACETAMINOPHEN 5-325 MG PO TABS
1.0000 | ORAL_TABLET | ORAL | Status: DC | PRN
  Administered 2014-06-08 – 2014-06-09 (×2): 2 via ORAL
  Filled 2014-06-08 (×2): qty 2

## 2014-06-08 MED ORDER — MORPHINE SULFATE 2 MG/ML IJ SOLN
1.0000 mg | INTRAMUSCULAR | Status: DC | PRN
  Administered 2014-06-08 – 2014-06-09 (×4): 2 mg via INTRAVENOUS
  Filled 2014-06-08 (×4): qty 1

## 2014-06-08 MED ORDER — DEXAMETHASONE SODIUM PHOSPHATE 10 MG/ML IJ SOLN
INTRAMUSCULAR | Status: AC
  Filled 2014-06-08: qty 1

## 2014-06-08 MED ORDER — SODIUM CHLORIDE 0.9 % IV SOLN
INTRAVENOUS | Status: DC
  Administered 2014-06-08: 18:00:00 via INTRAVENOUS

## 2014-06-08 MED ORDER — ONDANSETRON HCL 4 MG/2ML IJ SOLN
INTRAMUSCULAR | Status: AC
  Filled 2014-06-08: qty 2

## 2014-06-08 MED ORDER — 0.9 % SODIUM CHLORIDE (POUR BTL) OPTIME
TOPICAL | Status: DC | PRN
  Administered 2014-06-08: 1000 mL

## 2014-06-08 MED ORDER — FLEET ENEMA 7-19 GM/118ML RE ENEM: 1.0000 | ENEMA | Freq: Once | RECTAL | Status: AC | PRN

## 2014-06-08 MED ORDER — POLYETHYLENE GLYCOL 3350 17 G PO PACK
17.0000 g | PACK | Freq: Every day | ORAL | Status: DC | PRN
  Filled 2014-06-08: qty 1

## 2014-06-08 MED ORDER — METHOCARBAMOL 1000 MG/10ML IJ SOLN
500.0000 mg | Freq: Four times a day (QID) | INTRAVENOUS | Status: DC | PRN
  Administered 2014-06-08 – 2014-06-10 (×2): 500 mg via INTRAVENOUS
  Filled 2014-06-08 (×4): qty 5

## 2014-06-08 MED ORDER — ONDANSETRON HCL 4 MG/2ML IJ SOLN
INTRAMUSCULAR | Status: AC
  Administered 2014-06-08: 4 mg via INTRAVENOUS
  Filled 2014-06-08: qty 2

## 2014-06-08 MED ORDER — ACETAMINOPHEN 325 MG PO TABS: 650.0000 mg | ORAL_TABLET | ORAL | Status: DC | PRN

## 2014-06-08 MED ORDER — ACETAMINOPHEN 650 MG RE SUPP
650.0000 mg | RECTAL | Status: DC | PRN
Start: 2014-06-08 — End: 2014-06-11

## 2014-06-08 MED ORDER — SENNA 8.6 MG PO TABS
1.0000 | ORAL_TABLET | Freq: Two times a day (BID) | ORAL | Status: DC
  Administered 2014-06-08 – 2014-06-11 (×5): 8.6 mg via ORAL
  Filled 2014-06-08 (×6): qty 1

## 2014-06-08 MED ORDER — KETOROLAC TROMETHAMINE 15 MG/ML IJ SOLN
15.0000 mg | Freq: Four times a day (QID) | INTRAMUSCULAR | Status: AC
  Administered 2014-06-08 – 2014-06-09 (×5): 15 mg via INTRAVENOUS
  Filled 2014-06-08 (×4): qty 1

## 2014-06-08 MED ORDER — SODIUM CHLORIDE 0.9 % IV SOLN: 250.0000 mL | INTRAVENOUS | Status: DC

## 2014-06-08 MED ORDER — PHENYLEPHRINE HCL 10 MG/ML IJ SOLN
INTRAMUSCULAR | Status: DC | PRN
  Administered 2014-06-08 (×7): 80 ug via INTRAVENOUS

## 2014-06-08 MED ORDER — LIDOCAINE HCL 4 % MT SOLN
OROMUCOSAL | Status: DC | PRN
  Administered 2014-06-08: 4 mL via TOPICAL

## 2014-06-08 MED ORDER — ONDANSETRON HCL 4 MG/2ML IJ SOLN
INTRAMUSCULAR | Status: DC | PRN
  Administered 2014-06-08: 4 mg via INTRAVENOUS

## 2014-06-08 MED ORDER — SUCCINYLCHOLINE CHLORIDE 20 MG/ML IJ SOLN
INTRAMUSCULAR | Status: DC | PRN
  Administered 2014-06-08: 80 mg via INTRAVENOUS

## 2014-06-08 MED ORDER — ROCURONIUM BROMIDE 50 MG/5ML IV SOLN
INTRAVENOUS | Status: AC
  Filled 2014-06-08: qty 1

## 2014-06-08 MED ORDER — PHENOL 1.4 % MT LIQD: 1.0000 | OROMUCOSAL | Status: DC | PRN

## 2014-06-08 MED ORDER — FENTANYL CITRATE 0.05 MG/ML IJ SOLN
INTRAMUSCULAR | Status: DC | PRN
  Administered 2014-06-08: 50 ug via INTRAVENOUS
  Administered 2014-06-08 (×4): 100 ug via INTRAVENOUS
  Administered 2014-06-08 (×3): 50 ug via INTRAVENOUS
  Administered 2014-06-08: 100 ug via INTRAVENOUS

## 2014-06-08 MED ORDER — EPHEDRINE SULFATE 50 MG/ML IJ SOLN
INTRAMUSCULAR | Status: AC
  Filled 2014-06-08: qty 1

## 2014-06-08 MED ORDER — POLYETHYLENE GLYCOL 3350 17 G PO PACK
17.0000 g | PACK | Freq: Every day | ORAL | Status: DC
  Administered 2014-06-08 – 2014-06-11 (×4): 17 g via ORAL
  Filled 2014-06-08 (×3): qty 1

## 2014-06-08 MED ORDER — SODIUM CHLORIDE 0.9 % IJ SOLN: 3.0000 mL | INTRAMUSCULAR | Status: DC | PRN

## 2014-06-08 MED ORDER — HYDROMORPHONE HCL 1 MG/ML IJ SOLN
0.2500 mg | INTRAMUSCULAR | Status: DC | PRN
  Administered 2014-06-08 (×5): 0.5 mg via INTRAVENOUS

## 2014-06-08 MED ORDER — MENTHOL 3 MG MT LOZG: 1.0000 | LOZENGE | OROMUCOSAL | Status: DC | PRN

## 2014-06-08 MED ORDER — PHENYLEPHRINE 40 MCG/ML (10ML) SYRINGE FOR IV PUSH (FOR BLOOD PRESSURE SUPPORT)
PREFILLED_SYRINGE | INTRAVENOUS | Status: AC
  Filled 2014-06-08: qty 10

## 2014-06-08 MED ORDER — EPHEDRINE SULFATE 50 MG/ML IJ SOLN
INTRAMUSCULAR | Status: DC | PRN
  Administered 2014-06-08: 5 mg via INTRAVENOUS
  Administered 2014-06-08 (×2): 10 mg via INTRAVENOUS

## 2014-06-08 MED ORDER — LACTATED RINGERS IV SOLN
INTRAVENOUS | Status: DC | PRN
  Administered 2014-06-08 (×4): via INTRAVENOUS

## 2014-06-08 MED ORDER — ALUM & MAG HYDROXIDE-SIMETH 200-200-20 MG/5ML PO SUSP: 30.0000 mL | Freq: Four times a day (QID) | ORAL | Status: DC | PRN

## 2014-06-08 MED ORDER — OXYCODONE-ACETAMINOPHEN 5-325 MG PO TABS
1.0000 | ORAL_TABLET | ORAL | Status: DC | PRN
  Administered 2014-06-09 – 2014-06-11 (×8): 2 via ORAL
  Filled 2014-06-08 (×8): qty 2

## 2014-06-08 MED ORDER — MIDAZOLAM HCL 5 MG/5ML IJ SOLN
INTRAMUSCULAR | Status: DC | PRN
  Administered 2014-06-08: 1 mg via INTRAVENOUS

## 2014-06-08 MED ORDER — SUCCINYLCHOLINE CHLORIDE 20 MG/ML IJ SOLN
INTRAMUSCULAR | Status: AC
  Filled 2014-06-08: qty 1

## 2014-06-08 MED ORDER — BISACODYL 10 MG RE SUPP: 10.0000 mg | Freq: Every day | RECTAL | Status: DC | PRN

## 2014-06-08 MED ORDER — HYDROMORPHONE HCL 1 MG/ML IJ SOLN
INTRAMUSCULAR | Status: AC
  Administered 2014-06-08: 0.5 mg via INTRAVENOUS
  Filled 2014-06-08: qty 1

## 2014-06-08 MED ORDER — CEFAZOLIN SODIUM 1-5 GM-% IV SOLN
1.0000 g | Freq: Three times a day (TID) | INTRAVENOUS | Status: AC
  Administered 2014-06-08 – 2014-06-09 (×2): 1 g via INTRAVENOUS
  Filled 2014-06-08 (×2): qty 50

## 2014-06-08 MED ORDER — THROMBIN 5000 UNITS EX SOLR
CUTANEOUS | Status: DC | PRN
  Administered 2014-06-08 (×2): 5000 [IU] via TOPICAL

## 2014-06-08 MED ORDER — DOCUSATE SODIUM 100 MG PO CAPS
100.0000 mg | ORAL_CAPSULE | Freq: Two times a day (BID) | ORAL | Status: DC
  Administered 2014-06-08 – 2014-06-11 (×5): 100 mg via ORAL
  Filled 2014-06-08 (×6): qty 1

## 2014-06-08 MED ORDER — KETOROLAC TROMETHAMINE 15 MG/ML IJ SOLN
INTRAMUSCULAR | Status: AC
  Administered 2014-06-08: 15 mg via INTRAVENOUS
  Filled 2014-06-08: qty 1

## 2014-06-08 MED ORDER — SODIUM CHLORIDE 0.9 % IJ SOLN
INTRAMUSCULAR | Status: AC
  Filled 2014-06-08: qty 10

## 2014-06-08 MED ORDER — LIDOCAINE HCL (CARDIAC) 20 MG/ML IV SOLN
INTRAVENOUS | Status: AC
  Filled 2014-06-08: qty 5

## 2014-06-08 MED ORDER — PROPOFOL 10 MG/ML IV BOLUS
INTRAVENOUS | Status: AC
  Filled 2014-06-08: qty 20

## 2014-06-08 MED ORDER — ONDANSETRON HCL 4 MG/2ML IJ SOLN
4.0000 mg | INTRAMUSCULAR | Status: DC | PRN
  Administered 2014-06-08 – 2014-06-10 (×2): 4 mg via INTRAVENOUS
  Filled 2014-06-08 (×2): qty 2

## 2014-06-08 MED ORDER — LIDOCAINE HCL (CARDIAC) 20 MG/ML IV SOLN
INTRAVENOUS | Status: DC | PRN
  Administered 2014-06-08: 100 mg via INTRAVENOUS

## 2014-06-08 MED ORDER — BACITRACIN 50000 UNITS IM SOLR
INTRAMUSCULAR | Status: DC | PRN
  Administered 2014-06-08: 09:00:00

## 2014-06-08 MED ORDER — AMLODIPINE BESYLATE 5 MG PO TABS
5.0000 mg | ORAL_TABLET | Freq: Every day | ORAL | Status: DC
  Administered 2014-06-08 – 2014-06-11 (×4): 5 mg via ORAL
  Filled 2014-06-08 (×4): qty 1

## 2014-06-08 SURGICAL SUPPLY — 63 items
BAG DECANTER FOR FLEXI CONT (MISCELLANEOUS) ×1 IMPLANT
BLADE CLIPPER SURG (BLADE) IMPLANT
BONE MATRIX OSTEOCEL PRO MED (Bone Implant) ×4 IMPLANT
BONE MATRIX OSTEOCEL PRO SM (Bone Implant) ×2 IMPLANT
CLIP NEUROVISION LG (CLIP) ×2 IMPLANT
CONT SPEC 4OZ CLIKSEAL STRL BL (MISCELLANEOUS) ×1 IMPLANT
CORENT WIDE 10X22X55 (Orthopedic Implant) ×3 IMPLANT
COROENT WIDE 10X22X55 (Orthopedic Implant) IMPLANT
COROENT XL 12X22X55 (Orthopedic Implant) ×2 IMPLANT
COROENT XL-W 10X22X50 (Orthopedic Implant) ×2 IMPLANT
COVER BACK TABLE 24X17X13 BIG (DRAPES) IMPLANT
COVER BACK TABLE 60X90IN (DRAPES) ×1 IMPLANT
DRAPE C-ARM 42X72 X-RAY (DRAPES) ×6 IMPLANT
DRAPE C-ARMOR (DRAPES) ×6 IMPLANT
DRAPE LAPAROTOMY 100X72X124 (DRAPES) ×6 IMPLANT
DRAPE POUCH INSTRU U-SHP 10X18 (DRAPES) ×8 IMPLANT
DURAPREP 26ML APPLICATOR (WOUND CARE) ×6 IMPLANT
ELECT REM PT RETURN 9FT ADLT (ELECTROSURGICAL) ×6
ELECTRODE REM PT RTRN 9FT ADLT (ELECTROSURGICAL) ×2 IMPLANT
GAUZE SPONGE 4X4 16PLY XRAY LF (GAUZE/BANDAGES/DRESSINGS) IMPLANT
GLOVE BIOGEL PI IND STRL 8.5 (GLOVE) ×2 IMPLANT
GLOVE BIOGEL PI INDICATOR 8.5 (GLOVE) ×4
GLOVE ECLIPSE 8.5 STRL (GLOVE) ×6 IMPLANT
GLOVE EXAM NITRILE LRG STRL (GLOVE) IMPLANT
GLOVE EXAM NITRILE MD LF STRL (GLOVE) IMPLANT
GLOVE EXAM NITRILE XL STR (GLOVE) IMPLANT
GLOVE EXAM NITRILE XS STR PU (GLOVE) IMPLANT
GOWN STRL REUS W/ TWL LRG LVL3 (GOWN DISPOSABLE) IMPLANT
GOWN STRL REUS W/ TWL XL LVL3 (GOWN DISPOSABLE) ×3 IMPLANT
GOWN STRL REUS W/TWL 2XL LVL3 (GOWN DISPOSABLE) ×10 IMPLANT
GOWN STRL REUS W/TWL LRG LVL3 (GOWN DISPOSABLE)
GOWN STRL REUS W/TWL XL LVL3 (GOWN DISPOSABLE) ×3
GUIDEWIRE NITINOL BEVEL TIP (WIRE) ×12 IMPLANT
KIT BASIN OR (CUSTOM PROCEDURE TRAY) ×6 IMPLANT
KIT DILATOR XLIF 5 (KITS) IMPLANT
KIT NDL NVM5 EMG ELECT (KITS) IMPLANT
KIT NEEDLE NVM5 EMG ELECT (KITS) ×1 IMPLANT
KIT NEEDLE NVM5 EMG ELECTRODE (KITS) ×2
KIT ROOM TURNOVER OR (KITS) ×3 IMPLANT
KIT SURGICAL ACCESS MAXCESS 4 (KITS) ×2 IMPLANT
KIT XLIF (KITS) ×2
LIQUID BAND (GAUZE/BANDAGES/DRESSINGS) ×7 IMPLANT
MARKER SKIN DUAL TIP RULER LAB (MISCELLANEOUS) ×3 IMPLANT
NDL HYPO 25X1 1.5 SAFETY (NEEDLE) ×2 IMPLANT
NEEDLE HYPO 25X1 1.5 SAFETY (NEEDLE) ×3 IMPLANT
NS IRRIG 1000ML POUR BTL (IV SOLUTION) ×6 IMPLANT
PACK LAMINECTOMY NEURO (CUSTOM PROCEDURE TRAY) ×6 IMPLANT
PAD ARMBOARD 7.5X6 YLW CONV (MISCELLANEOUS) ×3 IMPLANT
ROD RELINE MAS LORD 5.5X85MM (Rod) ×2 IMPLANT
SCREW LOCK RELINE 5.5 TULIP (Screw) ×12 IMPLANT
SCREW MAS RELINE 6.5X45 POLY (Screw) ×12 IMPLANT
SPONGE LAP 4X18 X RAY DECT (DISPOSABLE) IMPLANT
SPONGE SURGIFOAM ABS GEL SZ50 (HEMOSTASIS) IMPLANT
SUT VIC AB 1 CT1 18XBRD ANBCTR (SUTURE) ×1 IMPLANT
SUT VIC AB 1 CT1 8-18 (SUTURE) ×6
SUT VIC AB 2-0 CP2 18 (SUTURE) ×8 IMPLANT
SUT VIC AB 3-0 SH 8-18 (SUTURE) ×8 IMPLANT
SYR 20ML ECCENTRIC (SYRINGE) ×6 IMPLANT
TAPE CLOTH 3X10 TAN LF (GAUZE/BANDAGES/DRESSINGS) ×9 IMPLANT
TOWEL OR 17X24 6PK STRL BLUE (TOWEL DISPOSABLE) ×4 IMPLANT
TOWEL OR 17X26 10 PK STRL BLUE (TOWEL DISPOSABLE) ×4 IMPLANT
TRAY FOLEY CATH 14FRSI W/METER (CATHETERS) ×4 IMPLANT
WATER STERILE IRR 1000ML POUR (IV SOLUTION) ×6 IMPLANT

## 2014-06-08 NOTE — Op Note (Signed)
Date of surgery: 06/08/2014 Preoperative diagnosis: Degenerative scoliosis stenosis L2-3 L3-4 L4-5 with radiculopathy, back pain Postoperative diagnosis: Degenerative scoliosis and stenosis L2-3 L3-4 L4-5 with radiculopathy and back pain Procedure: Anterolateral decompression L2-3 L3-4 L4-5 with X lift spacers allograft, segmental fixation with pedicle screws L2-L5. Surgeon: Kristeen Miss First assistant: Deri Fuelling M.D. Anesthesia: Gen. endotracheal Indications: Patient is a 74 year old individual who's had significant back and bilateral lower extremity pain she's had spondylosis in the lower lumbar spine and recently has advanced degenerative changes with degenerative scoliosis apex to the left side. His identified that she had about 30 of rotation and requires surgical decompression and stabilization from L2-L5 in order to reduce her scoliosis and decompressed for central canal.  Procedure: The patient was brought to the operating room supine on a stretcher. After the smooth induction of general endotracheal anesthesia, she was turned into the right lateral decubitus position and electrodes are placed for neuro monitoring from the L1-L5 and S1 dermatomes. Then she was taped into position and the right lateral decubitus position and radiographic confirmation to identify the location in the approach to the sites at L2-3 L3-4 and L4-5 was performed. The skin was prepped with alcohol and DuraPrep and draped in a sterile fashion. After infiltrating each and incisional area with approximately 3 mL of lidocaine with epinephrine mixed 50-50 with half percent Marcaine the first incision was made over the superior iliac crest on the left side for the L4-L5 approach. A second incision was made posteriorly about 1 fingerbreadths distance so that the retroperitoneal space could be a dental I entered and the tip of dissector could be guided to the psoas muscle with direct palpation. With this a blunt cannulated  probe was passed over and through the opening to the region of L4-L5. When radiographic confirmation was obtained over the disc space in the most dorsal third of the lateral distance of the disc space a K wire was passed into the disc space at L4-L5. Radio graphic confirmation was obtained. A series of dilators were then passed with stimulation being performed to identify the pads of any exiting nerve roots. It was noted that one of the nerve roots likely L4 was passing close to the probe on the dorsal aspect of it. The retractor was then placed into this area and care was taken to retract gently at first identified the path of the nerve and make sure it was behind the dorsal most retractor arm. Then under direct visualization patient was passed into the disc space at L4-L5 being sure not to have any nerve root in its path. The disc space was then opened with a 15 blade after some soft tissue was cleared from the lateral aspect of the disc may series of curettes and rongeurs were used to remove a substantial quantity of severely degenerated and desiccated material from the disc space. As noted that through this approach the right side was more collapsed and significant osteophytosis was identified on the right side. The lateral portion of ligament was opened using a Cobb elevator projected superiorly then projected inferiorly to open the lateral portion of the ligament. The disc space was then completely evacuated of any material could be reached using a combination of curettes rongeurs and respiratory to clear the endplates. When we were certain that there was good bony contact from the endplates series of sizers were used to distract the interspace and ultimately was felt that a 12 mm 10 lordotic spacer measuring 55 mm in width would  fit best into this interval. This was prepared and filled with Osteo cell. It was then tamped into position under radiographic confirmation. With this the wound was checked for  hemostasis the retractor was withdrawn under direct visualization and the next level was prepared for similar decompression at L3-4. The same procedure was repeated with the same number of steps however the ultimate size of the spacer at L3-L4 was 10 mm tall lordotic 5 mm in width. Once this space was instrumented in a similar fashion attention was turned L2-3 were the space was decompressed and the spacer chosen was 10 mm tall lordotic and 50 mm in width also filled with osseous cell. Once all the spacers were placed radiographic confirmation of all 3 of them was obtained and there was noted to be a moderate amount of the rotation of her degenerative scoliosis. The wounds were then checked for hemostasis the fascia was closed with 2-0 Vicryl interrupted fashion 3-0 Vicryl was used in the subcuticular skin. The patient was then turned prone for percutaneous pedicle screw fixation from L2-L5.  After the patient was repositioned onto the operating table in the prone position the back was prepped with alcohol and DuraPrep and draped in a sterile fashion. Fluoroscopic guidance was used to locate pedicle entry sites at L5 bilaterally and the skin above the chosen area on the oblique view was infiltrated with 1% lidocaine with epinephrine and a small vertical incision was created a Jamshidi type needle with O diagnostic monitoring was then used to penetrate the soft tissues and the lateral aspect of the pedicle as visualized on the x-rays was instrumented. Jamshidi needle was advanced through the pedicle to the vertebral body. There was stimulation of the probe which is the inner cannula of the down to the level of the vertebral wall. The inner cannula was removed and a K wire was then passed into the vertebral body this was done bilaterally at L5 bilaterally at L2 and unilaterally on the right at L3 Laterally on the left at L4. In each space then 6.5 x 45 mm pedicle screw was placed. Then by measuring distance between  the screw heads was decided to use a 90 mm precontoured rod to connect the screws on the left side together and 85 mm precontoured rod on the right side to connect the screws together. This was then passed under direct visualization between the 3 screw heads on each side and Towers were used secure the rod into position in the saddles of the screws while the screws were tightened in the neutral construct. Once the screws were torqued encounter torque salves or removed. Final radiographs were obtained in [facility] and lateral projections identified good reduction of the degenerative scoliosis. Interbody spaces were also noted to be in good position. With this the wounds were irrigated copiously with antibiotic irrigating solution the fascia was closed 2-0 Vicryl interrupted fashion 3-0 Vicryl was used subcuticularly and Dermabond was placed on the skin blood loss for the entirety of the procedure was estimated at 200 mL.

## 2014-06-08 NOTE — Transfer of Care (Signed)
Immediate Anesthesia Transfer of Care Note  Patient: Terri Harris  Procedure(s) Performed: Procedure(s) with comments: LUMBAR 2-3 LUMBAR 3-4 LUMBAR 4-5 ANTEROLATERAL DECOMPRESSION/FUSION WITH PERCUTANEOUS PEDICLE SCREWS (N/A) - L2-3 L3-4 L4-5 ANTEROLATERAL DECOMPRESSION/FUSION WITH PERCUTANEOUS PEDICLE SCREWS LUMBAR PERCUTANEOUS PEDICLE SCREW 3 LEVEL (N/A)  Patient Location: PACU  Anesthesia Type:General  Level of Consciousness: awake, alert , oriented and patient cooperative  Airway & Oxygen Therapy: Patient Spontanous Breathing and Patient connected to nasal cannula oxygen  Post-op Assessment: Report given to PACU RN, Post -op Vital signs reviewed and stable and Patient moving all extremities X 4  Post vital signs: Reviewed and stable  Complications: No apparent anesthesia complications

## 2014-06-08 NOTE — Anesthesia Preprocedure Evaluation (Signed)
Anesthesia Evaluation  Patient identified by MRN, date of birth, ID band Patient awake    Reviewed: Allergy & Precautions, NPO status , Patient's Chart, lab work & pertinent test results  Airway        Dental   Pulmonary COPDformer smoker,          Cardiovascular hypertension,     Neuro/Psych    GI/Hepatic GERD-  ,  Endo/Other    Renal/GU      Musculoskeletal  (+) Arthritis -,   Abdominal   Peds  Hematology   Anesthesia Other Findings   Reproductive/Obstetrics                             Anesthesia Physical Anesthesia Plan  ASA: II  Anesthesia Plan: General   Post-op Pain Management:    Induction: Intravenous  Airway Management Planned: Oral ETT  Additional Equipment:   Intra-op Plan:   Post-operative Plan: Extubation in OR  Informed Consent: I have reviewed the patients History and Physical, chart, labs and discussed the procedure including the risks, benefits and alternatives for the proposed anesthesia with the patient or authorized representative who has indicated his/her understanding and acceptance.     Plan Discussed with: CRNA, Anesthesiologist and Surgeon  Anesthesia Plan Comments:         Anesthesia Quick Evaluation

## 2014-06-08 NOTE — OR Nursing (Signed)
Needle electrodes removed at end of case from upper and lower extremity

## 2014-06-08 NOTE — Anesthesia Postprocedure Evaluation (Signed)
  Anesthesia Post-op Note  Patient: RHENDA OREGON  Procedure(s) Performed: Procedure(s) with comments: LUMBAR 2-3 LUMBAR 3-4 LUMBAR 4-5 ANTEROLATERAL DECOMPRESSION/FUSION WITH PERCUTANEOUS PEDICLE SCREWS (N/A) - L2-3 L3-4 L4-5 ANTEROLATERAL DECOMPRESSION/FUSION WITH PERCUTANEOUS PEDICLE SCREWS LUMBAR PERCUTANEOUS PEDICLE SCREW 3 LEVEL (N/A)  Patient Location: PACU  Anesthesia Type:General  Level of Consciousness: awake, alert , oriented and patient cooperative  Airway and Oxygen Therapy: Patient Spontanous Breathing  Post-op Pain: mild  Post-op Assessment: Post-op Vital signs reviewed, Patient's Cardiovascular Status Stable, Respiratory Function Stable, Patent Airway, No signs of Nausea or vomiting and Pain level controlled  Post-op Vital Signs: stable  Last Vitals:  Filed Vitals:   06/08/14 1430  BP: 133/61  Pulse: 68  Temp:   Resp: 16    Complications: No apparent anesthesia complications

## 2014-06-08 NOTE — Progress Notes (Signed)
Patient ID: Terri Harris, female   DOB: 01-05-1941, 74 y.o.   MRN: [id number] Vital signs are stable Motor function is intact Doing well overall Comfortable

## 2014-06-09 ENCOUNTER — Encounter (HOSPITAL_COMMUNITY): Payer: Self-pay | Admitting: Neurological Surgery

## 2014-06-09 MED ORDER — ZOLPIDEM TARTRATE 5 MG PO TABS
5.0000 mg | ORAL_TABLET | Freq: Once | ORAL | Status: DC
  Filled 2014-06-09: qty 1

## 2014-06-09 NOTE — Progress Notes (Signed)
Utilization review completed.  

## 2014-06-09 NOTE — Evaluation (Signed)
Physical Therapy Evaluation Patient Details Name: Terri Harris MRN: [id number] DOB: [birth year] Today's Date: 06/09/2014   History of Present Illness   Patient is a 74 year old individual s/p L2-3 L3-4 L4-5 ANTEROLATERAL DECOMPRESSION/FUSION WITH PERCUTANEOUS PEDICLE SCREWS.    Clinical Impression  Patient presents with functional limitations due to deficits listed in PT problem list (see below). Pt with generalized weakness and balance deficits impacting safe mobility. Education provided on back precautions. Encourage use of RW until balance improves due to Min A required at times for safety. Pt will have necessary assist at home. Pt would benefit from skilled PT to improve transfers, gait, balance and mobility so pt can maximize independence and return to PLOF.    Follow Up Recommendations Home health PT;Supervision/Assistance - 24 hour    Equipment Recommendations  None recommended by PT    Recommendations for Other Services       Precautions / Restrictions Precautions Precautions: Fall;Back Precaution Booklet Issued: No Precaution Comments: Able to verbalize 3/3 back precautions.  Required Braces or Orthoses: Spinal Brace Spinal Brace: Lumbar corset Restrictions Weight Bearing Restrictions: No      Mobility  Bed Mobility Overal bed mobility: Needs Assistance Bed Mobility: Rolling;Sidelying to Sit;Sit to Sidelying Rolling: Supervision Sidelying to sit: Supervision Supine to sit: Supervision   Sit to sidelying: Supervision General bed mobility comments: HOB flat, no use of rails to simulate home environment. Cues for log roll technique.  Transfers Overall transfer level: Needs assistance Equipment used: None Transfers: Sit to/from Stand Sit to Stand: Min guard Stand pivot transfers: Supervision       General transfer comment: Min guard for safety.   Ambulation/Gait Ambulation/Gait assistance: Min guard;Min assist Ambulation Distance (Feet): 150 Feet Assistive  device: None Gait Pattern/deviations: Step-through pattern;Decreased stride length;Trunk flexed;Drifts right/left     General Gait Details: Cues for upright posture and to decrease gait speed. Min A at times due to impulsive gait speed and impaired balance.   Stairs            Wheelchair Mobility    Modified Rankin (Stroke Patients Only)       Balance Overall balance assessment: Needs assistance Sitting-balance support: Feet supported;No upper extremity supported Sitting balance-Leahy Scale: Good Sitting balance - Comments: Able to donn LSO sitting EOB without LOB or difficulty.    Standing balance support: During functional activity Standing balance-Leahy Scale: Fair                               Pertinent Vitals/Pain Pain Assessment: No/denies pain Pain Score: 6  Pain Location: Back Pain Descriptors / Indicators: Aching Pain Intervention(s): Monitored during session    Home Living Family/patient expects to be discharged to:: Private residence Living Arrangements: Spouse/significant other Available Help at Discharge: Family;Available 24 hours/day Type of Home: House Home Access: Level entry     Home Layout: One level Home Equipment: Walker - 2 wheels      Prior Function Level of Independence: Independent               Hand Dominance   Dominant Hand: Right    Extremity/Trunk Assessment   Upper Extremity Assessment: Defer to OT evaluation;Overall WFL for tasks assessed           Lower Extremity Assessment: Generalized weakness      Cervical / Trunk Assessment: Normal  Communication   Communication: No difficulties  Cognition Arousal/Alertness: Awake/alert Behavior During Therapy: WFL for tasks assessed/performed  Overall Cognitive Status: Within Functional Limits for tasks assessed                      General Comments      Exercises        Assessment/Plan    PT Assessment Patient needs continued PT  services  PT Diagnosis Generalized weakness;Difficulty walking   PT Problem List Decreased strength;Decreased activity tolerance;Decreased balance;Decreased mobility  PT Treatment Interventions Balance training;Gait training;Patient/family education;Functional mobility training;Therapeutic activities;Therapeutic exercise;Neuromuscular re-education   PT Goals (Current goals can be found in the Care Plan section) Acute Rehab PT Goals Patient Stated Goal: to return home PT Goal Formulation: With patient Time For Goal Achievement: 06/23/14 Potential to Achieve Goals: Fair    Frequency Min 5X/week   Barriers to discharge        Co-evaluation               End of Session Equipment Utilized During Treatment: Gait belt;Back brace Activity Tolerance: Patient tolerated treatment well Patient left: in bed;with call bell/phone within reach;with bed alarm set;with family/visitor present Nurse Communication: Mobility status;Precautions         Time: 7425-9563 PT Time Calculation (min) (ACUTE ONLY): 17 min   Charges:   PT Evaluation $Initial PT Evaluation Tier I: 1 Procedure PT Treatments $Gait Training: 8-22 mins   PT G CodesCandy Sledge A June 30, 2014, 12:26 PM Candy Sledge, [place], DPT [phone]

## 2014-06-09 NOTE — Evaluation (Signed)
Occupational Therapy Evaluation Patient Details Name: Terri Harris MRN: [id number] DOB: April 24, 1941 Today's Date: 06/09/2014    History of Present Illness  Patient is a 74 year old individual who's had significant problems with degenerative scoliosis and increasing back pain. She has primarily a curvature noted between L2-3 and L4-5 with the apex being at L3-4. This is been progressive. She's been advised regarding need for surgical decompression and posterior percutaneous stabilization. Surgery completed 06/08/2014   Clinical Impression   Patient admitted with above. Patient independent PTA. Patient currently requires up to total assist for functional tasks secondary to increased pain, decreased strength, and back precautions. Please see OT problem list below. Feel patient will benefit from acute OT to increase overall independence in the areas of ADLs & functional mobility and in order to safely discharge to venue listed below.      Follow Up Recommendations  Home health OT;Supervision/Assistance - 24 hour    Equipment Recommendations  3 in 1 bedside comode    Recommendations for Other Services  None at this time   Precautions / Restrictions Precautions Precautions: Fall;Back Precaution Comments: Patient unaware of back precautions, therapist educated patient on 3/3 precautions - no bending, no arching, no twisting Required Braces or Orthoses: Spinal Brace Spinal Brace: Lumbar corset Restrictions Weight Bearing Restrictions: No      Mobility Bed Mobility Overal bed mobility: Needs Assistance Bed Mobility: Rolling;Supine to Sit Rolling: Supervision   Supine to sit: Supervision     General bed mobility comments: Using bed rails, patient is supervision for bed mobility. Patient requires supervision secondary to back precautions.   Transfers Overall transfer level: Needs assistance Equipment used: Rolling walker (2 wheeled) Transfers: Sit to/from [employer] Sit to Stand: Supervision Stand pivot transfers: Supervision       General transfer comment: Patient requires min verbal cues for safety with RW during transfers and cues to effectively adhere to back precautions    Balance  Please see PT evaluation     ADL Overall ADL's : Needs assistance/impaired     Grooming: Wash/dry hands;Wash/dry face;Oral care;Set up   Upper Body Bathing: Cueing for safety;Sitting;Set up;Supervision/ safety   Lower Body Bathing: Sit to/from stand;Cueing for safety;Adhering to back precautions;Moderate assistance   Upper Body Dressing : Set up;Cueing for safety;Sitting   Lower Body Dressing: Total assistance;Adhering to back precautions;Sit to/from stand;Cueing for safety   Toilet Transfer: Min guard;RW;Comfort height toilet;Ambulation   Toileting- Clothing Manipulation and Hygiene: Min guard;Adhering to back precautions;Cueing for safety         General ADL Comments: Patient requires up to total assist for LB ADLs secondary to back precautions. Patient verbally educated on hip kit equipment and plan on demonstrating hip kit equipment during next OT session. Patient with 6/10 complaints of pain upon initally entering room, but this improved with mobility according to patient. Patient requires up to max veral cues to adhere to back precautions, educated patient's daughter and patient on donning of lumbar corset and corset wearing schedule. Patient will require 24/7 supervision/assistance once discharged > home.      Vision  Patient wears glasses/contacts at all times, no apparent deficits.    Perception Perception Perception Tested?: No   Praxis Praxis Praxis tested?: Within functional limits    Pertinent Vitals/Pain Pain Assessment: 0-10 Pain Score: 6  Pain Location: Back Pain Descriptors / Indicators: Aching Pain Intervention(s): Monitored during session     Hand Dominance Right   Extremity/Trunk Assessment Upper Extremity  Assessment Upper Extremity Assessment:  Overall [facility] for tasks assessed   Lower Extremity Assessment Lower Extremity Assessment: Defer to PT evaluation   Cervical / Trunk Assessment Cervical / Trunk Assessment: Normal   Communication Communication Communication: No difficulties   Cognition Arousal/Alertness: Awake/alert Behavior During Therapy: WFL for tasks assessed/performed Overall Cognitive Status: Within Functional Limits for tasks assessed             Home Living Family/patient expects to be discharged to:: Private residence Living Arrangements: Spouse/significant other Available Help at Discharge: Family;Available 24 hours/day (Husband) Type of Home: House Home Access: Level entry     Home Layout: One level     Bathroom Shower/Tub: Tub/shower unit Shower/tub characteristics: Curtain Biochemist, clinical: Handicapped height Bathroom Accessibility: Yes How Accessible: Accessible via walker Home Equipment: Walker - 2 wheels          Prior Functioning/Environment Level of Independence: Independent             OT Diagnosis: Generalized weakness;Acute pain   OT Problem List: Decreased strength;Decreased activity tolerance;Impaired balance (sitting and/or standing);Decreased coordination;Decreased safety awareness;Decreased knowledge of use of DME or AE;Decreased knowledge of precautions;Pain   OT Treatment/Interventions: Self-care/ADL training;Therapeutic exercise;Energy conservation;DME and/or AE instruction;Therapeutic activities;Patient/family education;Balance training    OT Goals(Current goals can be found in the care plan section) Acute Rehab OT Goals Patient Stated Goal: none stated OT Goal Formulation: With patient Time For Goal Achievement: 06/23/14 Potential to Achieve Goals: Good ADL Goals Pt Will Perform Grooming: Independently;standing Pt Will Perform Upper Body Bathing: Independently;sitting Pt Will Perform Lower Body Bathing: with modified  independence;sit to/from stand;with adaptive equipment Pt Will Perform Upper Body Dressing: Independently;sitting Pt Will Perform Lower Body Dressing: with modified independence;sit to/from stand;with adaptive equipment Pt Will Transfer to Toilet: with modified independence;ambulating Pt Will Perform Tub/Shower Transfer: Tub transfer;with modified independence;3 in 1;rolling walker Additional ADL Goal #1: Patient will independently adhere to 3/3 back precautions during functional tasks and functional mobility  Additional ADL Goal #2: Patient will independently don lumbar corset in prep for functional mobility/tasks  OT Frequency: Min 2X/week   Barriers to D/C:   None known at this time          End of Session Equipment Utilized During Treatment: Rolling walker;Back brace Nurse Communication: Mobility status  Activity Tolerance: Patient tolerated treatment well Patient left: in chair;with call bell/phone within reach;with family/visitor present   Time: 0920-0950 OT Time Calculation (min): 30 min Charges:  OT General Charges $OT Visit: 1 Procedure OT Evaluation $Initial OT Evaluation Tier I: 1 Procedure OT Treatments $Self Care/Home Management : 8-22 mins  Sargun Rummell , MS, OTR/L, CLT  06/09/2014, 10:10 AM

## 2014-06-09 NOTE — Progress Notes (Signed)
Patient ID: Terri Harris, female   DOB: 1941-01-26, 74 y.o.   MRN: [id number] Vital signs are stable. Incisions are clean and dry Patient's ambulating quite well Motor function appears intact Probable discharge in a.m.

## 2014-06-10 MED ORDER — KETOROLAC TROMETHAMINE 15 MG/ML IJ SOLN
15.0000 mg | Freq: Four times a day (QID) | INTRAMUSCULAR | Status: DC
  Administered 2014-06-10 – 2014-06-11 (×4): 15 mg via INTRAVENOUS
  Filled 2014-06-10 (×4): qty 1

## 2014-06-10 MED ORDER — DEXAMETHASONE 2 MG PO TABS
2.0000 mg | ORAL_TABLET | Freq: Two times a day (BID) | ORAL | Status: DC
  Administered 2014-06-10 – 2014-06-11 (×3): 2 mg via ORAL
  Filled 2014-06-10 (×3): qty 1

## 2014-06-10 NOTE — Progress Notes (Signed)
Physical Therapy Treatment Patient Details Name: Terri Harris MRN: [id number] DOB: 11/17/1940 Today's Date: 06/10/2014    History of Present Illness  Patient is a 74 year old individual s/p L2-3 L3-4 L4-5 ANTEROLATERAL DECOMPRESSION/FUSION WITH PERCUTANEOUS PEDICLE SCREWS.    PT Comments    Pt. Is progressing toward goals for d/c to home. Pt. Ambulated well with normal gait and motivated to walk as daughter will walk with her whenever she chooses. RN made aware.  Follow Up Recommendations  Home health PT;Supervision/Assistance - 24 hour     Equipment Recommendations  None recommended by PT    Recommendations for Other Services       Precautions / Restrictions Precautions Precautions: Fall;Back Precaution Booklet Issued: No Precaution Comments: Able to verbalize 3/3 back precautions.  Required Braces or Orthoses: Spinal Brace Spinal Brace: Lumbar corset Restrictions Weight Bearing Restrictions: No    Mobility  Bed Mobility               General bed mobility comments: up in chair upon arrival  Transfers Overall transfer level: Needs assistance Equipment used: None Transfers: Sit to/from Stand Sit to Stand: Min guard         General transfer comment: Min guard for safety.   Ambulation/Gait Ambulation/Gait assistance: Min guard Ambulation Distance (Feet): 300 Feet Assistive device: None Gait Pattern/deviations: Step-through pattern;Trunk flexed   Gait velocity interpretation: at or above normal speed for age/gender General Gait Details: Cues for upright posture   Stairs            Wheelchair Mobility    Modified Rankin (Stroke Patients Only)       Balance                                    Cognition Arousal/Alertness: Awake/alert Behavior During Therapy: WFL for tasks assessed/performed Overall Cognitive Status: Within Functional Limits for tasks assessed                      Exercises      General Comments         Pertinent Vitals/Pain Pain Assessment: 0-10 Pain Score: 7  Pain Location: back Pain Descriptors / Indicators: Aching Pain Intervention(s): Monitored during session    Home Living                      Prior Function            PT Goals (current goals can now be found in the care plan section) Progress towards PT goals: Progressing toward goals    Frequency  Min 5X/week    PT Plan Current plan remains appropriate    Co-evaluation             End of Session Equipment Utilized During Treatment: Gait belt;Back brace Activity Tolerance: Patient tolerated treatment well Patient left: in chair;with call bell/phone within reach;with family/visitor present     Time: 0955-1010 PT Time Calculation (min) (ACUTE ONLY): 15 min  Charges:                       G Codes:      Jodi Geralds, SPTA 06/10/2014, 10:22 AM

## 2014-06-10 NOTE — Progress Notes (Signed)
Pt ambulates with staff and family with steady gait with brace on and aligned. Neuro intact.  Pt educated on precautions and will notify staff when she wants to ambulate.   Pt denies pain or discomfort. Will continue to monitor.

## 2014-06-10 NOTE — Progress Notes (Signed)
Patient ID: Terri Harris, female   DOB: 1940-08-16, 74 y.o.   MRN: [id number] Patient started having pain severely about 5 AM. No cyst that she also had some nausea and vomiting. Motor function remained stable Back incisions remain clean and dry Will add some Decadron today in addition to some Toradol to see if we can bring pain under control Continues to do well otherwise Hold off discharge for today

## 2014-06-11 MED ORDER — OXYCODONE-ACETAMINOPHEN 5-325 MG PO TABS: 1.0000 | ORAL_TABLET | ORAL | Status: DC | PRN

## 2014-06-11 MED ORDER — DEXAMETHASONE 1 MG PO TABS: ORAL_TABLET | ORAL | Status: DC

## 2014-06-11 MED ORDER — DIAZEPAM 5 MG PO TABS: 5.0000 mg | ORAL_TABLET | Freq: Four times a day (QID) | ORAL | Status: DC | PRN

## 2014-06-11 NOTE — Discharge Summary (Signed)
Physician Discharge Summary  Patient ID: Terri Harris MRN: [id number] DOB/AGE: 1941-04-02 74 y.o.  Admit date: 06/08/2014 Discharge date: 06/11/2014  Admission Diagnoses: Lumbar spondylosis, scoliosis, stenosis L2-L5  Discharge Diagnoses: Lumbar spondylosis, scoliosis and stenosis with lumbar radiculopathy L2-L5 on the left  Active Problems:   Lumbar stenosis with neurogenic claudication   Discharged Condition: good  Hospital CoPatient was admitted to undergo anterolateral decompression L2-L5 with posterior stabilization. She tolerated surgery well.  Consults: None  Significant Diagnostic Studies: None  Treatments: surgery: Anterolateral decompression L2-3 L3-4 arthrodesis with allograft and X lift spacer posterior stabilization of L2-L5 with pedicle screws   Discharge Exam: Blood pressure 121/63, pulse 81, temperature 98.5 F (36.9 C), temperature source Oral, resp. rate 20, height 5\' 7"  (1.702 m), weight 58.514 kg (129 lb), SpO2 95 %. Incisions are clean and dry, motor function is intact with mild weakness in left iliopsoas  Disposition: 01-Home or Self Care  Discharge Instructions    Call MD for:  redness, tenderness, or signs of infection (pain, swelling, redness, odor or green/yellow discharge around incision site)    Complete by:  As directed      Call MD for:  severe uncontrolled pain    Complete by:  As directed      Call MD for:  temperature >100.4    Complete by:  As directed      Diet - low sodium heart healthy    Complete by:  As directed      Discharge instructions    Complete by:  As directed   Okay to shower. Do not apply salves or appointments to incision. No heavy lifting with the upper extremities greater than 15 pounds. May resume driving when not requiring pain medication and patient feels comfortable with doing so.     Increase activity slowly    Complete by:  As directed             Medication List    TAKE these medications        acetaminophen 500 MG tablet  Commonly known as:  TYLENOL  Take 1,000 mg by mouth every 6 (six) hours as needed. Pain     alendronate 70 MG tablet  Commonly known as:  FOSAMAX  Take 1 tablet (70 mg total) by mouth every 7 (seven) days. Take with a full glass of water on an empty stomach. Sit upright for 30 minutes, and then eat breakfast.     amLODipine 5 MG tablet  Commonly known as:  NORVASC  Take 1 tablet (5 mg total) by mouth daily.     dexamethasone 1 MG tablet  Commonly known as:  DECADRON  2 tablets twice daily for 2 days, one tablet twice daily for 2 days, one tablet daily for 2 days.     diazepam 5 MG tablet  Commonly known as:  VALIUM  Take 1 tablet (5 mg total) by mouth every 6 (six) hours as needed for muscle spasms.     oxyCODONE-acetaminophen 5-325 MG per tablet  Commonly known as:  PERCOCET/ROXICET  Take 1-2 tablets by mouth every 4 (four) hours as needed for moderate pain.         SignedEarleen Newport 06/11/2014, 9:02 AM

## 2014-06-11 NOTE — Progress Notes (Signed)
Physical Therapy Treatment Patient Details Name: Terri Harris MRN: [id number] DOB: 27-Nov-1940 Today's Date: 06/11/2014    History of Present Illness  Patient is a 74 year old individual s/p L2-3 L3-4 L4-5 ANTEROLATERAL DECOMPRESSION/FUSION WITH PERCUTANEOUS PEDICLE SCREWS.    PT Comments    Pt. Thinks she is being d/c'd today. Pt. Would benefit from standing balance training.  Follow Up Recommendations  Home health PT;Supervision/Assistance - 24 hour     Equipment Recommendations  None recommended by PT    Recommendations for Other Services       Precautions / Restrictions Precautions Precautions: Fall;Back Precaution Booklet Issued: No Precaution Comments: Able to verbalize 3/3 back precautions.  Required Braces or Orthoses: Spinal Brace Spinal Brace: Lumbar corset Restrictions Weight Bearing Restrictions: No    Mobility  Bed Mobility Overal bed mobility: Modified Independent Bed Mobility: Rolling;Sidelying to Sit;Sit to Sidelying Rolling: Supervision Sidelying to sit: Supervision Supine to sit: Supervision Sit to supine: Supervision Sit to sidelying: Supervision General bed mobility comments: serial practice on sit/supine x 2 with verbal cues for body placement on bed and hand/elbow placement   Transfers Overall transfer level: Modified independent Equipment used: None Transfers: Sit to/from Stand Sit to Stand: Min guard         General transfer comment: Min guard for safety.   Ambulation/Gait Ambulation/Gait assistance: Supervision Ambulation Distance (Feet): 1200 Feet Assistive device: None Gait Pattern/deviations: Step-through pattern;Trunk flexed   Gait velocity interpretation: at or above normal speed for age/gender     Stairs            Wheelchair Mobility    Modified Rankin (Stroke Patients Only)       Balance Overall balance assessment: Needs assistance Sitting-balance support: Feet supported;No upper extremity  supported Sitting balance-Leahy Scale: Good     Standing balance support: Single extremity supported Standing balance-Leahy Scale: Fair Standing balance comment: Pt. LOB and touching down with elevated leg during SLS; tandem stance was more challenging for her when she closed her eyes Single Leg Stance - Right Leg: 10 Single Leg Stance - Left Leg: 10 Tandem Stance - Right Leg: 5 Tandem Stance - Left Leg: 5            Cognition Arousal/Alertness: Awake/alert Behavior During Therapy: WFL for tasks assessed/performed Overall Cognitive Status: Within Functional Limits for tasks assessed                      Exercises      General Comments        Pertinent Vitals/Pain Pain Assessment: Faces Faces Pain Scale: Hurts a little bit Pain Location: back; pt. states it just hurts when she is laying in the bed Pain Descriptors / Indicators: Aching Pain Intervention(s): Monitored during session    Home Living                      Prior Function            PT Goals (current goals can now be found in the care plan section) Progress towards PT goals: Progressing toward goals    Frequency  Min 5X/week    PT Plan Current plan remains appropriate    Co-evaluation             End of Session Equipment Utilized During Treatment: Back brace Activity Tolerance: Patient tolerated treatment well Patient left: in bed;with call bell/phone within reach;with family/visitor present     Time: 1448-1856 PT Time Calculation (min) (ACUTE ONLY):  19 min  Charges:                       G Codes:      Jodi Geralds, SPTA 06/11/2014, 8:38 AM

## 2014-06-11 NOTE — Progress Notes (Signed)
Pt discharging with daughter and husband at this time. Stable, neuro intact with no noted distress. IV discontinued applied dry dressing. Pt denies pain or discomfort. Discharge instructions and education provided with verbal understanding. Prescriptions given to pt to fill at her pharmacy. Pt took all personal belongings along with brace home.

## 2014-07-02 DIAGNOSIS — M5417 Radiculopathy, lumbosacral region: Secondary | ICD-10-CM | POA: Diagnosis not present

## 2014-07-03 ENCOUNTER — Other Ambulatory Visit (HOSPITAL_COMMUNITY): Payer: Self-pay | Admitting: Neurological Surgery

## 2014-07-03 DIAGNOSIS — M5417 Radiculopathy, lumbosacral region: Secondary | ICD-10-CM

## 2014-07-07 ENCOUNTER — Ambulatory Visit (HOSPITAL_COMMUNITY)
Admission: RE | Admit: 2014-07-07 | Discharge: 2014-07-07 | Disposition: A | Discharge status: Home / Self Care | Payer: Medicare Other | Source: Ambulatory Visit | Attending: Neurological Surgery | Admitting: Neurological Surgery

## 2014-07-07 DIAGNOSIS — M47896 Other spondylosis, lumbar region: Secondary | ICD-10-CM | POA: Diagnosis not present

## 2014-07-07 DIAGNOSIS — S32058D Other fracture of fifth lumbar vertebra, subsequent encounter for fracture with routine healing: Secondary | ICD-10-CM | POA: Diagnosis not present

## 2014-07-07 DIAGNOSIS — M5417 Radiculopathy, lumbosacral region: Secondary | ICD-10-CM | POA: Diagnosis not present

## 2014-07-07 DIAGNOSIS — M47897 Other spondylosis, lumbosacral region: Secondary | ICD-10-CM | POA: Diagnosis not present

## 2014-07-07 DIAGNOSIS — Z981 Arthrodesis status: Secondary | ICD-10-CM | POA: Diagnosis not present

## 2014-07-10 ENCOUNTER — Telehealth: Payer: Self-pay | Admitting: Family Medicine

## 2014-07-10 NOTE — Telephone Encounter (Signed)
Ntsw, ov mon morn

## 2014-07-10 NOTE — Telephone Encounter (Signed)
Pt's husband called stating that she is having problems with her Back. Pt had surgery in January and is experiencing pain with it. Pt has been back to surgery about this and isn't getting anywhere. Husband wants Dr. Nicki Reaper to see her and look at the CT that was recently Done to see what he thinks. Husband is wanting her to be seen first thing  Monday morning about this.

## 2014-07-10 NOTE — Telephone Encounter (Signed)
Office visit scheduled for Monday for follow up on back pain.

## 2014-07-13 ENCOUNTER — Ambulatory Visit: Payer: Medicare Other | Admitting: Family Medicine

## 2014-07-14 ENCOUNTER — Ambulatory Visit (INDEPENDENT_AMBULATORY_CARE_PROVIDER_SITE_OTHER): Payer: Medicare Other | Admitting: Family Medicine

## 2014-07-14 ENCOUNTER — Encounter: Payer: Self-pay | Admitting: Family Medicine

## 2014-07-14 VITALS — BP 148/82 | Ht 67.0 in

## 2014-07-14 DIAGNOSIS — M5441 Lumbago with sciatica, right side: Secondary | ICD-10-CM | POA: Diagnosis not present

## 2014-07-14 MED ORDER — OXYCODONE-ACETAMINOPHEN 10-325 MG PO TABS: 1.0000 | ORAL_TABLET | ORAL | Status: DC | PRN

## 2014-07-14 MED ORDER — GABAPENTIN 100 MG PO CAPS: 100.0000 mg | ORAL_CAPSULE | Freq: Three times a day (TID) | ORAL | Status: DC

## 2014-07-14 NOTE — Patient Instructions (Signed)
Gabapentin start at 100 mg each evening for the next 3 nights Then one 2 times a day for the next 4 days Then one 3 times a day

## 2014-07-14 NOTE — Progress Notes (Signed)
   Subjective:    Patient ID: Terri Harris, female    DOB: 02/16/1941, 74 y.o.   MRN: [id number]  HPI Patient arrives for a follow up on back pin and to get results from CT scan. Patient has follow up with Dr Ellene Route March 16th but states she is not going back. Over 30 minutes was spent talking with the patient about her surgery the pain she is been having plus how she had a CAT scan and has not heard anything on the results  I reviewed over the CAT scan results I did tell her that I was not neurosurgeon I did not see any sign of abscess or serious problem but I did see how there was one area on L5 that could be contributing to the pain I told the patient I thought that this will gradually get better I showed her the images I did encourage her to talk with her neurosurgeon about this  Review of Systems  Constitutional: Negative for activity change and appetite change.  Gastrointestinal: Negative for vomiting and abdominal pain.  Neurological: Negative for weakness.  Psychiatric/Behavioral: Negative for confusion.       Objective:   Physical Exam  Constitutional: She appears well-nourished. No distress.  HENT:  Head: Normocephalic.  Cardiovascular: Normal rate, regular rhythm and normal heart sounds.   No murmur heard. Pulmonary/Chest: Effort normal and breath sounds normal.  Musculoskeletal: She exhibits no edema.  Lymphadenopathy:    She has no cervical adenopathy.  Neurological: She is alert.  Psychiatric: Her behavior is normal.  Vitals reviewed.   Moderate to severe low back pain surgical scars noted no sign of infection she relates the pain worse on the right side radiating into the right buttock and the top of the thigh  Greater than 25 minutes spent with patient discussing her back pain recent surgeries treatment options in discussion of questions. [id number]    Assessment & Plan:  Lumbar pain along with radiation into right leg-gabapentin gradually increase it to 100 mg 3  times a day follow-up in a few weeks.  Severe pain related to surgery oxycodone 10 mg/325 mg, one every 4 hours when necessary severe pain caution drowsiness  CTs scan was reviewed to patient she was encouraged to see her specialists and review it with him as well. Patient was very dissatisfied with how they handled at his office she may consider going to a different neurosurgeon she will let us now  I did talk with her neurosurgeon late Monday afternoon. He stated that he will be following up the patient in several weeks said he did speak with patient today. I called the patient left a message

## 2014-08-10 ENCOUNTER — Ambulatory Visit (INDEPENDENT_AMBULATORY_CARE_PROVIDER_SITE_OTHER): Payer: Medicare Other | Admitting: Family Medicine

## 2014-08-10 ENCOUNTER — Encounter: Payer: Self-pay | Admitting: Family Medicine

## 2014-08-10 VITALS — BP 132/84 | Ht 67.0 in | Wt 129.6 lb

## 2014-08-10 DIAGNOSIS — M5441 Lumbago with sciatica, right side: Secondary | ICD-10-CM

## 2014-08-10 MED ORDER — OXYCODONE-ACETAMINOPHEN 10-325 MG PO TABS: 1.0000 | ORAL_TABLET | ORAL | Status: DC | PRN

## 2014-08-10 NOTE — Progress Notes (Signed)
   Subjective:    Patient ID: Terri Terri, female    DOB: 03-23-1941, 74 y.o.   MRN: [id number]  HPI  Patient arrives for follow up on back pain. Patient states she has follow up with neurosurgeon tomorrow. Patient states she is still having pain in her back and swelling in right foot.   Review of Systems  Constitutional: Negative for activity change, appetite change and fatigue.  HENT: Negative for congestion.   Respiratory: Negative for cough.   Cardiovascular: Negative for chest pain.  Gastrointestinal: Negative for abdominal pain.  Endocrine: Negative for polydipsia and polyphagia.  Neurological: Negative for weakness.  Psychiatric/Behavioral: Negative for confusion.       Objective:   Physical Exam  Constitutional: She appears well-nourished. No distress.  Cardiovascular: Normal rate, regular rhythm and normal heart sounds.   No murmur heard. Pulmonary/Chest: Effort normal and breath sounds normal. No respiratory distress.  Musculoskeletal: She exhibits no edema.  Lymphadenopathy:    She has no cervical adenopathy.  Neurological: She is alert. She exhibits normal muscle tone.  Psychiatric: Her behavior is normal.  Vitals reviewed. subjective lower back pain and mild right leg sciativca bp good 138 78   There is slight swelling in the right foot I doubt this is due to her blood pressure medicine because the left side does not have any swelling. There is no sign of infection.     Assessment & Plan:  Lipid recheck in summer Lumbar pain stable refill on oxycodone, avoid excessive use Sees neurosurgery on Tuesday  The patient is going to follow-up in the summertime. If she starts finding that she is having use he oxycodone on a regular basis and next step would be is for her to sign a pain management contract in to be placed on monthly supply with every 3 month visits.

## 2014-08-13 ENCOUNTER — Other Ambulatory Visit (HOSPITAL_COMMUNITY)
Admission: RE | Admit: 2014-08-13 | Discharge: 2014-08-13 | Disposition: A | Discharge status: Home / Self Care | Payer: Medicare Other | Source: Ambulatory Visit | Attending: Neurological Surgery | Admitting: Neurological Surgery

## 2014-08-13 DIAGNOSIS — M5417 Radiculopathy, lumbosacral region: Secondary | ICD-10-CM | POA: Diagnosis not present

## 2014-08-13 LAB — C-REACTIVE PROTEIN: CRP: 5.4 mg/dL — ABNORMAL HIGH (ref <0.60)

## 2014-08-13 LAB — SEDIMENTATION RATE: Sed Rate: 20 mm/hr (ref 0–22)

## 2014-08-17 DIAGNOSIS — M47816 Spondylosis without myelopathy or radiculopathy, lumbar region: Secondary | ICD-10-CM | POA: Diagnosis not present

## 2014-08-17 DIAGNOSIS — M5416 Radiculopathy, lumbar region: Secondary | ICD-10-CM | POA: Diagnosis not present

## 2014-09-03 ENCOUNTER — Ambulatory Visit (INDEPENDENT_AMBULATORY_CARE_PROVIDER_SITE_OTHER): Payer: Medicare Other | Admitting: Family Medicine

## 2014-09-03 ENCOUNTER — Encounter: Payer: Self-pay | Admitting: Family Medicine

## 2014-09-03 VITALS — BP 140/80 | Temp 97.5°F | Ht 67.0 in | Wt 124.0 lb

## 2014-09-03 DIAGNOSIS — R1010 Upper abdominal pain, unspecified: Secondary | ICD-10-CM | POA: Diagnosis not present

## 2014-09-03 DIAGNOSIS — R634 Abnormal weight loss: Secondary | ICD-10-CM | POA: Diagnosis not present

## 2014-09-03 DIAGNOSIS — K219 Gastro-esophageal reflux disease without esophagitis: Secondary | ICD-10-CM

## 2014-09-03 MED ORDER — LORAZEPAM 0.5 MG PO TABS: 0.5000 mg | ORAL_TABLET | Freq: Two times a day (BID) | ORAL | Status: DC | PRN

## 2014-09-03 MED ORDER — PANTOPRAZOLE SODIUM 40 MG PO TBEC: 40.0000 mg | DELAYED_RELEASE_TABLET | Freq: Every day | ORAL | Status: DC

## 2014-09-03 MED ORDER — ONDANSETRON 8 MG PO TBDP: 8.0000 mg | ORAL_TABLET | Freq: Three times a day (TID) | ORAL | Status: DC | PRN

## 2014-09-03 NOTE — Progress Notes (Signed)
   Subjective:    Patient ID: Terri Harris, female    DOB: 1941/02/23, 74 y.o.   MRN: [id number]  HPI Patient is here today for nausea that has been present for about 6-7 months. Patient states that she has vomited about 2 times. Patient has a lot of belching episodes also. Patient has a lot of belching episodes also.  Patient rely relates a lot of epigastric nausea discomfort intermittent heartburn a lot of belching. Also having difficulty sleeping under a fair amount of stress tries to eat properly has been in a lot of pain because of her back  Review of Systems  Constitutional: Negative for activity change and appetite change.  Gastrointestinal: Positive for nausea and abdominal pain. Negative for vomiting, constipation and blood in stool.  Neurological: Negative for weakness.  Psychiatric/Behavioral: Negative for confusion.       Objective:   Physical Exam  Constitutional: She appears well-developed.  Cardiovascular: Normal rate, regular rhythm and normal heart sounds.   No murmur heard. Pulmonary/Chest: Effort normal and breath sounds normal.  Abdominal: Soft. She exhibits no distension. There is no tenderness.  Neurological: She is alert.  Skin: Skin is warm and dry.    25 to 30 minutes spent with patient discussing these multiple issues      Assessment & Plan:  Probable reflux issues. Proton pump inhibitor Stop Fosamax this could be contributed eating Zofran for nausea Lab work ordered If persistent symptoms ultrasound and HIDA If ongoing symptoms after that EGD Monitor weight closely Patient to call back sooner if worse Follow-up office visit 2 weeks  insomnia-mild nerve pill given for nighttime use to help with sleep follow-up 2 weeks patient denies being depressed she was educated how to safely take this. Patient states she does not drink alcohol at night. Rarely uses alcohol.

## 2014-09-03 NOTE — Patient Instructions (Signed)
Stop Fosamax  Use protonix daily  zofran as needed for nausea  Do heme cards as directed  Recheck here in 2 weeks sooner if problems  Very important to follow up

## 2014-09-04 DIAGNOSIS — R1010 Upper abdominal pain, unspecified: Secondary | ICD-10-CM | POA: Diagnosis not present

## 2014-09-04 DIAGNOSIS — R634 Abnormal weight loss: Secondary | ICD-10-CM | POA: Diagnosis not present

## 2014-09-05 LAB — BASIC METABOLIC PANEL
BUN/Creatinine Ratio: 15 (ref 11–26)
BUN: 11 mg/dL (ref 8–27)
CO2: 22 mmol/L (ref 18–29)
Calcium: 9.8 mg/dL (ref 8.7–10.3)
Chloride: 102 mmol/L (ref 97–108)
Creatinine, Ser: 0.72 mg/dL (ref 0.57–1.00)
GFR calc Af Amer: 97 mL/min/{1.73_m2} (ref >59)
GFR calc non Af Amer: 84 mL/min/{1.73_m2} (ref >59)
Glucose: 91 mg/dL (ref 65–99)
Potassium: 4.1 mmol/L (ref 3.5–5.2)
Sodium: 143 mmol/L (ref 134–144)

## 2014-09-05 LAB — HEPATIC FUNCTION PANEL
ALT: 8 IU/L (ref 0–32)
AST: 15 IU/L (ref 0–40)
Albumin: 5.1 g/dL — ABNORMAL HIGH (ref 3.5–4.8)
Alkaline Phosphatase: 85 IU/L (ref 39–117)
Bilirubin Total: 0.6 mg/dL (ref 0.0–1.2)
Bilirubin, Direct: 0.15 mg/dL (ref 0.00–0.40)
Total Protein: 7.6 g/dL (ref 6.0–8.5)

## 2014-09-05 LAB — CBC WITH DIFFERENTIAL/PLATELET
Basophils Absolute: 0.1 10*3/uL (ref 0.0–0.2)
Basos: 1 %
Eos: 1 %
Eosinophils Absolute: 0.1 10*3/uL (ref 0.0–0.4)
HCT: 47.1 % — ABNORMAL HIGH (ref 34.0–46.6)
Hemoglobin: 15.8 g/dL (ref 11.1–15.9)
Immature Grans (Abs): 0 10*3/uL (ref 0.0–0.1)
Immature Granulocytes: 0 %
Lymphocytes Absolute: 1 10*3/uL (ref 0.7–3.1)
Lymphs: 14 %
MCH: 31.5 pg (ref 26.6–33.0)
MCHC: 33.5 g/dL (ref 31.5–35.7)
MCV: 94 fL (ref 79–97)
Monocytes Absolute: 0.6 10*3/uL (ref 0.1–0.9)
Monocytes: 8 %
Neutrophils Absolute: 5.3 10*3/uL (ref 1.4–7.0)
Neutrophils Relative %: 76 %
Platelets: 272 10*3/uL (ref 150–379)
RBC: 5.01 x10E6/uL (ref 3.77–5.28)
RDW: 14.6 % (ref 12.3–15.4)
WBC: 7 10*3/uL (ref 3.4–10.8)

## 2014-09-05 LAB — LIPASE: Lipase: 29 U/L (ref 0–59)

## 2014-09-08 LAB — POC HEMOCCULT BLD/STL (HOME/3-CARD/SCREEN)
Card #2 Fecal Occult Blod, POC: NEGATIVE
Card #3 Fecal Occult Blood, POC: NEGATIVE
Fecal Occult Blood, POC: NEGATIVE

## 2014-09-10 DIAGNOSIS — M5416 Radiculopathy, lumbar region: Secondary | ICD-10-CM | POA: Diagnosis not present

## 2014-09-10 DIAGNOSIS — M5417 Radiculopathy, lumbosacral region: Secondary | ICD-10-CM | POA: Diagnosis not present

## 2014-09-16 ENCOUNTER — Ambulatory Visit (INDEPENDENT_AMBULATORY_CARE_PROVIDER_SITE_OTHER): Payer: Medicare Other | Admitting: Family Medicine

## 2014-09-16 ENCOUNTER — Encounter: Payer: Self-pay | Admitting: Family Medicine

## 2014-09-16 VITALS — BP 138/76 | Ht 67.0 in | Wt 126.0 lb

## 2014-09-16 DIAGNOSIS — K2970 Gastritis, unspecified, without bleeding: Secondary | ICD-10-CM | POA: Diagnosis not present

## 2014-09-16 DIAGNOSIS — K2990 Gastroduodenitis, unspecified, without bleeding: Secondary | ICD-10-CM | POA: Diagnosis not present

## 2014-09-16 MED ORDER — AMLODIPINE BESYLATE 5 MG PO TABS: 5.0000 mg | ORAL_TABLET | Freq: Every day | ORAL | Status: DC

## 2014-09-16 MED ORDER — PANTOPRAZOLE SODIUM 40 MG PO TBEC: 40.0000 mg | DELAYED_RELEASE_TABLET | Freq: Every day | ORAL | Status: DC

## 2014-09-16 NOTE — Progress Notes (Signed)
   Subjective:    Patient ID: Terri Harris, female    DOB: 1941/03/06, 74 y.o.   MRN: [id number]  HPIFollow up on abd pain. Pt states pain is gone. Still having some nausea. Taking protonix 40mg  one daily and zofran 8mg .  Pt states no other concerns today.  Patient still has some nausea some belching denies right upper quadrant pain or tenderness see previous note   Review of Systems Lungs clear without any type denies vomiting denies abdominal pain    Objective:   Physical Exam Lungs clear heart regular abdomen soft no guarding rebound or tenderness       Assessment & Plan:  Gastritis-this seems to be resolving she still having a lot of belching and occasional reflux symptoms she denies any abdominal pain denies loss of appetite denies losing weight states medicine is helping her a lot I recommend she follow-up later this summer. If patient starts having increased pain weight loss regimen she is notified as soon I don't feel the patient needs EGD currently.

## 2014-12-02 ENCOUNTER — Telehealth: Payer: Self-pay | Admitting: Family Medicine

## 2014-12-02 DIAGNOSIS — E785 Hyperlipidemia, unspecified: Secondary | ICD-10-CM

## 2014-12-02 DIAGNOSIS — I10 Essential (primary) hypertension: Secondary | ICD-10-CM

## 2014-12-02 DIAGNOSIS — M810 Age-related osteoporosis without current pathological fracture: Secondary | ICD-10-CM

## 2014-12-02 NOTE — Telephone Encounter (Signed)
Blood work orders placed in [facility]. Patient notified.

## 2014-12-02 NOTE — Telephone Encounter (Signed)
Pt is requesting lab orders to be sent over. Last labs per epic were poc 09/08/14 ,cbc,bmp,lipase,and hepatic 09/04/14

## 2014-12-02 NOTE — Telephone Encounter (Signed)
Lipid,met 7, vit D

## 2014-12-09 ENCOUNTER — Ambulatory Visit (INDEPENDENT_AMBULATORY_CARE_PROVIDER_SITE_OTHER): Payer: Medicare Other | Admitting: Family Medicine

## 2014-12-09 ENCOUNTER — Encounter: Payer: Self-pay | Admitting: Family Medicine

## 2014-12-09 VITALS — BP 126/80 | Temp 97.8°F | Ht 67.0 in | Wt 128.5 lb

## 2014-12-09 DIAGNOSIS — J069 Acute upper respiratory infection, unspecified: Secondary | ICD-10-CM | POA: Diagnosis not present

## 2014-12-09 DIAGNOSIS — I10 Essential (primary) hypertension: Secondary | ICD-10-CM | POA: Diagnosis not present

## 2014-12-09 MED ORDER — OXYCODONE-ACETAMINOPHEN 5-325 MG PO TABS: 1.0000 | ORAL_TABLET | ORAL | Status: DC | PRN

## 2014-12-09 NOTE — Patient Instructions (Signed)
DASH Eating Plan °DASH stands for "Dietary Approaches to Stop Hypertension." The DASH eating plan is a healthy eating plan that has been shown to reduce high blood pressure (hypertension). Additional health benefits may include reducing the risk of type 2 diabetes mellitus, heart disease, and stroke. The DASH eating plan may also help with weight loss. °WHAT DO I NEED TO KNOW ABOUT THE DASH EATING PLAN? °For the DASH eating plan, you will follow these general guidelines: °· Choose foods with a percent daily value for sodium of less than 5% (as listed on the food label). °· Use salt-free seasonings or herbs instead of table salt or sea salt. °· Check with your health care provider or pharmacist before using salt substitutes. °· Eat lower-sodium products, often labeled as "lower sodium" or "no salt added." °· Eat fresh foods. °· Eat more vegetables, fruits, and low-fat dairy products. °· Choose whole grains. Look for the word "whole" as the first word in the ingredient list. °· Choose fish and skinless chicken or turkey more often than red meat. Limit fish, poultry, and meat to 6 oz (170 g) each day. °· Limit sweets, desserts, sugars, and sugary drinks. °· Choose heart-healthy fats. °· Limit cheese to 1 oz (28 g) per day. °· Eat more home-cooked food and less restaurant, buffet, and fast food. °· Limit fried foods. °· Cook foods using methods other than frying. °· Limit canned vegetables. If you do use them, rinse them well to decrease the sodium. °· When eating at a restaurant, ask that your food be prepared with less salt, or no salt if possible. °WHAT FOODS CAN I EAT? °Seek help from a dietitian for individual calorie needs. °Grains °Whole grain or whole wheat bread. Brown rice. Whole grain or whole wheat pasta. Quinoa, bulgur, and whole grain cereals. Low-sodium cereals. Corn or whole wheat flour tortillas. Whole grain cornbread. Whole grain crackers. Low-sodium crackers. °Vegetables °Fresh or frozen vegetables  (raw, steamed, roasted, or grilled). Low-sodium or reduced-sodium tomato and vegetable juices. Low-sodium or reduced-sodium tomato sauce and paste. Low-sodium or reduced-sodium canned vegetables.  °Fruits °All fresh, canned (in natural juice), or frozen fruits. °Meat and Other Protein Products °Ground beef (85% or leaner), grass-fed beef, or beef trimmed of fat. Skinless chicken or turkey. Ground chicken or turkey. Pork trimmed of fat. All fish and seafood. Eggs. Dried beans, peas, or lentils. Unsalted nuts and seeds. Unsalted canned beans. °Dairy °Low-fat dairy products, such as skim or 1% milk, 2% or reduced-fat cheeses, low-fat ricotta or cottage cheese, or plain low-fat yogurt. Low-sodium or reduced-sodium cheeses. °Fats and Oils °Tub margarines without trans fats. Light or reduced-fat mayonnaise and salad dressings (reduced sodium). Avocado. Safflower, olive, or canola oils. Natural peanut or almond butter. °Other °Unsalted popcorn and pretzels. °The items listed above may not be a complete list of recommended foods or beverages. Contact your dietitian for more options. °WHAT FOODS ARE NOT RECOMMENDED? °Grains °White bread. White pasta. White rice. Refined cornbread. Bagels and croissants. Crackers that contain trans fat. °Vegetables °Creamed or fried vegetables. Vegetables in a cheese sauce. Regular canned vegetables. Regular canned tomato sauce and paste. Regular tomato and vegetable juices. °Fruits °Dried fruits. Canned fruit in light or heavy syrup. Fruit juice. °Meat and Other Protein Products °Fatty cuts of meat. Ribs, chicken wings, bacon, sausage, bologna, salami, chitterlings, fatback, hot dogs, bratwurst, and packaged luncheon meats. Salted nuts and seeds. Canned beans with salt. °Dairy °Whole or 2% milk, cream, half-and-half, and cream cheese. Whole-fat or sweetened yogurt. Full-fat   cheeses or blue cheese. Nondairy creamers and whipped toppings. Processed cheese, cheese spreads, or cheese  curds. °Condiments °Onion and garlic salt, seasoned salt, table salt, and sea salt. Canned and packaged gravies. Worcestershire sauce. Tartar sauce. Barbecue sauce. Teriyaki sauce. Soy sauce, including reduced sodium. Steak sauce. Fish sauce. Oyster sauce. Cocktail sauce. Horseradish. Ketchup and mustard. Meat flavorings and tenderizers. Bouillon cubes. Hot sauce. Tabasco sauce. Marinades. Taco seasonings. Relishes. °Fats and Oils °Butter, stick margarine, lard, shortening, ghee, and bacon fat. Coconut, palm kernel, or palm oils. Regular salad dressings. °Other °Pickles and olives. Salted popcorn and pretzels. °The items listed above may not be a complete list of foods and beverages to avoid. Contact your dietitian for more information. °WHERE CAN I FIND MORE INFORMATION? °National Heart, Lung, and Blood Institute: www.nhlbi.nih.gov/health/health-topics/topics/dash/ °Document Released: 05/04/2011 Document Revised: 09/29/2013 Document Reviewed: 03/19/2013 °ExitCare® Patient Information ©2015 [employer]. This information is not intended to replace advice given to you by your health care provider. Make sure you discuss any questions you have with your health care provider. ° °

## 2014-12-09 NOTE — Progress Notes (Signed)
   Subjective:    Patient ID: Terri Harris, female    DOB: 1941-05-29, 74 y.o.   MRN: [id number]  Hypertension This is a chronic problem. The current episode started more than 1 year ago. There are no compliance problems.    she takes her medicine she does try to watch her diet  Patient has c/o sore throat with an onset of 3 days ago. Patient states no other concerns this visit. She relates a little bit of congestion drainage no wheezing or difficulty breathing  Review of Systems She denies chest tightness pressure pain shortness breath nausea vomiting diarrhea    Objective:   Physical Exam  Lungs clear hearts regular pulse normal skin warm dry neurologic gross normal      Assessment & Plan:  I believe that this is more viral illness no antibiotics necessary call back if problems  Continue current antibiotics  Patient rarely uses pain medicine for her back she was given a prescription  She should follow-up within 6 months

## 2014-12-10 DIAGNOSIS — I10 Essential (primary) hypertension: Secondary | ICD-10-CM | POA: Diagnosis not present

## 2014-12-10 DIAGNOSIS — M5416 Radiculopathy, lumbar region: Secondary | ICD-10-CM | POA: Diagnosis not present

## 2014-12-25 DIAGNOSIS — M5417 Radiculopathy, lumbosacral region: Secondary | ICD-10-CM | POA: Diagnosis not present

## 2014-12-27 DIAGNOSIS — I700 Atherosclerosis of aorta: Secondary | ICD-10-CM | POA: Diagnosis not present

## 2014-12-27 DIAGNOSIS — Z8639 Personal history of other endocrine, nutritional and metabolic disease: Secondary | ICD-10-CM | POA: Insufficient documentation

## 2014-12-27 DIAGNOSIS — Z8701 Personal history of pneumonia (recurrent): Secondary | ICD-10-CM | POA: Diagnosis not present

## 2014-12-27 DIAGNOSIS — I10 Essential (primary) hypertension: Secondary | ICD-10-CM | POA: Diagnosis not present

## 2014-12-27 DIAGNOSIS — M545 Low back pain: Secondary | ICD-10-CM | POA: Diagnosis present

## 2014-12-27 DIAGNOSIS — Z87891 Personal history of nicotine dependence: Secondary | ICD-10-CM | POA: Insufficient documentation

## 2014-12-27 DIAGNOSIS — G8929 Other chronic pain: Secondary | ICD-10-CM | POA: Insufficient documentation

## 2014-12-27 DIAGNOSIS — Z79899 Other long term (current) drug therapy: Secondary | ICD-10-CM | POA: Diagnosis not present

## 2014-12-27 DIAGNOSIS — Z8542 Personal history of malignant neoplasm of other parts of uterus: Secondary | ICD-10-CM | POA: Insufficient documentation

## 2014-12-27 DIAGNOSIS — R198 Other specified symptoms and signs involving the digestive system and abdomen: Secondary | ICD-10-CM | POA: Diagnosis not present

## 2014-12-27 DIAGNOSIS — K219 Gastro-esophageal reflux disease without esophagitis: Secondary | ICD-10-CM | POA: Diagnosis not present

## 2014-12-27 DIAGNOSIS — M5441 Lumbago with sciatica, right side: Secondary | ICD-10-CM | POA: Diagnosis not present

## 2014-12-28 ENCOUNTER — Emergency Department (HOSPITAL_COMMUNITY)
Admission: EM | Admit: 2014-12-28 | Discharge: 2014-12-28 | Disposition: A | Discharge status: Home / Self Care | Payer: Medicare Other | Attending: Emergency Medicine | Admitting: Emergency Medicine

## 2014-12-28 ENCOUNTER — Emergency Department (HOSPITAL_COMMUNITY): Payer: Medicare Other

## 2014-12-28 ENCOUNTER — Encounter (HOSPITAL_COMMUNITY): Payer: Self-pay | Admitting: Emergency Medicine

## 2014-12-28 DIAGNOSIS — R198 Other specified symptoms and signs involving the digestive system and abdomen: Secondary | ICD-10-CM | POA: Diagnosis not present

## 2014-12-28 DIAGNOSIS — M5441 Lumbago with sciatica, right side: Secondary | ICD-10-CM

## 2014-12-28 DIAGNOSIS — I700 Atherosclerosis of aorta: Secondary | ICD-10-CM | POA: Diagnosis not present

## 2014-12-28 LAB — URINALYSIS, ROUTINE W REFLEX MICROSCOPIC
Bilirubin Urine: NEGATIVE
Glucose, UA: NEGATIVE mg/dL
Hgb urine dipstick: NEGATIVE
Ketones, ur: NEGATIVE mg/dL
Nitrite: NEGATIVE
Protein, ur: NEGATIVE mg/dL
Specific Gravity, Urine: 1.007 (ref 1.005–1.030)
Urobilinogen, UA: 0.2 mg/dL (ref 0.0–1.0)
pH: 7 (ref 5.0–8.0)

## 2014-12-28 LAB — COMPREHENSIVE METABOLIC PANEL
ALT: 12 U/L — ABNORMAL LOW (ref 14–54)
AST: 34 U/L (ref 15–41)
Albumin: 4.1 g/dL (ref 3.5–5.0)
Alkaline Phosphatase: 73 U/L (ref 38–126)
Anion gap: 5 (ref 5–15)
BUN: 13 mg/dL (ref 6–20)
CO2: 29 mmol/L (ref 22–32)
Calcium: 9.2 mg/dL (ref 8.9–10.3)
Chloride: 103 mmol/L (ref 101–111)
Creatinine, Ser: 0.56 mg/dL (ref 0.44–1.00)
GFR calc Af Amer: >60 mL/min (ref >60)
GFR calc non Af Amer: >60 mL/min (ref >60)
Glucose, Bld: 100 mg/dL — ABNORMAL HIGH (ref 65–99)
Potassium: 5.3 mmol/L — ABNORMAL HIGH (ref 3.5–5.1)
Sodium: 137 mmol/L (ref 135–145)
Total Bilirubin: 0.9 mg/dL (ref 0.3–1.2)
Total Protein: 7.1 g/dL (ref 6.5–8.1)

## 2014-12-28 LAB — CBC WITH DIFFERENTIAL/PLATELET
Basophils Absolute: 0.1 10*3/uL (ref 0.0–0.1)
Basophils Relative: 1 % (ref 0–1)
Eosinophils Absolute: 0.2 10*3/uL (ref 0.0–0.7)
Eosinophils Relative: 4 % (ref 0–5)
HCT: 42.3 % (ref 36.0–46.0)
Hemoglobin: 13.8 g/dL (ref 12.0–15.0)
Lymphocytes Relative: 25 % (ref 12–46)
Lymphs Abs: 1.2 10*3/uL (ref 0.7–4.0)
MCH: 32.1 pg (ref 26.0–34.0)
MCHC: 32.6 g/dL (ref 30.0–36.0)
MCV: 98.4 fL (ref 78.0–100.0)
Monocytes Absolute: 0.6 10*3/uL (ref 0.1–1.0)
Monocytes Relative: 12 % (ref 3–12)
Neutro Abs: 2.8 10*3/uL (ref 1.7–7.7)
Neutrophils Relative %: 58 % (ref 43–77)
Platelets: 219 10*3/uL (ref 150–400)
RBC: 4.3 MIL/uL (ref 3.87–5.11)
RDW: 13.3 % (ref 11.5–15.5)
WBC: 4.9 10*3/uL (ref 4.0–10.5)

## 2014-12-28 LAB — URINE MICROSCOPIC-ADD ON

## 2014-12-28 MED ORDER — DIAZEPAM 5 MG PO TABS: 5.0000 mg | ORAL_TABLET | Freq: Four times a day (QID) | ORAL | Status: DC | PRN

## 2014-12-28 MED ORDER — HYDROMORPHONE HCL 1 MG/ML IJ SOLN
1.0000 mg | Freq: Once | INTRAMUSCULAR | Status: AC
  Administered 2014-12-28: 1 mg via INTRAVENOUS
  Filled 2014-12-28: qty 1

## 2014-12-28 MED ORDER — HYDROMORPHONE HCL 1 MG/ML IJ SOLN
INTRAMUSCULAR | Status: DC
Start: 2014-12-28 — End: 2014-12-28
  Filled 2014-12-28: qty 1

## 2014-12-28 MED ORDER — OXYCODONE-ACETAMINOPHEN 5-325 MG PO TABS: 2.0000 | ORAL_TABLET | ORAL | Status: DC | PRN

## 2014-12-28 MED ORDER — IOHEXOL 300 MG/ML  SOLN
25.0000 mL | Freq: Once | INTRAMUSCULAR | Status: AC | PRN
  Administered 2014-12-28: 25 mL via ORAL

## 2014-12-28 MED ORDER — IOHEXOL 300 MG/ML  SOLN
80.0000 mL | Freq: Once | INTRAMUSCULAR | Status: AC | PRN
  Administered 2014-12-28: 100 mL via INTRAVENOUS

## 2014-12-28 MED ORDER — HYDROMORPHONE HCL 1 MG/ML IJ SOLN
1.0000 mg | Freq: Once | INTRAMUSCULAR | Status: AC
  Administered 2014-12-28: 1 mg via INTRAVENOUS

## 2014-12-28 MED ORDER — ONDANSETRON HCL 4 MG/2ML IJ SOLN
INTRAMUSCULAR | Status: AC
  Filled 2014-12-28: qty 2

## 2014-12-28 MED ORDER — ONDANSETRON HCL 4 MG/2ML IJ SOLN
4.0000 mg | Freq: Once | INTRAMUSCULAR | Status: AC
  Administered 2014-12-28: 4 mg via INTRAVENOUS

## 2014-12-28 NOTE — ED Notes (Signed)
Pt. reports low back pain radiating to right leg onset last week , denies injury or fall , ambulatory , no dysuria or hematuria . Pain increases with movement and changing positions while lying on bed.

## 2014-12-28 NOTE — ED Notes (Signed)
Ct informed pt finished drinking contrast

## 2014-12-28 NOTE — ED Notes (Signed)
Pt ambulatory with steady gait to restroom 

## 2014-12-28 NOTE — Discharge Instructions (Signed)
Back Pain, Adult Low back pain is very common. About 1 in 5 people have back pain.The cause of low back pain is rarely dangerous. The pain often gets better over time.About half of people with a sudden onset of back pain feel better in just 2 weeks. About 8 in 10 people feel better by 6 weeks.  CAUSES Some common causes of back pain include:  Strain of the muscles or ligaments supporting the spine.  Wear and tear (degeneration) of the spinal discs.  Arthritis.  Direct injury to the back. DIAGNOSIS Most of the time, the direct cause of low back pain is not known.However, back pain can be treated effectively even when the exact cause of the pain is unknown.Answering your caregiver's questions about your overall health and symptoms is one of the most accurate ways to make sure the cause of your pain is not dangerous. If your caregiver needs more information, he or she may order lab work or imaging tests (X-rays or MRIs).However, even if imaging tests show changes in your back, this usually does not require surgery. HOME CARE INSTRUCTIONS For many people, back pain returns.Since low back pain is rarely dangerous, it is often a condition that people can learn to manageon their own.   Remain active. It is stressful on the back to sit or stand in one place. Do not sit, drive, or stand in one place for more than 30 minutes at a time. Take short walks on level surfaces as soon as pain allows.Try to increase the length of time you walk each day.  Do not stay in bed.Resting more than 1 or 2 days can delay your recovery.  Do not avoid exercise or work.Your body is made to move.It is not dangerous to be active, even though your back may hurt.Your back will likely heal faster if you return to being active before your pain is gone.  Pay attention to your body when you bend and lift. Many people have less discomfortwhen lifting if they bend their knees, keep the load close to their bodies,and  avoid twisting. Often, the most comfortable positions are those that put less stress on your recovering back.  Find a comfortable position to sleep. Use a firm mattress and lie on your side with your knees slightly bent. If you lie on your back, put a pillow under your knees.  Only take over-the-counter or prescription medicines as directed by your caregiver. Over-the-counter medicines to reduce pain and inflammation are often the most helpful.Your caregiver may prescribe muscle relaxant drugs.These medicines help dull your pain so you can more quickly return to your normal activities and healthy exercise.  Put ice on the injured area.  Put ice in a plastic bag.  Place a towel between your skin and the bag.  Leave the ice on for 15-20 minutes, 03-04 times a day for the first 2 to 3 days. After that, ice and heat may be alternated to reduce pain and spasms.  Ask your caregiver about trying back exercises and gentle massage. This may be of some benefit.  Avoid feeling anxious or stressed.Stress increases muscle tension and can worsen back pain.It is important to recognize when you are anxious or stressed and learn ways to manage it.Exercise is a great option. SEEK MEDICAL CARE IF:  You have pain that is not relieved with rest or medicine.  You have pain that does not improve in 1 week.  You have new symptoms.  You are generally not feeling well. SEEK   IMMEDIATE MEDICAL CARE IF:   You have pain that radiates from your back into your legs.  You develop new bowel or bladder control problems.  You have unusual weakness or numbness in your arms or legs.  You develop nausea or vomiting.  You develop abdominal pain.  You feel faint. Document Released: 05/15/2005 Document Revised: 11/14/2011 Document Reviewed: 09/16/2013 ExitCare Patient Information 2015 [employer]. This information is not intended to replace advice given to you by your health care provider. Make sure you  discuss any questions you have with your health care provider.  

## 2014-12-28 NOTE — ED Notes (Signed)
Pt taken to CT.

## 2014-12-28 NOTE — ED Notes (Signed)
Pt c/o lower back pain and L sided rib pain worsening x 3 days. Reports back surgery in January with rods and spacers placed. Per husband, pt fell 2 weeks ago onto the left side. Back pain radiating into R buttock and r leg. Pt reports taking percocet 5mg  without relief

## 2014-12-28 NOTE — ED Notes (Signed)
Pt requesting more pain medication.

## 2014-12-28 NOTE — ED Provider Notes (Signed)
CSN: [id number]     Arrival date & time 12/27/14  2358 History  This chart was scribed for Terri Freiberg, MD by Hansel Feinstein, ED Scribe. This patient was seen in room A07C/A07C and the patient's care was started at 1:39 AM.      Chief Complaint  Patient presents with  . Back Pain   Patient is a 74 y.o. female presenting with back pain. The history is provided by the patient and the spouse. No language interpreter was used.  Back Pain Location:  Lumbar spine Radiates to:  R posterior upper leg Pain severity:  Moderate Onset quality:  Gradual Duration:  7 months Timing:  Constant Progression:  Worsening Chronicity:  Chronic Context: falling   Context: not lifting heavy objects and not recent injury   Relieved by:  Narcotics Worsened by:  Movement Ineffective treatments:  None tried Associated symptoms: no bladder incontinence, no bowel incontinence, no dysuria, no fever, no numbness, no perianal numbness and no weakness   Risk factors: recent surgery     HPI Comments: ROBBI SPELLS is a 74 y.o. female with Hx of lumbar fusion, GERD, osteoporosis, arthritis, HTN who presents to the Emergency Department complaining of moderate left upper back and right lower back pain radiating to the right leg back pain onset 7 months ago and worsened 3 days ago. She states that she had back surgery with Dr. Ellene Route in January and has had constant pain since. Pain is worsened by movement. The husband notes a fall 2 weeks ago and she tried to break her fall causing her to land on her back. Pt is on 5 mg Percocet at home. She notes that she has an MRI scheduled soon but could not wait for the appointment. Husband also notes hernia repair, bowel resection and laparotomy last year. She denies heavy lifting, recent injuries. She also denies numbness, weakness, dysuria, incontinence, genital numbness, fever, nausea, vomiting.   Past Medical History  Diagnosis Date  . Hypercholesteremia   . GERD (gastroesophageal  reflux disease)   . Arthritis   . Osteoporosis   . ANA positive 01/2001  . Cancer     partial hysterectomy age 35-uterine cancer  . SBO (small bowel obstruction) 06/20/2103  . Ileus, postoperative 07/02/2013  . Hx of tobacco use, presenting hazards to health 07/02/2013  . Arthritis 07/02/2013    ANA positive  . Hypertension   . Pneumonia     hx of   Past Surgical History  Procedure Laterality Date  . Colonoscopy  10/25/2011    Procedure: COLONOSCOPY;  Surgeon: Rogene Houston, MD;  [facility];  Service: Endoscopy;  Laterality: N/A;  930  . Tonsillectomy    . Esophagogastroduodenoscopy  02/2009  . Laparotomy N/A 06/20/2013    Procedure: EXPLORATORY LAPAROTOMY;  Surgeon: Harl Bowie, MD;  Location: [place];  Service: General;  Laterality: N/A;  . Bowel resection N/A 06/20/2013    Procedure: SMALL BOWEL RESECTION;  Surgeon: Harl Bowie, MD;  Location: [place];  Service: General;  Laterality: N/A;  . Inguinal hernia repair Left 09/11/2013    Procedure: HERNIA REPAIR INGUINAL INCARCERATED;  Surgeon: Harl Bowie, MD;  Location: [place];  Service: General;  Laterality: Left;  . Abdominal hysterectomy    . Hernia repair Left     groin  . Colon surgery    . Anterior lat lumbar fusion N/A 06/08/2014    Procedure: LUMBAR 2-3 LUMBAR 3-4 LUMBAR 4-5 ANTEROLATERAL DECOMPRESSION/FUSION WITH PERCUTANEOUS PEDICLE SCREWS;  Surgeon: Kristeen Miss, MD;  Location: [facility];  Service: Neurosurgery;  Laterality: N/A;  L2-3 L3-4 L4-5 ANTEROLATERAL DECOMPRESSION/FUSION WITH PERCUTANEOUS PEDICLE SCREWS  . Lumbar percutaneous pedicle screw 3 level N/A 06/08/2014    Procedure: LUMBAR PERCUTANEOUS PEDICLE SCREW 3 LEVEL;  Surgeon: Kristeen Miss, MD;  Location: [place] NEURO ORS;  Service: Neurosurgery;  Laterality: N/A;  . Back surgery     Family History  Problem Relation Age of Onset  . Anesthesia problems Neg Hx   . Hypotension Neg Hx   . Malignant hyperthermia Neg Hx   . Pseudochol  deficiency Neg Hx   . Hypertension Mother   . Heart disease Father   . Diabetes Sister   . Heart disease Sister   . Diabetes Brother   . Heart disease Brother    History  Substance Use Topics  . Smoking status: Former Smoker -- 0.25 packs/day for 0 years    Types: Cigarettes    Quit date: 10/18/2011  . Smokeless tobacco: Not on file  . Alcohol Use: No   OB History    No data available     Review of Systems  Constitutional: Negative for fever.  Gastrointestinal: Negative for nausea, vomiting and bowel incontinence.  Genitourinary: Negative for bladder incontinence and dysuria.  Musculoskeletal: Positive for back pain.  Neurological: Negative for weakness and numbness.  All other systems reviewed and are negative.   Allergies  Codeine and Fosamax  Home Medications   Prior to Admission medications   Medication Sig Start Date End Date Taking? Authorizing Provider  acetaminophen (TYLENOL) 500 MG tablet Take 1,000 mg by mouth every 6 (six) hours as needed for mild pain. Pain   Yes Historical Provider, MD  amLODipine (NORVASC) 5 MG tablet Take 1 tablet (5 mg total) by mouth daily. 09/16/14  Yes Kathyrn Drown, MD  diazepam (VALIUM) 5 MG tablet Take 1 tablet (5 mg total) by mouth every 6 (six) hours as needed (spasms). 12/28/14   Terri Freiberg, MD  ondansetron (ZOFRAN ODT) 8 MG disintegrating tablet Take 1 tablet (8 mg total) by mouth every 8 (eight) hours as needed for nausea or vomiting. Patient not taking: Reported on 12/28/2014 09/03/14   Kathyrn Drown, MD  oxyCODONE-acetaminophen (PERCOCET/ROXICET) 5-325 MG per tablet Take 2 tablets by mouth every 4 (four) hours as needed for severe pain. 12/28/14   Terri Freiberg, MD  pantoprazole (PROTONIX) 40 MG tablet Take 1 tablet (40 mg total) by mouth daily. Patient not taking: Reported on 12/28/2014 09/16/14   Kathyrn Drown, MD   BP 138/71 mmHg  Pulse 67  Temp(Src) 97.7 F (36.5 C) (Oral)  Resp 16  Ht 5' 7.5" (1.715 m)  Wt 130 lb (58.968  kg)  BMI 20.05 kg/m2  SpO2 96%  LMP  (LMP Unknown) Physical Exam  Constitutional: She is oriented to person, place, and time. She appears well-developed and well-nourished.  HENT:  Head: Normocephalic and atraumatic.  Right Ear: External ear normal.  Left Ear: External ear normal.  Eyes: Conjunctivae and EOM are normal. Pupils are equal, round, and reactive to light.  Neck: Normal range of motion. Neck supple.  Cardiovascular: Normal rate, regular rhythm, normal heart sounds and intact distal pulses.   Pulmonary/Chest: Effort normal and breath sounds normal.  Abdominal: Soft. Bowel sounds are normal. There is no tenderness.  Musculoskeletal: Normal range of motion.       Cervical back: Normal.       Thoracic back: Normal.  Lumbar back: Normal.  Neurological: She is alert and oriented to person, place, and time. She has normal strength and normal reflexes. No cranial nerve deficit or sensory deficit. GCS eye subscore is 4. GCS verbal subscore is 5. GCS motor subscore is 6.  Skin: Skin is warm and dry.  Vitals reviewed.   ED Course  Procedures (including critical care time) DIAGNOSTIC STUDIES: Oxygen Saturation is 96% on RA, adequate by my interpretation.    COORDINATION OF CARE: 1:48 AM Discussed treatment plan with pt at bedside and pt agreed to plan.   Labs Review Labs Reviewed  COMPREHENSIVE METABOLIC PANEL - Abnormal; Notable for the following:    Potassium 5.3 (*)    Glucose, Bld 100 (*)    ALT 12 (*)    All other components within normal limits  URINALYSIS, ROUTINE W REFLEX MICROSCOPIC (NOT AT [facility]) - Abnormal; Notable for the following:    Leukocytes, UA TRACE (*)    All other components within normal limits  CBC WITH DIFFERENTIAL/PLATELET  URINE MICROSCOPIC-ADD ON    Imaging Review No results found.   EKG Interpretation None      MDM   Final diagnoses:  Bilateral low back pain with right-sided sciatica    74 y.o. female with pertinent PMH of  chronic back pain, prior surgery for same, HTN presents with atraumatic back pain as above. On arrival the patient's vital signs and physical exam as above. I am unable to elicit the patient's back pain with either movement or palpation of spine or paraspinal areas.  Given possibility of exam findings, concern for occult nephrolithiasis or aortic pathology was heightened. Subsequently, a CT scan of the abdomen was obtained which was unremarkable for acute pathology. The patient had relief of pain with narcotics. She has follow-up with neurosurgery for an MRI of her back. At this time she has no signs of cauda equina, no neurologic deficit, no other exam findings. Discharged home to follow-up with neurosurgery      I have reviewed all laboratory and imaging studies if ordered as above  1. Bilateral low back pain with right-sided sciatica            Terri Freiberg, MD 12/31/14 [phone]

## 2014-12-30 DIAGNOSIS — M5127 Other intervertebral disc displacement, lumbosacral region: Secondary | ICD-10-CM | POA: Diagnosis not present

## 2014-12-30 DIAGNOSIS — M5417 Radiculopathy, lumbosacral region: Secondary | ICD-10-CM | POA: Diagnosis not present

## 2014-12-30 DIAGNOSIS — M4316 Spondylolisthesis, lumbar region: Secondary | ICD-10-CM | POA: Diagnosis not present

## 2014-12-30 DIAGNOSIS — M5136 Other intervertebral disc degeneration, lumbar region: Secondary | ICD-10-CM | POA: Diagnosis not present

## 2014-12-30 DIAGNOSIS — M4714 Other spondylosis with myelopathy, thoracic region: Secondary | ICD-10-CM | POA: Diagnosis not present

## 2015-01-01 ENCOUNTER — Other Ambulatory Visit: Payer: Self-pay | Admitting: Neurological Surgery

## 2015-01-01 DIAGNOSIS — M47814 Spondylosis without myelopathy or radiculopathy, thoracic region: Secondary | ICD-10-CM | POA: Diagnosis not present

## 2015-01-01 DIAGNOSIS — M4714 Other spondylosis with myelopathy, thoracic region: Secondary | ICD-10-CM | POA: Diagnosis not present

## 2015-01-05 ENCOUNTER — Ambulatory Visit (HOSPITAL_COMMUNITY)
Admission: RE | Admit: 2015-01-05 | Discharge: 2015-01-05 | Disposition: A | Discharge status: Home / Self Care | Payer: Medicare Other | Source: Ambulatory Visit | Attending: Neurological Surgery | Admitting: Neurological Surgery

## 2015-01-05 ENCOUNTER — Other Ambulatory Visit (HOSPITAL_COMMUNITY): Payer: Self-pay | Admitting: Neurological Surgery

## 2015-01-05 DIAGNOSIS — R937 Abnormal findings on diagnostic imaging of other parts of musculoskeletal system: Secondary | ICD-10-CM | POA: Insufficient documentation

## 2015-01-05 DIAGNOSIS — M4714 Other spondylosis with myelopathy, thoracic region: Secondary | ICD-10-CM | POA: Diagnosis not present

## 2015-01-05 DIAGNOSIS — R109 Unspecified abdominal pain: Secondary | ICD-10-CM | POA: Diagnosis not present

## 2015-01-05 DIAGNOSIS — I10 Essential (primary) hypertension: Secondary | ICD-10-CM | POA: Diagnosis not present

## 2015-01-11 ENCOUNTER — Other Ambulatory Visit (HOSPITAL_COMMUNITY): Payer: Medicare Other

## 2015-01-18 ENCOUNTER — Inpatient Hospital Stay (HOSPITAL_COMMUNITY): Admission: RE | Admit: 2015-01-18 | Payer: Medicare Other | Source: Ambulatory Visit | Admitting: Neurological Surgery

## 2015-01-18 ENCOUNTER — Encounter (HOSPITAL_COMMUNITY): Admission: RE | Payer: Self-pay | Source: Ambulatory Visit

## 2015-01-18 SURGERY — REMOVAL, HARDWARE
Anesthesia: General

## 2015-03-22 ENCOUNTER — Telehealth: Payer: Self-pay | Admitting: Family Medicine

## 2015-03-22 DIAGNOSIS — L570 Actinic keratosis: Secondary | ICD-10-CM | POA: Diagnosis not present

## 2015-03-22 DIAGNOSIS — D225 Melanocytic nevi of trunk: Secondary | ICD-10-CM | POA: Diagnosis not present

## 2015-03-22 DIAGNOSIS — X32XXXA Exposure to sunlight, initial encounter: Secondary | ICD-10-CM | POA: Diagnosis not present

## 2015-03-22 NOTE — Telephone Encounter (Signed)
Error

## 2015-03-31 DIAGNOSIS — E785 Hyperlipidemia, unspecified: Secondary | ICD-10-CM | POA: Diagnosis not present

## 2015-03-31 DIAGNOSIS — I10 Essential (primary) hypertension: Secondary | ICD-10-CM | POA: Diagnosis not present

## 2015-03-31 DIAGNOSIS — M810 Age-related osteoporosis without current pathological fracture: Secondary | ICD-10-CM | POA: Diagnosis not present

## 2015-04-01 LAB — BASIC METABOLIC PANEL
BUN/Creatinine Ratio: 14 (ref 11–26)
BUN: 8 mg/dL (ref 8–27)
CO2: 25 mmol/L (ref 18–29)
Calcium: 9.7 mg/dL (ref 8.7–10.3)
Chloride: 100 mmol/L (ref 97–106)
Creatinine, Ser: 0.59 mg/dL (ref 0.57–1.00)
GFR calc Af Amer: 105 mL/min/{1.73_m2} (ref >59)
GFR calc non Af Amer: 91 mL/min/{1.73_m2} (ref >59)
Glucose: 83 mg/dL (ref 65–99)
Potassium: 4.4 mmol/L (ref 3.5–5.2)
Sodium: 141 mmol/L (ref 136–144)

## 2015-04-01 LAB — LIPID PANEL
Chol/HDL Ratio: 3.3 ratio units (ref 0.0–4.4)
Cholesterol, Total: 200 mg/dL — ABNORMAL HIGH (ref 100–199)
HDL: 60 mg/dL (ref >39)
LDL Calculated: 121 mg/dL — ABNORMAL HIGH (ref 0–99)
Triglycerides: 93 mg/dL (ref 0–149)
VLDL Cholesterol Cal: 19 mg/dL (ref 5–40)

## 2015-04-01 LAB — VITAMIN D 25 HYDROXY (VIT D DEFICIENCY, FRACTURES): Vit D, 25-Hydroxy: 32.8 ng/mL (ref 30.0–100.0)

## 2015-04-07 ENCOUNTER — Ambulatory Visit: Payer: Medicare Other | Admitting: Family Medicine

## 2015-04-19 ENCOUNTER — Encounter: Payer: Self-pay | Admitting: Family Medicine

## 2015-04-19 ENCOUNTER — Ambulatory Visit (INDEPENDENT_AMBULATORY_CARE_PROVIDER_SITE_OTHER): Payer: Medicare Other | Admitting: Family Medicine

## 2015-04-19 VITALS — BP 130/80 | Ht 67.0 in | Wt 137.0 lb

## 2015-04-19 DIAGNOSIS — M545 Low back pain, unspecified: Secondary | ICD-10-CM | POA: Insufficient documentation

## 2015-04-19 DIAGNOSIS — G8929 Other chronic pain: Secondary | ICD-10-CM | POA: Diagnosis not present

## 2015-04-19 DIAGNOSIS — I10 Essential (primary) hypertension: Secondary | ICD-10-CM | POA: Diagnosis not present

## 2015-04-19 DIAGNOSIS — Z23 Encounter for immunization: Secondary | ICD-10-CM | POA: Diagnosis not present

## 2015-04-19 DIAGNOSIS — E785 Hyperlipidemia, unspecified: Secondary | ICD-10-CM | POA: Diagnosis not present

## 2015-04-19 MED ORDER — AMLODIPINE BESYLATE 5 MG PO TABS: 5.0000 mg | ORAL_TABLET | Freq: Every day | ORAL | Status: DC

## 2015-04-19 MED ORDER — OXYCODONE-ACETAMINOPHEN 5-325 MG PO TABS: 2.0000 | ORAL_TABLET | ORAL | Status: DC | PRN

## 2015-04-19 NOTE — Progress Notes (Signed)
   Subjective:    Patient ID: Terri Harris, female    DOB: [birth year], 74 y.o.   MRN: [id number]  Hypertension This is a chronic problem. The current episode started more than 1 year ago. There are no compliance problems.    she does try to eat healthy try to stay physically active has some chronic low back pain pain medicine does help she denies abusing it. Does not have to take it often. Has history hyperlipidemia watches her diet try to stay physically active as well.  Patient has c/o of right leg pain, and edema.  Review of Systems Patient denies any chest tightness pressure pain shortness breath nausea vomiting diarrhea.    Objective:   Physical Exam Lungs clear heart regular pulse normal extremities no edema skin warm dry 25 minutes was spent with the patient. Greater than half the time was spent in discussion and answering questions and counseling regarding the issues that the patient came in for today.       Assessment & Plan:  Patient has chronic swelling in the right lower leg that I feel is due to venous insufficiency patient states that she will just live with it currently does not want to be on any type of special hose no medications indicated  Chronic low back pain nonsurgical. Is RD had surgery no further surgery indicated pain medication prescribed caution drowsiness and frequent use recommended  Hyperlipidemia watch diet stay physically active repeat again in 6 months  Renal function looks good Glucose looks good Vitamin D looks good

## 2015-05-04 DIAGNOSIS — H40033 Anatomical narrow angle, bilateral: Secondary | ICD-10-CM | POA: Diagnosis not present

## 2015-05-04 DIAGNOSIS — H2513 Age-related nuclear cataract, bilateral: Secondary | ICD-10-CM | POA: Diagnosis not present

## 2015-05-06 ENCOUNTER — Ambulatory Visit (INDEPENDENT_AMBULATORY_CARE_PROVIDER_SITE_OTHER): Payer: Medicare Other | Admitting: Internal Medicine

## 2015-05-06 ENCOUNTER — Other Ambulatory Visit (INDEPENDENT_AMBULATORY_CARE_PROVIDER_SITE_OTHER): Payer: Self-pay | Admitting: Internal Medicine

## 2015-05-06 ENCOUNTER — Encounter (INDEPENDENT_AMBULATORY_CARE_PROVIDER_SITE_OTHER): Payer: Self-pay | Admitting: *Deleted

## 2015-05-06 ENCOUNTER — Encounter (INDEPENDENT_AMBULATORY_CARE_PROVIDER_SITE_OTHER): Payer: Self-pay | Admitting: Internal Medicine

## 2015-05-06 VITALS — BP 130/62 | HR 64 | Temp 97.5°F | Ht 67.5 in | Wt 137.6 lb

## 2015-05-06 DIAGNOSIS — R1310 Dysphagia, unspecified: Secondary | ICD-10-CM | POA: Insufficient documentation

## 2015-05-06 DIAGNOSIS — R1319 Other dysphagia: Secondary | ICD-10-CM

## 2015-05-06 NOTE — Patient Instructions (Signed)
EGD/ED. The risks and benefits such as perforation, bleeding, and infection were reviewed with the patient and is agreeable. 

## 2015-05-06 NOTE — Progress Notes (Signed)
Subjective:    Patient ID: Terri Harris, female    DOB: 1941-02-12, 74 y.o.   MRN: [id number]  HPI Presents today with c/o dysphagia. Symptoms x 1 year. Hx of same. Last EGD/ED was in 2010. Solid food dysphagia has occurred x 3 in the past month.  Meats and breads bother her. Appetite is good. No weight loss. No abdominal. BMs are normal. Usually has one stool a day. No melena or BRRB.  No NSAIDS.  04/07/2009 EGD/ED: Solid food dysphagia. Small sliding hiatal hernia with mild changes of reflux esophagitis. No evidence of ring or stricture. Esophagus dilated by passing a 54 [place] [employer] dilator with hx of dysphagia, but no mucosal disruption was induced. Five small gastric ulcers at antrum.  10/25/2011:   Colonoscopy  Indications:  Patient is 74 year old Caucasian female with history of serrated adenoma. Her last exam was in June 2007. Impression:   Examination performed to cecum. Large flat polyp at ascending colon treated with combination of piecemeal polypectomy and APC. All of the polyp could not be lifted with saline injection. Two small polyps ablated via cold biopsy from splenic flexure.  Biopsy:  All polyps are hyperplastic. Next colonoscopy in 5 years.   Review of Systems Past Medical History  Diagnosis Date  . Hypercholesteremia   . GERD (gastroesophageal reflux disease)   . Arthritis   . Osteoporosis   . ANA positive 01/2001  . Cancer [facility])     partial hysterectomy age 89-uterine cancer  . SBO (small bowel obstruction) ([place]) 06/20/2103  . Ileus, postoperative 07/02/2013  . Hx of tobacco use, presenting hazards to health 07/02/2013  . Arthritis 07/02/2013    ANA positive  . Hypertension   . Pneumonia     hx of    Past Surgical History  Procedure Laterality Date  . Colonoscopy  10/25/2011    Procedure: COLONOSCOPY;  Surgeon: Rogene Houston, MD;  [facility];  Service: Endoscopy;  Laterality: N/A;  930  . Tonsillectomy    . Esophagogastroduodenoscopy   02/2009  . Laparotomy N/A 06/20/2013    Procedure: EXPLORATORY LAPAROTOMY;  Surgeon: Harl Bowie, MD;  Location: [place];  Service: General;  Laterality: N/A;  . Bowel resection N/A 06/20/2013    Procedure: SMALL BOWEL RESECTION;  Surgeon: Harl Bowie, MD;  Location: [place];  Service: General;  Laterality: N/A;  . Inguinal hernia repair Left 09/11/2013    Procedure: HERNIA REPAIR INGUINAL INCARCERATED;  Surgeon: Harl Bowie, MD;  Location: [place];  Service: General;  Laterality: Left;  . Abdominal hysterectomy    . Hernia repair Left     groin  . Colon surgery    . Anterior lat lumbar fusion N/A 06/08/2014    Procedure: LUMBAR 2-3 LUMBAR 3-4 LUMBAR 4-5 ANTEROLATERAL DECOMPRESSION/FUSION WITH PERCUTANEOUS PEDICLE SCREWS;  Surgeon: Kristeen Miss, MD;  Location: [place] NEURO ORS;  Service: Neurosurgery;  Laterality: N/A;  L2-3 L3-4 L4-5 ANTEROLATERAL DECOMPRESSION/FUSION WITH PERCUTANEOUS PEDICLE SCREWS  . Lumbar percutaneous pedicle screw 3 level N/A 06/08/2014    Procedure: LUMBAR PERCUTANEOUS PEDICLE SCREW 3 LEVEL;  Surgeon: Kristeen Miss, MD;  Location: [place] NEURO ORS;  Service: Neurosurgery;  Laterality: N/A;  . Back surgery      Allergies  Allergen Reactions  . Codeine Nausea And Vomiting  . Fosamax [Alendronate Sodium]     gastritis    Current Outpatient Prescriptions on File Prior to Visit  Medication Sig Dispense Refill  . acetaminophen (TYLENOL) 500 MG tablet Take  1,000 mg by mouth every 6 (six) hours as needed for mild pain. Pain    . amLODipine (NORVASC) 5 MG tablet Take 1 tablet (5 mg total) by mouth daily. 90 tablet 3   No current facility-administered medications on file prior to visit.        Objective:   Physical Exam Blood pressure 130/62, pulse 64, temperature 97.5 F (36.4 C), height 5' 7.5" (1.715 m), weight 137 lb 9.6 oz (62.415 kg). Alert and oriented. Skin warm and dry. Oral mucosa is moist.   . Sclera anicteric, conjunctivae is pink. Thyroid not  enlarged. No cervical lymphadenopathy. Lungs clear. Heart regular rate and rhythm.  Abdomen is soft. Bowel sounds are positive. No hepatomegaly. No abdominal masses felt. No tenderness.  No edema to lower extremities.         Assessment & Plan:  Solid food dysphagia. Stricture ring needs to be ruled out.  EGD/ED. The risks and benefits such as perforation, bleeding, and infection were reviewed with the patient and is agreeable.

## 2015-05-14 ENCOUNTER — Other Ambulatory Visit: Payer: Self-pay | Admitting: Family Medicine

## 2015-05-25 ENCOUNTER — Ambulatory Visit (HOSPITAL_COMMUNITY)
Admission: RE | Admit: 2015-05-25 | Discharge: 2015-05-25 | Disposition: A | Discharge status: Home / Self Care | Payer: Medicare Other | Source: Ambulatory Visit | Attending: Internal Medicine | Admitting: Internal Medicine

## 2015-05-25 ENCOUNTER — Encounter (HOSPITAL_COMMUNITY): Payer: Self-pay | Admitting: *Deleted

## 2015-05-25 ENCOUNTER — Encounter (HOSPITAL_COMMUNITY)
Admission: RE | Disposition: A | Discharge status: Home / Self Care | Payer: Self-pay | Source: Ambulatory Visit | Attending: Internal Medicine

## 2015-05-25 DIAGNOSIS — Z8542 Personal history of malignant neoplasm of other parts of uterus: Secondary | ICD-10-CM | POA: Insufficient documentation

## 2015-05-25 DIAGNOSIS — K210 Gastro-esophageal reflux disease with esophagitis: Secondary | ICD-10-CM | POA: Diagnosis not present

## 2015-05-25 DIAGNOSIS — Z79899 Other long term (current) drug therapy: Secondary | ICD-10-CM | POA: Insufficient documentation

## 2015-05-25 DIAGNOSIS — R12 Heartburn: Secondary | ICD-10-CM | POA: Diagnosis not present

## 2015-05-25 DIAGNOSIS — Z87891 Personal history of nicotine dependence: Secondary | ICD-10-CM | POA: Insufficient documentation

## 2015-05-25 DIAGNOSIS — M1990 Unspecified osteoarthritis, unspecified site: Secondary | ICD-10-CM | POA: Diagnosis not present

## 2015-05-25 DIAGNOSIS — I10 Essential (primary) hypertension: Secondary | ICD-10-CM | POA: Insufficient documentation

## 2015-05-25 DIAGNOSIS — Z90710 Acquired absence of both cervix and uterus: Secondary | ICD-10-CM | POA: Diagnosis not present

## 2015-05-25 DIAGNOSIS — K219 Gastro-esophageal reflux disease without esophagitis: Secondary | ICD-10-CM | POA: Diagnosis not present

## 2015-05-25 DIAGNOSIS — K3189 Other diseases of stomach and duodenum: Secondary | ICD-10-CM | POA: Diagnosis not present

## 2015-05-25 DIAGNOSIS — K449 Diaphragmatic hernia without obstruction or gangrene: Secondary | ICD-10-CM | POA: Diagnosis not present

## 2015-05-25 DIAGNOSIS — Z9049 Acquired absence of other specified parts of digestive tract: Secondary | ICD-10-CM | POA: Insufficient documentation

## 2015-05-25 DIAGNOSIS — R1310 Dysphagia, unspecified: Secondary | ICD-10-CM | POA: Diagnosis not present

## 2015-05-25 DIAGNOSIS — M810 Age-related osteoporosis without current pathological fracture: Secondary | ICD-10-CM | POA: Insufficient documentation

## 2015-05-25 DIAGNOSIS — E7800 Pure hypercholesterolemia, unspecified: Secondary | ICD-10-CM | POA: Insufficient documentation

## 2015-05-25 DIAGNOSIS — R1319 Other dysphagia: Secondary | ICD-10-CM | POA: Diagnosis not present

## 2015-05-25 DIAGNOSIS — Z8249 Family history of ischemic heart disease and other diseases of the circulatory system: Secondary | ICD-10-CM | POA: Insufficient documentation

## 2015-05-25 HISTORY — PX: ESOPHAGOGASTRODUODENOSCOPY: SHX5428

## 2015-05-25 HISTORY — PX: ESOPHAGEAL DILATION: SHX303

## 2015-05-25 SURGERY — EGD (ESOPHAGOGASTRODUODENOSCOPY)
Anesthesia: Moderate Sedation

## 2015-05-25 MED ORDER — BUTAMBEN-TETRACAINE-BENZOCAINE 2-2-14 % EX AERO
INHALATION_SPRAY | CUTANEOUS | Status: DC | PRN
  Administered 2015-05-25: 2 via TOPICAL

## 2015-05-25 MED ORDER — MEPERIDINE HCL 50 MG/ML IJ SOLN
INTRAMUSCULAR | Status: DC | PRN
  Administered 2015-05-25 (×2): 25 mg via INTRAVENOUS

## 2015-05-25 MED ORDER — SODIUM CHLORIDE 0.9 % IV SOLN
INTRAVENOUS | Status: DC
  Administered 2015-05-25: 12:00:00 via INTRAVENOUS

## 2015-05-25 MED ORDER — MIDAZOLAM HCL 5 MG/5ML IJ SOLN
INTRAMUSCULAR | Status: AC
  Filled 2015-05-25: qty 10

## 2015-05-25 MED ORDER — MEPERIDINE HCL 50 MG/ML IJ SOLN
INTRAMUSCULAR | Status: AC
  Filled 2015-05-25: qty 1

## 2015-05-25 MED ORDER — MIDAZOLAM HCL 5 MG/5ML IJ SOLN
INTRAMUSCULAR | Status: DC | PRN
  Administered 2015-05-25 (×3): 2 mg via INTRAVENOUS

## 2015-05-25 MED ORDER — STERILE WATER FOR IRRIGATION IR SOLN
Status: DC | PRN
  Administered 2015-05-25: 13:00:00

## 2015-05-25 NOTE — Op Note (Signed)
EGD PROCEDURE REPORT  PATIENT:  Terri Harris  MR#:  [id number] Birthdate:  [birth year], 74 y.o., female Endoscopist:  Dr. Rogene Houston, MD Referred By:  Dr. Scherry Ran Procedure Date: 05/25/2015  Procedure:   EGD with ED  Indications:  Patient is 74 year old Caucasian female who presents with one-year history of solid food dysphagia. Heartburn is well controlled with PPI. She has been on Fosamax in the past and more recently tolerated only for few weeks. She points lower sternum area at site of bolus obstruction.            Informed Consent:  The risks, benefits, alternatives & imponderables which include, but are not limited to, bleeding, infection, perforation, drug reaction and potential missed lesion have been reviewed.  The potential for biopsy, lesion removal, esophageal dilation, etc. have also been discussed.  Questions have been answered.  All parties agreeable.  Please see history & physical in medical record for more information.  Medications:  Demerol 50 mg IV Versed 6 mg IV Cetacaine spray topically for oropharyngeal anesthesia  Description of procedure:  The endoscope was introduced through the mouth and advanced to the second portion of the duodenum without difficulty or limitations. The mucosal surfaces were surveyed very carefully during advancement of the scope and upon withdrawal.  Findings:  Esophagus: Mucosa of the esophagus was normal. Focal area of resistance noted on passing the scope about 2 cm proximal to GE junction. Erythema and edema noted to GE junction without ring or stricture formation.  GEJ:  37 cm Hiatus:  40 cm Stomach: Stomach was empty and distended very well with insufflation. Folds in the proximal stomach were normal. Examination of mucosa at gastric body was normal. Antral and prepyloric scars noted. Pyloric channel was patent. Angularis fundus and cardia were unremarkable. Duodenum:  Normal bulbar and post bulbar  mucosa.  Therapeutic/Diagnostic Maneuvers Performed:   Esophagus was dilated by passing 54 [place] Maloney dilator to full insertion. As the dilator was withdrawn endoscope was passed again and short linear mucosal disruption noted just proximal to GE junction.  Complications:  None  EBL: Minimal  Impression: Mild changes of reflux esophagitis limited to GE junction. Focal area of esophageal spasm proximal to GE junction. Antral and prepyloric scarring. Esophageal dilation with 54 [place] Maloney dilators resulted in small linear mucosal disruption corresponding to site of esophageal spasm.  Recommendations:  Standard instructions given. Patient will call with progress report in one week. If she remains with dysphagia will proceed with further evaluation with esophagogram.  REHMAN,NAJEEB U  05/25/2015  1:37 PM  CC: Dr. Sallee Lange, MD & Dr. Rayne Du ref. provider found

## 2015-05-25 NOTE — H&P (Signed)
Terri Harris is an 74 y.o. female.   Chief Complaint: Patient is here for EGD and ED. HPI: Patient is 74 year old Caucasian female who presents with one-year history of intermittent solid food dysphagia. She points lower sternum area at site of bolus obstruction. She has most difficulty with bread and meat. She does not experience heartburn often. She took Fosamax for about a year about 6 years ago and that she tried again this year with could not tolerate. She developed nausea vomiting and abdominal pain. She does not remember that dysphagia started then. She denies nausea vomiting hematemesis or melena. She has not had any neck weight loss this year. Last EGD with ED was in November 2010. He had small sliding hiatal hernia and mild changes of reflux esophagitis limited to GE junction. Esophagus was dilated by passing 54 [place] Maloney dilator. EGD also revealed few gastric ulcers.  Past Medical History  Diagnosis Date  . Hypercholesteremia   . GERD (gastroesophageal reflux disease)   . Arthritis   . Osteoporosis   . ANA positive 01/2001  . Cancer [facility])     partial hysterectomy age 72-uterine cancer  . SBO (small bowel obstruction) ([place]) 06/20/2103  . Ileus, postoperative 07/02/2013  . Hx of tobacco use, presenting hazards to health 07/02/2013  . Arthritis 07/02/2013    ANA positive  . Hypertension   . Pneumonia     hx of    Past Surgical History  Procedure Laterality Date  . Colonoscopy  10/25/2011    Procedure: COLONOSCOPY;  Surgeon: Rogene Houston, MD;  [facility];  Service: Endoscopy;  Laterality: N/A;  930  . Tonsillectomy    . Esophagogastroduodenoscopy  02/2009  . Laparotomy N/A 06/20/2013    Procedure: EXPLORATORY LAPAROTOMY;  Surgeon: Harl Bowie, MD;  Location: [place];  Service: General;  Laterality: N/A;  . Bowel resection N/A 06/20/2013    Procedure: SMALL BOWEL RESECTION;  Surgeon: Harl Bowie, MD;  Location: [place];  Service: General;  Laterality:  N/A;  . Inguinal hernia repair Left 09/11/2013    Procedure: HERNIA REPAIR INGUINAL INCARCERATED;  Surgeon: Harl Bowie, MD;  Location: [place];  Service: General;  Laterality: Left;  . Abdominal hysterectomy    . Hernia repair Left     groin  . Colon surgery    . Anterior lat lumbar fusion N/A 06/08/2014    Procedure: LUMBAR 2-3 LUMBAR 3-4 LUMBAR 4-5 ANTEROLATERAL DECOMPRESSION/FUSION WITH PERCUTANEOUS PEDICLE SCREWS;  Surgeon: Kristeen Miss, MD;  Location: [place] [facility];  Service: Neurosurgery;  Laterality: N/A;  L2-3 L3-4 L4-5 ANTEROLATERAL DECOMPRESSION/FUSION WITH PERCUTANEOUS PEDICLE SCREWS  . Lumbar percutaneous pedicle screw 3 level N/A 06/08/2014    Procedure: LUMBAR PERCUTANEOUS PEDICLE SCREW 3 LEVEL;  Surgeon: Kristeen Miss, MD;  Location: [place] NEURO ORS;  Service: Neurosurgery;  Laterality: N/A;  . Back surgery      Family History  Problem Relation Age of Onset  . Anesthesia problems Neg Hx   . Hypotension Neg Hx   . Malignant hyperthermia Neg Hx   . Pseudochol deficiency Neg Hx   . Hypertension Mother   . Heart disease Father   . Diabetes Sister   . Heart disease Sister   . Diabetes Brother   . Heart disease Brother    Social History:  reports that she quit smoking about 3 years ago. Her smoking use included Cigarettes. She smoked 0.25 packs per day for 0 years. She does not have any smokeless tobacco history  on file. She reports that she does not drink alcohol or use illicit drugs.  Allergies:  Allergies  Allergen Reactions  . Codeine Nausea And Vomiting  . Fosamax [Alendronate Sodium]     gastritis    Medications Prior to Admission  Medication Sig Dispense Refill  . acetaminophen (TYLENOL) 500 MG tablet Take 1,000 mg by mouth every 6 (six) hours as needed for mild pain. Pain    . amLODipine (NORVASC) 5 MG tablet Take 1 tablet (5 mg total) by mouth daily. 90 tablet 3  . pantoprazole (PROTONIX) 40 MG tablet TAKE 1 TABLET BY MOUTH EVERY DAY 30 tablet 5    No  results found for this or any previous visit (from the past 48 hour(s)). No results found.  ROS  Blood pressure 141/82, pulse 71, temperature 98 F (36.7 C), temperature source Oral, resp. rate 18, SpO2 98 %. Physical Exam  Constitutional: She appears well-developed and well-nourished.  HENT:  Mouth/Throat: Oropharynx is clear and moist.  Eyes: Conjunctivae are normal. No scleral icterus.  Neck: No thyromegaly present.  Cardiovascular: Normal rate, regular rhythm and normal heart sounds.   No murmur heard. Respiratory: Effort normal and breath sounds normal.  GI: Soft. She exhibits no distension. There is no tenderness.  Musculoskeletal: She exhibits no edema.  Lymphadenopathy:    She has no cervical adenopathy.  Neurological: She is alert.  Skin: Skin is warm and dry.     Assessment/Plan Solid food dysphagia. EGD with ED.  REHMAN,NAJEEB U 05/25/2015, 1:14 PM

## 2015-05-25 NOTE — Discharge Instructions (Signed)
Resume usual medications and diet. No driving for 24 hours. Please call office with progress report in one week.  Gastrointestinal Endoscopy, Care After Refer to this sheet in the next few weeks. These instructions provide you with information on caring for yourself after your procedure. Your caregiver may also give you more specific instructions. Your treatment has been planned according to current medical practices, but problems sometimes occur. Call your caregiver if you have any problems or questions after your procedure. HOME CARE INSTRUCTIONS  If you were given medicine to help you relax (sedative), do not drive, operate machinery, or sign important documents for 24 hours.  Avoid alcohol and hot or warm beverages for the first 24 hours after the procedure.  Only take over-the-counter or prescription medicines for pain, discomfort, or fever as directed by your caregiver. You may resume taking your normal medicines unless your caregiver tells you otherwise. Ask your caregiver when you may resume taking medicines that may cause bleeding, such as aspirin, clopidogrel, or warfarin.  You may return to your normal diet and activities on the day after your procedure, or as directed by your caregiver. Walking may help to reduce any bloated feeling in your abdomen.  Drink enough fluids to keep your urine clear or pale yellow.  You may gargle with salt water if you have a sore throat. SEEK IMMEDIATE MEDICAL CARE IF:  You have severe nausea or vomiting.  You have severe abdominal pain, abdominal cramps that last longer than 6 hours, or abdominal swelling (distention).  You have severe shoulder or back pain.  You have trouble swallowing.  You have shortness of breath, your breathing is shallow, or you are breathing faster than normal.  You have a fever or a rapid heartbeat.  You vomit blood or material that looks like coffee grounds.  You have bloody, black, or tarry stools. MAKE SURE  YOU:  Understand these instructions.  Will watch your condition.  Will get help right away if you are not doing well or get worse.   This information is not intended to replace advice given to you by your health care provider. Make sure you discuss any questions you have with your health care provider.   Document Released: 12/28/2003 Document Revised: 06/05/2014 Document Reviewed: 08/15/2011 Elsevier Interactive Patient Education [employer].

## 2015-05-26 ENCOUNTER — Other Ambulatory Visit: Payer: Self-pay | Admitting: Family Medicine

## 2015-05-28 ENCOUNTER — Encounter (HOSPITAL_COMMUNITY): Payer: Self-pay | Admitting: Internal Medicine

## 2015-05-28 IMAGING — CT CT L SPINE W/O CM
3 of 9 series · 10 of 33 positions shown, 12 images · non-contrast
Comparison: [DATE].  Instrumentation fills 12/16/2013.

CLINICAL DATA: Right-sided back pain radiating into the RIGHT
gluteal region. Symptoms since operation [REDACTED]. Lumbosacral
radiculopathy.

EXAM:
CT LUMBAR SPINE WITHOUT CONTRAST
TECHNIQUE: Multidetector CT imaging of the lumbar spine was performed without
intravenous contrast administration. Multiplanar CT image
reconstructions were also generated.

[Series 3: lumbar spine 2.0 b30s · axial · 0.36mm/px · z∈[-182,-80]mm · 2 of 119 slices shown, 3 images]
[im 34/119  soft-tissue]
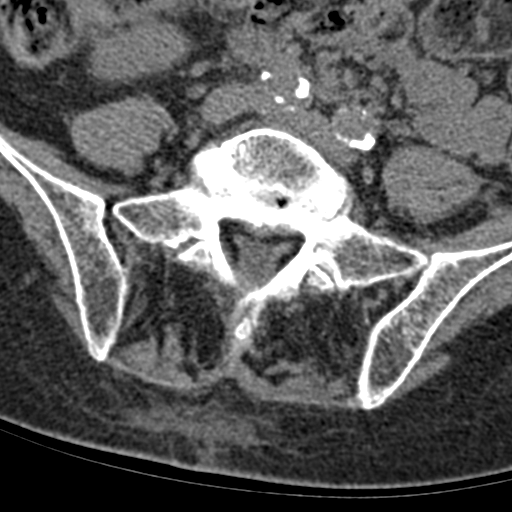
[im 34/119  bone]
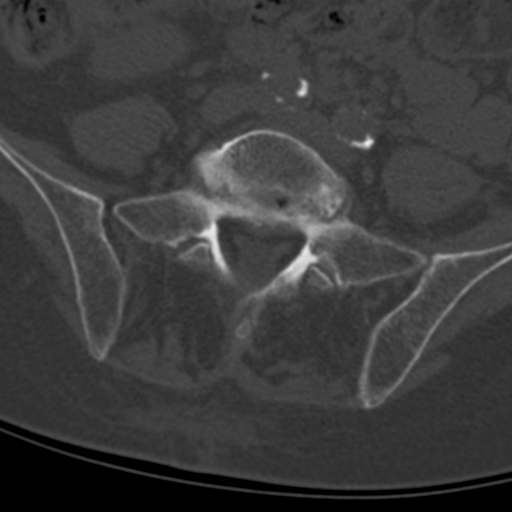
[im 85/119  bone]
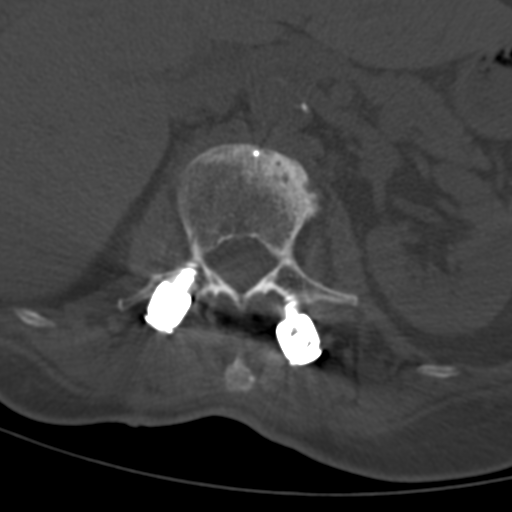

[Series 4: lumbar spine 2.0 spo · coronal · 0.25mm/px · 3 of 80 slices shown (1 of 2)]
[im 16/80  bone]
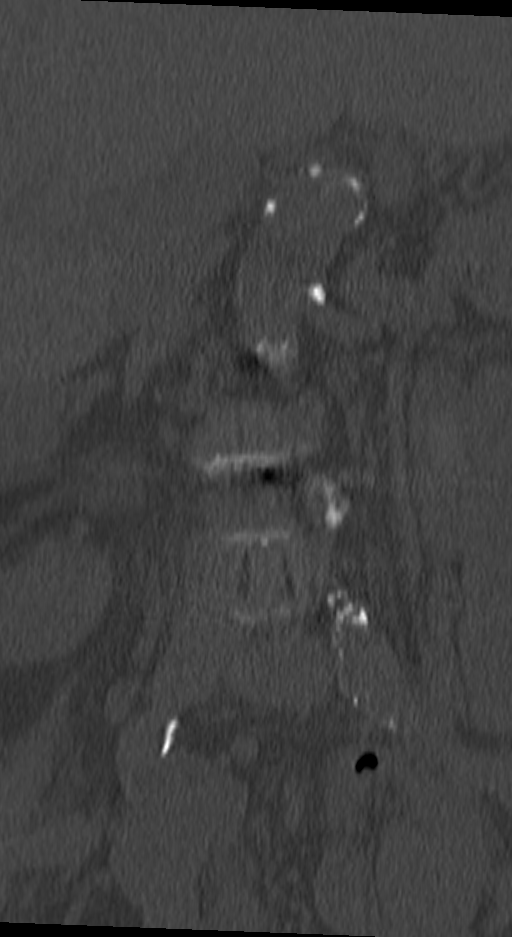
[im 32/80  bone]
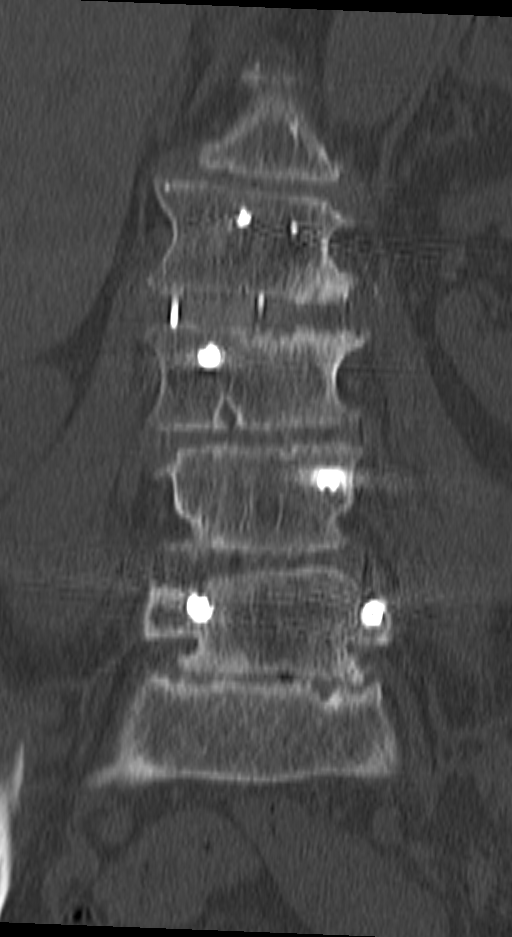
[im 48/80  bone]
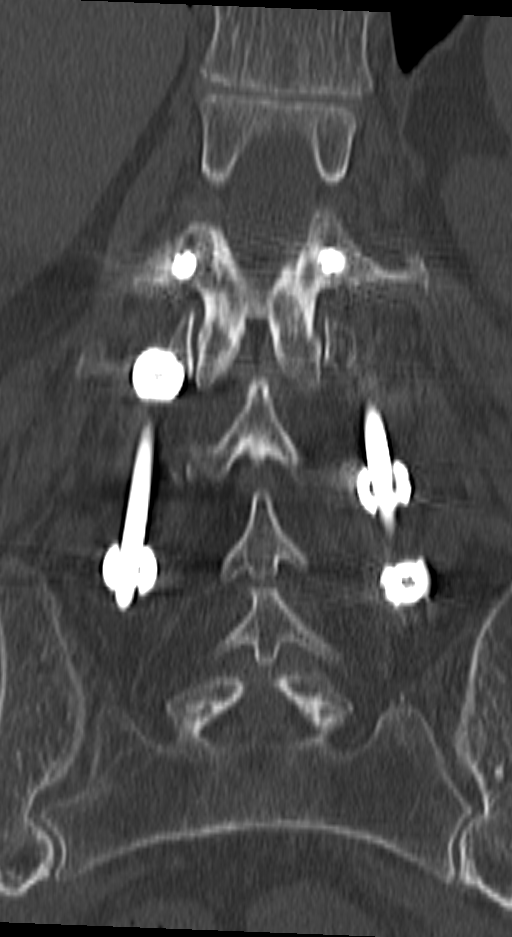

[Series 5: lumbar spine 2.0 spo · sagittal · 0.29mm/px · 5 of 84 slices shown, 6 images (2 of 2)]
[im 28/84  bone]
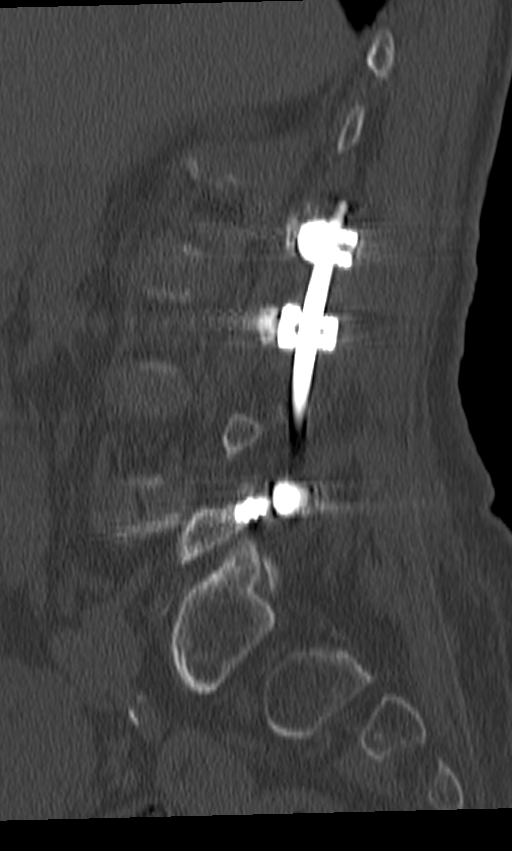
[im 35/84  bone]
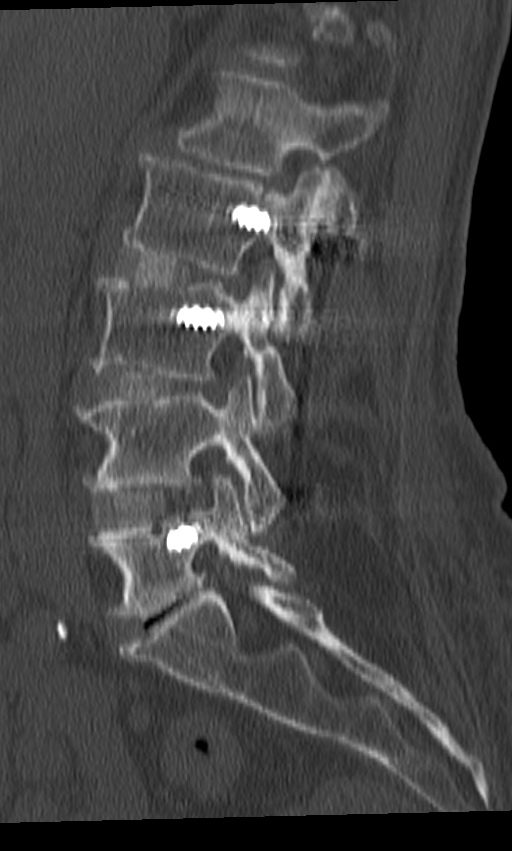
[im 42/84  soft-tissue]
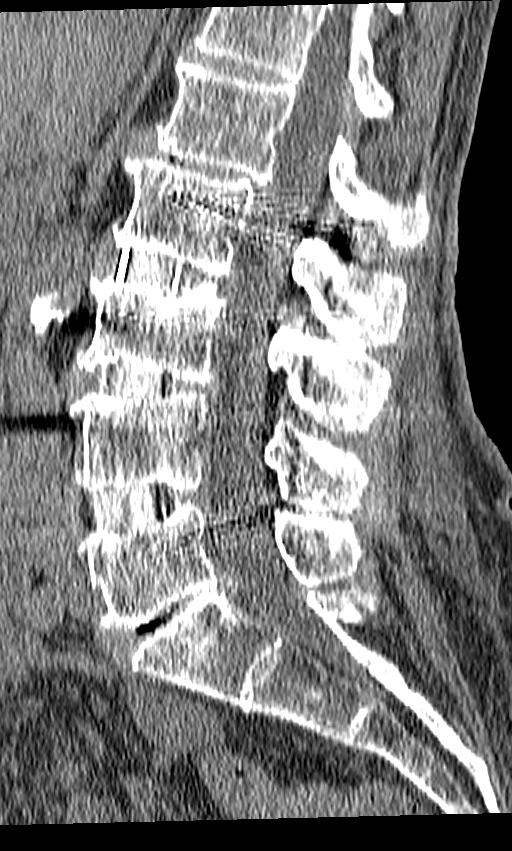
[im 42/84  bone]
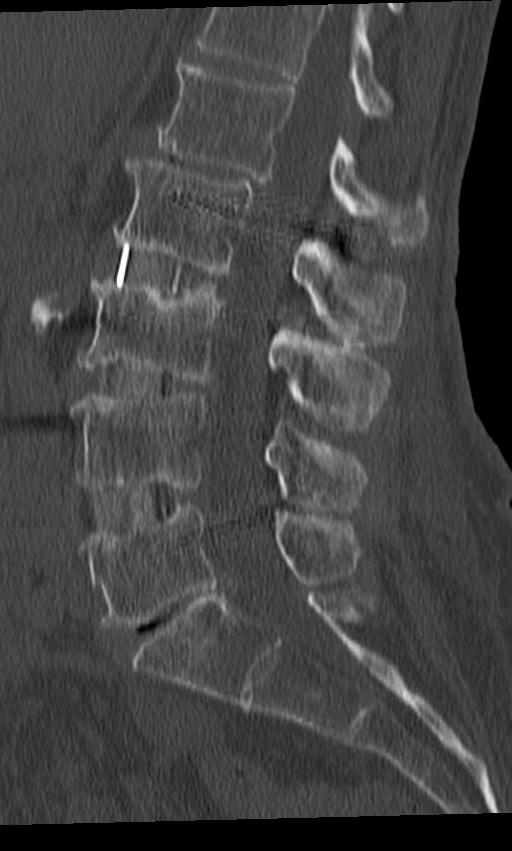
[im 49/84  bone]
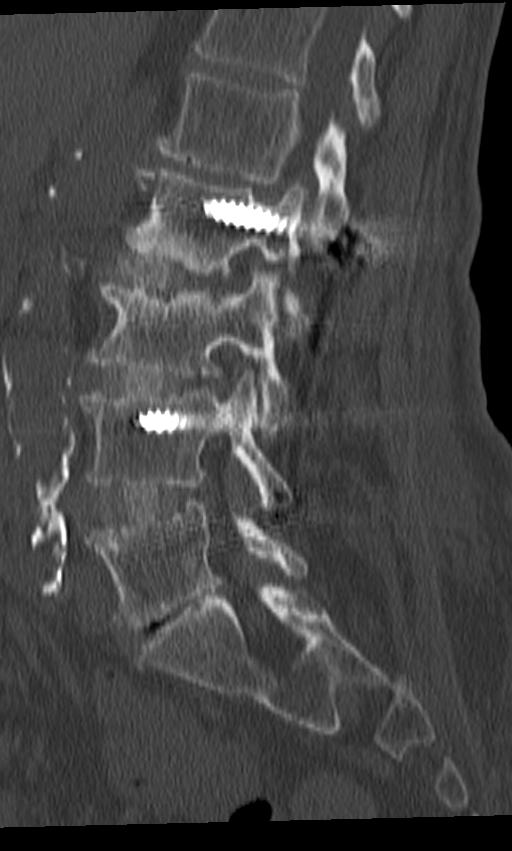
[im 56/84  bone]
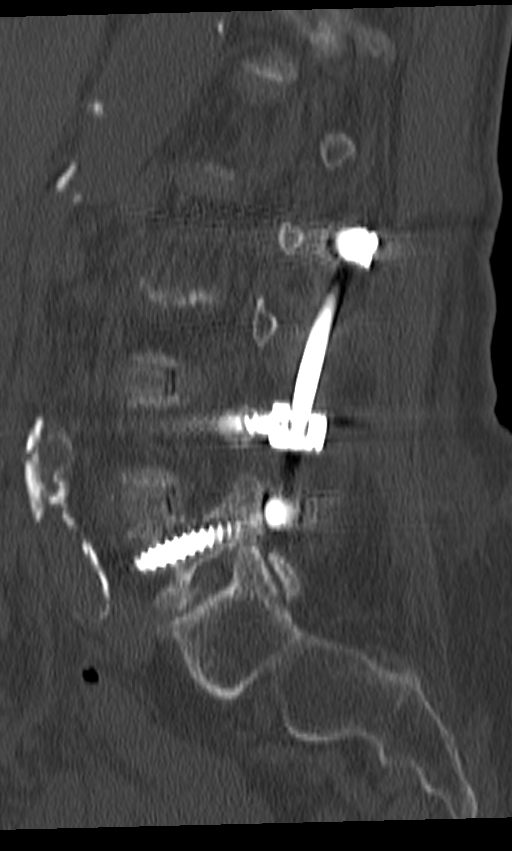

[10 of 33 positions shown; findings below may reference images not displayed]

FINDINGS: Alignment of the lumbar spine appears similar to prior. 3 mm of
retrolisthesis of L1 on L2 is present associated with collapse of
the disc space. Retrolisthesis and collapse of L1-L2 disc are
unchanged compared to CT 06/08/2014. Additionally, the L5-S1 disc is
severely degenerated and appears similar to the prior exams. There
is moderate right-greater-than-left foraminal stenosis at L5-S1.

L2-L3, L3-L4 and L4-L5 discectomy with interbody bone graft. No
hardware migration or encroachment on the central canal from the
interbody spacers. No complicating features to suggest developing
pseudoarthrosis although this timeframe is early. There is no
hardware failure or disconnection. The L2 screws appear within
normal limits. There is a RIGHT L3 screw that also has good
position. LEFT L4 screw also appears to have good position.

The LEFT L5 pedicle screw exits the lateral aspect of the vertebral
body. RIGHT L5 pedicle screw shows normal position.

There is a mildly displaced fracture of the anterior L5 vertebra
without significant loss of vertebral body height. This is best seen
on the sagittal images.

The sacrum appears within normal limits. Bilateral SI joint
osteoarthrosis.

The paraspinal soft tissues show expected postsurgical changes
dorsally. Aortoiliac atherosclerosis is present. Small hyperdense
lesion in the RIGHT kidney likely represents milk of calcium an a
cyst.
IMPRESSION: 1. L2 through L5 XLIF. Hardware as described above. No hardware
failure. No complications of interbody bone graft.
2. Mildly displaced L5 superior anterior vertebral body fracture. No
collapse of the L5 vertebra or subsidence of the interbody bone
graft.
3. Unchanged moderate L1-L2 and L5-S1 spondylosis.

## 2015-06-10 DIAGNOSIS — M5416 Radiculopathy, lumbar region: Secondary | ICD-10-CM | POA: Diagnosis not present

## 2015-09-30 DIAGNOSIS — M5416 Radiculopathy, lumbar region: Secondary | ICD-10-CM | POA: Diagnosis not present

## 2015-09-30 DIAGNOSIS — Z6833 Body mass index (BMI) 33.0-33.9, adult: Secondary | ICD-10-CM | POA: Diagnosis not present

## 2015-10-19 ENCOUNTER — Encounter: Payer: Self-pay | Admitting: Family Medicine

## 2015-10-19 ENCOUNTER — Ambulatory Visit (INDEPENDENT_AMBULATORY_CARE_PROVIDER_SITE_OTHER): Payer: PPO | Admitting: Family Medicine

## 2015-10-19 VITALS — BP 134/86 | Ht 67.0 in | Wt 143.0 lb

## 2015-10-19 DIAGNOSIS — K219 Gastro-esophageal reflux disease without esophagitis: Secondary | ICD-10-CM | POA: Diagnosis not present

## 2015-10-19 DIAGNOSIS — I10 Essential (primary) hypertension: Secondary | ICD-10-CM

## 2015-10-19 DIAGNOSIS — E785 Hyperlipidemia, unspecified: Secondary | ICD-10-CM | POA: Diagnosis not present

## 2015-10-19 NOTE — Progress Notes (Signed)
   Subjective:    Patient ID: Terri Harris, female    DOB: 01/23/1941, 75 y.o.   MRN: [id number]  Hypertension This is a chronic problem. The current episode started more than 1 year ago. There are no compliance problems.    Needs refills on protonix.states it keeps her reflux under good control.   Pt states no problems or concerns today.   Dr elsner-injection Review of Systems Subjective discomfort in her lower back radiates into the right hip region Denies any chest tightness pressure pain shortness breath nausea vomiting diarrhea fever chills    Objective:   Physical Exam  Neck no masses lungs clear no crackles heart regular pulse normal BP good      Assessment & Plan:  HTN watch diet stay active continue medication Reflux continue protonic's on a regular basis keeps it under control Hyperlipidemia check lipid profile also metabolic 7 profile follow-up in 6 months

## 2015-10-22 DIAGNOSIS — M4726 Other spondylosis with radiculopathy, lumbar region: Secondary | ICD-10-CM | POA: Diagnosis not present

## 2015-10-22 DIAGNOSIS — M5136 Other intervertebral disc degeneration, lumbar region: Secondary | ICD-10-CM | POA: Diagnosis not present

## 2015-10-22 DIAGNOSIS — M5416 Radiculopathy, lumbar region: Secondary | ICD-10-CM | POA: Diagnosis not present

## 2015-10-22 DIAGNOSIS — M4806 Spinal stenosis, lumbar region: Secondary | ICD-10-CM | POA: Diagnosis not present

## 2015-11-06 ENCOUNTER — Other Ambulatory Visit: Payer: Self-pay | Admitting: Family Medicine

## 2015-11-11 ENCOUNTER — Other Ambulatory Visit: Payer: Self-pay | Admitting: Family Medicine

## 2016-01-14 DIAGNOSIS — E785 Hyperlipidemia, unspecified: Secondary | ICD-10-CM | POA: Diagnosis not present

## 2016-01-14 DIAGNOSIS — I10 Essential (primary) hypertension: Secondary | ICD-10-CM | POA: Diagnosis not present

## 2016-01-15 LAB — BASIC METABOLIC PANEL
BUN/Creatinine Ratio: 12 (ref 12–28)
BUN: 6 mg/dL — ABNORMAL LOW (ref 8–27)
CO2: 24 mmol/L (ref 18–29)
Calcium: 9.6 mg/dL (ref 8.7–10.3)
Chloride: 100 mmol/L (ref 96–106)
Creatinine, Ser: 0.52 mg/dL — ABNORMAL LOW (ref 0.57–1.00)
GFR calc Af Amer: 109 mL/min/{1.73_m2} (ref >59)
GFR calc non Af Amer: 94 mL/min/{1.73_m2} (ref >59)
Glucose: 91 mg/dL (ref 65–99)
Potassium: 3.9 mmol/L (ref 3.5–5.2)
Sodium: 142 mmol/L (ref 134–144)

## 2016-01-15 LAB — LIPID PANEL
Chol/HDL Ratio: 2.8 ratio units (ref 0.0–4.4)
Cholesterol, Total: 198 mg/dL (ref 100–199)
HDL: 71 mg/dL (ref >39)
LDL Calculated: 105 mg/dL — ABNORMAL HIGH (ref 0–99)
Triglycerides: 110 mg/dL (ref 0–149)
VLDL Cholesterol Cal: 22 mg/dL (ref 5–40)

## 2016-01-16 ENCOUNTER — Encounter: Payer: Self-pay | Admitting: Family Medicine

## 2016-01-18 ENCOUNTER — Ambulatory Visit (INDEPENDENT_AMBULATORY_CARE_PROVIDER_SITE_OTHER): Payer: PPO | Admitting: Family Medicine

## 2016-01-18 ENCOUNTER — Encounter: Payer: Self-pay | Admitting: Family Medicine

## 2016-01-18 VITALS — BP 114/80 | Temp 98.3°F | Ht 66.0 in | Wt 135.0 lb

## 2016-01-18 DIAGNOSIS — R519 Headache, unspecified: Secondary | ICD-10-CM

## 2016-01-18 DIAGNOSIS — R51 Headache: Secondary | ICD-10-CM | POA: Diagnosis not present

## 2016-01-18 MED ORDER — ETODOLAC 400 MG PO TABS: 400.0000 mg | ORAL_TABLET | Freq: Two times a day (BID) | ORAL | 1 refills | Status: DC | PRN

## 2016-01-18 NOTE — Progress Notes (Signed)
   Subjective:    Patient ID: Terri Harris, female    DOB: 24-Oct-1940, 75 y.o.   MRN: [id number]  Headache   This is a new problem. Episode onset: one and a half weeks. The pain is located in the frontal (head feels sore, hurts to open mouth) region. The pain quality is not similar to prior headaches. Quality: soreness. Pertinent negatives include no coughing or fever. Exacerbated by: lying down. Treatments tried: tylenol. The treatment provided moderate relief.   She is not had this problem before. She denies any double vision or loss of vision. Denies any vomiting with it.   Review of Systems  Constitutional: Negative for fatigue and fever.  HENT: Negative for congestion.   Respiratory: Negative for cough.   Cardiovascular: Negative for chest pain.  Neurological: Positive for headaches.       Objective:   Physical Exam  Constitutional: She appears well-nourished. No distress.  Cardiovascular: Normal rate, regular rhythm and normal heart sounds.   No murmur heard. Pulmonary/Chest: Effort normal and breath sounds normal. No respiratory distress.  Musculoskeletal: She exhibits no edema.  Lymphadenopathy:    She has no cervical adenopathy.  Neurological: She is alert. She exhibits normal muscle tone.  Psychiatric: Her behavior is normal.  Vitals reviewed.         Assessment & Plan:  Headache more than likely muscle tension related headache medication recommended. Will check sedimentation rate. Patient not doing better by next week then will need some intervention patient is to call back next week regarding how her headaches are doing certainly if she starts having vomiting waking up in the middle night with headache or getting worse she needs a follow-up here right away or ER

## 2016-01-19 LAB — SEDIMENTATION RATE: Sed Rate: 30 mm/hr (ref 0–40)

## 2016-01-24 ENCOUNTER — Telehealth: Payer: Self-pay | Admitting: Family Medicine

## 2016-01-24 NOTE — Telephone Encounter (Signed)
Patient was seen last week for a headache.  She was told to call back and let Dr. Nicki Reaper know how she was doing.  She said she is doing just fine now.

## 2016-01-24 NOTE — Telephone Encounter (Signed)
Discussed with patient and advised patient Dr Nicki Reaper is glad that headache is gone away. Have the patient notify us if she starts having frequent headaches or her situation worsens. Patient verbalized understanding.

## 2016-01-24 NOTE — Telephone Encounter (Signed)
I am glad that headache is gone away. Have the patient notify us if she starts having frequent headaches or her situation worsens

## 2016-01-26 ENCOUNTER — Encounter: Payer: Self-pay | Admitting: Family Medicine

## 2016-01-26 ENCOUNTER — Ambulatory Visit (INDEPENDENT_AMBULATORY_CARE_PROVIDER_SITE_OTHER): Payer: PPO | Admitting: Family Medicine

## 2016-01-26 DIAGNOSIS — R51 Headache: Secondary | ICD-10-CM | POA: Diagnosis not present

## 2016-01-26 DIAGNOSIS — R519 Headache, unspecified: Secondary | ICD-10-CM | POA: Insufficient documentation

## 2016-01-26 NOTE — Progress Notes (Signed)
   Subjective:    Patient ID: Terri Harris, female    DOB: Jul 06, 1940, 75 y.o.   MRN: [id number]  Headache   This is a recurrent problem. The current episode started 1 to 4 weeks ago. The pain quality is similar to prior headaches. The quality of the pain is described as aching. Associated symptoms include nausea and vomiting. Pertinent negatives include no coughing or fever. Treatments tried: Lodine    Patient describes severe headaches over the past 3-4 weeks it is in the temporal region for head region in the top of her head. She states she does okay throughout the day but then by evening time she started having headaches she'll follow sleep then she wakes up several times during the night with headaches. She has had nausea vomiting once with this. She states the headache is severe enough to keep her from going back to sleep. She also states on days where she is able to sleep sure wake up at 5 AM with a headache. This is not the usual for her. She has had headaches long time ago but none recently. She has not had any changes in medications no injury no trauma. No unilateral numbness or weakness or anything that indicates the possibility of a stroke. Patient's wife states no other concerns this visit.  Review of Systems  Constitutional: Negative for fatigue and fever.  HENT: Negative for congestion.   Respiratory: Negative for cough.   Gastrointestinal: Positive for nausea and vomiting.  Neurological: Positive for headaches.       Objective:   Physical Exam  Constitutional: She appears well-nourished. No distress.  HENT:  Head: Normocephalic.  Cardiovascular: Normal rate, regular rhythm and normal heart sounds.   No murmur heard. Pulmonary/Chest: Effort normal and breath sounds normal.  Musculoskeletal: She exhibits no edema.  Lymphadenopathy:    She has no cervical adenopathy.  Neurological: She is alert.  Psychiatric: Her behavior is normal.  Vitals reviewed.           Assessment & Plan:  Severe nocturnal headaches-this is unusual for this patient. It hits her in the temporal region as well as the for head region and the top of her head. It wakes her up from her sleep she has nausea has thrown up once. In addition to this she wakes up in the morning with a headache. In the past she is not had trouble with headaches.  Because of the red flag of headaches that wake her up in the middle night plus also nausea and vomiting it is necessary to do the MRI to rule out the possibility of tumor growth.  She took anti-inflammatory to help her with the jaw pain it has helped. But has not helped her headache. Sedimentation rate was negative which points away from temporal arteritis.  We will do MRI first then pursue forward with consultation either with neurosurgery or neurology depending on what we find.

## 2016-02-03 ENCOUNTER — Ambulatory Visit (HOSPITAL_COMMUNITY): Payer: PPO

## 2016-02-04 ENCOUNTER — Ambulatory Visit (HOSPITAL_COMMUNITY): Payer: PPO

## 2016-02-10 ENCOUNTER — Ambulatory Visit (HOSPITAL_COMMUNITY)
Admission: RE | Admit: 2016-02-10 | Discharge: 2016-02-10 | Disposition: A | Discharge status: Home / Self Care | Payer: PPO | Source: Ambulatory Visit | Attending: Family Medicine | Admitting: Family Medicine

## 2016-02-10 DIAGNOSIS — J329 Chronic sinusitis, unspecified: Secondary | ICD-10-CM | POA: Insufficient documentation

## 2016-02-10 DIAGNOSIS — R51 Headache: Secondary | ICD-10-CM | POA: Insufficient documentation

## 2016-02-10 DIAGNOSIS — G459 Transient cerebral ischemic attack, unspecified: Secondary | ICD-10-CM | POA: Insufficient documentation

## 2016-02-10 DIAGNOSIS — M316 Other giant cell arteritis: Secondary | ICD-10-CM | POA: Diagnosis not present

## 2016-03-15 DIAGNOSIS — M5416 Radiculopathy, lumbar region: Secondary | ICD-10-CM | POA: Diagnosis not present

## 2016-03-15 DIAGNOSIS — M316 Other giant cell arteritis: Secondary | ICD-10-CM | POA: Diagnosis not present

## 2016-03-20 ENCOUNTER — Other Ambulatory Visit (HOSPITAL_COMMUNITY): Payer: Self-pay | Admitting: Neurological Surgery

## 2016-03-20 DIAGNOSIS — M5416 Radiculopathy, lumbar region: Secondary | ICD-10-CM

## 2016-03-21 ENCOUNTER — Ambulatory Visit (INDEPENDENT_AMBULATORY_CARE_PROVIDER_SITE_OTHER): Payer: PPO | Admitting: Family Medicine

## 2016-03-21 ENCOUNTER — Encounter: Payer: Self-pay | Admitting: Family Medicine

## 2016-03-21 VITALS — BP 140/82 | Ht 66.0 in | Wt 136.2 lb

## 2016-03-21 DIAGNOSIS — D692 Other nonthrombocytopenic purpura: Secondary | ICD-10-CM

## 2016-03-21 DIAGNOSIS — Z23 Encounter for immunization: Secondary | ICD-10-CM | POA: Diagnosis not present

## 2016-03-21 NOTE — Progress Notes (Signed)
Bru

## 2016-03-21 NOTE — Progress Notes (Signed)
   Subjective:    Patient ID: Terri Harris, female    DOB: [birth year], 75 y.o.   MRN: [id number]  HPI Patient in today for bruising to bilateral arms.   she noticed frequent bruising to her arms been present on past couple months she denies any other particular troubles she did take prednisone several months ago but has not taken any since she denies night sweats chills fever shortness of breath or weight loss. States no other concerns this visit.    Review of Systems  denies chest tightness pressure pain shortness breath vomiting diarrhea hematuria rectal bleeding    Objective:   Physical Exam  lungs are clear hearts regular pulse normal multiple hematomas noted on the arms as well as thin skin senile purpura noted as well       Assessment & Plan:  Senile purpura-we will do lab work to make sure there is not underlying clotting disorder issue await the results. Avoid all anti-inflammatories and aspirin currently

## 2016-03-22 LAB — CBC WITH DIFFERENTIAL/PLATELET
Basophils Absolute: 0.1 10*3/uL (ref 0.0–0.2)
Basos: 1 %
EOS (ABSOLUTE): 0.1 10*3/uL (ref 0.0–0.4)
Eos: 1 %
Hematocrit: 42.5 % (ref 34.0–46.6)
Hemoglobin: 13.8 g/dL (ref 11.1–15.9)
Immature Grans (Abs): 0 10*3/uL (ref 0.0–0.1)
Immature Granulocytes: 0 %
Lymphocytes Absolute: 1.5 10*3/uL (ref 0.7–3.1)
Lymphs: 19 %
MCH: 31.4 pg (ref 26.6–33.0)
MCHC: 32.5 g/dL (ref 31.5–35.7)
MCV: 97 fL (ref 79–97)
Monocytes Absolute: 0.6 10*3/uL (ref 0.1–0.9)
Monocytes: 8 %
Neutrophils Absolute: 5.5 10*3/uL (ref 1.4–7.0)
Neutrophils: 71 %
Platelets: 460 10*3/uL — ABNORMAL HIGH (ref 150–379)
RBC: 4.39 x10E6/uL (ref 3.77–5.28)
RDW: 13.4 % (ref 12.3–15.4)
WBC: 7.8 10*3/uL (ref 3.4–10.8)

## 2016-03-22 LAB — HEPATIC FUNCTION PANEL
ALT: 6 IU/L (ref 0–32)
AST: 17 IU/L (ref 0–40)
Albumin: 4.7 g/dL (ref 3.5–4.8)
Alkaline Phosphatase: 100 IU/L (ref 39–117)
Bilirubin Total: 0.4 mg/dL (ref 0.0–1.2)
Bilirubin, Direct: 0.08 mg/dL (ref 0.00–0.40)
Total Protein: 7.7 g/dL (ref 6.0–8.5)

## 2016-03-22 LAB — PT AND PTT
INR: 1 (ref 0.8–1.2)
Prothrombin Time: 10.4 s (ref 9.1–12.0)
aPTT: 31 s (ref 24–33)

## 2016-03-24 ENCOUNTER — Ambulatory Visit (HOSPITAL_COMMUNITY)
Admission: RE | Admit: 2016-03-24 | Discharge: 2016-03-24 | Disposition: A | Discharge status: Home / Self Care | Payer: PPO | Source: Ambulatory Visit | Attending: Neurological Surgery | Admitting: Neurological Surgery

## 2016-03-24 DIAGNOSIS — M5416 Radiculopathy, lumbar region: Secondary | ICD-10-CM | POA: Diagnosis not present

## 2016-03-24 DIAGNOSIS — M5137 Other intervertebral disc degeneration, lumbosacral region: Secondary | ICD-10-CM | POA: Diagnosis not present

## 2016-03-24 DIAGNOSIS — M545 Low back pain: Secondary | ICD-10-CM | POA: Diagnosis not present

## 2016-03-24 LAB — POCT I-STAT CREATININE: Creatinine, Ser: 0.7 mg/dL (ref 0.44–1.00)

## 2016-03-24 MED ORDER — GADOBENATE DIMEGLUMINE 529 MG/ML IV SOLN
15.0000 mL | Freq: Once | INTRAVENOUS | Status: AC | PRN
  Administered 2016-03-24: 12 mL via INTRAVENOUS

## 2016-03-28 ENCOUNTER — Other Ambulatory Visit: Payer: Self-pay | Admitting: *Deleted

## 2016-03-28 MED ORDER — PANTOPRAZOLE SODIUM 40 MG PO TBEC: 40.0000 mg | DELAYED_RELEASE_TABLET | Freq: Every day | ORAL | 0 refills | Status: DC

## 2016-04-05 DIAGNOSIS — M4180 Other forms of scoliosis, site unspecified: Secondary | ICD-10-CM | POA: Diagnosis not present

## 2016-04-05 DIAGNOSIS — G959 Disease of spinal cord, unspecified: Secondary | ICD-10-CM | POA: Diagnosis not present

## 2016-04-11 ENCOUNTER — Other Ambulatory Visit (HOSPITAL_COMMUNITY): Payer: Self-pay | Admitting: Neurological Surgery

## 2016-04-11 DIAGNOSIS — G959 Disease of spinal cord, unspecified: Secondary | ICD-10-CM

## 2016-04-14 ENCOUNTER — Ambulatory Visit (HOSPITAL_COMMUNITY)
Admission: RE | Admit: 2016-04-14 | Discharge: 2016-04-14 | Disposition: A | Discharge status: Home / Self Care | Payer: PPO | Source: Ambulatory Visit | Attending: Neurological Surgery | Admitting: Neurological Surgery

## 2016-04-14 DIAGNOSIS — M2548 Effusion, other site: Secondary | ICD-10-CM | POA: Insufficient documentation

## 2016-04-14 DIAGNOSIS — M4802 Spinal stenosis, cervical region: Secondary | ICD-10-CM | POA: Insufficient documentation

## 2016-04-14 DIAGNOSIS — G959 Disease of spinal cord, unspecified: Secondary | ICD-10-CM | POA: Diagnosis not present

## 2016-04-14 DIAGNOSIS — M47812 Spondylosis without myelopathy or radiculopathy, cervical region: Secondary | ICD-10-CM | POA: Diagnosis not present

## 2016-04-14 DIAGNOSIS — M4712 Other spondylosis with myelopathy, cervical region: Secondary | ICD-10-CM | POA: Diagnosis not present

## 2016-04-18 ENCOUNTER — Ambulatory Visit (INDEPENDENT_AMBULATORY_CARE_PROVIDER_SITE_OTHER): Payer: PPO | Admitting: Family Medicine

## 2016-04-18 ENCOUNTER — Encounter: Payer: Self-pay | Admitting: Family Medicine

## 2016-04-18 VITALS — BP 138/82 | Ht 66.0 in | Wt 136.4 lb

## 2016-04-18 DIAGNOSIS — I10 Essential (primary) hypertension: Secondary | ICD-10-CM

## 2016-04-18 MED ORDER — AMLODIPINE BESYLATE 5 MG PO TABS: 5.0000 mg | ORAL_TABLET | Freq: Every day | ORAL | 1 refills | Status: DC

## 2016-04-18 MED ORDER — PANTOPRAZOLE SODIUM 40 MG PO TBEC: 40.0000 mg | DELAYED_RELEASE_TABLET | Freq: Every day | ORAL | 2 refills | Status: DC

## 2016-04-18 NOTE — Patient Instructions (Signed)
DASH Eating Plan DASH stands for "Dietary Approaches to Stop Hypertension." The DASH eating plan is a healthy eating plan that has been shown to reduce high blood pressure (hypertension). Additional health benefits may include reducing the risk of type 2 diabetes mellitus, heart disease, and stroke. The DASH eating plan may also help with weight loss. What do I need to know about the DASH eating plan? For the DASH eating plan, you will follow these general guidelines:  Choose foods with less than 150 milligrams of sodium per serving (as listed on the food label).  Use salt-free seasonings or herbs instead of table salt or sea salt.  Check with your health care provider or pharmacist before using salt substitutes.  Eat lower-sodium products. These are often labeled as "low-sodium" or "no salt added."  Eat fresh foods. Avoid eating a lot of canned foods.  Eat more vegetables, fruits, and low-fat dairy products.  Choose whole grains. Look for the word "whole" as the first word in the ingredient list.  Choose fish and skinless chicken or turkey more often than red meat. Limit fish, poultry, and meat to 6 oz (170 g) each day.  Limit sweets, desserts, sugars, and sugary drinks.  Choose heart-healthy fats.  Eat more home-cooked food and less restaurant, buffet, and fast food.  Limit fried foods.  Do not fry foods. Cook foods using methods such as baking, boiling, grilling, and broiling instead.  When eating at a restaurant, ask that your food be prepared with less salt, or no salt if possible. What foods can I eat? Seek help from a dietitian for individual calorie needs. Grains  Whole grain or whole wheat bread. Brown rice. Whole grain or whole wheat pasta. Quinoa, bulgur, and whole grain cereals. Low-sodium cereals. Corn or whole wheat flour tortillas. Whole grain cornbread. Whole grain crackers. Low-sodium crackers. Vegetables  Fresh or frozen vegetables (raw, steamed, roasted, or  grilled). Low-sodium or reduced-sodium tomato and vegetable juices. Low-sodium or reduced-sodium tomato sauce and paste. Low-sodium or reduced-sodium canned vegetables. Fruits  All fresh, canned (in natural juice), or frozen fruits. Meat and Other Protein Products  Ground beef (85% or leaner), grass-fed beef, or beef trimmed of fat. Skinless chicken or turkey. Ground chicken or turkey. Pork trimmed of fat. All fish and seafood. Eggs. Dried beans, peas, or lentils. Unsalted nuts and seeds. Unsalted canned beans. Dairy  Low-fat dairy products, such as skim or 1% milk, 2% or reduced-fat cheeses, low-fat ricotta or cottage cheese, or plain low-fat yogurt. Low-sodium or reduced-sodium cheeses. Fats and Oils  Tub margarines without trans fats. Light or reduced-fat mayonnaise and salad dressings (reduced sodium). Avocado. Safflower, olive, or canola oils. Natural peanut or almond butter. Other  Unsalted popcorn and pretzels. The items listed above may not be a complete list of recommended foods or beverages. Contact your dietitian for more options.  What foods are not recommended? Grains  White bread. White pasta. White rice. Refined cornbread. Bagels and croissants. Crackers that contain trans fat. Vegetables  Creamed or fried vegetables. Vegetables in a cheese sauce. Regular canned vegetables. Regular canned tomato sauce and paste. Regular tomato and vegetable juices. Fruits  Canned fruit in light or heavy syrup. Fruit juice. Meat and Other Protein Products  Fatty cuts of meat. Ribs, chicken wings, bacon, sausage, bologna, salami, chitterlings, fatback, hot dogs, bratwurst, and packaged luncheon meats. Salted nuts and seeds. Canned beans with salt. Dairy  Whole or 2% milk, cream, half-and-half, and cream cheese. Whole-fat or sweetened yogurt. Full-fat cheeses   or blue cheese. Nondairy creamers and whipped toppings. Processed cheese, cheese spreads, or cheese curds. Condiments  Onion and garlic  salt, seasoned salt, table salt, and sea salt. Canned and packaged gravies. Worcestershire sauce. Tartar sauce. Barbecue sauce. Teriyaki sauce. Soy sauce, including reduced sodium. Steak sauce. Fish sauce. Oyster sauce. Cocktail sauce. Horseradish. Ketchup and mustard. Meat flavorings and tenderizers. Bouillon cubes. Hot sauce. Tabasco sauce. Marinades. Taco seasonings. Relishes. Fats and Oils  Butter, stick margarine, lard, shortening, ghee, and bacon fat. Coconut, palm kernel, or palm oils. Regular salad dressings. Other  Pickles and olives. Salted popcorn and pretzels. The items listed above may not be a complete list of foods and beverages to avoid. Contact your dietitian for more information.  Where can I find more information? National Heart, [employer], [employer]: www.nhlbi.nih.gov/health/health-topics/topics/dash/ This information is not intended to replace advice given to you by your health care provider. Make sure you discuss any questions you have with your health care provider. Document Released: 05/04/2011 Document Revised: 10/21/2015 Document Reviewed: 03/19/2013 Elsevier Interactive Patient Education  2017 [employer]  

## 2016-04-18 NOTE — Progress Notes (Signed)
   Subjective:    Patient ID: Terri Harris, female    DOB: 1941/02/23, 75 y.o.   MRN: [id number]  Hypertension  This is a chronic problem. The current episode started more than 1 year ago. Risk factors for coronary artery disease include post-menopausal state. Treatments tried: norvasc. There are no compliance problems.    Patient does state reflex seems to be doing fairly well. Once refills on her medicines. Trying eat healthy. Unable to do a lot of physical activity because of back and neck pain.    Review of Systems No chest tightness pressure pain shortness breath wheezing vomiting diarrhea    Objective:   Physical Exam Lungs clear heart regular HEENT benign love pressure good does left and right arm   Patient defers on bone density till next year    Assessment & Plan:  Hypertension good control watch diet stay active continue medication follow-up again in 6 months time follow-up sooner if any problems

## 2016-04-19 DIAGNOSIS — G959 Disease of spinal cord, unspecified: Secondary | ICD-10-CM | POA: Diagnosis not present

## 2016-04-19 DIAGNOSIS — M5416 Radiculopathy, lumbar region: Secondary | ICD-10-CM | POA: Diagnosis not present

## 2016-04-24 ENCOUNTER — Ambulatory Visit: Payer: PPO | Admitting: Family Medicine

## 2016-04-25 DIAGNOSIS — H2513 Age-related nuclear cataract, bilateral: Secondary | ICD-10-CM | POA: Diagnosis not present

## 2016-04-25 DIAGNOSIS — H40033 Anatomical narrow angle, bilateral: Secondary | ICD-10-CM | POA: Diagnosis not present

## 2016-05-15 DIAGNOSIS — H25811 Combined forms of age-related cataract, right eye: Secondary | ICD-10-CM | POA: Diagnosis not present

## 2016-05-15 DIAGNOSIS — H25812 Combined forms of age-related cataract, left eye: Secondary | ICD-10-CM | POA: Diagnosis not present

## 2016-06-01 DIAGNOSIS — H25812 Combined forms of age-related cataract, left eye: Secondary | ICD-10-CM | POA: Diagnosis not present

## 2016-06-01 DIAGNOSIS — H2512 Age-related nuclear cataract, left eye: Secondary | ICD-10-CM | POA: Diagnosis not present

## 2016-06-21 DIAGNOSIS — H25811 Combined forms of age-related cataract, right eye: Secondary | ICD-10-CM | POA: Diagnosis not present

## 2016-06-21 DIAGNOSIS — H2511 Age-related nuclear cataract, right eye: Secondary | ICD-10-CM | POA: Diagnosis not present

## 2016-08-11 ENCOUNTER — Encounter: Payer: Self-pay | Admitting: Nurse Practitioner

## 2016-08-11 ENCOUNTER — Ambulatory Visit (INDEPENDENT_AMBULATORY_CARE_PROVIDER_SITE_OTHER): Payer: PPO | Admitting: Nurse Practitioner

## 2016-08-11 VITALS — BP 120/80 | Temp 97.9°F | Ht 66.0 in | Wt 139.1 lb

## 2016-08-11 DIAGNOSIS — M810 Age-related osteoporosis without current pathological fracture: Secondary | ICD-10-CM | POA: Diagnosis not present

## 2016-08-11 DIAGNOSIS — M545 Low back pain, unspecified: Secondary | ICD-10-CM

## 2016-08-11 DIAGNOSIS — G8929 Other chronic pain: Secondary | ICD-10-CM

## 2016-08-11 DIAGNOSIS — M1990 Unspecified osteoarthritis, unspecified site: Secondary | ICD-10-CM

## 2016-08-11 MED ORDER — NAPROXEN 375 MG PO TBEC: DELAYED_RELEASE_TABLET | ORAL | 0 refills | Status: DC

## 2016-08-11 NOTE — Progress Notes (Signed)
Subjective:  Presents today with complaints of stiffness and aching in bilateral lower extremities, from hips down. Symptoms initially appeared approximately 3 weeks ago with mild, constant aching.  Increased aching and difficulties when getting up from laying or sitting for periods of time, but gradually improve with activity.  No interference with completing ADLs.  Denies n/t, swelling, or redness.  Denies trauma or changes in bowel or bladder function.  Takes tylenol prn with mild relief and prescription ibuprofen with good relief . Has hx of arthritis in cervical/lumbar spine with previous surgeries. History of osteoporosis,  unable to tolerate Fosamax.  Last bone density scan done 2015.   Objective:   BP 120/80   Temp 97.9 F (36.6 C) (Oral)   Ht 5\' 6"  (1.676 m)   Wt 139 lb 2 oz (63.1 kg)   LMP  (LMP Unknown)   BMI 22.46 kg/m  Alert and oriented, NAD.  Chest: Lungs CTA Cardiac: RRR, no murmurs Musc: kyphosis, cervical. lumbar spine non tender to palpation, heberdens nodes bilateral hands, full ROM, no edema, erythema to BLE.    Assessment:   Problem List Items Addressed This Visit      Musculoskeletal and Integument   Osteoporosis (Chronic)   Relevant Orders   DG Bone Density   Arthritis - Primary   Relevant Medications   Naproxen 375 MG TBEC     Other   Chronic lumbar pain   Relevant Medications   Naproxen 375 MG TBEC       Plan:   Meds ordered this encounter  Medications  . Naproxen 375 MG TBEC    Sig: One po BID with food prn arthritis pain    Dispense:  60 each    Refill:  0    Order Specific Question:   Supervising Provider    Answer:   Mikey Kirschner [[id number]]   Take naproxen BID as needed for pain.  Plan is to be conservative with NSAIDS related to GI history.  Can continue to also take tylenol as needed.  Notify pain worsens or fails to improve.  Notify office or go to ED if have any symptoms of bowel or bladder incontinence,  sudden increased weakness, or  changes in sensation.    Follow up as needed

## 2016-08-15 ENCOUNTER — Ambulatory Visit (HOSPITAL_COMMUNITY)
Admission: RE | Admit: 2016-08-15 | Discharge: 2016-08-15 | Disposition: A | Discharge status: Home / Self Care | Payer: PPO | Source: Ambulatory Visit | Attending: Nurse Practitioner | Admitting: Nurse Practitioner

## 2016-08-15 DIAGNOSIS — M810 Age-related osteoporosis without current pathological fracture: Secondary | ICD-10-CM | POA: Diagnosis not present

## 2016-08-16 ENCOUNTER — Other Ambulatory Visit: Payer: Self-pay | Admitting: Nurse Practitioner

## 2016-08-24 ENCOUNTER — Other Ambulatory Visit: Payer: Self-pay | Admitting: Nurse Practitioner

## 2016-08-24 MED ORDER — ALENDRONATE SODIUM 70 MG PO TABS: 70.0000 mg | ORAL_TABLET | ORAL | 11 refills | Status: DC

## 2016-08-28 ENCOUNTER — Ambulatory Visit: Payer: PPO | Admitting: Nurse Practitioner

## 2016-08-29 ENCOUNTER — Other Ambulatory Visit: Payer: Self-pay | Admitting: Family Medicine

## 2016-10-16 ENCOUNTER — Ambulatory Visit: Payer: PPO | Admitting: Family Medicine

## 2016-10-20 ENCOUNTER — Ambulatory Visit (INDEPENDENT_AMBULATORY_CARE_PROVIDER_SITE_OTHER): Payer: PPO | Admitting: Family Medicine

## 2016-10-20 ENCOUNTER — Encounter: Payer: Self-pay | Admitting: Family Medicine

## 2016-10-20 VITALS — BP 110/70 | Ht 66.0 in | Wt 137.1 lb

## 2016-10-20 DIAGNOSIS — I10 Essential (primary) hypertension: Secondary | ICD-10-CM | POA: Diagnosis not present

## 2016-10-20 MED ORDER — PANTOPRAZOLE SODIUM 40 MG PO TBEC: 40.0000 mg | DELAYED_RELEASE_TABLET | Freq: Every day | ORAL | 2 refills | Status: DC

## 2016-10-20 MED ORDER — AMLODIPINE BESYLATE 5 MG PO TABS: 5.0000 mg | ORAL_TABLET | Freq: Every day | ORAL | 1 refills | Status: DC

## 2016-10-20 MED ORDER — OXYCODONE-ACETAMINOPHEN 5-325 MG PO TABS: 1.0000 | ORAL_TABLET | ORAL | 0 refills | Status: DC | PRN

## 2016-10-20 NOTE — Progress Notes (Signed)
Subjective:     Patient ID: Terri Harris, female   DOB: 22-Dec-1940, 76 y.o.   MRN: [id number]  Hypertension  This is a chronic problem. The current episode started more than 1 year ago. Pertinent negatives include no chest pain, headaches or shortness of breath. There are no compliance problems.    Patient states no other concerns this visit.  Review of Systems  Constitutional: Negative for activity change, fatigue and fever.  Respiratory: Negative for cough and shortness of breath.   Cardiovascular: Negative for chest pain and leg swelling.  Neurological: Negative for headaches.       Objective:   Physical Exam  Constitutional: She appears well-nourished. No distress.  Cardiovascular: Normal rate, regular rhythm and normal heart sounds.   No murmur heard. Pulmonary/Chest: Effort normal and breath sounds normal. No respiratory distress.  Musculoskeletal: She exhibits no edema.  Lymphadenopathy:    She has no cervical adenopathy.  Neurological: She is alert. She exhibits normal muscle tone.  Psychiatric: Her behavior is normal.  Vitals reviewed.      Assessment:     Lumbar pain Hypertension Patient requests prescription for oxycodone for occasional use to help with severe back pain     Plan:     Reflux stable continue medication Osteoporosis tolerating Fosamax continue Blood pressure good continue medication Intermittent lumbar pain and discomfort long-standing previous surgeries she will follow-up with her surgeon oxycodone for pain uses infrequently caution drowsiness

## 2016-10-27 ENCOUNTER — Encounter (INDEPENDENT_AMBULATORY_CARE_PROVIDER_SITE_OTHER): Payer: Self-pay | Admitting: *Deleted

## 2016-12-22 ENCOUNTER — Other Ambulatory Visit (INDEPENDENT_AMBULATORY_CARE_PROVIDER_SITE_OTHER): Payer: Self-pay | Admitting: *Deleted

## 2016-12-22 DIAGNOSIS — Z86010 Personal history of colonic polyps: Secondary | ICD-10-CM

## 2017-03-28 DIAGNOSIS — I10 Essential (primary) hypertension: Secondary | ICD-10-CM | POA: Diagnosis not present

## 2017-03-28 DIAGNOSIS — M5416 Radiculopathy, lumbar region: Secondary | ICD-10-CM | POA: Diagnosis not present

## 2017-03-28 DIAGNOSIS — G959 Disease of spinal cord, unspecified: Secondary | ICD-10-CM | POA: Diagnosis not present

## 2017-04-06 ENCOUNTER — Telehealth (INDEPENDENT_AMBULATORY_CARE_PROVIDER_SITE_OTHER): Payer: Self-pay | Admitting: *Deleted

## 2017-04-06 ENCOUNTER — Encounter (INDEPENDENT_AMBULATORY_CARE_PROVIDER_SITE_OTHER): Payer: Self-pay | Admitting: *Deleted

## 2017-04-06 NOTE — Telephone Encounter (Signed)
Patient needs trilyte 

## 2017-04-09 ENCOUNTER — Encounter: Payer: Self-pay | Admitting: Family Medicine

## 2017-04-09 ENCOUNTER — Ambulatory Visit: Payer: PPO | Admitting: Family Medicine

## 2017-04-09 VITALS — BP 120/78 | Ht 66.0 in | Wt 134.0 lb

## 2017-04-09 DIAGNOSIS — I10 Essential (primary) hypertension: Secondary | ICD-10-CM

## 2017-04-09 DIAGNOSIS — M810 Age-related osteoporosis without current pathological fracture: Secondary | ICD-10-CM | POA: Diagnosis not present

## 2017-04-09 DIAGNOSIS — Z23 Encounter for immunization: Secondary | ICD-10-CM | POA: Diagnosis not present

## 2017-04-09 DIAGNOSIS — Z79899 Other long term (current) drug therapy: Secondary | ICD-10-CM | POA: Diagnosis not present

## 2017-04-09 DIAGNOSIS — K219 Gastro-esophageal reflux disease without esophagitis: Secondary | ICD-10-CM | POA: Diagnosis not present

## 2017-04-09 DIAGNOSIS — E785 Hyperlipidemia, unspecified: Secondary | ICD-10-CM

## 2017-04-09 MED ORDER — PEG 3350-KCL-NA BICARB-NACL 420 G PO SOLR: 4000.0000 mL | Freq: Once | ORAL | 0 refills | Status: DC

## 2017-04-09 MED ORDER — OXYCODONE-ACETAMINOPHEN 5-325 MG PO TABS: ORAL_TABLET | ORAL | 0 refills | Status: DC

## 2017-04-09 NOTE — Progress Notes (Signed)
   Subjective:    Patient ID: Terri Harris, female    DOB: 04-07-1941, 76 y.o.   MRN: [id number]  HPI Patient is here today to follow up on her HTN. She is currently on Amlodipine 5 mg one daily. She states she eats healthy,and tries to get exercise.She would like to have flu injection today.   Dr Quitman Livings shots,saw him in Oct- legs ache more at night  Osteoporosis-has osteoporosis cannot tolerate Fosamax is tried 2 separate times will need to go with IV medicine  Lumbar and leg pain Patient relates the lumbar leg pain she is tried the injections that helped some she denies weakness just relates a lot of burning pain and discomfort pain medicine at nighttime helps  Patient also relates reflux illness takes her medicine under good control at one time she was having difficulty swallowing water but is no longer trouble no dysphagia currently  She also has had mild lipid issues she will need to have labs rechecked she is trying to eat      Review of Systems  Constitutional: Negative for activity change, fatigue and fever.  HENT: Negative for congestion.   Respiratory: Negative for cough, chest tightness and shortness of breath.   Cardiovascular: Negative for chest pain and leg swelling.  Gastrointestinal: Negative for abdominal pain.  Skin: Negative for color change.  Neurological: Negative for headaches.  Psychiatric/Behavioral: Negative for behavioral problems.       Objective:   Physical Exam  Constitutional: She appears well-developed and well-nourished. No distress.  HENT:  Head: Normocephalic and atraumatic.  Eyes: Right eye exhibits no discharge. Left eye exhibits no discharge.  Neck: No tracheal deviation present.  Cardiovascular: Normal rate, regular rhythm and normal heart sounds.  No murmur heard. Pulmonary/Chest: Effort normal and breath sounds normal. No respiratory distress. She has no wheezes. She has no rales.  Musculoskeletal: She exhibits no edema.    Lymphadenopathy:    She has no cervical adenopathy.  Neurological: She is alert. She exhibits normal muscle tone.  Skin: Skin is warm and dry. No erythema.  Psychiatric: Her behavior is normal.  Vitals reviewed.   25 minutes was spent with the patient. Greater than half the time was spent in discussion and answering questions and counseling regarding the issues that the patient came in for today.       Assessment & Plan:  Osteoporosis cannot tolerate oral Fosamax because of side effects therefore we will go with IV Reclast  Bilateral leg pain probably related to her lumbar spine problems.  She uses pain medicine it in the evening to help her with this does not want to use it during the day therefore 3 prescriptions were written for 30 tablets apiece she will follow-up within 3 months  HTN good control with current measures  Has a mild history of hyperlipidemia check lab work await results previous labs reviewed  Reflux good control no current dysphagia issues she does have a colonoscopy coming up in December  Patient also has history of renal insufficiency we will check lab work to ensure stability

## 2017-04-10 ENCOUNTER — Telehealth (INDEPENDENT_AMBULATORY_CARE_PROVIDER_SITE_OTHER): Payer: Self-pay | Admitting: *Deleted

## 2017-04-10 NOTE — Telephone Encounter (Signed)
Referring MD/PCP: scott luking   Procedure: tcs  Reason/Indication:  Hx polyps  Has patient had this procedure before?  Yes, 2013  If so, when, by whom and where?    Is there a family history of colon cancer?  no  Who?  What age when diagnosed?    Is patient diabetic?   no      Does patient have prosthetic heart valve or mechanical valve?  no  Do you have a pacemaker?  no  Has patient ever had endocarditis? no  Has patient had joint replacement within last 12 months?  no  Is patient constipated or take laxatives? no  Does patient have a history of alcohol/drug use?  no  Is patient on Coumadin, Plavix and/or Aspirin? no  Medications: see epic  Allergies: see epic  Medication Adjustment per Dr Laural Golden:   Procedure date & time: 05/09/17 at 1030

## 2017-04-11 NOTE — Telephone Encounter (Signed)
agree

## 2017-05-07 ENCOUNTER — Other Ambulatory Visit: Payer: Self-pay | Admitting: Family Medicine

## 2017-05-09 ENCOUNTER — Encounter (HOSPITAL_COMMUNITY): Payer: Self-pay | Admitting: *Deleted

## 2017-05-09 ENCOUNTER — Encounter (HOSPITAL_COMMUNITY)
Admission: RE | Disposition: A | Discharge status: Home / Self Care | Payer: Self-pay | Source: Ambulatory Visit | Attending: Internal Medicine

## 2017-05-09 ENCOUNTER — Other Ambulatory Visit: Payer: Self-pay

## 2017-05-09 ENCOUNTER — Ambulatory Visit (HOSPITAL_COMMUNITY)
Admission: RE | Admit: 2017-05-09 | Discharge: 2017-05-09 | Disposition: A | Discharge status: Home / Self Care | Payer: PPO | Source: Ambulatory Visit | Attending: Internal Medicine | Admitting: Internal Medicine

## 2017-05-09 DIAGNOSIS — Z981 Arthrodesis status: Secondary | ICD-10-CM | POA: Insufficient documentation

## 2017-05-09 DIAGNOSIS — Z8719 Personal history of other diseases of the digestive system: Secondary | ICD-10-CM | POA: Diagnosis not present

## 2017-05-09 DIAGNOSIS — Z87891 Personal history of nicotine dependence: Secondary | ICD-10-CM | POA: Diagnosis not present

## 2017-05-09 DIAGNOSIS — Z833 Family history of diabetes mellitus: Secondary | ICD-10-CM | POA: Insufficient documentation

## 2017-05-09 DIAGNOSIS — I10 Essential (primary) hypertension: Secondary | ICD-10-CM | POA: Diagnosis not present

## 2017-05-09 DIAGNOSIS — Z860100 Personal history of colon polyps, unspecified: Secondary | ICD-10-CM | POA: Insufficient documentation

## 2017-05-09 DIAGNOSIS — Z8542 Personal history of malignant neoplasm of other parts of uterus: Secondary | ICD-10-CM | POA: Insufficient documentation

## 2017-05-09 DIAGNOSIS — Z1211 Encounter for screening for malignant neoplasm of colon: Secondary | ICD-10-CM | POA: Insufficient documentation

## 2017-05-09 DIAGNOSIS — Z90710 Acquired absence of both cervix and uterus: Secondary | ICD-10-CM | POA: Diagnosis not present

## 2017-05-09 DIAGNOSIS — Z8249 Family history of ischemic heart disease and other diseases of the circulatory system: Secondary | ICD-10-CM | POA: Insufficient documentation

## 2017-05-09 DIAGNOSIS — D122 Benign neoplasm of ascending colon: Secondary | ICD-10-CM | POA: Insufficient documentation

## 2017-05-09 DIAGNOSIS — Z79899 Other long term (current) drug therapy: Secondary | ICD-10-CM | POA: Insufficient documentation

## 2017-05-09 DIAGNOSIS — Z888 Allergy status to other drugs, medicaments and biological substances status: Secondary | ICD-10-CM | POA: Insufficient documentation

## 2017-05-09 DIAGNOSIS — E7800 Pure hypercholesterolemia, unspecified: Secondary | ICD-10-CM | POA: Diagnosis not present

## 2017-05-09 DIAGNOSIS — K219 Gastro-esophageal reflux disease without esophagitis: Secondary | ICD-10-CM | POA: Insufficient documentation

## 2017-05-09 DIAGNOSIS — Z885 Allergy status to narcotic agent status: Secondary | ICD-10-CM | POA: Diagnosis not present

## 2017-05-09 DIAGNOSIS — M1990 Unspecified osteoarthritis, unspecified site: Secondary | ICD-10-CM | POA: Diagnosis not present

## 2017-05-09 DIAGNOSIS — Z08 Encounter for follow-up examination after completed treatment for malignant neoplasm: Secondary | ICD-10-CM | POA: Diagnosis not present

## 2017-05-09 DIAGNOSIS — D125 Benign neoplasm of sigmoid colon: Secondary | ICD-10-CM | POA: Diagnosis not present

## 2017-05-09 HISTORY — PX: COLONOSCOPY: SHX5424

## 2017-05-09 HISTORY — PX: POLYPECTOMY: SHX5525

## 2017-05-09 SURGERY — COLONOSCOPY
Anesthesia: Moderate Sedation

## 2017-05-09 MED ORDER — MEPERIDINE HCL 50 MG/ML IJ SOLN
INTRAMUSCULAR | Status: DC | PRN
  Administered 2017-05-09 (×2): 25 mg via INTRAVENOUS

## 2017-05-09 MED ORDER — MIDAZOLAM HCL 5 MG/5ML IJ SOLN
INTRAMUSCULAR | Status: DC | PRN
  Administered 2017-05-09: 2 mg via INTRAVENOUS
  Administered 2017-05-09: 1 mg via INTRAVENOUS
  Administered 2017-05-09 (×2): 2 mg via INTRAVENOUS

## 2017-05-09 MED ORDER — SODIUM CHLORIDE 0.9 % IV SOLN
INTRAVENOUS | Status: DC
  Administered 2017-05-09: 10:00:00 via INTRAVENOUS

## 2017-05-09 MED ORDER — MIDAZOLAM HCL 5 MG/5ML IJ SOLN
INTRAMUSCULAR | Status: AC
  Filled 2017-05-09: qty 10

## 2017-05-09 MED ORDER — MEPERIDINE HCL 50 MG/ML IJ SOLN
INTRAMUSCULAR | Status: AC
  Filled 2017-05-09: qty 1

## 2017-05-09 NOTE — Discharge Instructions (Signed)
No aspirin or NSAIDs for 1 week. Resume other medications as before. High-fiber diet. No driving for 24 hours. Physician will call with biopsy results.   Colon Polyps Polyps are tissue growths inside the body. Polyps can grow in many places, including the large intestine (colon). A polyp may be a round bump or a mushroom-shaped growth. You could have one polyp or several. Most colon polyps are noncancerous (benign). However, some colon polyps can become cancerous over time. What are the causes? The exact cause of colon polyps is not known. What increases the risk? This condition is more likely to develop in people who:  Have a family history of colon cancer or colon polyps.  Are older than 27 or older than 45 if they are African American.  Have inflammatory bowel disease, such as ulcerative colitis or Crohn disease.  Are overweight.  Smoke cigarettes.  Do not get enough exercise.  Drink too much alcohol.  Eat a diet that is: ? High in fat and red meat. ? Low in fiber.  Had childhood cancer that was treated with abdominal radiation.  What are the signs or symptoms? Most polyps do not cause symptoms. If you have symptoms, they may include:  Blood coming from your rectum when having a bowel movement.  Blood in your stool.The stool may look dark red or black.  A change in bowel habits, such as constipation or diarrhea.  How is this diagnosed? This condition is diagnosed with a colonoscopy. This is a procedure that uses a lighted, flexible scope to look at the inside of your colon. How is this treated? Treatment for this condition involves removing any polyps that are found. Those polyps will then be tested for cancer. If cancer is found, your health care provider will talk to you about options for colon cancer treatment. Follow these instructions at home: Diet  Eat plenty of fiber, such as fruits, vegetables, and whole grains.  Eat foods that are high in calcium and  vitamin D, such as milk, cheese, yogurt, eggs, liver, fish, and broccoli.  Limit foods high in fat, red meats, and processed meats, such as hot dogs, sausage, bacon, and lunch meats.  Maintain a healthy weight, or lose weight if recommended by your health care provider. General instructions  Do not smoke cigarettes.  Do not drink alcohol excessively.  Keep all follow-up visits as told by your health care provider. This is important. This includes keeping regularly scheduled colonoscopies. Talk to your health care provider about when you need a colonoscopy.  Exercise every day or as told by your health care provider. Contact a health care provider if:  You have new or worsening bleeding during a bowel movement.  You have new or increased blood in your stool.  You have a change in bowel habits.  You unexpectedly lose weight. This information is not intended to replace advice given to you by your health care provider. Make sure you discuss any questions you have with your health care provider. Document Released: 02/09/2004 Document Revised: 10/21/2015 Document Reviewed: 04/05/2015 Elsevier Interactive Patient Education  [employer].

## 2017-05-09 NOTE — Op Note (Addendum)
[facility] Patient Name: Terri Harris Procedure Date: 05/09/2017 10:02 AM MRN: [id number] Date of Birth: 1940/08/22 Attending MD: Hildred Laser , MD CSN: [id number] Age: 76 Admit Type: Outpatient Procedure:                Colonoscopy Indications:              High risk colon cancer surveillance: Personal                            history of colonic polyps Providers:                Hildred Laser, MD, Janeece Riggers, RN, Lurline Del, RN Referring MD:             Elayne Snare. Wolfgang Phoenix, MD Medicines:                Meperidine 50 mg IV, Midazolam 7 mg IV Complications:            No immediate complications. Estimated Blood Loss:     Estimated blood loss was minimal. Estimated blood                            loss was minimal. Procedure:                Pre-Anesthesia Assessment:                           - Prior to the procedure, a History and Physical                            was performed, and patient medications and                            allergies were reviewed. The patient's tolerance of                            previous anesthesia was also reviewed. The risks                            and benefits of the procedure and the sedation                            options and risks were discussed with the patient.                            All questions were answered, and informed consent                            was obtained. Prior Anticoagulants: The patient has                            taken no previous anticoagulant or antiplatelet                            agents. ASA Grade Assessment: II - A patient with  mild systemic disease. After reviewing the risks                            and benefits, the patient was deemed in                            satisfactory condition to undergo the procedure.                           After obtaining informed consent, the colonoscope                            was passed under direct vision. Throughout the                       procedure, the patient's blood pressure, pulse, and                            oxygen saturations were monitored continuously. The                            [id number] ([id number]) was introduced through the                            anus and advanced to the the cecum, identified by                            appendiceal orifice and ileocecal valve. The                            colonoscopy was performed without difficulty. The                            patient tolerated the procedure well. The quality                            of the bowel preparation was good. The ileocecal                            valve, appendiceal orifice, and rectum were                            photographed. Scope In: 10:32:22 AM Scope Out: 11:15:26 AM Scope Withdrawal Time: 0 hours 33 minutes 4 seconds  Total Procedure Duration: 0 hours 43 minutes 4 seconds  Findings:      The perianal and digital rectal examinations were normal.      A small polyp was found in the proximal sigmoid colon. The polyp was       flat. Biopsies were taken with a cold forceps for histology. The       pathology specimen was placed into Bottle Number 1.      Two flat polyps were found in the proximal sigmoid colon. The polyps       were medium in size. These were biopsied with a cold forceps for       histology.  The pathology specimen was placed into Bottle Number 1.       Coagulation for destruction of remaining portion of lesion using argon       plasma was successful.      Two sessile polyps were found in the distal sigmoid colon. The polyps       were medium in size. These were biopsied with a cold forceps for       histology. The pathology specimen was placed into Bottle Number 2.       Coagulation for destruction of remaining portion of lesion using argon       plasma was successful.      A small polyp was found in the distal ascending colon. The polyp was       semi-sessile. The polyp was removed with a  cold snare. Resection and       retrieval were complete. The pathology specimen was placed into Bottle       Number 2.      A single medium-mouthed diverticulum was found in the sigmoid colon.      Anal papilla(e) were hypertrophied. Impression:               - One small polyp in the proximal sigmoid colon.                            Biopsied.                           - Two medium polyps in the proximal sigmoid colon.                            Biopsied. Treated with argon plasma coagulation                            (APC).                           - Two medium polyps in the distal sigmoid colon.                            Biopsied. Treated with argon plasma coagulation                            (APC).                           - One small polyp in the distal ascending colon,                            removed with a cold snare. Resected and retrieved. Moderate Sedation:      Moderate (conscious) sedation was administered by the endoscopy nurse       and supervised by the endoscopist. The following parameters were       monitored: oxygen saturation, heart rate, blood pressure, CO2       capnography and response to care. Total physician intraservice time was       48 minutes. Recommendation:           - Patient has a contact number available for  emergencies. The signs and symptoms of potential                            delayed complications were discussed with the                            patient. Return to normal activities tomorrow.                            Written discharge instructions were provided to the                            patient.                           - Resume previous diet today.                           - Continue present medications.                           - No aspirin, ibuprofen, naproxen, or other                            non-steroidal anti-inflammatory drugs for 7 days                            after polyp removal.                            - Await pathology results.                           - Repeat colonoscopy for surveillance based on                            pathology results. Procedure Code(s):        --- Professional ---                           [phone], Colonoscopy, flexible; with ablation of                            tumor(s), polyp(s), or other lesion(s) (includes                            pre- and post-dilation and guide wire passage, when                            performed)                           45385, 59, Colonoscopy, flexible; with removal of                            tumor(s), polyp(s), or other lesion(s) by snare  technique                           45380, 59, Colonoscopy, flexible; with biopsy,                            single or multiple                           99152, Moderate sedation services provided by the                            same physician or other qualified health care                            professional performing the diagnostic or                            therapeutic service that the sedation supports,                            requiring the presence of an independent trained                            observer to assist in the monitoring of the                            patient's level of consciousness and physiological                            status; initial 15 minutes of intraservice time,                            patient age 57 years or older                           [phone], Moderate sedation services; each additional                            15 minutes intraservice time                           99153, Moderate sedation services; each additional                            15 minutes intraservice time Diagnosis Code(s):        --- Professional ---                           Z86.010, Personal history of colonic polyps                           D12.5, Benign neoplasm of sigmoid colon                           D12.2,  Benign neoplasm of ascending colon CPT  copyright 2016 American Medical Association. All rights reserved. The codes documented in this report are preliminary and upon coder review may  be revised to meet current compliance requirements. Hildred Laser, MD Hildred Laser, MD 05/09/2017 11:39:06 AM This report has been signed electronically. Number of Addenda: 0

## 2017-05-09 NOTE — H&P (Signed)
Terri Harris is an 76 y.o. female.   Chief Complaint: Patient is here for colonoscopy. HPI: Patient is a 76 year old Caucasian female with history of colonic adenoma as well as history of hyperplastic polyps.  She is here for surveillance colonoscopy.  She denies abdominal pain or rectal bleeding.  Since her small bowel surgery she has not had normal stools.  Stool comes in bits and pieces or thin caliber.  She does take Mylanta on frequent basis and wonders if Mylanta is the cause for change in her bowel habits. He had an adenoma removed endoscopy about 11 years ago.  On last colonoscopy 5 years ago she had multiple polyps removed enlargement from hepatic flexure but these are hypoplastic. Family history is negative for CRC.  Past Medical History:  Diagnosis Date  .  01/2001  .    Marland Kitchen Arthritis; osteoarthrosis. 07/02/2013   ANA positive  . Cancer [facility])    partial hysterectomy age 64-uterine cancer  . GERD (gastroesophageal reflux disease)   . Hx of tobacco use, presenting hazards to health 07/02/2013  . Hypercholesteremia   . Hypertension   . Ileus, postoperative ([place]) 07/02/2013  . Osteoporosis   . Pneumonia    hx of  . SBO (small bowel obstruction) ([place]) 06/20/2103    Past Surgical History:  Procedure Laterality Date  . ABDOMINAL HYSTERECTOMY    . ANTERIOR LAT LUMBAR FUSION N/A 06/08/2014   Procedure: LUMBAR 2-3 LUMBAR 3-4 LUMBAR 4-5 ANTEROLATERAL DECOMPRESSION/FUSION WITH PERCUTANEOUS PEDICLE SCREWS;  Surgeon: Kristeen Miss, MD;  Location: [facility];  Service: Neurosurgery;  Laterality: N/A;  L2-3 L3-4 L4-5 ANTEROLATERAL DECOMPRESSION/FUSION WITH PERCUTANEOUS PEDICLE SCREWS  . BACK SURGERY    . BOWEL RESECTION N/A 06/20/2013   Procedure: SMALL BOWEL RESECTION;  Surgeon: Harl Bowie, MD;  Location: [facility];  Service: General;  Laterality: N/A;  . COLON SURGERY    . COLONOSCOPY  10/25/2011   Procedure: COLONOSCOPY;  Surgeon: Rogene Houston, MD;  [facility];  Service:  Endoscopy;  Laterality: N/A;  930  . ESOPHAGEAL DILATION N/A 05/25/2015   Procedure: ESOPHAGEAL DILATION;  Surgeon: Rogene Houston, MD;  [facility];  Service: Endoscopy;  Laterality: N/A;  . ESOPHAGOGASTRODUODENOSCOPY  02/2009  . ESOPHAGOGASTRODUODENOSCOPY N/A 05/25/2015   Procedure: ESOPHAGOGASTRODUODENOSCOPY (EGD);  Surgeon: Rogene Houston, MD;  [facility];  Service: Endoscopy;  Laterality: N/A;  . HERNIA REPAIR Left    groin  . INGUINAL HERNIA REPAIR Left 09/11/2013   Procedure: HERNIA REPAIR INGUINAL INCARCERATED;  Surgeon: Harl Bowie, MD;  Location: [place];  Service: General;  Laterality: Left;  . LAPAROTOMY N/A 06/20/2013   Procedure: EXPLORATORY LAPAROTOMY;  Surgeon: Harl Bowie, MD;  Location: [place];  Service: General;  Laterality: N/A;  . LUMBAR PERCUTANEOUS PEDICLE SCREW 3 LEVEL N/A 06/08/2014   Procedure: LUMBAR PERCUTANEOUS PEDICLE SCREW 3 LEVEL;  Surgeon: Kristeen Miss, MD;  Location: [place] [facility];  Service: Neurosurgery;  Laterality: N/A;  . TONSILLECTOMY      Family History  Problem Relation Age of Onset  . Hypertension Mother   . Heart disease Father   . Diabetes Sister   . Heart disease Sister   . Diabetes Brother   . Heart disease Brother   . Anesthesia problems Neg Hx   . Hypotension Neg Hx   . Malignant hyperthermia Neg Hx   . Pseudochol deficiency Neg Hx    Social History:  reports that she quit smoking about 5  years ago. Her smoking use included cigarettes. She smoked 0.25 packs per day for 0.00 years. she has never used smokeless tobacco. She reports that she does not drink alcohol or use drugs.  Allergies:  Allergies  Allergen Reactions  . Codeine Nausea And Vomiting  . Fosamax [Alendronate Sodium]     gastritis    Medications Prior to Admission  Medication Sig Dispense Refill  . acetaminophen (TYLENOL) 500 MG tablet Take 1,000 mg by mouth every 6 (six) hours as needed for mild pain. Pain    . alum & mag  hydroxide-simeth (MAALOX/MYLANTA) 200-200-20 MG/5ML suspension Take 15 mLs by mouth every 6 (six) hours as needed for indigestion or heartburn.    Marland Kitchen amLODipine (NORVASC) 5 MG tablet TAKE 1 TABLET BY MOUTH EVERY DAY 90 tablet 1  . oxyCODONE-acetaminophen (PERCOCET/ROXICET) 5-325 MG tablet 1 each evening as needed for pain 30 tablet 0  . pantoprazole (PROTONIX) 40 MG tablet Take 1 tablet (40 mg total) by mouth daily. 90 tablet 2    No results found for this or any previous visit (from the past 48 hour(s)). No results found.  ROS  Blood pressure 138/65, pulse 69, temperature 97.9 F (36.6 C), temperature source Oral, resp. rate 20, height 5\' 6"  (1.676 m), weight 130 lb (59 kg), SpO2 99 %. Physical Exam  Constitutional: She appears well-developed and well-nourished.  HENT:  Mouth/Throat: Oropharynx is clear and moist.  Eyes: Conjunctivae are normal. No scleral icterus.  Neck: No thyromegaly present.  Cardiovascular: Normal rate, regular rhythm and normal heart sounds.  No murmur heard. Respiratory: Effort normal and breath sounds normal.  GI: Soft. She exhibits no distension and no mass. There is no tenderness.  Midline scar.  Musculoskeletal: She exhibits no edema.  Lymphadenopathy:    She has no cervical adenopathy.  Neurological: She is alert.  Skin: Skin is warm and dry.     Assessment/Plan History of colonic adenoma and hyperplastic polyps. Surveillance colonoscopy.  Hildred Laser, MD 05/09/2017, 10:20 AM

## 2017-05-11 ENCOUNTER — Encounter (HOSPITAL_COMMUNITY): Payer: Self-pay | Admitting: Internal Medicine

## 2017-05-15 ENCOUNTER — Encounter (HOSPITAL_COMMUNITY): Payer: Self-pay

## 2017-05-15 ENCOUNTER — Encounter (HOSPITAL_COMMUNITY)
Admission: RE | Admit: 2017-05-15 | Discharge: 2017-05-15 | Disposition: A | Discharge status: Home / Self Care | Payer: PPO | Source: Ambulatory Visit | Attending: Family Medicine | Admitting: Family Medicine

## 2017-05-15 DIAGNOSIS — M810 Age-related osteoporosis without current pathological fracture: Secondary | ICD-10-CM | POA: Diagnosis not present

## 2017-05-15 LAB — COMPREHENSIVE METABOLIC PANEL
ALT: 11 U/L — ABNORMAL LOW (ref 14–54)
AST: 23 U/L (ref 15–41)
Albumin: 4.4 g/dL (ref 3.5–5.0)
Alkaline Phosphatase: 96 U/L (ref 38–126)
Anion gap: 12 (ref 5–15)
BUN: 8 mg/dL (ref 6–20)
CO2: 26 mmol/L (ref 22–32)
Calcium: 9.6 mg/dL (ref 8.9–10.3)
Chloride: 98 mmol/L — ABNORMAL LOW (ref 101–111)
Creatinine, Ser: 0.56 mg/dL (ref 0.44–1.00)
GFR calc Af Amer: >60 mL/min (ref >60)
GFR calc non Af Amer: >60 mL/min (ref >60)
Glucose, Bld: 102 mg/dL — ABNORMAL HIGH (ref 65–99)
Potassium: 3.9 mmol/L (ref 3.5–5.1)
Sodium: 136 mmol/L (ref 135–145)
Total Bilirubin: 0.4 mg/dL (ref 0.3–1.2)
Total Protein: 7.8 g/dL (ref 6.5–8.1)

## 2017-05-15 MED ORDER — ZOLEDRONIC ACID 5 MG/100ML IV SOLN
5.0000 mg | Freq: Once | INTRAVENOUS | Status: AC
  Administered 2017-05-15: 5 mg via INTRAVENOUS
  Filled 2017-05-15: qty 100

## 2017-05-15 MED ORDER — SODIUM CHLORIDE 0.9 % IV SOLN
INTRAVENOUS | Status: DC
  Administered 2017-05-15: 250 mL via INTRAVENOUS

## 2017-05-15 NOTE — Discharge Instructions (Signed)

## 2017-05-15 NOTE — Progress Notes (Signed)
Results for Terri Harris, Terri Harris (MRN [id number]) as of 05/15/2017 14:45  Labs done today prior to her first Reclast infusion. Tolerated well.   Ref. Range 05/09/2017 11:39 05/15/2017 10:50  COMPREHENSIVE METABOLIC PANEL Unknown  Rpt (A)  Sodium Latest Ref Range: 135 - 145 mmol/L  136  Potassium Latest Ref Range: 3.5 - 5.1 mmol/L  3.9  Chloride Latest Ref Range: 101 - 111 mmol/L  98 (L)  CO2 Latest Ref Range: 22 - 32 mmol/L  26  Glucose Latest Ref Range: 65 - 99 mg/dL  102 (H)  BUN Latest Ref Range: 6 - 20 mg/dL  8  Creatinine Latest Ref Range: 0.44 - 1.00 mg/dL  0.56  Calcium Latest Ref Range: 8.9 - 10.3 mg/dL  9.6  Anion gap Latest Ref Range: 5 - 15   12  Alkaline Phosphatase Latest Ref Range: 38 - 126 U/L  96  Albumin Latest Ref Range: 3.5 - 5.0 g/dL  4.4  AST Latest Ref Range: 15 - 41 U/L  23  ALT Latest Ref Range: 14 - 54 U/L  11 (L)  Total Protein Latest Ref Range: 6.5 - 8.1 g/dL  7.8  Total Bilirubin Latest Ref Range: 0.3 - 1.2 mg/dL  0.4  GFR, Est Non African American Latest Ref Range: >60 mL/min  >60  GFR, Est African American Latest Ref Range: >60 mL/min  >60

## 2017-05-16 ENCOUNTER — Encounter: Payer: Self-pay | Admitting: Family Medicine

## 2017-07-10 ENCOUNTER — Encounter: Payer: Self-pay | Admitting: Family Medicine

## 2017-07-10 ENCOUNTER — Ambulatory Visit (INDEPENDENT_AMBULATORY_CARE_PROVIDER_SITE_OTHER): Payer: PPO | Admitting: Family Medicine

## 2017-07-10 VITALS — BP 122/78 | Ht 66.0 in | Wt 135.0 lb

## 2017-07-10 DIAGNOSIS — I10 Essential (primary) hypertension: Secondary | ICD-10-CM

## 2017-07-10 DIAGNOSIS — Z780 Asymptomatic menopausal state: Secondary | ICD-10-CM | POA: Diagnosis not present

## 2017-07-10 DIAGNOSIS — Z79891 Long term (current) use of opiate analgesic: Secondary | ICD-10-CM

## 2017-07-10 DIAGNOSIS — E7849 Other hyperlipidemia: Secondary | ICD-10-CM

## 2017-07-10 MED ORDER — PANTOPRAZOLE SODIUM 40 MG PO TBEC: 40.0000 mg | DELAYED_RELEASE_TABLET | Freq: Every day | ORAL | 2 refills | Status: DC

## 2017-07-10 MED ORDER — OXYCODONE-ACETAMINOPHEN 5-325 MG PO TABS: ORAL_TABLET | ORAL | 0 refills | Status: DC

## 2017-07-10 NOTE — Progress Notes (Signed)
Subjective:    Patient ID: Terri Harris, female    DOB: 13-Apr-1941, 77 y.o.   MRN: [id number]  HPI This patient was seen today for chronic pain Patient with significant midthoracic pain discomfort severe enough in the late evening where she has to take an oxycodone she does not want to take oxycodone during the day but occasionally has to she denies misusing medicine denies abusing the medicine she does have osteoporosis she is under treatment for this she has chronic low back pain with previous surgery and chronic mid back pain The medication list was reviewed and updated.   -Compliance with medication: Yes  - Number patient states they take daily: One po QHS  -when was the last dose patient took? Last night  The patient was advised the importance of maintaining medication and not using illegal substances with these.  Here for refills and follow up  The patient was educated that we can provide 3 monthly scripts for their medication, it is their responsibility to follow the instructions.  Side effects or complications from medications: None  Patient is aware that pain medications are meant to minimize the severity of the pain to allow their pain levels to improve to allow for better function. They are aware of that pain medications cannot totally remove their pain.  Due for UDT ( at least once per year) : 07/10/2017  Patient for blood pressure check up. Patient relates compliance with meds. Todays BP reviewed with the patient. Patient denies issues with medication. Patient relates reasonable diet. Patient tries to minimize salt. Patient aware of BP goals.  Patient here for follow-up regarding cholesterol.  Patient does try to maintain a reasonable diet.  Patient does take the medication on a regular basis.  Denies missing a dose.  The patient denies any obvious side effects.  Prior blood work results reviewed with the patient.  The patient is aware of his cholesterol goals and the  need to keep it under good control to lessen the risk of disease.      Review of Systems  Constitutional: Negative for activity change, fatigue and fever.  HENT: Negative for congestion.   Respiratory: Negative for cough, chest tightness and shortness of breath.   Cardiovascular: Negative for chest pain and leg swelling.  Gastrointestinal: Negative for abdominal pain.  Skin: Negative for color change.  Neurological: Negative for headaches.  Psychiatric/Behavioral: Negative for behavioral problems.       Objective:   Physical Exam  Constitutional: She appears well-nourished. No distress.  HENT:  Head: Normocephalic.  Right Ear: External ear normal.  Left Ear: External ear normal.  Eyes: Right eye exhibits no discharge. Left eye exhibits no discharge.  Neck: No tracheal deviation present.  Cardiovascular: Normal rate, regular rhythm and normal heart sounds.  No murmur heard. Pulmonary/Chest: Effort normal and breath sounds normal. No respiratory distress. She has no wheezes. She has no rales.  Musculoskeletal: She exhibits no edema.  Lymphadenopathy:    She has no cervical adenopathy.  Neurological: She is alert.  Psychiatric: Her behavior is normal.  Vitals reviewed.         Assessment & Plan:  The patient was seen today as part of a comprehensive visit regarding pain control. Patient's compliance with the medication as well as discussion regarding effectiveness was completed. Prescriptions were written. Patient was advised to follow-up in 3 months. The patient was assessed for any signs of severe side effects. The patient was advised to take the medicine as  directed and to report to [place] if any side effect issues.  HTN- Patient was seen today as part of a visit regarding hypertension. The importance of healthy diet and regular physical activity was discussed. The importance of compliance with medications discussed. Ideal goal is to keep blood pressure low elevated levels  certainly below 026/37 when possible. The patient was counseled that keeping blood pressure under control lessen his risk of heart attack, stroke, kidney failure, and early death. The importance of regular follow-ups was discussed with the patient. Low-salt diet such as DASH recommended. Regular physical activity was recommended as well. Patient was advised to keep regular follow-ups.  Follow-up within 3 months when she is getting toward the end of her last pain prescription drug registry was checked

## 2017-07-10 NOTE — Patient Instructions (Signed)

## 2017-07-11 DIAGNOSIS — E7849 Other hyperlipidemia: Secondary | ICD-10-CM | POA: Diagnosis not present

## 2017-07-12 ENCOUNTER — Encounter: Payer: Self-pay | Admitting: Family Medicine

## 2017-07-12 LAB — LIPID PANEL
Chol/HDL Ratio: 2.5 ratio (ref 0.0–4.4)
Cholesterol, Total: 181 mg/dL (ref 100–199)
HDL: 72 mg/dL (ref >39)
LDL Calculated: 92 mg/dL (ref 0–99)
Triglycerides: 86 mg/dL (ref 0–149)
VLDL Cholesterol Cal: 17 mg/dL (ref 5–40)

## 2017-07-15 LAB — TOXASSURE SELECT 13 (MW), URINE

## 2017-07-15 LAB — SPECIMEN STATUS REPORT

## 2017-07-30 DIAGNOSIS — H02883 Meibomian gland dysfunction of right eye, unspecified eyelid: Secondary | ICD-10-CM | POA: Diagnosis not present

## 2017-07-30 DIAGNOSIS — H02886 Meibomian gland dysfunction of left eye, unspecified eyelid: Secondary | ICD-10-CM | POA: Diagnosis not present

## 2017-07-30 DIAGNOSIS — H1852 Epithelial (juvenile) corneal dystrophy: Secondary | ICD-10-CM | POA: Diagnosis not present

## 2017-08-08 ENCOUNTER — Other Ambulatory Visit (HOSPITAL_COMMUNITY): Payer: Self-pay | Admitting: Neurological Surgery

## 2017-08-08 ENCOUNTER — Other Ambulatory Visit: Payer: Self-pay | Admitting: Neurological Surgery

## 2017-08-08 DIAGNOSIS — M48061 Spinal stenosis, lumbar region without neurogenic claudication: Secondary | ICD-10-CM

## 2017-08-22 ENCOUNTER — Other Ambulatory Visit (HOSPITAL_COMMUNITY): Payer: PPO

## 2017-08-22 ENCOUNTER — Ambulatory Visit (HOSPITAL_COMMUNITY): Payer: PPO

## 2017-08-27 ENCOUNTER — Ambulatory Visit (HOSPITAL_COMMUNITY): Admission: RE | Admit: 2017-08-27 | Payer: PPO | Source: Ambulatory Visit

## 2017-09-18 ENCOUNTER — Ambulatory Visit (HOSPITAL_COMMUNITY)
Admission: RE | Admit: 2017-09-18 | Discharge: 2017-09-18 | Disposition: A | Discharge status: Home / Self Care | Payer: PPO | Source: Ambulatory Visit | Attending: Neurological Surgery | Admitting: Neurological Surgery

## 2017-09-18 DIAGNOSIS — M48061 Spinal stenosis, lumbar region without neurogenic claudication: Secondary | ICD-10-CM | POA: Diagnosis not present

## 2017-09-18 DIAGNOSIS — M4306 Spondylolysis, lumbar region: Secondary | ICD-10-CM | POA: Diagnosis not present

## 2017-09-18 DIAGNOSIS — Z981 Arthrodesis status: Secondary | ICD-10-CM | POA: Insufficient documentation

## 2017-09-18 DIAGNOSIS — M5136 Other intervertebral disc degeneration, lumbar region: Secondary | ICD-10-CM | POA: Insufficient documentation

## 2017-09-18 DIAGNOSIS — M4807 Spinal stenosis, lumbosacral region: Secondary | ICD-10-CM | POA: Insufficient documentation

## 2017-09-18 DIAGNOSIS — M5137 Other intervertebral disc degeneration, lumbosacral region: Secondary | ICD-10-CM | POA: Insufficient documentation

## 2017-09-18 MED ORDER — LIDOCAINE HCL (PF) 1 % IJ SOLN
INTRAMUSCULAR | Status: AC
  Administered 2017-09-18: 5 mL via INTRADERMAL
  Filled 2017-09-18: qty 5

## 2017-09-18 MED ORDER — OXYCODONE-ACETAMINOPHEN 5-325 MG PO TABS
ORAL_TABLET | ORAL | Status: AC
  Filled 2017-09-18: qty 1

## 2017-09-18 MED ORDER — DIAZEPAM 5 MG PO TABS
10.0000 mg | ORAL_TABLET | Freq: Once | ORAL | Status: AC
  Administered 2017-09-18: 10 mg via ORAL

## 2017-09-18 MED ORDER — IOPAMIDOL (ISOVUE-M 200) INJECTION 41%
20.0000 mL | Freq: Once | INTRAMUSCULAR | Status: AC
  Administered 2017-09-18: 10 mL via INTRATHECAL

## 2017-09-18 MED ORDER — IOPAMIDOL (ISOVUE-M 200) INJECTION 41%
INTRAMUSCULAR | Status: AC
  Administered 2017-09-18: 10 mL via INTRATHECAL
  Filled 2017-09-18: qty 10

## 2017-09-18 MED ORDER — ONDANSETRON HCL 4 MG/2ML IJ SOLN: 4.0000 mg | Freq: Four times a day (QID) | INTRAMUSCULAR | Status: DC | PRN

## 2017-09-18 MED ORDER — LIDOCAINE HCL (PF) 1 % IJ SOLN
5.0000 mL | Freq: Once | INTRAMUSCULAR | Status: AC
  Administered 2017-09-18: 5 mL via INTRADERMAL

## 2017-09-18 MED ORDER — OXYCODONE-ACETAMINOPHEN 5-325 MG PO TABS
1.0000 | ORAL_TABLET | ORAL | Status: DC | PRN
  Administered 2017-09-18: 1 via ORAL

## 2017-09-18 MED ORDER — DIAZEPAM 5 MG PO TABS
ORAL_TABLET | ORAL | Status: AC
  Filled 2017-09-18: qty 2

## 2017-09-18 NOTE — Discharge Instructions (Signed)
**Note Amani Marseille-identified via Obfuscation** Myelogram, Care After °These instructions give you information about caring for yourself after your procedure. Your doctor may also give you more specific instructions. Call your doctor if you have any problems or questions after your procedure. °Follow these instructions at home: °· Drink enough fluid to keep your pee (urine) clear or pale yellow. °· Rest as told by your doctor. °· Lie flat with your head slightly raised (elevated). °· Do not bend, lift, or do any hard activities for 24-48 hours or as told by your doctor. °· Take over-the-counter and prescription medicines only as told by your doctor. °· Take care of and remove your bandage (dressing) as told by your doctor. °· Bathe or shower as told by your doctor. °Contact a health care provider if: °· You have a fever. °· You have a headache that lasts longer than 24 hours. °· You feel sick to your stomach (nauseous). °· You throw up (vomit). °· Your neck is stiff. °· Your legs feel numb. °· You cannot pee. °· You cannot poop (have a bowel movement). °· You have a rash. °· You are itchy or sneezing. °Get help right away if: °· You have new symptoms or your symptoms get worse. °· You have a seizure. °· You have trouble breathing. °This information is not intended to replace advice given to you by your health care provider. Make sure you discuss any questions you have with your health care provider. °Document Released: 02/22/2008 Document Revised: 01/13/2016 Document Reviewed: 02/25/2015 °Elsevier Interactive Patient Education © 2018 [employer] ° °

## 2017-09-18 NOTE — Procedures (Signed)
Patient is a 77 year old individuals had significant previous lumbar decompression and stabilization for severe spondylitic degenerative changes with stenosis.  She now has low back pain with radicular symptoms and symptoms of neurogenic claudication.  She has been advised regarding the need for a repeat myelogram at this time.  Pre op Dx: Lumbar myelogram Post op Dx: Same Procedure: Lumbar myelogram Surgeon: Yaslin Kirtley Puncture level: L3-4 Fluid color: Clear colorless Injection: Isovue-200, 12 mL Findings: Severe spondylitic degenerative changes at L5-S1.  Further workup with CT

## 2017-09-19 DIAGNOSIS — M4714 Other spondylosis with myelopathy, thoracic region: Secondary | ICD-10-CM | POA: Diagnosis not present

## 2017-10-08 ENCOUNTER — Encounter: Payer: Self-pay | Admitting: Family Medicine

## 2017-10-08 ENCOUNTER — Ambulatory Visit (INDEPENDENT_AMBULATORY_CARE_PROVIDER_SITE_OTHER): Payer: PPO | Admitting: Family Medicine

## 2017-10-08 VITALS — BP 128/72 | Ht 65.0 in | Wt 135.0 lb

## 2017-10-08 DIAGNOSIS — Z79891 Long term (current) use of opiate analgesic: Secondary | ICD-10-CM | POA: Diagnosis not present

## 2017-10-08 DIAGNOSIS — I10 Essential (primary) hypertension: Secondary | ICD-10-CM

## 2017-10-08 MED ORDER — OXYCODONE-ACETAMINOPHEN 5-325 MG PO TABS: ORAL_TABLET | ORAL | 0 refills | Status: DC

## 2017-10-08 MED ORDER — AMLODIPINE BESYLATE 5 MG PO TABS: 5.0000 mg | ORAL_TABLET | Freq: Every day | ORAL | 1 refills | Status: DC

## 2017-10-08 NOTE — Progress Notes (Signed)
   Subjective:    Patient ID: Terri Harris, female    DOB: April 26, 1941, 77 y.o.   MRN: [id number]  HPI This patient was seen today for chronic pain  The medication list was reviewed and updated.   -Compliance with medication: yes  - Number patient states they take daily: mostly 1, sometimes 2  -when was the last dose patient took? Last night about 5pm  The patient was advised the importance of maintaining medication and not using illegal substances with these.  Here for refills and follow up  The patient was educated that we can provide 3 monthly scripts for their medication, it is their responsibility to follow the instructions.  Side effects or complications from medications: none  Patient is aware that pain medications are meant to minimize the severity of the pain to allow their pain levels to improve to allow for better function. They are aware of that pain medications cannot totally remove their pain.  Due for UDT ( at least once per year) : 07/10/17  Has ongoing low back pain and discomfort.  Will be getting an injection from specialist coming up takes anywhere between 1 or 2 tablets a day helps her function denies abusing the medicine.     Review of Systems  Constitutional: Negative for activity change and appetite change.  HENT: Negative for congestion.   Respiratory: Negative for cough.   Cardiovascular: Negative for chest pain.  Gastrointestinal: Negative for abdominal pain and vomiting.  Skin: Negative for color change.  Neurological: Negative for weakness.  Psychiatric/Behavioral: Negative for confusion.       Objective:   Physical Exam  Constitutional: She appears well-nourished. No distress.  HENT:  Head: Normocephalic.  Cardiovascular: Normal rate, regular rhythm and normal heart sounds.  No murmur heard. Pulmonary/Chest: Effort normal and breath sounds normal.  Musculoskeletal: She exhibits no edema.  Lymphadenopathy:    She has no cervical  adenopathy.  Neurological: She is alert.  Psychiatric: Her behavior is normal.  Vitals reviewed.         Assessment & Plan:  The patient was seen in followup for chronic pain. A review over at their current pain status was discussed. Discussion was held regarding the importance of compliance with medication as well as pain medication contract. Discussion with the patient regarding the medication as it pertains to allowing for blunting of pain levels as well as improvement of function was completed. Questions regarding the pain management were answered. Importance of regular followup visits was discussed. Patient was informed that medication may cause drowsiness and should not be combined  with other medications/alcohol or street drugs. Patient was cautioned that drowsiness may impair ability to operate heavy machinery/vehicles/dangerous activities and should take this into consideration.

## 2017-10-12 ENCOUNTER — Ambulatory Visit: Payer: PPO | Admitting: Family Medicine

## 2017-10-23 DIAGNOSIS — M5417 Radiculopathy, lumbosacral region: Secondary | ICD-10-CM | POA: Diagnosis not present

## 2017-10-23 DIAGNOSIS — M5416 Radiculopathy, lumbar region: Secondary | ICD-10-CM | POA: Diagnosis not present

## 2018-01-08 ENCOUNTER — Encounter: Payer: Self-pay | Admitting: Family Medicine

## 2018-01-08 ENCOUNTER — Ambulatory Visit (INDEPENDENT_AMBULATORY_CARE_PROVIDER_SITE_OTHER): Payer: PPO | Admitting: Family Medicine

## 2018-01-08 VITALS — BP 120/78 | Temp 98.2°F | Wt 128.2 lb

## 2018-01-08 DIAGNOSIS — E7849 Other hyperlipidemia: Secondary | ICD-10-CM | POA: Diagnosis not present

## 2018-01-08 DIAGNOSIS — Z79891 Long term (current) use of opiate analgesic: Secondary | ICD-10-CM

## 2018-01-08 DIAGNOSIS — I10 Essential (primary) hypertension: Secondary | ICD-10-CM | POA: Diagnosis not present

## 2018-01-08 MED ORDER — OXYCODONE-ACETAMINOPHEN 5-325 MG PO TABS: ORAL_TABLET | ORAL | 0 refills | Status: DC

## 2018-01-08 NOTE — Progress Notes (Signed)
Subjective:    Patient ID: Terri Harris, female    DOB: 1941/02/01, 77 y.o.   MRN: [id number]  HPI This patient was seen today for chronic pain  The medication list was reviewed and updated.   -Compliance with medication: She relates she takes her pain medicine to help bring the edge off the pain that allows her to be more functional  - Number patient states they take daily: She takes 1 or 2/day most days to she feels that she needs 2 pills/day  -when was the last dose patient took?  She took one earlier today  The patient was advised the importance of maintaining medication and not using illegal substances with these.  Here for refills and follow up  The patient was educated that we can provide 3 monthly scripts for their medication, it is their responsibility to follow the instructions.  Side effects or complications from medications: She denies any side effects does not cause drowsiness or dizziness  Patient is aware that pain medications are meant to minimize the severity of the pain to allow their pain levels to improve to allow for better function. They are aware of that pain medications cannot totally remove their pain.  Due for UDT ( at least once per year) : She is up-to-date on this  She did try an injection in her back but unfortunately it did not help her enough       Review of Systems  Constitutional: Negative for activity change, appetite change and fatigue.  HENT: Negative for congestion and rhinorrhea.   Respiratory: Negative for cough and shortness of breath.   Cardiovascular: Negative for chest pain and leg swelling.  Gastrointestinal: Negative for abdominal pain and diarrhea.  Endocrine: Negative for polydipsia and polyphagia.  Skin: Negative for color change.  Neurological: Negative for dizziness and weakness.  Psychiatric/Behavioral: Negative for behavioral problems and confusion.       Objective:   Physical Exam  Constitutional: She appears  well-nourished. No distress.  HENT:  Head: Normocephalic and atraumatic.  Eyes: Right eye exhibits no discharge. Left eye exhibits no discharge.  Neck: No tracheal deviation present.  Cardiovascular: Normal rate, regular rhythm and normal heart sounds.  No murmur heard. Pulmonary/Chest: Effort normal and breath sounds normal. No respiratory distress.  Musculoskeletal: She exhibits no edema.  Lymphadenopathy:    She has no cervical adenopathy.  Neurological: She is alert. Coordination normal.  Skin: Skin is warm and dry.  Psychiatric: She has a normal mood and affect. Her behavior is normal.  Vitals reviewed.        15 minutes was spent with patient today discussing healthcare issues which they came.  More than 50% of this visit-total duration of visit-was spent in counseling and coordination of care.  Please see diagnosis regarding the focus of this coordination and care  Assessment & Plan:  Significant lumbar pain 3 prescriptions home pain medicine given Lab work recommended before next visit  The patient was seen in followup for chronic pain. A review over at their current pain status was discussed. Drug registry was checked. Prescriptions were given. Discussion was held regarding the importance of compliance with medication as well as pain medication contract.  Time for questions regarding pain management plan occurred. Importance of regular followup visits was discussed. Patient was informed that medication may cause drowsiness and should not be combined  with other medications/alcohol or street drugs. Patient was cautioned that medication could cause drowsiness. If the patient feels medication is  causing altered alertness then do not drive or operate dangerous equipment.

## 2018-04-01 ENCOUNTER — Other Ambulatory Visit: Payer: Self-pay | Admitting: Family Medicine

## 2018-04-02 DIAGNOSIS — E7849 Other hyperlipidemia: Secondary | ICD-10-CM | POA: Diagnosis not present

## 2018-04-02 DIAGNOSIS — I10 Essential (primary) hypertension: Secondary | ICD-10-CM | POA: Diagnosis not present

## 2018-04-03 LAB — BASIC METABOLIC PANEL
BUN/Creatinine Ratio: 13 (ref 12–28)
BUN: 8 mg/dL (ref 8–27)
CO2: 23 mmol/L (ref 20–29)
Calcium: 9.3 mg/dL (ref 8.7–10.3)
Chloride: 102 mmol/L (ref 96–106)
Creatinine, Ser: 0.61 mg/dL (ref 0.57–1.00)
GFR calc Af Amer: 102 mL/min/{1.73_m2} (ref >59)
GFR calc non Af Amer: 88 mL/min/{1.73_m2} (ref >59)
Glucose: 91 mg/dL (ref 65–99)
Potassium: 3.9 mmol/L (ref 3.5–5.2)
Sodium: 144 mmol/L (ref 134–144)

## 2018-04-03 LAB — LIPID PANEL
Chol/HDL Ratio: 2.6 ratio (ref 0.0–4.4)
Cholesterol, Total: 183 mg/dL (ref 100–199)
HDL: 70 mg/dL (ref >39)
LDL Calculated: 93 mg/dL (ref 0–99)
Triglycerides: 100 mg/dL (ref 0–149)
VLDL Cholesterol Cal: 20 mg/dL (ref 5–40)

## 2018-04-10 ENCOUNTER — Ambulatory Visit (INDEPENDENT_AMBULATORY_CARE_PROVIDER_SITE_OTHER): Payer: PPO | Admitting: Family Medicine

## 2018-04-10 ENCOUNTER — Encounter: Payer: Self-pay | Admitting: Family Medicine

## 2018-04-10 VITALS — BP 114/70 | Ht 65.0 in | Wt 131.0 lb

## 2018-04-10 DIAGNOSIS — I10 Essential (primary) hypertension: Secondary | ICD-10-CM

## 2018-04-10 DIAGNOSIS — M545 Low back pain, unspecified: Secondary | ICD-10-CM

## 2018-04-10 DIAGNOSIS — G8929 Other chronic pain: Secondary | ICD-10-CM

## 2018-04-10 DIAGNOSIS — Z23 Encounter for immunization: Secondary | ICD-10-CM | POA: Diagnosis not present

## 2018-04-10 MED ORDER — ZOSTER VAC RECOMB ADJUVANTED 50 MCG/0.5ML IM SUSR: 0.5000 mL | Freq: Once | INTRAMUSCULAR | 1 refills | Status: AC

## 2018-04-10 MED ORDER — ZOSTER VAC RECOMB ADJUVANTED 50 MCG/0.5ML IM SUSR: 0.5000 mL | Freq: Once | INTRAMUSCULAR | 1 refills | Status: DC

## 2018-04-10 MED ORDER — AMLODIPINE BESYLATE 5 MG PO TABS: 5.0000 mg | ORAL_TABLET | Freq: Every day | ORAL | 1 refills | Status: DC

## 2018-04-10 MED ORDER — OXYCODONE-ACETAMINOPHEN 5-325 MG PO TABS: ORAL_TABLET | ORAL | 0 refills | Status: DC

## 2018-04-10 NOTE — Patient Instructions (Addendum)
Results for orders placed or performed in visit on 01/08/18  Lipid Profile  Result Value Ref Range   Cholesterol, Total 183 100 - 199 mg/dL   Triglycerides 100 0 - 149 mg/dL   HDL 70 >39 mg/dL   VLDL Cholesterol Cal 20 5 - 40 mg/dL   LDL Calculated 93 0 - 99 mg/dL   Chol/HDL Ratio 2.6 0.0 - 4.4 ratio  Basic Metabolic Panel (BMET)  Result Value Ref Range   Glucose 91 65 - 99 mg/dL   BUN 8 8 - 27 mg/dL   Creatinine, Ser 0.61 0.57 - 1.00 mg/dL   GFR calc non Af Amer 88 >59 mL/min/1.73   GFR calc Af Amer 102 >59 mL/min/1.73   BUN/Creatinine Ratio 13 12 - 28   Sodium 144 134 - 144 mmol/L   Potassium 3.9 3.5 - 5.2 mmol/L   Chloride 102 96 - 106 mmol/L   CO2 23 20 - 29 mmol/L   Calcium 9.3 8.7 - 10.3 mg/dL   Shingrix and shingles prevention: know the facts!   Shingrix is a very effective vaccine to prevent shingles.   Shingles is a reactivation of chickenpox -more than 99% of Americans born before 1980 have had chickenpox even if they do not remember it. One in every 10 people who get shingles have severe long-lasting nerve pain as a result.   33 out of a 100 older adults will get shingles if they are unvaccinated.     This vaccine is very important for your health This vaccine is indicated for anyone 50 years or older. You can get this vaccine even if you have already had shingles because you can get the disease more than once in a lifetime.  Your risk for shingles and its complications increases with age.  This vaccine has 2 doses.  The second dose would be 2 to 6 months after the first dose.  If you had Zostavax vaccine in the past you should still get Shingrix. ( Zostavax is only 70% effective and it loses significant strength over a few years .)  This vaccine is given through the pharmacy.  The cost of the vaccine is through your insurance. The pharmacy can inform you of the total costs.  Common side effects including soreness in the arm, some redness and swelling, also  some feel fatigue muscle soreness headache low-grade fever.  Side effects typically go away within 2 to 3 days. Remember-the pain from shingles can last a lifetime but these side effects of the vaccine will only last a few days at most. It is very important to get both doses in order to protect yourself fully.   Please get this vaccine at your earliest convenience at your trusted pharmacy.

## 2018-04-10 NOTE — Progress Notes (Signed)
   Subjective:    Patient ID: Terri Harris, female    DOB: 1941-01-25, 77 y.o.   MRN: [id number]  HPI This patient was seen today for chronic pain  The medication list was reviewed and updated.   -Compliance with medication: Yes  - Number patient states they take daily: 1-2 per day  -when was the last dose patient took? Last night at 7 pm.  The patient was advised the importance of maintaining medication and not using illegal substances with these.  Here for refills and follow up  The patient was educated that we can provide 3 monthly scripts for their medication, it is their responsibility to follow the instructions.  Side effects or complications from medications: None  Patient is aware that pain medications are meant to minimize the severity of the pain to allow their pain levels to improve to allow for better function. They are aware of that pain medications cannot totally remove their pain.  Due for UDT ( at least once per year) : 06/2018    Pain medication profile was reviewed in detail  Patient states she has low back pain on the right side does not radiate down the legs pain medicine does help she does not abuse of medicine does not make her drowsy she does not want to have injections or surgery Review of Systems  Constitutional: Negative for activity change and appetite change.  HENT: Negative for congestion and rhinorrhea.   Respiratory: Negative for cough and shortness of breath.   Cardiovascular: Negative for chest pain and leg swelling.  Gastrointestinal: Negative for abdominal pain, nausea and vomiting.  Skin: Negative for color change.  Neurological: Negative for dizziness and weakness.  Psychiatric/Behavioral: Negative for agitation and confusion.       Objective:   Physical Exam  Constitutional: She appears well-nourished. No distress.  HENT:  Head: Normocephalic and atraumatic.  Eyes: Right eye exhibits no discharge. Left eye exhibits no discharge.    Neck: No tracheal deviation present.  Cardiovascular: Normal rate, regular rhythm and normal heart sounds.  No murmur heard. Pulmonary/Chest: Effort normal and breath sounds normal. No respiratory distress.  Musculoskeletal: She exhibits no edema.  Lymphadenopathy:    She has no cervical adenopathy.  Neurological: She is alert. Coordination normal.  Skin: Skin is warm and dry.  Psychiatric: She has a normal mood and affect. Her behavior is normal.  Vitals reviewed.         Assessment & Plan:  The patient was seen in followup for chronic pain. A review over at their current pain status was discussed. Drug registry was checked. Prescriptions were given. Discussion was held regarding the importance of compliance with medication as well as pain medication contract.  Time for questions regarding pain management plan occurred. Importance of regular followup visits was discussed. Patient was informed that medication may cause drowsiness and should not be combined  with other medications/alcohol or street drugs. Patient was cautioned that medication could cause drowsiness. If the patient feels medication is causing altered alertness then do not drive or operate dangerous equipment.  Drug registry checked prescriptions were sent in

## 2018-05-15 ENCOUNTER — Encounter (HOSPITAL_COMMUNITY)
Admission: RE | Admit: 2018-05-15 | Discharge: 2018-05-15 | Disposition: A | Discharge status: Home / Self Care | Payer: PPO | Source: Ambulatory Visit | Attending: Family Medicine | Admitting: Family Medicine

## 2018-05-15 ENCOUNTER — Encounter (HOSPITAL_COMMUNITY): Payer: Self-pay

## 2018-05-15 DIAGNOSIS — M810 Age-related osteoporosis without current pathological fracture: Secondary | ICD-10-CM | POA: Insufficient documentation

## 2018-05-15 MED ORDER — SODIUM CHLORIDE 0.9 % IV SOLN
Freq: Once | INTRAVENOUS | Status: AC
  Administered 2018-05-15: 11:00:00 via INTRAVENOUS

## 2018-05-15 MED ORDER — ZOLEDRONIC ACID 5 MG/100ML IV SOLN
5.0000 mg | Freq: Once | INTRAVENOUS | Status: AC
  Administered 2018-05-15: 5 mg via INTRAVENOUS

## 2018-07-11 ENCOUNTER — Ambulatory Visit: Payer: PPO | Admitting: Family Medicine

## 2018-07-12 ENCOUNTER — Ambulatory Visit (INDEPENDENT_AMBULATORY_CARE_PROVIDER_SITE_OTHER): Payer: PPO | Admitting: Family Medicine

## 2018-07-12 ENCOUNTER — Encounter: Payer: Self-pay | Admitting: Family Medicine

## 2018-07-12 VITALS — BP 128/82 | Ht 65.0 in | Wt 131.6 lb

## 2018-07-12 DIAGNOSIS — Z79891 Long term (current) use of opiate analgesic: Secondary | ICD-10-CM | POA: Diagnosis not present

## 2018-07-12 MED ORDER — OXYCODONE-ACETAMINOPHEN 5-325 MG PO TABS: ORAL_TABLET | ORAL | 0 refills | Status: DC

## 2018-07-12 NOTE — Progress Notes (Signed)
   Subjective:    Patient ID: Terri Harris, female    DOB: [birth year], 78 y.o.   MRN: [id number]  HPI  This patient was seen today for chronic pain  The medication list was reviewed and updated.   -Compliance with medication: yes  - Number patient states they take daily: 2  -when was the last dose patient took? Last night  The patient was advised the importance of maintaining medication and not using illegal substances with these.  Here for refills and follow up  The patient was educated that we can provide 3 monthly scripts for their medication, it is their responsibility to follow the instructions.  Side effects or complications from medications: none  Patient is aware that pain medications are meant to minimize the severity of the pain to allow their pain levels to improve to allow for better function. They are aware of that pain medications cannot totally remove their pain.  Due for UDT ( at least once per year) : today       Review of Systems  Constitutional: Negative for activity change and appetite change.  HENT: Negative for congestion and rhinorrhea.   Respiratory: Negative for cough and shortness of breath.   Cardiovascular: Negative for chest pain and leg swelling.  Gastrointestinal: Negative for abdominal pain, nausea and vomiting.  Skin: Negative for color change.  Neurological: Negative for dizziness and weakness.  Psychiatric/Behavioral: Negative for agitation and confusion.       Objective:   Physical Exam Vitals signs reviewed.  Constitutional:      General: She is not in acute distress. HENT:     Head: Normocephalic and atraumatic.  Eyes:     General:        Right eye: No discharge.        Left eye: No discharge.  Neck:     Trachea: No tracheal deviation.  Cardiovascular:     Rate and Rhythm: Normal rate and regular rhythm.     Heart sounds: Normal heart sounds. No murmur.  Pulmonary:     Effort: Pulmonary effort is normal. No respiratory  distress.     Breath sounds: Normal breath sounds.  Lymphadenopathy:     Cervical: No cervical adenopathy.  Skin:    General: Skin is warm and dry.  Neurological:     Mental Status: She is alert.     Coordination: Coordination normal.  Psychiatric:        Behavior: Behavior normal.           Assessment & Plan:  Chronic low back pain and discomfort at this point time does not want to do any additional surgery.  Pain medicine does help her allows her to be functional therefore continue with pain medicine 3 scripts were sent in as well  Follow-up 3 months lab work in 3 months

## 2018-07-19 ENCOUNTER — Encounter: Payer: Self-pay | Admitting: Family Medicine

## 2018-07-19 LAB — TOXASSURE SELECT 13 (MW), URINE

## 2018-10-04 ENCOUNTER — Other Ambulatory Visit: Payer: Self-pay | Admitting: Family Medicine

## 2018-10-11 ENCOUNTER — Ambulatory Visit: Payer: PPO | Admitting: Family Medicine

## 2018-10-14 ENCOUNTER — Ambulatory Visit (INDEPENDENT_AMBULATORY_CARE_PROVIDER_SITE_OTHER): Payer: PPO | Admitting: Family Medicine

## 2018-10-14 ENCOUNTER — Other Ambulatory Visit: Payer: Self-pay

## 2018-10-14 DIAGNOSIS — Z79891 Long term (current) use of opiate analgesic: Secondary | ICD-10-CM | POA: Diagnosis not present

## 2018-10-14 DIAGNOSIS — G8929 Other chronic pain: Secondary | ICD-10-CM

## 2018-10-14 DIAGNOSIS — K219 Gastro-esophageal reflux disease without esophagitis: Secondary | ICD-10-CM | POA: Diagnosis not present

## 2018-10-14 DIAGNOSIS — I10 Essential (primary) hypertension: Secondary | ICD-10-CM | POA: Diagnosis not present

## 2018-10-14 DIAGNOSIS — M545 Low back pain, unspecified: Secondary | ICD-10-CM

## 2018-10-14 MED ORDER — OXYCODONE-ACETAMINOPHEN 5-325 MG PO TABS: ORAL_TABLET | ORAL | 0 refills | Status: DC

## 2018-10-14 NOTE — Progress Notes (Signed)
Subjective:    Patient ID: Terri Harris, female    DOB: [birth year], 78 y.o.   MRN: [id number]  HPI This patient was seen today for chronic pain  The medication list was reviewed and updated.   -Compliance with medication: oxycodone 5-325 one bid prn. Pt would like to increase how many pills she takes due to back pain  - Number patient states they take daily:  2-3  -when was the last dose patient took? Last night  The patient was advised the importance of maintaining medication and not using illegal substances with these.  Here for refills and follow up  The patient was educated that we can provide 3 monthly scripts for their medication, it is their responsibility to follow the instructions.  Side effects or complications from medications: none  Patient is aware that pain medications are meant to minimize the severity of the pain to allow their pain levels to improve to allow for better function. They are aware of that pain medications cannot totally remove their pain.  Due for UDT ( at least once per year) :    Virtual Visit via Video Note  I connected with [name] on 10/14/18 at 10:00 AM EDT by a video enabled telemedicine application and verified that I am speaking with the correct person using two identifiers.  Location: Patient: home Provider: office   I discussed the limitations of evaluation and management by telemedicine and the availability of in person appointments. The patient expressed understanding and agreed to proceed.  History of Present Illness:    Observations/Objective:   Assessment and Plan:   Follow Up Instructions:    I discussed the assessment and treatment plan with the patient. The patient was provided an opportunity to ask questions and all were answered. The patient agreed with the plan and demonstrated an understanding of the instructions.   The patient was advised to call back or seek an in-person evaluation if the symptoms  worsen or if the condition fails to improve as anticipated.  I provided 15 minutes of non-face-to-face time during this encounter.   Vicente Males, LPN     Review of Systems  Constitutional: Negative for activity change and appetite change.  HENT: Negative for congestion and rhinorrhea.   Respiratory: Negative for cough and shortness of breath.   Cardiovascular: Negative for chest pain and leg swelling.  Gastrointestinal: Negative for abdominal pain, nausea and vomiting.  Skin: Negative for color change.  Neurological: Negative for dizziness and weakness.  Psychiatric/Behavioral: Negative for agitation and confusion.       Objective:   Physical Exam        Assessment & Plan:  HTN- Patient was seen today as part of a visit regarding hypertension. The importance of healthy diet and regular physical activity was discussed. The importance of compliance with medications discussed.  Ideal goal is to keep blood pressure low elevated levels certainly below 440/10 when possible.  The patient was counseled that keeping blood pressure under control lessen his risk of complications.  The importance of regular follow-ups was discussed with the patient.  Low-salt diet such as DASH recommended.  Regular physical activity was recommended as well.  Patient was advised to keep regular follow-ups.  The patient was seen in followup for chronic pain. A review over at their current pain status was discussed. Drug registry was checked. Prescriptions were given. Discussion was held regarding the importance of compliance with medication as well as pain medication contract.  Time for questions regarding pain management plan occurred. Importance of regular followup visits was discussed. Patient was informed that medication may cause drowsiness and should not be combined  with other medications/alcohol or street drugs. Patient was cautioned that medication could cause drowsiness. If the patient  feels medication is causing altered alertness then do not drive or operate dangerous equipment.  She states she will get in with the specialist later in the year for her back

## 2018-12-06 DIAGNOSIS — I10 Essential (primary) hypertension: Secondary | ICD-10-CM | POA: Diagnosis not present

## 2018-12-06 DIAGNOSIS — S32009K Unspecified fracture of unspecified lumbar vertebra, subsequent encounter for fracture with nonunion: Secondary | ICD-10-CM | POA: Diagnosis not present

## 2018-12-13 ENCOUNTER — Other Ambulatory Visit: Payer: Self-pay | Admitting: Neurological Surgery

## 2018-12-13 DIAGNOSIS — H26493 Other secondary cataract, bilateral: Secondary | ICD-10-CM | POA: Diagnosis not present

## 2018-12-13 DIAGNOSIS — T1502XA Foreign body in cornea, left eye, initial encounter: Secondary | ICD-10-CM | POA: Diagnosis not present

## 2018-12-13 DIAGNOSIS — S32009K Unspecified fracture of unspecified lumbar vertebra, subsequent encounter for fracture with nonunion: Secondary | ICD-10-CM

## 2018-12-24 ENCOUNTER — Ambulatory Visit
Admission: RE | Admit: 2018-12-24 | Discharge: 2018-12-24 | Disposition: A | Discharge status: Home / Self Care | Payer: PPO | Source: Ambulatory Visit | Attending: Neurological Surgery | Admitting: Neurological Surgery

## 2018-12-24 ENCOUNTER — Other Ambulatory Visit: Payer: Self-pay

## 2018-12-24 DIAGNOSIS — S32009K Unspecified fracture of unspecified lumbar vertebra, subsequent encounter for fracture with nonunion: Secondary | ICD-10-CM

## 2018-12-24 DIAGNOSIS — M545 Low back pain: Secondary | ICD-10-CM | POA: Diagnosis not present

## 2018-12-26 DIAGNOSIS — M48061 Spinal stenosis, lumbar region without neurogenic claudication: Secondary | ICD-10-CM | POA: Diagnosis not present

## 2018-12-29 ENCOUNTER — Other Ambulatory Visit: Payer: Self-pay | Admitting: Family Medicine

## 2018-12-31 ENCOUNTER — Other Ambulatory Visit: Payer: Self-pay | Admitting: Neurological Surgery

## 2019-01-02 NOTE — Pre-Procedure Instructions (Signed)
Terri Harris  01/02/2019      [id number] - [place], [place] - [address] [place] [address] Phone: [phone] Fax: [phone]    Your procedure is scheduled on January 07, 2019.  Report to [facility] Admitting at 930 AM.  Call this number if you have problems the morning of surgery:  [phone]   Call [phone] if you have any questions prior to your surgery date Monday-Friday 8am-4pm    Remember:  Do not eat or drink after midnight.    Take these medicines the morning of surgery with A SIP OF WATER  Amlodipine (Norvasc) Pantoprazole (Protonix) Oxycodone-Acetaminophen (Percocet)-if needed Tylenol-if needed  Beginning now, STOP taking any Aspirin (unless otherwise instructed by your surgeon), Aleve, Naproxen, Ibuprofen, Motrin, Advil, Goody's, BC's, all herbal medications, fish oil, and all vitamins    Day of surgery:  Do not wear jewelry, make-up or nail polish.  Do not wear lotions, powders, or perfumes, or deodorant.  Do not shave 48 hours prior to surgery.    Do not bring valuables to the hospital.   [facility] is not responsible for any belongings or valuables.  IF you are a smoker, DO NOT Smoke 24 hours prior to surgery   IF you wear a CPAP at night please bring your mask, tubing, and machine the morning of surgery    Remember that you must have someone to transport you home after your surgery, and remain with you for 24 hours if you are discharged the same day.  Contacts, dentures or bridgework may not be worn into surgery.  Leave your suitcase in the car.  After surgery it may be brought to your room.  For patients admitted to the hospital, discharge time will be determined by your treatment team.  Patients discharged the day of surgery will not be allowed to drive home.   - Preparing For Surgery  Before surgery, you can play an important role. Because skin is not sterile, your  skin needs to be as free of germs as possible. You can reduce the number of germs on your skin by washing with CHG (chlorahexidine gluconate) Soap before surgery.  CHG is an antiseptic cleaner which kills germs and bonds with the skin to continue killing germs even after washing.    Oral Hygiene is also important to reduce your risk of infection.  Remember - BRUSH YOUR TEETH THE MORNING OF SURGERY WITH YOUR REGULAR TOOTHPASTE  Please do not use if you have an allergy to CHG or antibacterial soaps. If your skin becomes reddened/irritated stop using the CHG.  Do not shave (including legs and underarms) for at least 48 hours prior to first CHG shower. It is OK to shave your face.  Please follow these instructions carefully.   1. Shower the NIGHT BEFORE SURGERY and the MORNING OF SURGERY with CHG.   2. If you chose to wash your hair, wash your hair first as usual with your normal shampoo.  3. After you shampoo, rinse your hair and body thoroughly to remove the shampoo.  4. Use CHG as you would any other liquid soap. You can apply CHG directly to the skin and wash gently with a scrungie or a clean washcloth.   5. Apply the CHG Soap to your body ONLY FROM THE NECK DOWN.  Do not use on open wounds or open sores. Avoid contact with your eyes, ears, mouth and genitals (private  parts). Wash Face and genitals (private parts)  with your normal soap.  6. Wash thoroughly, paying special attention to the area where your surgery will be performed.  7. Thoroughly rinse your body with warm water from the neck down.  8. DO NOT shower/wash with your normal soap after using and rinsing off the CHG Soap.  9. Pat yourself dry with a CLEAN TOWEL.  10. Wear CLEAN PAJAMAS to bed the night before surgery, wear comfortable clothes the morning of surgery  11. Place CLEAN SHEETS on your bed the night of your first shower and DO NOT SLEEP WITH PETS.  Day of Surgery: Shower as above Do not apply any  deodorants/lotions.  Please wear clean clothes to the hospital/surgery center.   Remember to brush your teeth WITH YOUR REGULAR TOOTHPASTE.  Please read over the following fact sheets that you were given.

## 2019-01-03 ENCOUNTER — Other Ambulatory Visit (HOSPITAL_COMMUNITY)
Admission: RE | Admit: 2019-01-03 | Discharge: 2019-01-03 | Disposition: A | Discharge status: Home / Self Care | Payer: PPO | Source: Ambulatory Visit | Attending: Neurological Surgery | Admitting: Neurological Surgery

## 2019-01-03 ENCOUNTER — Encounter (HOSPITAL_COMMUNITY)
Admission: RE | Admit: 2019-01-03 | Discharge: 2019-01-03 | Disposition: A | Discharge status: Home / Self Care | Payer: PPO | Source: Ambulatory Visit | Attending: Neurological Surgery | Admitting: Neurological Surgery

## 2019-01-03 ENCOUNTER — Encounter (HOSPITAL_COMMUNITY): Payer: Self-pay

## 2019-01-03 ENCOUNTER — Other Ambulatory Visit: Payer: Self-pay

## 2019-01-03 DIAGNOSIS — I491 Atrial premature depolarization: Secondary | ICD-10-CM | POA: Diagnosis not present

## 2019-01-03 DIAGNOSIS — R9431 Abnormal electrocardiogram [ECG] [EKG]: Secondary | ICD-10-CM | POA: Insufficient documentation

## 2019-01-03 DIAGNOSIS — Z20828 Contact with and (suspected) exposure to other viral communicable diseases: Secondary | ICD-10-CM | POA: Diagnosis not present

## 2019-01-03 DIAGNOSIS — Z01818 Encounter for other preprocedural examination: Secondary | ICD-10-CM | POA: Diagnosis not present

## 2019-01-03 DIAGNOSIS — M48061 Spinal stenosis, lumbar region without neurogenic claudication: Secondary | ICD-10-CM | POA: Diagnosis not present

## 2019-01-03 LAB — SURGICAL PCR SCREEN
MRSA, PCR: NEGATIVE
Staphylococcus aureus: POSITIVE — AB

## 2019-01-03 LAB — TYPE AND SCREEN
ABO/RH(D): A POS
Antibody Screen: NEGATIVE

## 2019-01-03 LAB — BASIC METABOLIC PANEL
Anion gap: 13 (ref 5–15)
BUN: 8 mg/dL (ref 8–23)
CO2: 23 mmol/L (ref 22–32)
Calcium: 9.4 mg/dL (ref 8.9–10.3)
Chloride: 103 mmol/L (ref 98–111)
Creatinine, Ser: 0.67 mg/dL (ref 0.44–1.00)
GFR calc Af Amer: >60 mL/min (ref >60)
GFR calc non Af Amer: >60 mL/min (ref >60)
Glucose, Bld: 92 mg/dL (ref 70–99)
Potassium: 3.6 mmol/L (ref 3.5–5.1)
Sodium: 139 mmol/L (ref 135–145)

## 2019-01-03 LAB — CBC
HCT: 46.1 % — ABNORMAL HIGH (ref 36.0–46.0)
Hemoglobin: 15.3 g/dL — ABNORMAL HIGH (ref 12.0–15.0)
MCH: 34.2 pg — ABNORMAL HIGH (ref 26.0–34.0)
MCHC: 33.2 g/dL (ref 30.0–36.0)
MCV: 102.9 fL — ABNORMAL HIGH (ref 80.0–100.0)
Platelets: 246 10*3/uL (ref 150–400)
RBC: 4.48 MIL/uL (ref 3.87–5.11)
RDW: 13 % (ref 11.5–15.5)
WBC: 7.1 10*3/uL (ref 4.0–10.5)
nRBC: 0 % (ref 0.0–0.2)

## 2019-01-03 NOTE — Progress Notes (Addendum)
PCP - Sallee Lange, MD Cardiologist - pt denies  Chest x-ray - pt denies EKG - 01/03/2019 in [facility]  Stress Test - pt denies ECHO - pt denies  Cardiac Cath - pt denies  Sleep Study - pt denies CPAP - n/a  Fasting Blood Sugar -  n/a Checks Blood Sugar _____ times a day-n/a  Blood Thinner Instructions: n/a Aspirin Instructions: n/a  Anesthesia review: pending labs  Patient denies shortness of breath, fever, cough and chest pain at PAT appointment  Patient verbalized understanding of instructions that were given to them at the PAT appointment. Patient was also instructed that they will need to review over the PAT instructions again at home before surgery.   Coronavirus Screening  Have you experienced the following symptoms:  Cough yes/no: No Fever (>100.28F)  yes/no: No Runny nose yes/no: No Sore throat yes/no: No Difficulty breathing/shortness of breath  yes/no: No  Have you or a family member traveled in the last 14 days and where? yes/no: No   If the patient indicates "YES" to the above questions, their PAT will be rescheduled to limit the exposure to others and, the surgeon will be notified. THE PATIENT WILL NEED TO BE ASYMPTOMATIC FOR 14 DAYS.   If the patient is not experiencing any of these symptoms, the PAT nurse will instruct them to NOT bring anyone with them to their appointment since they may have these symptoms or traveled as well.   Please remind your patients and families that hospital visitation restrictions are in effect and the importance of the restrictions.

## 2019-01-04 LAB — SARS CORONAVIRUS 2 (TAT 6-24 HRS): SARS Coronavirus 2: NEGATIVE

## 2019-01-07 ENCOUNTER — Inpatient Hospital Stay (HOSPITAL_COMMUNITY)
Admission: RE | Admit: 2019-01-07 | Discharge: 2019-01-08 | DRG: 458 | Disposition: A | Discharge status: Home / Self Care | Payer: PPO | Attending: Neurological Surgery | Admitting: Neurological Surgery

## 2019-01-07 ENCOUNTER — Inpatient Hospital Stay (HOSPITAL_COMMUNITY): Payer: PPO | Admitting: Anesthesiology

## 2019-01-07 ENCOUNTER — Other Ambulatory Visit: Payer: Self-pay

## 2019-01-07 ENCOUNTER — Inpatient Hospital Stay (HOSPITAL_COMMUNITY): Payer: PPO | Admitting: Physician Assistant

## 2019-01-07 ENCOUNTER — Encounter (HOSPITAL_COMMUNITY)
Admission: RE | Disposition: A | Discharge status: Home / Self Care | Payer: Self-pay | Source: Home / Self Care | Attending: Neurological Surgery

## 2019-01-07 ENCOUNTER — Encounter (HOSPITAL_COMMUNITY): Payer: Self-pay | Admitting: Anesthesiology

## 2019-01-07 ENCOUNTER — Inpatient Hospital Stay (HOSPITAL_COMMUNITY): Payer: PPO

## 2019-01-07 DIAGNOSIS — M48062 Spinal stenosis, lumbar region with neurogenic claudication: Secondary | ICD-10-CM | POA: Diagnosis not present

## 2019-01-07 DIAGNOSIS — J984 Other disorders of lung: Secondary | ICD-10-CM | POA: Diagnosis not present

## 2019-01-07 DIAGNOSIS — M4316 Spondylolisthesis, lumbar region: Secondary | ICD-10-CM | POA: Diagnosis present

## 2019-01-07 DIAGNOSIS — M4156 Other secondary scoliosis, lumbar region: Secondary | ICD-10-CM | POA: Diagnosis present

## 2019-01-07 DIAGNOSIS — Z8701 Personal history of pneumonia (recurrent): Secondary | ICD-10-CM | POA: Diagnosis not present

## 2019-01-07 DIAGNOSIS — M4326 Fusion of spine, lumbar region: Secondary | ICD-10-CM | POA: Diagnosis not present

## 2019-01-07 DIAGNOSIS — M419 Scoliosis, unspecified: Secondary | ICD-10-CM | POA: Diagnosis not present

## 2019-01-07 DIAGNOSIS — Z9049 Acquired absence of other specified parts of digestive tract: Secondary | ICD-10-CM | POA: Diagnosis not present

## 2019-01-07 DIAGNOSIS — Z8249 Family history of ischemic heart disease and other diseases of the circulatory system: Secondary | ICD-10-CM | POA: Diagnosis not present

## 2019-01-07 DIAGNOSIS — Z87891 Personal history of nicotine dependence: Secondary | ICD-10-CM | POA: Diagnosis not present

## 2019-01-07 DIAGNOSIS — Z888 Allergy status to other drugs, medicaments and biological substances status: Secondary | ICD-10-CM | POA: Diagnosis not present

## 2019-01-07 DIAGNOSIS — Z885 Allergy status to narcotic agent status: Secondary | ICD-10-CM | POA: Diagnosis not present

## 2019-01-07 DIAGNOSIS — Z981 Arthrodesis status: Secondary | ICD-10-CM

## 2019-01-07 DIAGNOSIS — M4726 Other spondylosis with radiculopathy, lumbar region: Secondary | ICD-10-CM | POA: Diagnosis present

## 2019-01-07 DIAGNOSIS — Z90711 Acquired absence of uterus with remaining cervical stump: Secondary | ICD-10-CM

## 2019-01-07 DIAGNOSIS — J939 Pneumothorax, unspecified: Secondary | ICD-10-CM | POA: Diagnosis not present

## 2019-01-07 DIAGNOSIS — Z09 Encounter for follow-up examination after completed treatment for conditions other than malignant neoplasm: Secondary | ICD-10-CM

## 2019-01-07 DIAGNOSIS — K219 Gastro-esophageal reflux disease without esophagitis: Secondary | ICD-10-CM | POA: Diagnosis present

## 2019-01-07 DIAGNOSIS — Z419 Encounter for procedure for purposes other than remedying health state, unspecified: Secondary | ICD-10-CM

## 2019-01-07 DIAGNOSIS — M810 Age-related osteoporosis without current pathological fracture: Secondary | ICD-10-CM | POA: Diagnosis not present

## 2019-01-07 DIAGNOSIS — E7800 Pure hypercholesterolemia, unspecified: Secondary | ICD-10-CM | POA: Diagnosis not present

## 2019-01-07 DIAGNOSIS — I10 Essential (primary) hypertension: Secondary | ICD-10-CM | POA: Diagnosis present

## 2019-01-07 DIAGNOSIS — Z8542 Personal history of malignant neoplasm of other parts of uterus: Secondary | ICD-10-CM | POA: Diagnosis not present

## 2019-01-07 DIAGNOSIS — M2578 Osteophyte, vertebrae: Secondary | ICD-10-CM | POA: Diagnosis present

## 2019-01-07 DIAGNOSIS — M48061 Spinal stenosis, lumbar region without neurogenic claudication: Secondary | ICD-10-CM | POA: Diagnosis present

## 2019-01-07 DIAGNOSIS — J449 Chronic obstructive pulmonary disease, unspecified: Secondary | ICD-10-CM | POA: Diagnosis not present

## 2019-01-07 DIAGNOSIS — M1990 Unspecified osteoarthritis, unspecified site: Secondary | ICD-10-CM | POA: Diagnosis present

## 2019-01-07 HISTORY — PX: ANTERIOR LAT LUMBAR FUSION: SHX1168

## 2019-01-07 SURGERY — ANTERIOR LATERAL LUMBAR FUSION 1 LEVEL
Anesthesia: General | Site: Flank | Laterality: Left

## 2019-01-07 MED ORDER — LACTATED RINGERS IV SOLN
INTRAVENOUS | Status: DC | PRN
  Administered 2019-01-07 (×2): via INTRAVENOUS

## 2019-01-07 MED ORDER — LIDOCAINE 2% (20 MG/ML) 5 ML SYRINGE
INTRAMUSCULAR | Status: AC
  Filled 2019-01-07: qty 5

## 2019-01-07 MED ORDER — PROMETHAZINE HCL 25 MG/ML IJ SOLN
6.2500 mg | INTRAMUSCULAR | Status: DC | PRN
  Administered 2019-01-07: 6.25 mg via INTRAVENOUS

## 2019-01-07 MED ORDER — FENTANYL CITRATE (PF) 100 MCG/2ML IJ SOLN
25.0000 ug | INTRAMUSCULAR | Status: DC | PRN
  Administered 2019-01-07 (×4): 25 ug via INTRAVENOUS

## 2019-01-07 MED ORDER — PANTOPRAZOLE SODIUM 40 MG PO TBEC: 40.0000 mg | DELAYED_RELEASE_TABLET | Freq: Every day | ORAL | Status: DC

## 2019-01-07 MED ORDER — BUPIVACAINE HCL (PF) 0.5 % IJ SOLN
INTRAMUSCULAR | Status: AC
  Filled 2019-01-07: qty 30

## 2019-01-07 MED ORDER — DEXAMETHASONE SODIUM PHOSPHATE 10 MG/ML IJ SOLN
INTRAMUSCULAR | Status: DC | PRN
  Administered 2019-01-07: 10 mg via INTRAVENOUS

## 2019-01-07 MED ORDER — CHLORHEXIDINE GLUCONATE CLOTH 2 % EX PADS: 6.0000 | MEDICATED_PAD | Freq: Once | CUTANEOUS | Status: DC

## 2019-01-07 MED ORDER — CELECOXIB 200 MG PO CAPS
ORAL_CAPSULE | ORAL | Status: AC
  Administered 2019-01-07: 400 mg via ORAL
  Filled 2019-01-07: qty 2

## 2019-01-07 MED ORDER — 0.9 % SODIUM CHLORIDE (POUR BTL) OPTIME
TOPICAL | Status: DC | PRN
  Administered 2019-01-07 (×2): 1000 mL

## 2019-01-07 MED ORDER — MORPHINE SULFATE (PF) 2 MG/ML IV SOLN
2.0000 mg | INTRAVENOUS | Status: DC | PRN
  Administered 2019-01-07: 2 mg via INTRAVENOUS
  Filled 2019-01-07: qty 1

## 2019-01-07 MED ORDER — GLYCOPYRROLATE PF 0.2 MG/ML IJ SOSY
PREFILLED_SYRINGE | INTRAMUSCULAR | Status: DC | PRN
  Administered 2019-01-07: .2 mg via INTRAVENOUS

## 2019-01-07 MED ORDER — CELECOXIB 200 MG PO CAPS
400.0000 mg | ORAL_CAPSULE | Freq: Once | ORAL | Status: AC
  Administered 2019-01-07: 400 mg via ORAL

## 2019-01-07 MED ORDER — THROMBIN 5000 UNITS EX SOLR
OROMUCOSAL | Status: DC | PRN
  Administered 2019-01-07: 5 mL via TOPICAL

## 2019-01-07 MED ORDER — FENTANYL CITRATE (PF) 250 MCG/5ML IJ SOLN
INTRAMUSCULAR | Status: AC
  Filled 2019-01-07: qty 5

## 2019-01-07 MED ORDER — CEFAZOLIN SODIUM-DEXTROSE 2-4 GM/100ML-% IV SOLN
2.0000 g | Freq: Three times a day (TID) | INTRAVENOUS | Status: AC
  Administered 2019-01-07 – 2019-01-08 (×2): 2 g via INTRAVENOUS
  Filled 2019-01-07 (×2): qty 100

## 2019-01-07 MED ORDER — LIDOCAINE 2% (20 MG/ML) 5 ML SYRINGE
INTRAMUSCULAR | Status: DC | PRN
  Administered 2019-01-07: 80 mg via INTRAVENOUS

## 2019-01-07 MED ORDER — DOCUSATE SODIUM 100 MG PO CAPS
100.0000 mg | ORAL_CAPSULE | Freq: Two times a day (BID) | ORAL | Status: DC
  Administered 2019-01-07: 100 mg via ORAL
  Filled 2019-01-07: qty 1

## 2019-01-07 MED ORDER — OXYCODONE-ACETAMINOPHEN 5-325 MG PO TABS
1.0000 | ORAL_TABLET | ORAL | Status: DC | PRN
  Administered 2019-01-07 – 2019-01-08 (×5): 2 via ORAL
  Filled 2019-01-07 (×5): qty 2

## 2019-01-07 MED ORDER — CEFAZOLIN SODIUM-DEXTROSE 2-4 GM/100ML-% IV SOLN
2.0000 g | INTRAVENOUS | Status: AC
  Administered 2019-01-07: 2 g via INTRAVENOUS
  Filled 2019-01-07: qty 100

## 2019-01-07 MED ORDER — FENTANYL CITRATE (PF) 250 MCG/5ML IJ SOLN
INTRAMUSCULAR | Status: DC | PRN
  Administered 2019-01-07 (×2): 50 ug via INTRAVENOUS
  Administered 2019-01-07: 100 ug via INTRAVENOUS
  Administered 2019-01-07: 25 ug via INTRAVENOUS
  Administered 2019-01-07 (×2): 50 ug via INTRAVENOUS

## 2019-01-07 MED ORDER — LIDOCAINE-EPINEPHRINE 1 %-1:100000 IJ SOLN
INTRAMUSCULAR | Status: AC
  Filled 2019-01-07: qty 1

## 2019-01-07 MED ORDER — MENTHOL 3 MG MT LOZG: 1.0000 | LOZENGE | OROMUCOSAL | Status: DC | PRN

## 2019-01-07 MED ORDER — PROPOFOL 10 MG/ML IV BOLUS
INTRAVENOUS | Status: AC
  Filled 2019-01-07: qty 20

## 2019-01-07 MED ORDER — METHOCARBAMOL 1000 MG/10ML IJ SOLN
500.0000 mg | Freq: Four times a day (QID) | INTRAVENOUS | Status: DC | PRN
  Filled 2019-01-07: qty 5

## 2019-01-07 MED ORDER — BISACODYL 10 MG RE SUPP: 10.0000 mg | Freq: Every day | RECTAL | Status: DC | PRN

## 2019-01-07 MED ORDER — CALCIUM CARBONATE-VITAMIN D 500-200 MG-UNIT PO TABS: 2.0000 | ORAL_TABLET | Freq: Every day | ORAL | Status: DC

## 2019-01-07 MED ORDER — SENNA 8.6 MG PO TABS
1.0000 | ORAL_TABLET | Freq: Two times a day (BID) | ORAL | Status: DC
  Administered 2019-01-07 (×2): 8.6 mg via ORAL
  Filled 2019-01-07: qty 1

## 2019-01-07 MED ORDER — ACETAMINOPHEN 500 MG PO TABS
ORAL_TABLET | ORAL | Status: AC
  Administered 2019-01-07: 1000 mg via ORAL
  Filled 2019-01-07: qty 2

## 2019-01-07 MED ORDER — KETOROLAC TROMETHAMINE 15 MG/ML IJ SOLN
INTRAMUSCULAR | Status: AC
  Filled 2019-01-07: qty 1

## 2019-01-07 MED ORDER — ACETAMINOPHEN 500 MG PO TABS
1000.0000 mg | ORAL_TABLET | Freq: Once | ORAL | Status: AC
  Administered 2019-01-07: 1000 mg via ORAL

## 2019-01-07 MED ORDER — CALCIUM-D 600-400 MG-UNIT PO TABS: 2.0000 | ORAL_TABLET | Freq: Every day | ORAL | Status: DC

## 2019-01-07 MED ORDER — SODIUM CHLORIDE 0.9% FLUSH: 3.0000 mL | INTRAVENOUS | Status: DC | PRN

## 2019-01-07 MED ORDER — DIPHENHYDRAMINE HCL 50 MG/ML IJ SOLN
INTRAMUSCULAR | Status: DC | PRN
  Administered 2019-01-07: 12.5 mg via INTRAVENOUS

## 2019-01-07 MED ORDER — METHOCARBAMOL 500 MG PO TABS
ORAL_TABLET | ORAL | Status: AC
  Filled 2019-01-07: qty 1

## 2019-01-07 MED ORDER — FENTANYL CITRATE (PF) 100 MCG/2ML IJ SOLN
INTRAMUSCULAR | Status: AC
  Filled 2019-01-07: qty 2

## 2019-01-07 MED ORDER — LIDOCAINE-EPINEPHRINE 1 %-1:100000 IJ SOLN
INTRAMUSCULAR | Status: DC | PRN
  Administered 2019-01-07: 10 mL

## 2019-01-07 MED ORDER — POLYETHYLENE GLYCOL 3350 17 G PO PACK: 17.0000 g | PACK | Freq: Every day | ORAL | Status: DC | PRN

## 2019-01-07 MED ORDER — BUPIVACAINE HCL (PF) 0.5 % IJ SOLN
INTRAMUSCULAR | Status: DC | PRN
  Administered 2019-01-07: 20 mL
  Administered 2019-01-07: 10 mL

## 2019-01-07 MED ORDER — HYDRALAZINE HCL 20 MG/ML IJ SOLN
INTRAMUSCULAR | Status: AC
  Filled 2019-01-07: qty 1

## 2019-01-07 MED ORDER — HYDRALAZINE HCL 20 MG/ML IJ SOLN
5.0000 mg | Freq: Once | INTRAMUSCULAR | Status: AC
  Administered 2019-01-07: 5 mg via INTRAVENOUS

## 2019-01-07 MED ORDER — KETOROLAC TROMETHAMINE 15 MG/ML IJ SOLN
7.5000 mg | Freq: Four times a day (QID) | INTRAMUSCULAR | Status: AC
  Administered 2019-01-07 – 2019-01-08 (×4): 7.5 mg via INTRAVENOUS
  Filled 2019-01-07 (×3): qty 1

## 2019-01-07 MED ORDER — AMLODIPINE BESYLATE 5 MG PO TABS: 5.0000 mg | ORAL_TABLET | Freq: Every day | ORAL | Status: DC

## 2019-01-07 MED ORDER — ONDANSETRON HCL 4 MG PO TABS: 4.0000 mg | ORAL_TABLET | Freq: Four times a day (QID) | ORAL | Status: DC | PRN

## 2019-01-07 MED ORDER — ACETAMINOPHEN 325 MG PO TABS: 650.0000 mg | ORAL_TABLET | ORAL | Status: DC | PRN

## 2019-01-07 MED ORDER — ALUM & MAG HYDROXIDE-SIMETH 200-200-20 MG/5ML PO SUSP: 30.0000 mL | Freq: Four times a day (QID) | ORAL | Status: DC | PRN

## 2019-01-07 MED ORDER — PROPOFOL 10 MG/ML IV BOLUS
INTRAVENOUS | Status: DC | PRN
  Administered 2019-01-07: 120 mg via INTRAVENOUS

## 2019-01-07 MED ORDER — PHENOL 1.4 % MT LIQD: 1.0000 | OROMUCOSAL | Status: DC | PRN

## 2019-01-07 MED ORDER — SODIUM CHLORIDE 0.9 % IV SOLN
INTRAVENOUS | Status: DC | PRN
  Administered 2019-01-07: 500 mL

## 2019-01-07 MED ORDER — THROMBIN 5000 UNITS EX SOLR
CUTANEOUS | Status: AC
  Filled 2019-01-07: qty 5000

## 2019-01-07 MED ORDER — DEXAMETHASONE SODIUM PHOSPHATE 10 MG/ML IJ SOLN
INTRAMUSCULAR | Status: AC
  Filled 2019-01-07: qty 1

## 2019-01-07 MED ORDER — METHOCARBAMOL 500 MG PO TABS
500.0000 mg | ORAL_TABLET | Freq: Four times a day (QID) | ORAL | Status: DC | PRN
  Administered 2019-01-07 – 2019-01-08 (×2): 500 mg via ORAL
  Filled 2019-01-07 (×2): qty 1

## 2019-01-07 MED ORDER — ACETAMINOPHEN 650 MG RE SUPP: 650.0000 mg | RECTAL | Status: DC | PRN

## 2019-01-07 MED ORDER — SODIUM CHLORIDE 0.9% FLUSH
3.0000 mL | Freq: Two times a day (BID) | INTRAVENOUS | Status: DC
  Administered 2019-01-07: 3 mL via INTRAVENOUS

## 2019-01-07 MED ORDER — PROMETHAZINE HCL 25 MG/ML IJ SOLN
INTRAMUSCULAR | Status: AC
  Filled 2019-01-07: qty 1

## 2019-01-07 MED ORDER — FLEET ENEMA 7-19 GM/118ML RE ENEM: 1.0000 | ENEMA | Freq: Once | RECTAL | Status: DC | PRN

## 2019-01-07 MED ORDER — ONDANSETRON HCL 4 MG/2ML IJ SOLN
INTRAMUSCULAR | Status: AC
  Filled 2019-01-07: qty 2

## 2019-01-07 MED ORDER — ALUM & MAG HYDROXIDE-SIMETH 200-200-20 MG/5ML PO SUSP: 15.0000 mL | Freq: Four times a day (QID) | ORAL | Status: DC | PRN

## 2019-01-07 MED ORDER — ONDANSETRON HCL 4 MG/2ML IJ SOLN
INTRAMUSCULAR | Status: DC | PRN
  Administered 2019-01-07: 4 mg via INTRAVENOUS

## 2019-01-07 MED ORDER — SUCCINYLCHOLINE CHLORIDE 200 MG/10ML IV SOSY
PREFILLED_SYRINGE | INTRAVENOUS | Status: DC | PRN
  Administered 2019-01-07: 100 mg via INTRAVENOUS

## 2019-01-07 MED ORDER — ONDANSETRON HCL 4 MG/2ML IJ SOLN: 4.0000 mg | Freq: Four times a day (QID) | INTRAMUSCULAR | Status: DC | PRN

## 2019-01-07 MED ORDER — SUCCINYLCHOLINE CHLORIDE 200 MG/10ML IV SOSY
PREFILLED_SYRINGE | INTRAVENOUS | Status: AC
  Filled 2019-01-07: qty 10

## 2019-01-07 MED ORDER — CELECOXIB 200 MG PO CAPS
ORAL_CAPSULE | ORAL | Status: AC
  Filled 2019-01-07: qty 1

## 2019-01-07 SURGICAL SUPPLY — 55 items
ADH SKN CLS APL DERMABOND .7 (GAUZE/BANDAGES/DRESSINGS) ×1
BAG DECANTER FOR FLEXI CONT (MISCELLANEOUS) ×2 IMPLANT
BLADE CLIPPER SURG (BLADE) IMPLANT
BOLT PLATE XLIF 5.5X55 LRG (Bolt) ×1 IMPLANT
BOLT PLATE XLIF 5.5X55MM LRG (Bolt) ×1 IMPLANT
BONE MATRIX OSTEOCEL PRO MED (Bone Implant) ×2 IMPLANT
CATH ROBINSON RED A/P 14FR (CATHETERS) ×2 IMPLANT
COVER WAND RF STERILE (DRAPES) ×1 IMPLANT
DERMABOND ADVANCED (GAUZE/BANDAGES/DRESSINGS) ×2
DERMABOND ADVANCED .7 DNX12 (GAUZE/BANDAGES/DRESSINGS) ×1 IMPLANT
DRAPE C-ARM 42X72 X-RAY (DRAPES) ×3 IMPLANT
DRAPE C-ARMOR (DRAPES) ×3 IMPLANT
DRAPE LAPAROTOMY 100X72X124 (DRAPES) ×3 IMPLANT
DRAPE POUCH INSTRU U-SHP 10X18 (DRAPES) ×1 IMPLANT
DURAPREP 26ML APPLICATOR (WOUND CARE) ×3 IMPLANT
ELECT REM PT RETURN 9FT ADLT (ELECTROSURGICAL) ×3
ELECTRODE REM PT RTRN 9FT ADLT (ELECTROSURGICAL) ×1 IMPLANT
GAUZE 4X4 16PLY RFD (DISPOSABLE) IMPLANT
GLOVE BIOGEL PI IND STRL 7.0 (GLOVE) IMPLANT
GLOVE BIOGEL PI IND STRL 7.5 (GLOVE) IMPLANT
GLOVE BIOGEL PI IND STRL 8 (GLOVE) IMPLANT
GLOVE BIOGEL PI IND STRL 8.5 (GLOVE) ×1 IMPLANT
GLOVE BIOGEL PI INDICATOR 7.0 (GLOVE) ×2
GLOVE BIOGEL PI INDICATOR 7.5 (GLOVE) ×6
GLOVE BIOGEL PI INDICATOR 8 (GLOVE) ×4
GLOVE BIOGEL PI INDICATOR 8.5 (GLOVE) ×2
GLOVE ECLIPSE 7.5 STRL STRAW (GLOVE) ×6 IMPLANT
GLOVE ECLIPSE 8.5 STRL (GLOVE) ×3 IMPLANT
GOWN STRL REUS W/ TWL LRG LVL3 (GOWN DISPOSABLE) IMPLANT
GOWN STRL REUS W/ TWL XL LVL3 (GOWN DISPOSABLE) ×1 IMPLANT
GOWN STRL REUS W/TWL 2XL LVL3 (GOWN DISPOSABLE) ×3 IMPLANT
GOWN STRL REUS W/TWL LRG LVL3 (GOWN DISPOSABLE) ×3
GOWN STRL REUS W/TWL XL LVL3 (GOWN DISPOSABLE) ×3
HEMOSTAT POWDER KIT SURGIFOAM (HEMOSTASIS) ×2 IMPLANT
KIT BASIN OR (CUSTOM PROCEDURE TRAY) ×3 IMPLANT
KIT DILATOR XLIF 5 (KITS) ×1 IMPLANT
KIT SURGICAL ACCESS MAXCESS 4 (KITS) ×2 IMPLANT
KIT TURNOVER KIT B (KITS) ×3 IMPLANT
KIT XLIF (KITS) ×1
MODULE NVM5 NEXT GEN EMG (NEEDLE) ×2 IMPLANT
MODULUS XLW 10X22X50MM 10DEG (Spine Construct) ×2 IMPLANT
NDL HYPO 25X1 1.5 SAFETY (NEEDLE) ×1 IMPLANT
NEEDLE HYPO 25X1 1.5 SAFETY (NEEDLE) ×3 IMPLANT
NS IRRIG 1000ML POUR BTL (IV SOLUTION) ×5 IMPLANT
PACK LAMINECTOMY NEURO (CUSTOM PROCEDURE TRAY) ×3 IMPLANT
PLATE 2H 10MM (Plate) ×2 IMPLANT
PLATE DECADE XLIP 2H SZ12 (Plate) ×2 IMPLANT
SCREW DECADE 5.5X50 (Screw) ×2 IMPLANT
SPONGE LAP 4X18 RFD (DISPOSABLE) IMPLANT
SUT VIC AB 2-0 CP2 18 (SUTURE) ×3 IMPLANT
SUT VIC AB 3-0 SH 8-18 (SUTURE) ×3 IMPLANT
TOWEL GREEN STERILE (TOWEL DISPOSABLE) ×3 IMPLANT
TOWEL GREEN STERILE FF (TOWEL DISPOSABLE) ×3 IMPLANT
TRAY FOLEY MTR SLVR 16FR STAT (SET/KITS/TRAYS/PACK) ×3 IMPLANT
WATER STERILE IRR 1000ML POUR (IV SOLUTION) ×3 IMPLANT

## 2019-01-07 NOTE — Anesthesia Procedure Notes (Signed)
Procedure Name: Intubation Date/Time: 01/07/2019 12:22 PM Performed by: Teressa Lower., CRNA Pre-anesthesia Checklist: Patient identified, Emergency Drugs available, Suction available and Patient being monitored Patient Re-evaluated:Patient Re-evaluated prior to induction Oxygen Delivery Method: Circle system utilized Preoxygenation: Pre-oxygenation with 100% oxygen Induction Type: IV induction Ventilation: Mask ventilation without difficulty Laryngoscope Size: Miller and 2 Grade View: Grade I Tube type: Oral Tube size: 7.0 mm Number of attempts: 1 Airway Equipment and Method: Stylet Placement Confirmation: ETT inserted through vocal cords under direct vision,  positive ETCO2 and breath sounds checked- equal and bilateral Secured at: 22 cm Tube secured with: Tape Dental Injury: Teeth and Oropharynx as per pre-operative assessment

## 2019-01-07 NOTE — Anesthesia Postprocedure Evaluation (Signed)
Anesthesia Post Note  Patient: Terri Harris  Procedure(s) Performed: Lumbar one-two Anterolateral lumbar interbody fusion with lateral plate fixation (Left Flank)     Patient location during evaluation: PACU Anesthesia Type: General Level of consciousness: sedated and patient cooperative Pain management: pain level controlled Vital Signs Assessment: post-procedure vital signs reviewed and stable Respiratory status: spontaneous breathing Cardiovascular status: stable Anesthetic complications: no    Last Vitals:  Vitals:   01/07/19 1500 01/07/19 1525  BP: (!) 162/74 (!) 162/67  Pulse: 66 73  Resp: 19 18  Temp: (!) 36.3 C   SpO2: 96% 100%    Last Pain:  Vitals:   01/07/19 1500  PainSc: [name]

## 2019-01-07 NOTE — Transfer of Care (Signed)
Immediate Anesthesia Transfer of Care Note  Patient: Terri Harris  Procedure(s) Performed: Lumbar one-two Anterolateral lumbar interbody fusion with lateral plate fixation (Left Flank)  Patient Location: PACU  Anesthesia Type:General  Level of Consciousness: awake, alert  and oriented  Airway & Oxygen Therapy: Patient Spontanous Breathing  Post-op Assessment: Report given to RN and Post -op Vital signs reviewed and stable  Post vital signs: Reviewed and stable  Last Vitals:  Vitals Value Taken Time  BP 172/91 01/07/19 1401  Temp    Pulse 72 01/07/19 1403  Resp 15 01/07/19 1403  SpO2 100 % 01/07/19 1403  Vitals shown include unvalidated device data.  Last Pain:  Vitals:   01/07/19 0957  PainSc: 5       Patients Stated Pain Goal: 5 (58/83/25 4982)  Complications: No apparent anesthesia complications

## 2019-01-07 NOTE — Progress Notes (Signed)
Patient ID: Terri Harris, female   DOB: 04-23-1941, 78 y.o.   MRN: [id number] Vital signs are stable Patient feels chilly Neurologically she is doing well Motor function is good in the lower extremities We will chest chest x-ray in the morning

## 2019-01-07 NOTE — Progress Notes (Signed)
Orthopedic Tech Progress Note Patient Details:  Terri Harris 01-15-1941 [id number] RN said patient has brace Patient ID: [name], female   DOB: Mar 10, 1941, 78 y.o.   MRN: [id number]   [name] 01/07/2019, 3:43 PM

## 2019-01-07 NOTE — Op Note (Signed)
Date of surgery: 01/07/2019 Preoperative diagnosis: Lumbar spondylosis and stenosis L1-L2 with lumbar radiculopathy and back pain. Postoperative diagnosis: Same Procedure: Anterolateral decompression L1-L2 and stabilization using ex lift spacer allograft and lateral plate fixation with EMG monitoring and fluoroscopic imaging. Surgeon: Kristeen Miss Anesthesia: General endotracheal Indications: Terri Harris is a 78 year old individual who has had significant issues of back pain and spondylosis.  She has had a previous anterolateral decompression stabilization from L2-L5 for degenerative scoliosis.  She is now developed a substantial retrolisthesis and spondylitic change at L1-L2.  She is been advised regarding surgical decompression and stabilization using an anterolateral approach with a lateral plate fixation.  Procedure: The patient was brought to the operating room supine on the stretcher.  After the smooth induction of general endotracheal anesthesia, she was turned to the right lateral decubitus position.  Left lateral aspect of L1-L2 was localized and orthogonal radiographs were performed.  The patient was then secured to the operating table with tape.  A break was placed in the table so as to open the left-sided interspace at L1-L2.  Then by marking the skin overlying the perpendicular approach to the L1-L2 interspace the area was prepped with alcohol DuraPrep and draped in a sterile fashion.  Skin was infiltrated with 10 cc of lidocaine mixed half-and-half with half percent Marcaine.  The dissection was carried down through the skin into the subcutaneous tissues a second incision was created posteriorly and inferiorly to access the flank.  Once the retroperitoneal space was accessed the index finger on the left side was placed into that opening and a blunt probe was placed under the 11th rib and the retroperitoneal space was accessed with a blunt probe.  The probe was then placed into the disc space at  the L1-L2 level checking radiographically stimulation was performed make sure no elements of the lumbar plexus were involved.  When this was verified a series of dilators were then placed after a K wire was used to cure the blood flow up to the disc space ultimately 110 mm deep retractor was placed all the time checking for any electrical activity from the lumbar plexus.  When none was noted the care was taken to check the posterior aspect of the retractor to make sure that no nerve roots were identified in this region and then a shim dissector was placed into the disc space under fluoroscopic visualization at L1-L2.  The retractor was secured to the operating table with a clamp and the retractor was then dilated to allow better visualization of the lateral aspect of the vertebral bodies at L1-L2.  The disc space was cleared of soft tissues and a 15 blade was used to open the disc space.  A Cobb elevator was placed across the disc space and the contralateral ligament was opened.  Then a series of dilators was placed into the disc space to dilate to the 18 mm x 8 mm size.  A 10 mm dissector was then placed and a significant pop was felt as the contralateral ligament opened.  Then the disc space was cleared of a small amount of markedly degenerated disc material and the endplates were curettaged using a triangle curettage.  In the end all the disc was removed from within the disc space and this was a poultry some lateral osteophytes were removed but ultimately it was felt that a 10 mm tall spacer measuring 22 x 50 mm in dimension with 10 degrees lordosis would fit best into this interval this was  a titanium spacer that was filled with ostia cell allograft.  The graft was set into position under fluoroscopic visualization well felt to be adequate a 12 mm tall lateral plate was fixed to the lateral aspect of the vertebrae and secured with 250 mm screws in L1 a 55 mm screw was used.  Final radiographs were obtained after  removal of the retractor.  These identified good position of the spacer and the grafts.  With this the retractors were carefully removed the area was inspected and it was noted that the pleura had been entered and the anterior superior aspect the pleura was closed with several 2-0 Vicryl sutures after irrigating the intrapleural space with a red Robinson catheter in place the red [place] catheter was removed.  Then the fascia over this was closed with 2-0 Vicryl and the subcutaneous tissue was closed with 2-0 Vicryl also 3-0 Vicryl was used in subcuticular skin.  Blood loss for the procedure was estimated at 50 cc.

## 2019-01-07 NOTE — H&P (Signed)
Terri Harris is an 78 y.o. female.   Chief Complaint: Back and bilateral leg pain HPI: Terri Harris is a 78 year old individual whose had a previous decompression and fusion from L2-L5 for degenerative spondylosis with stenosis and scoliosis she had recovered significant functional status but has in the last year developed increasing back pain she notes that there is some radiation into the proximal lower extremities pain is become increasingly worse and a recent scan demonstrates that she has now developed advanced degenerative changes with retrolisthesis and stenosis at L1-L2.  L5-S1 appears to be fairly stable with modest spondylitic changes.  After failing efforts at conservative treatment including a number of injections physical therapy I have advised surgical decompression and stabilization via an anterolateral approach at L1-L2.  She is now admitted for this procedure.  Past Medical History:  Diagnosis Date  . ANA positive 01/2001  . Arthritis   . Arthritis 07/02/2013   ANA positive  . Cancer [facility])    partial hysterectomy age 76-uterine cancer  . GERD (gastroesophageal reflux disease)   . Hx of tobacco use, presenting hazards to health 07/02/2013  . Hypercholesteremia   . Hypertension   . Ileus, postoperative ([place]) 07/02/2013  . Osteoporosis   . Pneumonia    hx of  . SBO (small bowel obstruction) ([place]) 06/20/2103    Past Surgical History:  Procedure Laterality Date  . ABDOMINAL HYSTERECTOMY    . ANTERIOR LAT LUMBAR FUSION N/A 06/08/2014   Procedure: LUMBAR 2-3 LUMBAR 3-4 LUMBAR 4-5 ANTEROLATERAL DECOMPRESSION/FUSION WITH PERCUTANEOUS PEDICLE SCREWS;  Surgeon: Kristeen Miss, MD;  Location: [place] [facility];  Service: Neurosurgery;  Laterality: N/A;  L2-3 L3-4 L4-5 ANTEROLATERAL DECOMPRESSION/FUSION WITH PERCUTANEOUS PEDICLE SCREWS  . BACK SURGERY    . BOWEL RESECTION N/A 06/20/2013   Procedure: SMALL BOWEL RESECTION;  Surgeon: Harl Bowie, MD;  Location: [place];  Service: General;   Laterality: N/A;  . COLON SURGERY    . COLONOSCOPY  10/25/2011   Procedure: COLONOSCOPY;  Surgeon: Rogene Houston, MD;  [facility];  Service: Endoscopy;  Laterality: N/A;  930  . COLONOSCOPY N/A 05/09/2017   Procedure: COLONOSCOPY;  Surgeon: Rogene Houston, MD;  [facility];  Service: Endoscopy;  Laterality: N/A;  1030  . ESOPHAGEAL DILATION N/A 05/25/2015   Procedure: ESOPHAGEAL DILATION;  Surgeon: Rogene Houston, MD;  [facility];  Service: Endoscopy;  Laterality: N/A;  . ESOPHAGOGASTRODUODENOSCOPY  02/2009  . ESOPHAGOGASTRODUODENOSCOPY N/A 05/25/2015   Procedure: ESOPHAGOGASTRODUODENOSCOPY (EGD);  Surgeon: Rogene Houston, MD;  [facility];  Service: Endoscopy;  Laterality: N/A;  . HERNIA REPAIR Left    groin  . INGUINAL HERNIA REPAIR Left 09/11/2013   Procedure: HERNIA REPAIR INGUINAL INCARCERATED;  Surgeon: Harl Bowie, MD;  Location: [place];  Service: General;  Laterality: Left;  . LAPAROTOMY N/A 06/20/2013   Procedure: EXPLORATORY LAPAROTOMY;  Surgeon: Harl Bowie, MD;  Location: [place];  Service: General;  Laterality: N/A;  . LUMBAR PERCUTANEOUS PEDICLE SCREW 3 LEVEL N/A 06/08/2014   Procedure: LUMBAR PERCUTANEOUS PEDICLE SCREW 3 LEVEL;  Surgeon: Kristeen Miss, MD;  Location: [place] [facility];  Service: Neurosurgery;  Laterality: N/A;  . POLYPECTOMY  05/09/2017   Procedure: POLYPECTOMY;  Surgeon: Rogene Houston, MD;  [facility];  Service: Endoscopy;;  sigmoid colon  . TONSILLECTOMY      Family History  Problem Relation Age of Onset  . Hypertension Mother   . Heart disease Father   .  Diabetes Sister   . Heart disease Sister   . Diabetes Brother   . Heart disease Brother   . Anesthesia problems Neg Hx   . Hypotension Neg Hx   . Malignant hyperthermia Neg Hx   . Pseudochol deficiency Neg Hx    Social History:  reports that she quit smoking about 7 years ago. Her smoking use included cigarettes. She smoked  0.25 packs per day for 0.00 years. She has never used smokeless tobacco. She reports that she does not drink alcohol or use drugs.  Allergies:  Allergies  Allergen Reactions  . Codeine Nausea And Vomiting  . Fosamax [Alendronate Sodium] Other (See Comments)    gastritis    No medications prior to admission.    No results found for this or any previous visit (from the past 48 hour(s)). No results found.  Review of Systems  Constitutional: Negative.   HENT: Negative.   Eyes: Negative.   Respiratory: Negative.   Cardiovascular: Negative.   Gastrointestinal: Negative.   Genitourinary: Negative.   Neurological: Positive for tingling.  Endo/Heme/Allergies: Negative.   Psychiatric/Behavioral: Negative.     There were no vitals taken for this visit. Physical Exam  Constitutional: She appears well-developed and well-nourished.  HENT:  Head: Normocephalic and atraumatic.  Eyes: Pupils are equal, round, and reactive to light. Conjunctivae and EOM are normal.  Neck: Neck supple.  Cardiovascular: Normal rate and regular rhythm.  Respiratory: Effort normal and breath sounds normal.  GI: Soft. Bowel sounds are normal.  Musculoskeletal:     Comments: Positive straight leg raising at 30 degrees Patrick's maneuver is negative bilaterally.  Motor strength is intact in iliopsoas and quadriceps absent reflexes in the patella and Achilles both.  Neurological: She is alert. She displays abnormal reflex. No cranial nerve deficit. She exhibits normal muscle tone. Coordination normal.  Skin: Skin is warm and dry.  Psychiatric: She has a normal mood and affect. Her behavior is normal. Judgment and thought content normal.     Assessment/Plan Spondylosis and stenosis with retrolisthesis L1-L2, neurogenic claudication.  Lumbar radiculopathy.  Plan anterolateral decompression at L1-2 with ex lift spacer and lateral plate fixation.  Earleen Newport, MD 01/07/2019, 7:57 AM

## 2019-01-07 NOTE — Anesthesia Preprocedure Evaluation (Addendum)
Anesthesia Evaluation  Patient identified by MRN, date of birth, ID band Patient awake    Reviewed: Allergy & Precautions, NPO status , Patient's Chart, lab work & pertinent test results  History of Anesthesia Complications Negative for: history of anesthetic complications  Airway Mallampati: II  TM Distance: >3 FB Neck ROM: Full    Dental  (+) Teeth Intact, Caps, Partial Lower, Dental Advisory Given   Pulmonary COPD, former smoker,    Pulmonary exam normal        Cardiovascular hypertension, Pt. on medications Normal cardiovascular exam     Neuro/Psych    GI/Hepatic GERD  ,  Endo/Other    Renal/GU      Musculoskeletal  (+) Arthritis ,   Abdominal   Peds  Hematology   Anesthesia Other Findings   Reproductive/Obstetrics                            Anesthesia Physical  Anesthesia Plan  ASA: III  Anesthesia Plan: General   Post-op Pain Management:    Induction: Intravenous  PONV Risk Score and Plan: 3 and Ondansetron, Dexamethasone and Diphenhydramine  Airway Management Planned: Oral ETT  Additional Equipment:   Intra-op Plan:   Post-operative Plan: Extubation in OR  Informed Consent: I have reviewed the patients History and Physical, chart, labs and discussed the procedure including the risks, benefits and alternatives for the proposed anesthesia with the patient or authorized representative who has indicated his/her understanding and acceptance.     Dental advisory given  Plan Discussed with: Anesthesiologist and CRNA  Anesthesia Plan Comments:        Anesthesia Quick Evaluation

## 2019-01-08 ENCOUNTER — Inpatient Hospital Stay (HOSPITAL_COMMUNITY): Payer: PPO

## 2019-01-08 ENCOUNTER — Encounter (HOSPITAL_COMMUNITY): Payer: Self-pay | Admitting: Neurological Surgery

## 2019-01-08 MED ORDER — METHOCARBAMOL 500 MG PO TABS: 500.0000 mg | ORAL_TABLET | Freq: Four times a day (QID) | ORAL | 3 refills | Status: DC | PRN

## 2019-01-08 NOTE — Evaluation (Signed)
Physical Therapy Evaluation and Discharge Patient Details Name: Terri Harris MRN: [id number] DOB: 1940-11-03 Today's Date: 01/08/2019   History of Present Illness  Pt is a 78 y/o female who presents s/p L1-L2 ALIF on 01/07/2019.   Clinical Impression  Patient evaluated by Physical Therapy with no further acute PT needs identified. All education has been completed and the patient has no further questions. At the time of PT eval pt was able to perform transfers and ambulation with gross modified independence and no AD. Pt was educated on precautions, car transfer, activity progression, and recommended brace wearing schedule. See below for any follow-up Physical Therapy or equipment needs. PT is signing off. Thank you for this referral.     Follow Up Recommendations No PT follow up;Supervision - Intermittent    Equipment Recommendations  3in1 (PT)    Recommendations for Other Services       Precautions / Restrictions Precautions Precautions: Fall;Back Precaution Booklet Issued: Yes (comment) Precaution Comments: Reviewed handout in detail. Pt was able to recall precautions from prior surgeries Required Braces or Orthoses: Spinal Brace Spinal Brace: Lumbar corset(Pt has it at home, did not bring to the hospital) Restrictions Weight Bearing Restrictions: No      Mobility  Bed Mobility               General bed mobility comments: Pt was received standing at doorway awaiting PT  Transfers Overall transfer level: Modified independent Equipment used: None             General transfer comment: Pt demonstrated proper hand placement on seated surface for safety. No assist required.   Ambulation/Gait Ambulation/Gait assistance: Modified independent (Device/Increase time) Gait Distance (Feet): 400 Feet Assistive device: None Gait Pattern/deviations: Step-through pattern;Decreased stride length;Trunk flexed Gait velocity: Decreased Gait velocity interpretation: 1.31 - 2.62  ft/sec, indicative of limited community ambulator General Gait Details: Pt generally flexed in trunk but baseline as pt very kyphotic. Generally steady. No overt LOB noted throughout gait training.   Stairs            Wheelchair Mobility    Modified Rankin (Stroke Patients Only)       Balance Overall balance assessment: Needs assistance Sitting-balance support: Feet supported Sitting balance-Leahy Scale: Fair     Standing balance support: No upper extremity supported;During functional activity Standing balance-Leahy Scale: Fair Standing balance comment: No UE support required during functional mobility                             Pertinent Vitals/Pain Pain Assessment: Faces Faces Pain Scale: Hurts little more Pain Location: incision site Pain Descriptors / Indicators: Operative site guarding Pain Intervention(s): Limited activity within patient's tolerance;Monitored during session;Repositioned    Home Living Family/patient expects to be discharged to:: Private residence Living Arrangements: Spouse/significant other Available Help at Discharge: Family;Available 24 hours/day Type of Home: House Home Access: Level entry     Home Layout: One level Home Equipment: Walker - 2 wheels      Prior Function Level of Independence: Independent               Hand Dominance   Dominant Hand: Right    Extremity/Trunk Assessment   Upper Extremity Assessment Upper Extremity Assessment: Defer to OT evaluation    Lower Extremity Assessment Lower Extremity Assessment: Generalized weakness    Cervical / Trunk Assessment Cervical / Trunk Assessment: Kyphotic  Communication   Communication: No difficulties  Cognition Arousal/Alertness:  Awake/alert Behavior During Therapy: WFL for tasks assessed/performed Overall Cognitive Status: Within Functional Limits for tasks assessed                                        General Comments       Exercises     Assessment/Plan    PT Assessment Patent does not need any further PT services  PT Problem List         PT Treatment Interventions DME instruction;Gait training;Functional mobility training;Therapeutic activities;Therapeutic exercise;Neuromuscular re-education;Patient/family education    PT Goals (Current goals can be found in the Care Plan section)  Acute Rehab PT Goals Patient Stated Goal: Home today PT Goal Formulation: With patient Time For Goal Achievement: 01/15/19 Potential to Achieve Goals: Good    Frequency Min 5X/week   Barriers to discharge        Co-evaluation               AM-PAC PT "6 Clicks" Mobility  Outcome Measure Help needed turning from your back to your side while in a flat bed without using bedrails?: None Help needed moving from lying on your back to sitting on the side of a flat bed without using bedrails?: None Help needed moving to and from a bed to a chair (including a wheelchair)?: None Help needed standing up from a chair using your arms (e.g., wheelchair or bedside chair)?: None Help needed to walk in hospital room?: None Help needed climbing 3-5 steps with a railing? : A Little 6 Click Score: 23    End of Session Equipment Utilized During Treatment: Gait belt Activity Tolerance: Patient tolerated treatment well Patient left: in chair;with call bell/phone within reach Nurse Communication: Mobility status PT Visit Diagnosis: Unsteadiness on feet (R26.81);Other symptoms and signs involving the nervous system (R29.898);Pain Pain - part of body: (back)    Time: 0752-0801 PT Time Calculation (min) (ACUTE ONLY): 9 min   Charges:   PT Evaluation $PT Eval Low Complexity: 1 Low          Terri Harris, PT, DPT Acute Rehabilitation Services Pager: [phone] Office: [phone]   Terri Harris 01/08/2019, 9:50 AM

## 2019-01-08 NOTE — Evaluation (Signed)
Occupational Therapy Evaluation and Discharge Patient Details Name: Terri Harris MRN: [id number] DOB: [birth year] Today's Date: 01/08/2019    History of Present Illness Pt is a 78 y/o female who presents s/p L1-L2 ALIF on 01/07/2019.    Clinical Impression   All education completed with pt and husband verbalizing and/or demonstrating understanding. No further OT needs.     Follow Up Recommendations  No OT follow up    Equipment Recommendations  3 in 1 bedside commode    Recommendations for Other Services       Precautions / Restrictions Precautions Precautions: Fall;Back Precaution Booklet Issued: Yes (comment) Precaution Comments: Reviewed handout in detail. Pt was able to recall precautions from prior surgeries Required Braces or Orthoses: Spinal Brace Spinal Brace: Lumbar corset;Applied in sitting position Restrictions Weight Bearing Restrictions: No      Mobility Bed Mobility Overal bed mobility: Needs Assistance Bed Mobility: Rolling;Sidelying to Sit;Sit to Sidelying Rolling: Supervision Sidelying to sit: Supervision     Sit to sidelying: Supervision General bed mobility comments: supervision for log roll technique  Transfers Overall transfer level: Modified independent Equipment used: None             General transfer comment: Pt demonstrated proper hand placement on seated surface for safety. No assist required.     Balance Overall balance assessment: Needs assistance Sitting-balance support: Feet supported Sitting balance-Leahy Scale: Good     Standing balance support: No upper extremity supported;During functional activity Standing balance-Leahy Scale: Fair Standing balance comment: No UE support required during functional mobility                           ADL either performed or assessed with clinical judgement   ADL Overall ADL's : Needs assistance/impaired Eating/Feeding: Independent;Sitting   Grooming:  Supervision/safety;Standing Grooming Details (indicate cue type and reason): educated in 2 cup method for toothbrushing and use of wash cloth for face Upper Body Bathing: Set up;Sitting Upper Body Bathing Details (indicate cue type and reason): educated in use of long handled bath sponge for back Lower Body Bathing: Set up;Sitting/lateral leans   Upper Body Dressing : Set up;Sitting   Lower Body Dressing: Minimal assistance;Sit to/from stand Lower Body Dressing Details (indicate cue type and reason): cannot quite cross foot over opposite knee due to pain, but is not interested in AE Toilet Transfer: Supervision/safety;Ambulation Toilet Transfer Details (indicate cue type and reason): instructed husband in how to set up 3 in 1 over toilet Toileting- Clothing Manipulation and Hygiene: Supervision/safety;Sit to/from stand Toileting - Clothing Manipulation Details (indicate cue type and reason): instructed to avoid twisting with pericare, toilet tongs if needed   Tub/Shower Transfer Details (indicate cue type and reason): educated husband in how to set up 3 in 1 as shower seat Functional mobility during ADLs: Supervision/safety       Vision Patient Visual Report: No change from baseline       Perception     Praxis      Pertinent Vitals/Pain Pain Assessment: Faces Faces Pain Scale: Hurts whole lot Pain Location: incision site Pain Descriptors / Indicators: Operative site guarding Pain Intervention(s): Repositioned;Patient requesting pain meds-RN notified     Hand Dominance Right   Extremity/Trunk Assessment Upper Extremity Assessment Upper Extremity Assessment: Overall WFL for tasks assessed   Lower Extremity Assessment Lower Extremity Assessment: Defer to PT evaluation   Cervical / Trunk Assessment Cervical / Trunk Assessment: Kyphotic   Communication Communication Communication: No difficulties  Cognition Arousal/Alertness: Awake/alert Behavior During Therapy: WFL  for tasks assessed/performed Overall Cognitive Status: Within Functional Limits for tasks assessed                                     General Comments       Exercises     Shoulder Instructions      Home Living Family/patient expects to be discharged to:: Private residence Living Arrangements: Spouse/significant other Available Help at Discharge: Family;Available 24 hours/day Type of Home: House Home Access: Level entry     Home Layout: One level     Bathroom Shower/Tub: Teacher, early years/pre: Handicapped height Bathroom Accessibility: Yes   Home Equipment: Walker - 2 wheels          Prior Functioning/Environment Level of Independence: Independent                 OT Problem List: Impaired balance (sitting and/or standing);Decreased knowledge of use of DME or AE;Pain      OT Treatment/Interventions:      OT Goals(Current goals can be found in the care plan section) Acute Rehab OT Goals Patient Stated Goal: Home today  OT Frequency:     Barriers to D/C:            Co-evaluation              AM-PAC OT "6 Clicks" Daily Activity     Outcome Measure Help from another person eating meals?: None Help from another person taking care of personal grooming?: None Help from another person toileting, which includes using toliet, bedpan, or urinal?: None Help from another person bathing (including washing, rinsing, drying)?: None   Help from another person to put on and taking off regular lower body clothing?: A Little 6 Click Score: 19   End of Session Equipment Utilized During Treatment: Gait belt Nurse Communication: Patient requests pain meds  Activity Tolerance: Patient tolerated treatment well Patient left: in bed;with call bell/phone within reach;with family/visitor present  OT Visit Diagnosis: Other abnormalities of gait and mobility (R26.89);Pain                Time: 5625-6389 OT Time Calculation (min): 17  min Charges:  OT General Charges $OT Visit: 1 Visit OT Evaluation $OT Eval Low Complexity: Goulds, OTR/L Acute Rehabilitation Services Pager: [phone] Office: [phone]  Malka So 01/08/2019, 10:13 AM

## 2019-01-08 NOTE — Discharge Summary (Signed)
Physician Discharge Summary  Patient ID: Terri Harris MRN: [id number] DOB/AGE: 01/18/1941 78 y.o.  Admit date: 01/07/2019 Discharge date: 01/08/2019  Admission Diagnoses: L1-L2 spondylosis and stenosis.  Lumbar radiculopathy.  Neurogenic claudication.  History of fusion L2-L5.  Lumbar scoliosis  Discharge Diagnoses: L1-L2 spondylosis and stenosis.  Lumbar radiculopathy.  Neurogenic claudication.  History of fusion L2-L5.  Lumbar scoliosis. Active Problems:   Lumbar stenosis   Discharged Condition: good  Hospital Course: Patient was admitted to undergo anterolateral decompression at L1-L2 which he tolerated well.  Her incision is clean and dry.  Postoperative chest x-ray demonstrates no evidence of pneumothorax.  Consults: None  Significant Diagnostic Studies: None  Treatments: surgery: Anterolateral decompression L1-L2 with lateral plate fixation.  Discharge Exam: Blood pressure (!) 149/69, pulse 73, temperature 97.7 F (36.5 C), temperature source Oral, resp. rate 18, height 5\' 4"  (1.626 m), weight 57.6 kg, SpO2 95 %. Station and gait are normal.  Incisions are clean and dry.  Disposition: Discharge disposition: 01-Home or Self Care       Discharge Instructions    Call MD for:  difficulty breathing, headache or visual disturbances   Complete by: As directed    Call MD for:  redness, tenderness, or signs of infection (pain, swelling, redness, odor or green/yellow discharge around incision site)   Complete by: As directed    Call MD for:  severe uncontrolled pain   Complete by: As directed    Call MD for:  temperature >100.4   Complete by: As directed    Diet - low sodium heart healthy   Complete by: As directed    Discharge instructions   Complete by: As directed    Okay to shower. Do not apply salves or appointments to incision. No heavy lifting with the upper extremities greater than 15 pounds. May resume driving when not requiring pain medication and patient feels  comfortable with doing so.   Incentive spirometry RT   Complete by: As directed    Increase activity slowly   Complete by: As directed      Allergies as of 01/08/2019      Reactions   Codeine Nausea And Vomiting   Fosamax [alendronate Sodium] Other (See Comments)   gastritis      Medication List    TAKE these medications   acetaminophen 500 MG tablet Commonly known as: TYLENOL Take 1,000 mg by mouth every 6 (six) hours as needed for mild pain or headache.   alum & mag hydroxide-simeth 200-200-20 MG/5ML suspension Commonly known as: MAALOX/MYLANTA Take 15 mLs by mouth every 6 (six) hours as needed for indigestion or heartburn.   amLODipine 5 MG tablet Commonly known as: NORVASC TAKE 1 TABLET BY MOUTH EVERY DAY   Calcium-D 600-400 MG-UNIT Tabs Take 2 tablets by mouth daily.   methocarbamol 500 MG tablet Commonly known as: ROBAXIN Take 1 tablet (500 mg total) by mouth every 6 (six) hours as needed for muscle spasms.   oxyCODONE-acetaminophen 5-325 MG tablet Commonly known as: PERCOCET/ROXICET 1 tid prn What changed:   how much to take  how to take this  when to take this  reasons to take this  additional instructions   oxyCODONE-acetaminophen 5-325 MG tablet Commonly known as: PERCOCET/ROXICET 1 tid prn What changed: Another medication with the same name was changed. Make sure you understand how and when to take each.   oxyCODONE-acetaminophen 5-325 MG tablet Commonly known as: PERCOCET/ROXICET One tid prn What changed: Another medication with the same name  was changed. Make sure you understand how and when to take each.   pantoprazole 40 MG tablet Commonly known as: PROTONIX TAKE 1 TABLET BY MOUTH EVERY DAY   Reclast 5 MG/100ML Soln injection Generic drug: zoledronic acid Inject 5 mg into the vein once.        Signed: Earleen Newport 01/08/2019, 9:39 AM

## 2019-01-08 NOTE — Discharge Instructions (Signed)
°  Call Your Doctor If Any of These Occur Redness, drainage, or swelling at the wound.  Temperature greater than 101 degrees. Severe pain not relieved by pain medication. Increased difficulty ambulating Incision starts to come apart. Follow Up Appt Call today for appointment in 3 weeks ([phone]) or for problems.  If you have any hardware placed in your spine, you will need an x-ray before your appointment.

## 2019-01-08 NOTE — Plan of Care (Signed)
Patient alert and oriented, mae's well, voiding adequate amount of urine, swallowing without difficulty, no c/o pain at time of discharge. Patient discharged home with family. Script and discharged instructions given to patient. Patient and family stated understanding of instructions given. Patient has an appointment with Dr. Elsner  

## 2019-01-14 ENCOUNTER — Ambulatory Visit (INDEPENDENT_AMBULATORY_CARE_PROVIDER_SITE_OTHER): Payer: PPO | Admitting: Family Medicine

## 2019-01-14 ENCOUNTER — Other Ambulatory Visit: Payer: Self-pay

## 2019-01-14 ENCOUNTER — Other Ambulatory Visit: Payer: Self-pay | Admitting: Family Medicine

## 2019-01-14 DIAGNOSIS — I10 Essential (primary) hypertension: Secondary | ICD-10-CM

## 2019-01-14 DIAGNOSIS — M545 Low back pain, unspecified: Secondary | ICD-10-CM

## 2019-01-14 DIAGNOSIS — M810 Age-related osteoporosis without current pathological fracture: Secondary | ICD-10-CM | POA: Diagnosis not present

## 2019-01-14 DIAGNOSIS — G8929 Other chronic pain: Secondary | ICD-10-CM

## 2019-01-14 MED ORDER — OXYCODONE-ACETAMINOPHEN 5-325 MG PO TABS: ORAL_TABLET | ORAL | 0 refills | Status: DC

## 2019-01-14 NOTE — Progress Notes (Signed)
Subjective:    Patient ID: Terri Harris, female    DOB: 16-Nov-1940, 78 y.o.   MRN: [id number]  HPI This patient was seen today for chronic pain  The medication list was reviewed and updated.   -Compliance with medication: oxycodone 5-325 one TID prn  - Number patient states they take daily: 3/day  -when was the last dose patient took? This morning   The patient was advised the importance of maintaining medication and not using illegal substances with these.  Here for refills and follow up  The patient was educated that we can provide 3 monthly scripts for their medication, it is their responsibility to follow the instructions.  Side effects or complications from medications: None  Patient is aware that pain medications are meant to minimize the severity of the pain to allow their pain levels to improve to allow for better function. They are aware of that pain medications cannot totally remove their pain.  Due for UDT ( at least once per year) :   Pt recently had back surgery.   Virtual Visit via Video Note  I connected with [name] on 01/14/19 at 10:00 AM EDT by a video enabled telemedicine application and verified that I am speaking with the correct person using two identifiers.  Location: Patient: home Provider: office   I discussed the limitations of evaluation and management by telemedicine and the availability of in person appointments. The patient expressed understanding and agreed to proceed.  History of Present Illness:    Observations/Objective:   Assessment and Plan:   Follow Up Instructions:    I discussed the assessment and treatment plan with the patient. The patient was provided an opportunity to ask questions and all were answered. The patient agreed with the plan and demonstrated an understanding of the instructions.   The patient was advised to call back or seek an in-person evaluation if the symptoms worsen or if the condition fails to  improve as anticipated.  I provided 15 minutes of non-face-to-face time during this encounter.   Vicente Males, LPN  Opinion the pain medicine does help this patient she recently had back surgery since she still having a lot of pain from that but has had pain settles down she may not necessarily need 3/day but for now we will stick with 3/day she states it does help her function without it she could not do her ADLs she denies addiction she denies any side effects to it  15 minutes was spent with patient today discussing healthcare issues which they came.  More than 50% of this visit-total duration of visit-was spent in counseling and coordination of care.  Please see diagnosis regarding the focus of this coordination and care      Review of Systems  Constitutional: Negative for activity change and appetite change.  HENT: Negative for congestion and rhinorrhea.   Respiratory: Negative for cough and shortness of breath.   Cardiovascular: Negative for chest pain and leg swelling.  Gastrointestinal: Negative for abdominal pain, nausea and vomiting.  Skin: Negative for color change.  Neurological: Negative for dizziness and weakness.  Psychiatric/Behavioral: Negative for agitation and confusion.       Objective:   Physical Exam Today's visit was via telephone Physical exam was not possible for this visit        Assessment & Plan:  The patient was seen in followup for chronic pain. A review over at their current pain status was discussed. Drug registry was  checked. Prescriptions were given. Discussion was held regarding the importance of compliance with medication as well as pain medication contract.  Time for questions regarding pain management plan occurred. Importance of regular followup visits was discussed. Patient was informed that medication may cause drowsiness and should not be combined  with other medications/alcohol or street drugs. Patient was cautioned that  medication could cause drowsiness. If the patient feels medication is causing altered alertness then do not drive or operate dangerous equipment.  Drug registry checked 3 scripts written follow-up 3 months

## 2019-01-23 DIAGNOSIS — M48061 Spinal stenosis, lumbar region without neurogenic claudication: Secondary | ICD-10-CM | POA: Diagnosis not present

## 2019-03-01 ENCOUNTER — Other Ambulatory Visit (INDEPENDENT_AMBULATORY_CARE_PROVIDER_SITE_OTHER): Payer: PPO | Admitting: *Deleted

## 2019-03-01 DIAGNOSIS — Z23 Encounter for immunization: Secondary | ICD-10-CM

## 2019-03-05 DIAGNOSIS — M48061 Spinal stenosis, lumbar region without neurogenic claudication: Secondary | ICD-10-CM | POA: Diagnosis not present

## 2019-04-02 ENCOUNTER — Ambulatory Visit: Payer: PPO | Admitting: Family Medicine

## 2019-04-14 ENCOUNTER — Other Ambulatory Visit: Payer: Self-pay

## 2019-04-14 ENCOUNTER — Ambulatory Visit (INDEPENDENT_AMBULATORY_CARE_PROVIDER_SITE_OTHER): Payer: PPO | Admitting: Family Medicine

## 2019-04-14 DIAGNOSIS — M545 Low back pain, unspecified: Secondary | ICD-10-CM

## 2019-04-14 DIAGNOSIS — G8929 Other chronic pain: Secondary | ICD-10-CM

## 2019-04-14 DIAGNOSIS — I10 Essential (primary) hypertension: Secondary | ICD-10-CM | POA: Diagnosis not present

## 2019-04-14 MED ORDER — OXYCODONE-ACETAMINOPHEN 5-325 MG PO TABS: ORAL_TABLET | ORAL | 0 refills | Status: DC

## 2019-04-14 NOTE — Progress Notes (Addendum)
Subjective:    Patient ID: Terri Harris, female    DOB: 1940-09-12, 78 y.o.   MRN: [id number]  HPI This patient was seen today for chronic pain. Takes for back pain.  Patient relates pain medicine allows her to tolerate her back trouble much better she states it does benefit her and she wants to continue this She states over the past several months food just really does not appeal to her at times she feels that fullness in her throat but denies dysphagia she does not want to get seen by ENT currently but I told her if this does not go away over the course the next 3 to 4 weeks it is very important for her to be checked out Also encourage her to try to eat adequate amount to keep her weight up The medication list was reviewed and updated.   -Compliance with medication: takes up to 3 a day Fall Risk  04/14/2019 04/10/2018 07/10/2017 10/19/2015 04/19/2015  Falls in the past year? 0 0 No No Yes  Number falls in past yr: - 0 - - 1  Injury with Fall? - 0 - - No  Risk for fall due to : - - - - -  Follow up Falls evaluation completed Education provided - - Education provided    - Number patient states they take daily: up to 3 a day  -when was the last dose patient took? today  The patient was advised the importance of maintaining medication and not using illegal substances with these.  Here for refills and follow up  The patient was educated that we can provide 3 monthly scripts for their medication, it is their responsibility to follow the instructions.  Side effects or complications from medications: none  Patient is aware that pain medications are meant to minimize the severity of the pain to allow their pain levels to improve to allow for better function. They are aware of that pain medications cannot totally remove their pain.  Due for UDT ( at least once per year) : last one was 07/12/18.           Review of Systems  Constitutional: Negative for activity change and appetite  change.  HENT: Negative for congestion and rhinorrhea.   Respiratory: Negative for cough and shortness of breath.   Cardiovascular: Negative for chest pain and leg swelling.  Gastrointestinal: Negative for abdominal pain, nausea and vomiting.  Skin: Negative for color change.  Neurological: Negative for dizziness and weakness.  Psychiatric/Behavioral: Negative for agitation and confusion.       Objective:   Physical Exam  Today's visit was via telephone Physical exam was not possible for this visit       Assessment & Plan:  Slight weight loss patient was encouraged to improve nutrition  Laryngeal fullness-I encouraged patient to be seen by ENT she states if it does not go away in the next 3 to 4 weeks she will let us know we will set up a ENT appointment  The patient was seen in followup for chronic pain. A review over at their current pain status was discussed. Drug registry was checked. Prescriptions were given. Discussion was held regarding the importance of compliance with medication as well as pain medication contract.  Time for questions regarding pain management plan occurred. Importance of regular followup visits was discussed. Patient was informed that medication may cause drowsiness and should not be combined  with other medications/alcohol or street drugs. Patient was cautioned that  medication could cause drowsiness. If the patient feels medication is causing altered alertness then do not drive or operate dangerous equipment. Patient overall does well with medicine allows her to function better she would like to continue it 3 prescriptions sent in today electronically

## 2019-04-23 ENCOUNTER — Telehealth: Payer: Self-pay | Admitting: Family Medicine

## 2019-04-23 NOTE — Telephone Encounter (Signed)
Please date your signature on Reclast order so that I may fax  In basket on wall

## 2019-04-27 NOTE — Telephone Encounter (Signed)
This was completed thank you 

## 2019-05-16 ENCOUNTER — Encounter (HOSPITAL_COMMUNITY): Admission: RE | Admit: 2019-05-16 | Payer: PPO | Source: Ambulatory Visit

## 2019-05-19 ENCOUNTER — Encounter (HOSPITAL_COMMUNITY): Payer: Self-pay

## 2019-05-19 ENCOUNTER — Other Ambulatory Visit: Payer: Self-pay

## 2019-05-19 ENCOUNTER — Encounter (HOSPITAL_COMMUNITY)
Admission: RE | Admit: 2019-05-19 | Discharge: 2019-05-19 | Disposition: A | Discharge status: Home / Self Care | Payer: PPO | Source: Ambulatory Visit | Attending: Family Medicine | Admitting: Family Medicine

## 2019-05-19 DIAGNOSIS — M810 Age-related osteoporosis without current pathological fracture: Secondary | ICD-10-CM | POA: Insufficient documentation

## 2019-05-19 LAB — POCT I-STAT, CHEM 8
BUN: 16 mg/dL (ref 8–23)
Calcium, Ion: 1.15 mmol/L (ref 1.15–1.40)
Chloride: 103 mmol/L (ref 98–111)
Creatinine, Ser: 0.6 mg/dL (ref 0.44–1.00)
Glucose, Bld: 81 mg/dL (ref 70–99)
HCT: 45 % (ref 36.0–46.0)
Hemoglobin: 15.3 g/dL — ABNORMAL HIGH (ref 12.0–15.0)
Potassium: 4.5 mmol/L (ref 3.5–5.1)
Sodium: 137 mmol/L (ref 135–145)
TCO2: 27 mmol/L (ref 22–32)

## 2019-05-19 MED ORDER — ZOLEDRONIC ACID 5 MG/100ML IV SOLN
5.0000 mg | Freq: Once | INTRAVENOUS | Status: AC
  Administered 2019-05-19: 14:00:00 5 mg via INTRAVENOUS

## 2019-05-19 MED ORDER — SODIUM CHLORIDE 0.9 % IV SOLN: Freq: Once | INTRAVENOUS | Status: AC

## 2019-05-20 ENCOUNTER — Inpatient Hospital Stay (HOSPITAL_COMMUNITY): Admission: RE | Admit: 2019-05-20 | Payer: PPO | Source: Ambulatory Visit

## 2019-05-20 ENCOUNTER — Encounter (HOSPITAL_COMMUNITY): Admission: RE | Admit: 2019-05-20 | Payer: PPO | Source: Ambulatory Visit

## 2019-06-16 ENCOUNTER — Ambulatory Visit (INDEPENDENT_AMBULATORY_CARE_PROVIDER_SITE_OTHER): Payer: PPO | Admitting: Family Medicine

## 2019-06-16 ENCOUNTER — Encounter: Payer: Self-pay | Admitting: Family Medicine

## 2019-06-16 ENCOUNTER — Other Ambulatory Visit: Payer: Self-pay

## 2019-06-16 DIAGNOSIS — M545 Low back pain, unspecified: Secondary | ICD-10-CM

## 2019-06-16 DIAGNOSIS — G8929 Other chronic pain: Secondary | ICD-10-CM

## 2019-06-16 DIAGNOSIS — I10 Essential (primary) hypertension: Secondary | ICD-10-CM

## 2019-06-16 MED ORDER — OXYCODONE-ACETAMINOPHEN 5-325 MG PO TABS: ORAL_TABLET | ORAL | 0 refills | Status: DC

## 2019-06-16 MED ORDER — AMLODIPINE BESYLATE 5 MG PO TABS: 5.0000 mg | ORAL_TABLET | Freq: Every day | ORAL | 1 refills | Status: DC

## 2019-06-16 NOTE — Progress Notes (Signed)
Subjective:    Patient ID: Terri Harris, female    DOB: 05-04-1941, 79 y.o.   MRN: [id number]  HPI This patient was seen today for chronic pain  The medication list was reviewed and updated. Patient relates pain medicine does allow her to function better without it she did have a difficult time she states her pain is mainly in her back but sometimes radiates down the legs denies numbness tingling.  States her other health issues are stable currently.  Drug registry was checked.  -Compliance with medication: yes  - Number patient states they take daily: three a day  -when was the last dose patient took? today  The patient was advised the importance of maintaining medication and not using illegal substances with these.  Here for refills and follow up  The patient was educated that we can provide 3 monthly scripts for their medication, it is their responsibility to follow the instructions.  Side effects or complications from medications: none  Patient is aware that pain medications are meant to minimize the severity of the pain to allow their pain levels to improve to allow for better function. They are aware of that pain medications cannot totally remove their pain.  Due for UDT ( at least once per year) : last one was 07/12/18  Virtual Visit via Telephone Note  I connected with Terri Harris on 06/16/19 at 10:30 AM EST by telephone and verified that I am speaking with the correct person using two identifiers.  Location: Patient: home Provider: office   I discussed the limitations, risks, security and privacy concerns of performing an evaluation and management service by telephone and the availability of in person appointments. I also discussed with the patient that there may be a patient responsible charge related to this service. The patient expressed understanding and agreed to proceed.   History of Present Illness:    Observations/Objective:   Assessment and  Plan:   Follow Up Instructions:    I discussed the assessment and treatment plan with the patient. The patient was provided an opportunity to ask questions and all were answered. The patient agreed with the plan and demonstrated an understanding of the instructions.   The patient was advised to call back or seek an in-person evaluation if the symptoms worsen or if the condition fails to improve as anticipated.  I provided 16 minutes of non-face-to-face time during this encounter.           Review of Systems  Constitutional: Negative for activity change and appetite change.  HENT: Negative for congestion and rhinorrhea.   Respiratory: Negative for cough and shortness of breath.   Cardiovascular: Negative for chest pain and leg swelling.  Gastrointestinal: Negative for abdominal pain, nausea and vomiting.  Musculoskeletal: Positive for arthralgias and back pain.  Skin: Negative for color change.  Neurological: Negative for dizziness and weakness.  Psychiatric/Behavioral: Negative for agitation and confusion.       Objective:   Physical Exam  Today's visit was via telephone Physical exam was not possible for this visit       Assessment & Plan:  Covid prevention plus Covid vaccine discussed in detail Pain medications were sent into her pharmacy Drug registry checked Patient will do follow-up visit in 3 months No lab work currently The patient was seen in followup for chronic pain. A review over at their current pain status was discussed. Drug registry was checked. Prescriptions were given. Discussion was held regarding the importance of  compliance with medication as well as pain medication contract.  Time for questions regarding pain management plan occurred. Importance of regular followup visits was discussed. Patient was informed that medication may cause drowsiness and should not be combined  with other medications/alcohol or street drugs. Patient was cautioned  that medication could cause drowsiness. If the patient feels medication is causing altered alertness then do not drive or operate dangerous equipment.

## 2019-06-27 DIAGNOSIS — M545 Low back pain: Secondary | ICD-10-CM | POA: Diagnosis not present

## 2019-06-27 DIAGNOSIS — G8929 Other chronic pain: Secondary | ICD-10-CM | POA: Diagnosis not present

## 2019-09-03 DIAGNOSIS — M545 Low back pain: Secondary | ICD-10-CM | POA: Diagnosis not present

## 2019-09-09 ENCOUNTER — Ambulatory Visit (INDEPENDENT_AMBULATORY_CARE_PROVIDER_SITE_OTHER): Payer: PPO | Admitting: Family Medicine

## 2019-09-09 ENCOUNTER — Other Ambulatory Visit: Payer: Self-pay

## 2019-09-09 VITALS — BP 128/80 | Temp 97.3°F | Wt 125.6 lb

## 2019-09-09 DIAGNOSIS — R5383 Other fatigue: Secondary | ICD-10-CM

## 2019-09-09 DIAGNOSIS — Z13820 Encounter for screening for osteoporosis: Secondary | ICD-10-CM | POA: Diagnosis not present

## 2019-09-09 DIAGNOSIS — Z79891 Long term (current) use of opiate analgesic: Secondary | ICD-10-CM | POA: Diagnosis not present

## 2019-09-09 DIAGNOSIS — D519 Vitamin B12 deficiency anemia, unspecified: Secondary | ICD-10-CM

## 2019-09-09 DIAGNOSIS — Z1239 Encounter for other screening for malignant neoplasm of breast: Secondary | ICD-10-CM

## 2019-09-09 DIAGNOSIS — E611 Iron deficiency: Secondary | ICD-10-CM

## 2019-09-09 DIAGNOSIS — I10 Essential (primary) hypertension: Secondary | ICD-10-CM

## 2019-09-09 DIAGNOSIS — M810 Age-related osteoporosis without current pathological fracture: Secondary | ICD-10-CM

## 2019-09-09 DIAGNOSIS — D518 Other vitamin B12 deficiency anemias: Secondary | ICD-10-CM

## 2019-09-09 DIAGNOSIS — Z79899 Other long term (current) drug therapy: Secondary | ICD-10-CM | POA: Diagnosis not present

## 2019-09-09 DIAGNOSIS — E785 Hyperlipidemia, unspecified: Secondary | ICD-10-CM | POA: Diagnosis not present

## 2019-09-09 LAB — MED LIST OPTION NOT SELECTED

## 2019-09-09 MED ORDER — OXYCODONE-ACETAMINOPHEN 5-325 MG PO TABS: ORAL_TABLET | ORAL | 0 refills | Status: DC

## 2019-09-09 NOTE — Progress Notes (Signed)
Subjective:    Patient ID: Terri Harris, female    DOB: 08-24-1940, 79 y.o.   MRN: [id number]  HPI   This patient was seen today for chronic pain  The medication list was reviewed and updated.   -Compliance with medication: yes  - Number patient states they take daily: 3 per day   -when was the last dose patient took? Last night   The patient was advised the importance of maintaining medication and not using illegal substances with these.  Here for refills and follow up  The patient was educated that we can provide 3 monthly scripts for their medication, it is their responsibility to follow the instructions.  Side effects or complications from medications: none  Patient is aware that pain medications are meant to minimize the severity of the pain to allow their pain levels to improve to allow for better function. They are aware of that pain medications cannot totally remove their pain.  Due for UDT ( at least once per year) : 09/09/19  Encounter for long-term methadone use  Encounter for long-term opiate analgesic use - Plan: ToxASSURE Select 68 (MW), Urine  Essential hypertension - Plan: CBC with Differential/Platelet, Basic metabolic panel  Osteoporosis without current pathological fracture, unspecified osteoporosis type - Plan: B12  Hyperlipidemia, unspecified hyperlipidemia type - Plan: Lipid panel  Screening for osteoporosis - Plan: DG Bone Density  Encounter for screening for malignant neoplasm of breast, unspecified screening modality - Plan: MM Digital Screening, CANCELED: MM Digital Diagnostic Bilat  High risk medication use - Plan: Hepatic function panel  Fatigue, unspecified type - Plan: B12, Ferritin     Review of Systems  Constitutional: Negative for activity change and appetite change.  HENT: Negative for congestion and rhinorrhea.   Respiratory: Negative for cough and shortness of breath.   Cardiovascular: Negative for chest pain and leg  swelling.  Gastrointestinal: Negative for abdominal pain, nausea and vomiting.  Musculoskeletal: Positive for back pain. Negative for arthralgias.  Skin: Negative for color change.  Neurological: Negative for dizziness and weakness.  Psychiatric/Behavioral: Negative for agitation and confusion.       Objective:   Physical Exam Vitals reviewed.  Constitutional:      General: She is not in acute distress. HENT:     Head: Normocephalic.  Cardiovascular:     Rate and Rhythm: Normal rate and regular rhythm.     Heart sounds: Normal heart sounds. No murmur.  Pulmonary:     Effort: Pulmonary effort is normal.     Breath sounds: Normal breath sounds.  Lymphadenopathy:     Cervical: No cervical adenopathy.  Neurological:     Mental Status: She is alert.  Psychiatric:        Behavior: Behavior normal.   Blood pressure rechecked and was good        Assessment & Plan:  1. Encounter for long-term methadone use error  2. Encounter for long-term opiate analgesic use The patient was seen in followup for chronic pain. A review over at their current pain status was discussed. Drug registry was checked. Prescriptions were given.  Regular follow-up recommended. Discussion was held regarding the importance of compliance with medication as well as pain medication contract.  Patient was informed that medication may cause drowsiness and should not be combined  with other medications/alcohol or street drugs. If the patient feels medication is causing altered alertness then do not drive or operate dangerous equipment.   - ToxASSURE Select 13 (MW), Urine  3. Essential hypertension Blood pressure good control continue current medication watch salt diet stay active - CBC with Differential/Platelet - Basic metabolic panel  4. Osteoporosis without current pathological fracture, unspecified osteoporosis type We will check bone density await the results on this. - B12  5. Hyperlipidemia,  unspecified hyperlipidemia type Hyperlipidemia check lipid profile - Lipid panel  6. Screening for osteoporosis Patient with history of osteoporosis check bone density - DG Bone Density  7. Encounter for screening for malignant neoplasm of breast, unspecified screening modality Mammogram is recommended - MM Digital Screening  8. High risk medication use Medication therefore check liver profile - Hepatic function panel  9. Fatigue, unspecified type Check B12 ferritin - B12 - Ferritin 30 minutes spent with patient reviewing over multiple issues ordering appropriate tests ordering appropriate medicine checking drug registry and documentation  Patient was counseled regarding pain medication and proper use of pain medicine and avoiding excessive use in avoiding addiction issues as well as abuse issues and proper storage of medicine

## 2019-09-11 LAB — SPECIMEN STATUS REPORT

## 2019-09-11 LAB — TOXASSURE SELECT 13 (MW), URINE

## 2019-09-18 ENCOUNTER — Ambulatory Visit (HOSPITAL_COMMUNITY)
Admission: RE | Admit: 2019-09-18 | Discharge: 2019-09-18 | Disposition: A | Discharge status: Home / Self Care | Payer: PPO | Source: Ambulatory Visit | Attending: Family Medicine | Admitting: Family Medicine

## 2019-09-18 ENCOUNTER — Other Ambulatory Visit: Payer: Self-pay

## 2019-09-18 DIAGNOSIS — Z13820 Encounter for screening for osteoporosis: Secondary | ICD-10-CM | POA: Diagnosis not present

## 2019-09-18 DIAGNOSIS — M810 Age-related osteoporosis without current pathological fracture: Secondary | ICD-10-CM | POA: Diagnosis not present

## 2019-09-18 DIAGNOSIS — E559 Vitamin D deficiency, unspecified: Secondary | ICD-10-CM | POA: Diagnosis not present

## 2019-09-18 DIAGNOSIS — Z1231 Encounter for screening mammogram for malignant neoplasm of breast: Secondary | ICD-10-CM | POA: Insufficient documentation

## 2019-09-18 DIAGNOSIS — R5383 Other fatigue: Secondary | ICD-10-CM | POA: Diagnosis not present

## 2019-09-18 DIAGNOSIS — Z79899 Other long term (current) drug therapy: Secondary | ICD-10-CM | POA: Diagnosis not present

## 2019-09-18 DIAGNOSIS — M85851 Other specified disorders of bone density and structure, right thigh: Secondary | ICD-10-CM | POA: Diagnosis not present

## 2019-09-18 DIAGNOSIS — E785 Hyperlipidemia, unspecified: Secondary | ICD-10-CM | POA: Diagnosis not present

## 2019-09-18 DIAGNOSIS — I10 Essential (primary) hypertension: Secondary | ICD-10-CM | POA: Diagnosis not present

## 2019-09-19 LAB — BASIC METABOLIC PANEL
BUN/Creatinine Ratio: 14 (ref 12–28)
BUN: 10 mg/dL (ref 8–27)
CO2: 24 mmol/L (ref 20–29)
Calcium: 9.7 mg/dL (ref 8.7–10.3)
Chloride: 98 mmol/L (ref 96–106)
Creatinine, Ser: 0.73 mg/dL (ref 0.57–1.00)
GFR calc Af Amer: 91 mL/min/{1.73_m2} (ref >59)
GFR calc non Af Amer: 79 mL/min/{1.73_m2} (ref >59)
Glucose: 90 mg/dL (ref 65–99)
Potassium: 4.7 mmol/L (ref 3.5–5.2)
Sodium: 140 mmol/L (ref 134–144)

## 2019-09-19 LAB — CBC WITH DIFFERENTIAL/PLATELET
Basophils Absolute: 0.1 10*3/uL (ref 0.0–0.2)
Basos: 1 %
EOS (ABSOLUTE): 0 10*3/uL (ref 0.0–0.4)
Eos: 0 %
Hematocrit: 45 % (ref 34.0–46.6)
Hemoglobin: 15.4 g/dL (ref 11.1–15.9)
Immature Grans (Abs): 0 10*3/uL (ref 0.0–0.1)
Immature Granulocytes: 0 %
Lymphocytes Absolute: 1.3 10*3/uL (ref 0.7–3.1)
Lymphs: 17 %
MCH: 33.8 pg — ABNORMAL HIGH (ref 26.6–33.0)
MCHC: 34.2 g/dL (ref 31.5–35.7)
MCV: 99 fL — ABNORMAL HIGH (ref 79–97)
Monocytes Absolute: 0.8 10*3/uL (ref 0.1–0.9)
Monocytes: 10 %
Neutrophils Absolute: 5.6 10*3/uL (ref 1.4–7.0)
Neutrophils: 72 %
Platelets: 313 10*3/uL (ref 150–450)
RBC: 4.55 x10E6/uL (ref 3.77–5.28)
RDW: 12.5 % (ref 11.7–15.4)
WBC: 7.8 10*3/uL (ref 3.4–10.8)

## 2019-09-19 LAB — HEPATIC FUNCTION PANEL
ALT: 13 IU/L (ref 0–32)
AST: 25 IU/L (ref 0–40)
Albumin: 5.4 g/dL — ABNORMAL HIGH (ref 3.7–4.7)
Alkaline Phosphatase: 70 IU/L (ref 39–117)
Bilirubin Total: 0.7 mg/dL (ref 0.0–1.2)
Bilirubin, Direct: 0.2 mg/dL (ref 0.00–0.40)
Total Protein: 8.5 g/dL (ref 6.0–8.5)

## 2019-09-19 LAB — LIPID PANEL
Chol/HDL Ratio: 2.5 ratio (ref 0.0–4.4)
Cholesterol, Total: 237 mg/dL — ABNORMAL HIGH (ref 100–199)
HDL: 96 mg/dL (ref >39)
LDL Chol Calc (NIH): 121 mg/dL — ABNORMAL HIGH (ref 0–99)
Triglycerides: 117 mg/dL (ref 0–149)
VLDL Cholesterol Cal: 20 mg/dL (ref 5–40)

## 2019-09-19 LAB — VITAMIN B12: Vitamin B-12: 88 pg/mL — ABNORMAL LOW (ref 232–1245)

## 2019-09-19 LAB — FERRITIN: Ferritin: 272 ng/mL — ABNORMAL HIGH (ref 15–150)

## 2019-09-22 ENCOUNTER — Other Ambulatory Visit: Payer: Self-pay | Admitting: Family Medicine

## 2019-09-22 ENCOUNTER — Other Ambulatory Visit: Payer: Self-pay | Admitting: *Deleted

## 2019-09-24 ENCOUNTER — Other Ambulatory Visit: Payer: Self-pay

## 2019-09-24 ENCOUNTER — Other Ambulatory Visit (INDEPENDENT_AMBULATORY_CARE_PROVIDER_SITE_OTHER): Payer: PPO | Admitting: *Deleted

## 2019-09-24 DIAGNOSIS — E538 Deficiency of other specified B group vitamins: Secondary | ICD-10-CM

## 2019-09-24 MED ORDER — CYANOCOBALAMIN 1000 MCG/ML IJ SOLN
1000.0000 ug | Freq: Once | INTRAMUSCULAR | Status: AC
  Administered 2019-09-24: 1000 ug via INTRAMUSCULAR

## 2019-09-29 LAB — SPECIMEN STATUS REPORT

## 2019-09-29 LAB — VITAMIN D 25 HYDROXY (VIT D DEFICIENCY, FRACTURES): Vit D, 25-Hydroxy: 44.4 ng/mL (ref 30.0–100.0)

## 2019-10-01 ENCOUNTER — Other Ambulatory Visit (INDEPENDENT_AMBULATORY_CARE_PROVIDER_SITE_OTHER): Payer: PPO | Admitting: *Deleted

## 2019-10-01 ENCOUNTER — Other Ambulatory Visit: Payer: Self-pay

## 2019-10-01 DIAGNOSIS — E538 Deficiency of other specified B group vitamins: Secondary | ICD-10-CM

## 2019-10-01 MED ORDER — CYANOCOBALAMIN 1000 MCG/ML IJ SOLN
1000.0000 ug | Freq: Once | INTRAMUSCULAR | Status: AC
  Administered 2019-10-01: 1000 ug via INTRAMUSCULAR

## 2019-10-08 ENCOUNTER — Other Ambulatory Visit: Payer: PPO

## 2019-10-09 ENCOUNTER — Other Ambulatory Visit (INDEPENDENT_AMBULATORY_CARE_PROVIDER_SITE_OTHER): Payer: PPO | Admitting: *Deleted

## 2019-10-09 ENCOUNTER — Other Ambulatory Visit: Payer: Self-pay

## 2019-10-09 DIAGNOSIS — E538 Deficiency of other specified B group vitamins: Secondary | ICD-10-CM

## 2019-10-09 MED ORDER — CYANOCOBALAMIN 1000 MCG/ML IJ SOLN
1000.0000 ug | Freq: Once | INTRAMUSCULAR | Status: AC
  Administered 2019-10-09: 1000 ug via INTRAMUSCULAR

## 2019-10-16 ENCOUNTER — Other Ambulatory Visit: Payer: Self-pay

## 2019-10-16 ENCOUNTER — Other Ambulatory Visit (INDEPENDENT_AMBULATORY_CARE_PROVIDER_SITE_OTHER): Payer: PPO

## 2019-10-16 DIAGNOSIS — E611 Iron deficiency: Secondary | ICD-10-CM

## 2019-10-16 MED ORDER — CYANOCOBALAMIN 1000 MCG/ML IJ SOLN
1000.0000 ug | Freq: Once | INTRAMUSCULAR | Status: AC
  Administered 2019-10-16: 1000 ug via INTRAMUSCULAR

## 2019-10-22 ENCOUNTER — Other Ambulatory Visit (INDEPENDENT_AMBULATORY_CARE_PROVIDER_SITE_OTHER): Payer: PPO | Admitting: *Deleted

## 2019-10-22 ENCOUNTER — Other Ambulatory Visit: Payer: Self-pay

## 2019-10-22 DIAGNOSIS — E538 Deficiency of other specified B group vitamins: Secondary | ICD-10-CM

## 2019-10-22 MED ORDER — CYANOCOBALAMIN 1000 MCG/ML IJ SOLN
1000.0000 ug | Freq: Once | INTRAMUSCULAR | Status: AC
  Administered 2019-10-22: 1000 ug via INTRAMUSCULAR

## 2019-10-29 ENCOUNTER — Other Ambulatory Visit: Payer: Self-pay

## 2019-10-29 ENCOUNTER — Ambulatory Visit (INDEPENDENT_AMBULATORY_CARE_PROVIDER_SITE_OTHER): Payer: PPO | Admitting: Family Medicine

## 2019-10-29 DIAGNOSIS — E538 Deficiency of other specified B group vitamins: Secondary | ICD-10-CM | POA: Diagnosis not present

## 2019-10-29 MED ORDER — CYANOCOBALAMIN 1000 MCG/ML IJ SOLN
1000.0000 ug | Freq: Once | INTRAMUSCULAR | Status: AC
  Administered 2019-10-29: 1000 ug via INTRAMUSCULAR

## 2019-10-29 MED ORDER — CYANOCOBALAMIN 1000 MCG/ML IJ SOLN: 1000.0000 ug | Freq: Once | INTRAMUSCULAR | 0 refills | Status: DC

## 2019-10-30 ENCOUNTER — Encounter: Payer: Self-pay | Admitting: *Deleted

## 2019-11-06 ENCOUNTER — Other Ambulatory Visit: Payer: Self-pay

## 2019-11-06 ENCOUNTER — Other Ambulatory Visit (INDEPENDENT_AMBULATORY_CARE_PROVIDER_SITE_OTHER): Payer: PPO

## 2019-11-06 DIAGNOSIS — E538 Deficiency of other specified B group vitamins: Secondary | ICD-10-CM

## 2019-11-06 MED ORDER — CYANOCOBALAMIN 1000 MCG/ML IJ SOLN
1000.0000 ug | Freq: Once | INTRAMUSCULAR | Status: AC
  Administered 2019-11-06: 1000 ug via INTRAMUSCULAR

## 2019-11-12 ENCOUNTER — Other Ambulatory Visit (INDEPENDENT_AMBULATORY_CARE_PROVIDER_SITE_OTHER): Payer: PPO

## 2019-11-12 ENCOUNTER — Other Ambulatory Visit: Payer: Self-pay

## 2019-11-12 DIAGNOSIS — E538 Deficiency of other specified B group vitamins: Secondary | ICD-10-CM

## 2019-11-12 MED ORDER — CYANOCOBALAMIN 1000 MCG/ML IJ SOLN
1000.0000 ug | Freq: Once | INTRAMUSCULAR | Status: AC
  Administered 2019-11-12: 1000 ug via INTRAMUSCULAR

## 2019-11-13 NOTE — Progress Notes (Signed)
error 

## 2019-11-15 ENCOUNTER — Other Ambulatory Visit: Payer: Self-pay | Admitting: Family Medicine

## 2019-11-19 ENCOUNTER — Other Ambulatory Visit (INDEPENDENT_AMBULATORY_CARE_PROVIDER_SITE_OTHER): Payer: PPO | Admitting: *Deleted

## 2019-11-19 ENCOUNTER — Other Ambulatory Visit: Payer: Self-pay

## 2019-11-19 DIAGNOSIS — E538 Deficiency of other specified B group vitamins: Secondary | ICD-10-CM | POA: Diagnosis not present

## 2019-11-19 MED ORDER — CYANOCOBALAMIN 1000 MCG/ML IJ SOLN
1000.0000 ug | Freq: Once | INTRAMUSCULAR | Status: AC
  Administered 2019-11-19: 1000 ug via INTRAMUSCULAR

## 2019-12-09 ENCOUNTER — Ambulatory Visit (INDEPENDENT_AMBULATORY_CARE_PROVIDER_SITE_OTHER): Payer: PPO | Admitting: Family Medicine

## 2019-12-09 ENCOUNTER — Other Ambulatory Visit: Payer: Self-pay

## 2019-12-09 VITALS — BP 130/82 | Temp 97.4°F | Ht 65.0 in | Wt 127.0 lb

## 2019-12-09 DIAGNOSIS — I10 Essential (primary) hypertension: Secondary | ICD-10-CM | POA: Diagnosis not present

## 2019-12-09 DIAGNOSIS — G8929 Other chronic pain: Secondary | ICD-10-CM

## 2019-12-09 DIAGNOSIS — E538 Deficiency of other specified B group vitamins: Secondary | ICD-10-CM | POA: Insufficient documentation

## 2019-12-09 DIAGNOSIS — M1990 Unspecified osteoarthritis, unspecified site: Secondary | ICD-10-CM | POA: Diagnosis not present

## 2019-12-09 MED ORDER — OXYCODONE-ACETAMINOPHEN 5-325 MG PO TABS: ORAL_TABLET | ORAL | 0 refills | Status: DC

## 2019-12-09 NOTE — Progress Notes (Signed)
Subjective:    Patient ID: Terri Harris, female    DOB: December 29, 1940, 79 y.o.   MRN: [id number]  HPI  This patient was seen today for chronic pain  The medication list was reviewed and updated.   -Compliance with medication: yes  - Number patient states they take daily: 3  -when was the last dose patient took? This am  The patient was advised the importance of maintaining medication and not using illegal substances with these.  Here for refills and follow up  The patient was educated that we can provide 3 monthly scripts for their medication, it is their responsibility to follow the instructions.  Side effects or complications from medications: none  Patient is aware that pain medications are meant to minimize the severity of the pain to allow their pain levels to improve to allow for better function. They are aware of that pain medications cannot totally remove their pain.  Due for UDT ( at least once per year) : 08/2019  Scale of 1 to 10 ( 1 is least 10 is most) Your pain level without the medicine: 9 Your pain level with medication 2  Scale 1 to 10 ( 1-helps very little, 10 helps very well) How well does your pain medication reduce your pain so you can function better through out the day? 8    Vitamin B12 deficiency - Plan: Vitamin B12  Essential hypertension - Plan: Basic metabolic panel  Arthritis     Review of Systems  Constitutional: Negative for activity change and appetite change.  HENT: Negative for congestion and rhinorrhea.   Respiratory: Negative for cough and shortness of breath.   Cardiovascular: Negative for chest pain and leg swelling.  Gastrointestinal: Negative for abdominal pain, nausea and vomiting.  Skin: Negative for color change.  Neurological: Negative for dizziness and weakness.  Psychiatric/Behavioral: Negative for agitation and confusion.       Objective:   Physical Exam Vitals reviewed.  Constitutional:      General: She is not  in acute distress. HENT:     Head: Normocephalic.  Cardiovascular:     Rate and Rhythm: Normal rate and regular rhythm.     Heart sounds: Normal heart sounds. No murmur heard.   Pulmonary:     Effort: Pulmonary effort is normal.     Breath sounds: Normal breath sounds.  Lymphadenopathy:     Cervical: No cervical adenopathy.  Neurological:     Mental Status: She is alert.  Psychiatric:        Behavior: Behavior normal.    Patient has significant arthralgias in her hands knees back Patient has also been under the care of neurosurgery for her back Rest and reflux under good control Recently completed B12 shots        Assessment & Plan:  1. Vitamin B12 deficiency Check B12 she is stopped taking her B12 shots and taking oral B12 - Vitamin B12  2. Essential hypertension Blood pressure under decent control watching [place] diet staying physically active. - Basic metabolic panel  3. Arthritis Arthritis issues along with chronic low back pain continue opioids patient denies abusing them  4. Encounter for chronic pain management The patient was seen in followup for chronic pain. A review over at their current pain status was discussed. Drug registry was checked. Prescriptions were given.  Regular follow-up recommended. Discussion was held regarding the importance of compliance with medication as well as pain medication contract.  Patient was informed that medication may  cause drowsiness and should not be combined  with other medications/alcohol or street drugs. If the patient feels medication is causing altered alertness then do not drive or operate dangerous equipment.

## 2020-03-12 DIAGNOSIS — I10 Essential (primary) hypertension: Secondary | ICD-10-CM | POA: Diagnosis not present

## 2020-03-12 DIAGNOSIS — E538 Deficiency of other specified B group vitamins: Secondary | ICD-10-CM | POA: Diagnosis not present

## 2020-03-13 LAB — BASIC METABOLIC PANEL
BUN/Creatinine Ratio: 25 (ref 12–28)
BUN: 14 mg/dL (ref 8–27)
CO2: 25 mmol/L (ref 20–29)
Calcium: 9.9 mg/dL (ref 8.7–10.3)
Chloride: 99 mmol/L (ref 96–106)
Creatinine, Ser: 0.56 mg/dL — ABNORMAL LOW (ref 0.57–1.00)
GFR calc Af Amer: 103 mL/min/{1.73_m2} (ref >59)
GFR calc non Af Amer: 90 mL/min/{1.73_m2} (ref >59)
Glucose: 104 mg/dL — ABNORMAL HIGH (ref 65–99)
Potassium: 4.3 mmol/L (ref 3.5–5.2)
Sodium: 138 mmol/L (ref 134–144)

## 2020-03-13 LAB — VITAMIN B12: Vitamin B-12: 943 pg/mL (ref 232–1245)

## 2020-03-17 ENCOUNTER — Ambulatory Visit: Payer: PPO | Admitting: Family Medicine

## 2020-03-18 ENCOUNTER — Encounter: Payer: Self-pay | Admitting: Family Medicine

## 2020-03-18 ENCOUNTER — Other Ambulatory Visit: Payer: Self-pay

## 2020-03-18 ENCOUNTER — Ambulatory Visit (INDEPENDENT_AMBULATORY_CARE_PROVIDER_SITE_OTHER): Payer: PPO | Admitting: Family Medicine

## 2020-03-18 VITALS — BP 136/68 | HR 102 | Temp 97.5°F | Ht 65.0 in | Wt 123.0 lb

## 2020-03-18 DIAGNOSIS — R209 Unspecified disturbances of skin sensation: Secondary | ICD-10-CM | POA: Diagnosis not present

## 2020-03-18 DIAGNOSIS — S6991XA Unspecified injury of right wrist, hand and finger(s), initial encounter: Secondary | ICD-10-CM

## 2020-03-18 DIAGNOSIS — G8929 Other chronic pain: Secondary | ICD-10-CM

## 2020-03-18 DIAGNOSIS — Z23 Encounter for immunization: Secondary | ICD-10-CM

## 2020-03-18 DIAGNOSIS — M79641 Pain in right hand: Secondary | ICD-10-CM

## 2020-03-18 MED ORDER — OXYCODONE-ACETAMINOPHEN 5-325 MG PO TABS: ORAL_TABLET | ORAL | 0 refills | Status: DC

## 2020-03-18 MED ORDER — PANTOPRAZOLE SODIUM 40 MG PO TBEC: 40.0000 mg | DELAYED_RELEASE_TABLET | Freq: Every day | ORAL | 2 refills | Status: DC

## 2020-03-18 NOTE — Progress Notes (Signed)
Subjective:    Patient ID: Terri Harris, female    DOB: 05-31-1940, 79 y.o.   MRN: [id number]  HPI This patient was seen today for chronic pain  The medication list was reviewed and updated.   -Compliance with medication: takes 3 a day  - Number patient states they take daily: 3  -when was the last dose patient took? yesterday  The patient was advised the importance of maintaining medication and not using illegal substances with these.  Here for refills and follow up  The patient was educated that we can provide 3 monthly scripts for their medication, it is their responsibility to follow the instructions.  Side effects or complications from medications: none  Patient is aware that pain medications are meant to minimize the severity of the pain to allow their pain levels to improve to allow for better function. They are aware of that pain medications cannot totally remove their pain.  Due for UDT ( at least once per year) : last one 09/09/19  Scale of 1 to 10 ( 1 is least 10 is most) Your pain level without the medicine: 6 or 7 Your pain level with medication 4  Scale 1 to 10 ( 1-helps very little, 10 helps very well) How well does your pain medication reduce your pain so you can function better through out the day? 4  Needs refill on protonix.    Bilateral cold feet - Plan: US ARTERIAL ABI (SCREENING LOWER EXTREMITY)  Injury of finger of right hand, initial encounter - Plan: Ambulatory referral to Hand Surgery  Need for vaccination - Plan: Flu Vaccine QUAD High Dose(Fluad)  Encounter for chronic pain management  Patient had injury of her finger.  Is caused some tissue to grow up over the fingernail.  Very painful will not heal happened about a week and a half ago  Patient also relates cold feet that is present throughout the day does not seem to get better with socks even at nighttime it bothers her.  Has diminished pulses in the feet  History of smoking in the  past.   Review of Systems  Constitutional: Negative for activity change and appetite change.  HENT: Negative for congestion and rhinorrhea.   Respiratory: Negative for cough and shortness of breath.   Cardiovascular: Negative for chest pain and leg swelling.  Gastrointestinal: Negative for abdominal pain, nausea and vomiting.  Musculoskeletal: Positive for back pain. Negative for gait problem.  Skin: Negative for color change.  Neurological: Negative for dizziness and weakness.  Psychiatric/Behavioral: Negative for agitation and confusion.       Objective:   Physical Exam Vitals reviewed.  Constitutional:      General: She is not in acute distress. HENT:     Head: Normocephalic.  Cardiovascular:     Rate and Rhythm: Normal rate and regular rhythm.     Heart sounds: Normal heart sounds. No murmur heard.   Pulmonary:     Effort: Pulmonary effort is normal.     Breath sounds: Normal breath sounds.  Lymphadenopathy:     Cervical: No cervical adenopathy.  Neurological:     Mental Status: She is alert.  Psychiatric:        Behavior: Behavior normal.           Assessment & Plan:  1. Bilateral cold feet It is concerning that the patient may have diminished blood flow to repeat pulses are hard to feel in the feet therefore we will do ABI screening  bilateral - US ARTERIAL ABI (SCREENING LOWER EXTREMITY)  2. Injury of finger of right hand, initial encounter Patient has injury to the hand which has a lot of scar tissue needs to see hand surgery to have this cut back - Ambulatory referral to Hand Surgery  3. Need for vaccination Flu shot today - Flu Vaccine QUAD High Dose(Fluad)   The patient was seen in followup for chronic pain. A review over at their current pain status was discussed. Drug registry was checked. Prescriptions were given.  Regular follow-up recommended. Discussion was held regarding the importance of compliance with medication as well as pain  medication contract.  Patient was informed that medication may cause drowsiness and should not be combined  with other medications/alcohol or street drugs. If the patient feels medication is causing altered alertness then do not drive or operate dangerous equipment.

## 2020-03-18 NOTE — Addendum Note (Signed)
Addended by: Sallee Lange A on: 03/18/2020 06:38 PM   Modules accepted: Orders

## 2020-03-23 ENCOUNTER — Other Ambulatory Visit: Payer: Self-pay

## 2020-03-23 ENCOUNTER — Ambulatory Visit (HOSPITAL_COMMUNITY)
Admission: RE | Admit: 2020-03-23 | Discharge: 2020-03-23 | Disposition: A | Discharge status: Home / Self Care | Payer: PPO | Source: Ambulatory Visit | Attending: Family Medicine | Admitting: Family Medicine

## 2020-03-23 DIAGNOSIS — R209 Unspecified disturbances of skin sensation: Secondary | ICD-10-CM | POA: Diagnosis not present

## 2020-03-23 DIAGNOSIS — R0989 Other specified symptoms and signs involving the circulatory and respiratory systems: Secondary | ICD-10-CM | POA: Diagnosis not present

## 2020-03-24 ENCOUNTER — Other Ambulatory Visit: Payer: Self-pay

## 2020-03-24 ENCOUNTER — Ambulatory Visit
Admission: EM | Admit: 2020-03-24 | Discharge: 2020-03-24 | Disposition: A | Discharge status: Home / Self Care | Payer: PPO | Attending: Emergency Medicine | Admitting: Emergency Medicine

## 2020-03-24 DIAGNOSIS — R220 Localized swelling, mass and lump, head: Secondary | ICD-10-CM

## 2020-03-24 MED ORDER — AMOXICILLIN-POT CLAVULANATE 875-125 MG PO TABS: 1.0000 | ORAL_TABLET | Freq: Two times a day (BID) | ORAL | 0 refills | Status: DC

## 2020-03-24 MED ORDER — PREDNISONE 10 MG (21) PO TBPK: ORAL_TABLET | ORAL | 0 refills | Status: DC

## 2020-03-24 MED ORDER — DEXAMETHASONE SODIUM PHOSPHATE 10 MG/ML IJ SOLN
10.0000 mg | Freq: Once | INTRAMUSCULAR | Status: AC
  Administered 2020-03-24: 10 mg via INTRAMUSCULAR

## 2020-03-24 NOTE — ED Triage Notes (Signed)
Pt presents with tongue swelling after biting it 2 hours ago , pt is tachypnea but satting 95%

## 2020-03-24 NOTE — ED Provider Notes (Signed)
[place]   [id number] 03/24/20 Arrival Time: 4270   Chief Complaint  Patient presents with  . Oral Swelling     SUBJECTIVE: History from: patient, Family.  Terri Harris is a 79 y.o. female who who presented to the urgent care with a complaint of tongue swelling for the past 2 hours.  Reports she been herself today.   Has not tried any OTC medication.  Symptoms are made worse with talking.  Denies previous symptoms in the past.   Denies fever, chills, fatigue, sinus pain, rhinorrhea, sore throat,  wheezing, chest pain, nausea, changes in bowel or bladder habits.     ROS: As per HPI.  All other pertinent ROS negative.     Past Medical History:  Diagnosis Date  . ANA positive 01/2001  . Arthritis   . Arthritis 07/02/2013   ANA positive  . Cancer [facility])    partial hysterectomy age 15-uterine cancer  . GERD (gastroesophageal reflux disease)   . Hx of tobacco use, presenting hazards to health 07/02/2013  . Hypercholesteremia   . Hypertension   . Ileus, postoperative ([place]) 07/02/2013  . Osteoporosis   . Pneumonia    hx of  . SBO (small bowel obstruction) ([place]) 06/20/2103   Past Surgical History:  Procedure Laterality Date  . ABDOMINAL HYSTERECTOMY    . ANTERIOR LAT LUMBAR FUSION N/A 06/08/2014   Procedure: LUMBAR 2-3 LUMBAR 3-4 LUMBAR 4-5 ANTEROLATERAL DECOMPRESSION/FUSION WITH PERCUTANEOUS PEDICLE SCREWS;  Surgeon: Kristeen Miss, MD;  Location: [place] [facility];  Service: Neurosurgery;  Laterality: N/A;  L2-3 L3-4 L4-5 ANTEROLATERAL DECOMPRESSION/FUSION WITH PERCUTANEOUS PEDICLE SCREWS  . ANTERIOR LAT LUMBAR FUSION Left 01/07/2019   Procedure: Lumbar one-two Anterolateral lumbar interbody fusion with lateral plate fixation;  Surgeon: Kristeen Miss, MD;  Location: [place];  Service: Neurosurgery;  Laterality: Left;  . BACK SURGERY    . BOWEL RESECTION N/A 06/20/2013   Procedure: SMALL BOWEL RESECTION;  Surgeon: Harl Bowie, MD;  Location: [place];  Service: General;   Laterality: N/A;  . COLON SURGERY    . COLONOSCOPY  10/25/2011   Procedure: COLONOSCOPY;  Surgeon: Rogene Houston, MD;  [facility];  Service: Endoscopy;  Laterality: N/A;  930  . COLONOSCOPY N/A 05/09/2017   Procedure: COLONOSCOPY;  Surgeon: Rogene Houston, MD;  [facility];  Service: Endoscopy;  Laterality: N/A;  1030  . ESOPHAGEAL DILATION N/A 05/25/2015   Procedure: ESOPHAGEAL DILATION;  Surgeon: Rogene Houston, MD;  [facility];  Service: Endoscopy;  Laterality: N/A;  . ESOPHAGOGASTRODUODENOSCOPY  02/2009  . ESOPHAGOGASTRODUODENOSCOPY N/A 05/25/2015   Procedure: ESOPHAGOGASTRODUODENOSCOPY (EGD);  Surgeon: Rogene Houston, MD;  [facility];  Service: Endoscopy;  Laterality: N/A;  . HERNIA REPAIR Left    groin  . INGUINAL HERNIA REPAIR Left 09/11/2013   Procedure: HERNIA REPAIR INGUINAL INCARCERATED;  Surgeon: Harl Bowie, MD;  Location: [place];  Service: General;  Laterality: Left;  . LAPAROTOMY N/A 06/20/2013   Procedure: EXPLORATORY LAPAROTOMY;  Surgeon: Harl Bowie, MD;  Location: [place];  Service: General;  Laterality: N/A;  . LUMBAR PERCUTANEOUS PEDICLE SCREW 3 LEVEL N/A 06/08/2014   Procedure: LUMBAR PERCUTANEOUS PEDICLE SCREW 3 LEVEL;  Surgeon: Kristeen Miss, MD;  Location: [place] [facility];  Service: Neurosurgery;  Laterality: N/A;  . POLYPECTOMY  05/09/2017   Procedure: POLYPECTOMY;  Surgeon: Rogene Houston, MD;  [facility];  Service: Endoscopy;;  sigmoid colon  . TONSILLECTOMY  Allergies  Allergen Reactions  . Codeine Nausea And Vomiting  . Fosamax [Alendronate Sodium] Other (See Comments)    gastritis   No current facility-administered medications on file prior to encounter.   Current Outpatient Medications on File Prior to Encounter  Medication Sig Dispense Refill  . acetaminophen (TYLENOL) 500 MG tablet Take 1,000 mg by mouth every 6 (six) hours as needed for mild pain or headache.     Marland Kitchen alum &  mag hydroxide-simeth (MAALOX/MYLANTA) 200-200-20 MG/5ML suspension Take 15 mLs by mouth every 6 (six) hours as needed for indigestion or heartburn.    Marland Kitchen amLODipine (NORVASC) 5 MG tablet TAKE 1 TABLET BY MOUTH EVERY DAY 90 tablet 1  . Calcium Carbonate-Vitamin D (CALCIUM-D) 600-400 MG-UNIT TABS Take 2 tablets by mouth daily.    Marland Kitchen oxyCODONE-acetaminophen (PERCOCET/ROXICET) 5-325 MG tablet 1 tid prn 90 tablet 0  . oxyCODONE-acetaminophen (PERCOCET/ROXICET) 5-325 MG tablet 1 tid prn 90 tablet 0  . oxyCODONE-acetaminophen (PERCOCET/ROXICET) 5-325 MG tablet One tid prn 90 tablet 0  . pantoprazole (PROTONIX) 40 MG tablet Take 1 tablet (40 mg total) by mouth daily. 90 tablet 2  . zoledronic acid (RECLAST) 5 MG/100ML SOLN injection Inject 5 mg into the vein once.     Social History   Socioeconomic History  . Marital status: Married    Spouse name: Not on file  . Number of children: Not on file  . Years of education: Not on file  . Highest education level: Not on file  Occupational History  . Not on file  Tobacco Use  . Smoking status: Former Smoker    Packs/day: 0.25    Years: 0.00    Pack years: 0.00    Types: Cigarettes    Quit date: 10/18/2011    Years since quitting: 8.4  . Smokeless tobacco: Never Used  Vaping Use  . Vaping Use: Never used  Substance and Sexual Activity  . Alcohol use: No  . Drug use: No  . Sexual activity: Not on file  Other Topics Concern  . Not on file  Social History Narrative  . Not on file   Social Determinants of Health   Financial Resource Strain:   . Difficulty of Paying Living Expenses: Not on file  Food Insecurity:   . Worried About Charity fundraiser in the Last Year: Not on file  . Ran Out of Food in the Last Year: Not on file  Transportation Needs:   . Lack of Transportation (Medical): Not on file  . Lack of Transportation (Non-Medical): Not on file  Physical Activity:   . Days of Exercise per Week: Not on file  . Minutes of Exercise per  Session: Not on file  Stress:   . Feeling of Stress : Not on file  Social Connections:   . Frequency of Communication with Friends and Family: Not on file  . Frequency of Social Gatherings with Friends and Family: Not on file  . Attends Religious Services: Not on file  . Active Member of Clubs or Organizations: Not on file  . Attends Archivist Meetings: Not on file  . Marital Status: Not on file  Intimate Partner Violence:   . Fear of Current or Ex-Partner: Not on file  . Emotionally Abused: Not on file  . Physically Abused: Not on file  . Sexually Abused: Not on file   Family History  Problem Relation Age of Onset  . Hypertension Mother   . Heart disease Father   .  Diabetes Sister   . Heart disease Sister   . Diabetes Brother   . Heart disease Brother   . Anesthesia problems Neg Hx   . Hypotension Neg Hx   . Malignant hyperthermia Neg Hx   . Pseudochol deficiency Neg Hx     OBJECTIVE:  Vitals:   03/24/20 1603  BP: (!) 148/81  Pulse: 68  Resp: (!) 26  Temp: 98.2 F (36.8 C)  SpO2: 97%     Physical Exam Vitals and nursing note reviewed.  Constitutional:      General: She is not in acute distress.    Appearance: Normal appearance. She is normal weight. She is not ill-appearing, toxic-appearing or diaphoretic.  HENT:     Head: Normocephalic.     Mouth/Throat:     Lips: Pink.     Mouth: Mucous membranes are moist.     Tongue: No lesions. Tongue does not deviate from midline.     Comments: Swelling tongue Cardiovascular:     Rate and Rhythm: Normal rate and regular rhythm.     Pulses: Normal pulses.     Heart sounds: Normal heart sounds. No murmur heard.  No friction rub. No gallop.   Pulmonary:     Effort: Tachypnea present. No respiratory distress.     Breath sounds: Normal breath sounds. No stridor. No wheezing, rhonchi or rales.  Chest:     Chest wall: No tenderness.  Neurological:     Mental Status: She is alert and oriented to person,  place, and time.     LABS:  No results found for this or any previous visit (from the past 24 hour(s)).   ASSESSMENT & PLAN:  1. Tongue swelling     Meds ordered this encounter  Medications  . predniSONE (STERAPRED UNI-PAK 21 TAB) 10 MG (21) TBPK tablet    Sig: Take 6 tabs by mouth daily  for 1 days, then 5 tabs for 1 days, then 4 tabs for 1 days, then 3 tabs for 1 days, 2 tabs for 1 days, then 1 tab by mouth daily for 1 days    Dispense:  21 tablet    Refill:  0  . amoxicillin-clavulanate (AUGMENTIN) 875-125 MG tablet    Sig: Take 1 tablet by mouth every 12 (twelve) hours.    Dispense:  14 tablet    Refill:  0    Discharge instructions   Get plenty of rest and push fluids Prednisone was prescribed take as directed Augmentin was prescribed/take as directed Tessalon Perles prescribed for cough Use OTC medications like ibuprofen or tylenol as needed fever or pain Call or go to the ED if you have any new or worsening symptoms such as shortness of breath, difficulty breathing, increased respiratory rhythm, use of accessory muscle, chest tightness, chest pain, turning blue, changes in mental status, etc...   Reviewed expectations re: course of current medical issues. Questions answered. Outlined signs and symptoms indicating need for more acute intervention. Patient verbalized understanding. After Visit Summary given.         Emerson Monte, [place] 03/24/20 1616

## 2020-03-24 NOTE — Discharge Instructions (Addendum)
Get plenty of rest and push fluids Prednisone was prescribed take as directed Augmentin was prescribed/take as directed Tessalon Perles prescribed for cough Use OTC medications like ibuprofen or tylenol as needed fever or pain Call or go to the ED if you have any new or worsening symptoms such as shortness of breath, difficulty breathing, increased respiratory rhythm, use of accessory muscle, chest tightness, chest pain, turning blue, changes in mental status, etc..Marland Kitchen

## 2020-03-30 ENCOUNTER — Other Ambulatory Visit: Payer: Self-pay | Admitting: *Deleted

## 2020-03-30 DIAGNOSIS — R0989 Other specified symptoms and signs involving the circulatory and respiratory systems: Secondary | ICD-10-CM

## 2020-03-30 DIAGNOSIS — M79644 Pain in right finger(s): Secondary | ICD-10-CM | POA: Diagnosis not present

## 2020-03-31 ENCOUNTER — Other Ambulatory Visit: Payer: Self-pay | Admitting: Family Medicine

## 2020-04-01 ENCOUNTER — Other Ambulatory Visit: Payer: Self-pay

## 2020-04-01 DIAGNOSIS — L859 Epidermal thickening, unspecified: Secondary | ICD-10-CM | POA: Diagnosis not present

## 2020-04-01 DIAGNOSIS — D492 Neoplasm of unspecified behavior of bone, soft tissue, and skin: Secondary | ICD-10-CM | POA: Diagnosis not present

## 2020-04-01 DIAGNOSIS — L570 Actinic keratosis: Secondary | ICD-10-CM | POA: Diagnosis not present

## 2020-04-03 ENCOUNTER — Other Ambulatory Visit: Payer: Self-pay | Admitting: Family Medicine

## 2020-04-05 ENCOUNTER — Telehealth: Payer: Self-pay

## 2020-04-05 NOTE — Telephone Encounter (Signed)
Patient notified

## 2020-04-05 NOTE — Telephone Encounter (Signed)
Patient is requesting refill on amlodipine 5mg  called into [facility]

## 2020-04-05 NOTE — Telephone Encounter (Signed)
Med sent in today. Will call pt after dr scott answers message on pt's husband as well.

## 2020-05-19 ENCOUNTER — Encounter (HOSPITAL_COMMUNITY)
Admission: RE | Admit: 2020-05-19 | Discharge: 2020-05-19 | Disposition: A | Discharge status: Home / Self Care | Payer: PPO | Source: Ambulatory Visit | Attending: Family Medicine | Admitting: Family Medicine

## 2020-05-19 ENCOUNTER — Other Ambulatory Visit: Payer: Self-pay

## 2020-05-19 ENCOUNTER — Encounter (HOSPITAL_COMMUNITY): Payer: Self-pay

## 2020-05-19 DIAGNOSIS — M810 Age-related osteoporosis without current pathological fracture: Secondary | ICD-10-CM | POA: Insufficient documentation

## 2020-05-19 MED ORDER — ZOLEDRONIC ACID 5 MG/100ML IV SOLN
5.0000 mg | Freq: Once | INTRAVENOUS | Status: AC
  Administered 2020-05-19: 13:00:00 5 mg via INTRAVENOUS

## 2020-05-19 MED ORDER — SODIUM CHLORIDE 0.9 % IV SOLN: Freq: Once | INTRAVENOUS | Status: AC

## 2020-05-25 LAB — POCT I-STAT, CHEM 8
BUN: 23 mg/dL (ref 8–23)
Calcium, Ion: 1.2 mmol/L (ref 1.15–1.40)
Chloride: 102 mmol/L (ref 98–111)
Creatinine, Ser: 0.7 mg/dL (ref 0.44–1.00)
Glucose, Bld: 91 mg/dL (ref 70–99)
HCT: 45 % (ref 36.0–46.0)
Hemoglobin: 15.3 g/dL — ABNORMAL HIGH (ref 12.0–15.0)
Potassium: 3.9 mmol/L (ref 3.5–5.1)
Sodium: 140 mmol/L (ref 135–145)
TCO2: 26 mmol/L (ref 22–32)

## 2020-06-18 ENCOUNTER — Ambulatory Visit: Payer: PPO | Admitting: Family Medicine

## 2020-06-21 ENCOUNTER — Other Ambulatory Visit: Payer: Self-pay

## 2020-06-21 ENCOUNTER — Encounter: Payer: Self-pay | Admitting: Family Medicine

## 2020-06-21 ENCOUNTER — Ambulatory Visit (INDEPENDENT_AMBULATORY_CARE_PROVIDER_SITE_OTHER): Payer: PPO | Admitting: Family Medicine

## 2020-06-21 VITALS — BP 140/82 | HR 78 | Temp 97.1°F | Ht 65.0 in | Wt 125.8 lb

## 2020-06-21 DIAGNOSIS — G8929 Other chronic pain: Secondary | ICD-10-CM | POA: Diagnosis not present

## 2020-06-21 MED ORDER — OXYCODONE-ACETAMINOPHEN 5-325 MG PO TABS: ORAL_TABLET | ORAL | 0 refills | Status: DC

## 2020-06-21 NOTE — Patient Instructions (Signed)
Generic loratadine 10 mg take one every day to prevent swelling in the tongue

## 2020-06-21 NOTE — Progress Notes (Signed)
   Subjective:    Patient ID: Terri Harris, female    DOB: Feb 22, 1941, 80 y.o.   MRN: [id number]  HPI This patient was seen today for chronic pain  The medication list was reviewed and updated.   -Compliance with medication: yes  - Number patient states they take daily: 3  -when was the last dose patient took? today  The patient was advised the importance of maintaining medication and not using illegal substances with these.  Here for refills and follow up  The patient was educated that we can provide 3 monthly scripts for their medication, it is their responsibility to follow the instructions.  Side effects or complications from medications: none  Patient is aware that pain medications are meant to minimize the severity of the pain to allow their pain levels to improve to allow for better function. They are aware of that pain medications cannot totally remove their pain.  Due for UDT ( at least once per year) : 09/09/19    Patient overall states she feels pretty good most days denies any major setbacks no recent falls.  Energy level doing all right.  Moods are doing good.  Complains of a lot of pain in her back hips and knees she feels like the pain medicine not quite doing enough she would like to increase it to 4 tablets/day denies abusing it    Review of Systems  Constitutional: Negative for activity change and appetite change.  HENT: Negative for congestion and rhinorrhea.   Respiratory: Negative for cough and shortness of breath.   Cardiovascular: Negative for chest pain and leg swelling.  Gastrointestinal: Negative for abdominal pain, nausea and vomiting.  Skin: Negative for color change.  Neurological: Negative for dizziness and weakness.  Psychiatric/Behavioral: Negative for agitation and confusion.       Objective:   Physical Exam Vitals reviewed.  Constitutional:      General: She is not in acute distress.    Appearance: She is well-nourished.  HENT:      Head: Normocephalic.  Cardiovascular:     Rate and Rhythm: Normal rate and regular rhythm.     Heart sounds: Normal heart sounds. No murmur heard.   Pulmonary:     Effort: Pulmonary effort is normal.     Breath sounds: Normal breath sounds.  Musculoskeletal:        General: No edema.  Lymphadenopathy:     Cervical: No cervical adenopathy.  Neurological:     Mental Status: She is alert.  Psychiatric:        Behavior: Behavior normal.           Assessment & Plan:  The patient was seen in followup for chronic pain. A review over at their current pain status was discussed. Drug registry was checked. Prescriptions were given.  Regular follow-up recommended. Discussion was held regarding the importance of compliance with medication as well as pain medication contract.  Patient was informed that medication may cause drowsiness and should not be combined  with other medications/alcohol or street drugs. If the patient feels medication is causing altered alertness then do not drive or operate dangerous equipment.  We will go up to 4/day we will send in prescriptions once in the electronic scriber is back online  Patient will follow-up in 3 months sooner if any problems

## 2020-08-25 ENCOUNTER — Encounter: Payer: Self-pay | Admitting: Family Medicine

## 2020-08-25 ENCOUNTER — Ambulatory Visit (INDEPENDENT_AMBULATORY_CARE_PROVIDER_SITE_OTHER): Payer: PPO | Admitting: Family Medicine

## 2020-08-25 ENCOUNTER — Other Ambulatory Visit: Payer: Self-pay

## 2020-08-25 VITALS — BP 134/70 | HR 82 | Temp 97.2°F | Wt 127.8 lb

## 2020-08-25 DIAGNOSIS — E538 Deficiency of other specified B group vitamins: Secondary | ICD-10-CM | POA: Diagnosis not present

## 2020-08-25 DIAGNOSIS — I10 Essential (primary) hypertension: Secondary | ICD-10-CM | POA: Diagnosis not present

## 2020-08-25 DIAGNOSIS — E785 Hyperlipidemia, unspecified: Secondary | ICD-10-CM | POA: Diagnosis not present

## 2020-08-25 DIAGNOSIS — Z79891 Long term (current) use of opiate analgesic: Secondary | ICD-10-CM

## 2020-08-25 DIAGNOSIS — M25552 Pain in left hip: Secondary | ICD-10-CM

## 2020-08-25 MED ORDER — OXYCODONE-ACETAMINOPHEN 5-325 MG PO TABS: ORAL_TABLET | ORAL | 0 refills | Status: DC

## 2020-08-25 MED ORDER — OXYCODONE-ACETAMINOPHEN 5-325 MG PO TABS
ORAL_TABLET | ORAL | 0 refills | Status: DC
Start: 2020-08-25 — End: 2020-12-06

## 2020-08-25 MED ORDER — DICLOFENAC SODIUM 50 MG PO TBEC: 50.0000 mg | DELAYED_RELEASE_TABLET | Freq: Two times a day (BID) | ORAL | 0 refills | Status: DC

## 2020-08-25 NOTE — Progress Notes (Signed)
Subjective:    Patient ID: Terri Harris, female    DOB: 16-Jul-1940, 80 y.o.   MRN: [id number]  HPI Pt having left hip pain. Has become worse over the past few weeks. Unable to sleep on left side. Pt is on Percocet 5-325 mg. Pt has pain when getting into vehicle when stepping up with left leg.  This patient was seen today for chronic pain  The medication list was reviewed and updated.  Location of Pain for which the patient has been treated with regarding narcotics: Patient has pain in her lumbar spine that she is on medication for  Onset of this pain: Has had for several years   -Compliance with medication: Takes 3 or 4 daily is compliant  - Number patient states they take daily: 3-4 daily  -when was the last dose patient took?  Earlier today  The patient was advised the importance of maintaining medication and not using illegal substances with these.  Here for refills and follow up  The patient was educated that we can provide 3 monthly scripts for their medication, it is their responsibility to follow the instructions.  Side effects or complications from medications: Denies side effects with medicine  Patient is aware that pain medications are meant to minimize the severity of the pain to allow their pain levels to improve to allow for better function. They are aware of that pain medications cannot totally remove their pain.  Due for UDT ( at least once per year) : Does on a yearly basis she will be due on her next visit  Scale of 1 to 10 ( 1 is least 10 is most) Your pain level without the medicine: 8 Your pain level with medication 4  Scale 1 to 10 ( 1-helps very little, 10 helps very well) How well does your pain medication reduce your pain so you can function better through out the day?  8  Quality of the pain: Ache throb burning down the left leg  Persistence of the pain: Daily sometimes in the day worse than others  Modifying factors: Pain medication, laying  down, worse with activity  Left hip pain - Plan: DG HIP UNILAT WITH PELVIS 2-3 VIEWS LEFT  Vitamin B 12 deficiency - Plan: Vitamin B12  Hyperlipidemia, unspecified hyperlipidemia type - Plan: Lipid panel  Primary hypertension - Plan: Basic metabolic panel  Vitamin J67 deficiency  Encounter for long-term opiate analgesic use  Does take her blood pressure periodically overall its been under good control Does have the vitamin D deficiency increased pain and discomfort with some numbness into the leg Significant left hip pain with discomfort with certain movements.    Review of Systems See above    Objective:   Physical Exam  Lungs clear heart regular low back subjective discomfort negative straight leg raise left hip some increased anterior pain with internal and external rotation but no trochanteric discomfort with palpation      Assessment & Plan:  1. Left hip pain X-rays recommended.  May use anti-inflammatory over the course of the next few weeks as long as it does not cause stomach pain if ongoing troubles may end up needing to be referred to orthopedics - DG HIP UNILAT WITH PELVIS 2-3 VIEWS LEFT  2. Vitamin B 12 deficiency Check B12 level to see if other intervention necessary - Vitamin B12  3. Hyperlipidemia, unspecified hyperlipidemia type Continue to watch diet check labs - Lipid panel  4. Primary hypertension Blood pressure checked very  good control continue current meds - Basic metabolic panel  5. Vitamin B12 deficiency Check B12  6. Encounter for long-term opiate analgesic use The patient was seen in followup for chronic pain. A review over at their current pain status was discussed. Drug registry was checked. Prescriptions were given.  Regular follow-up recommended. Discussion was held regarding the importance of compliance with medication as well as pain medication contract.  Patient was informed that medication may cause drowsiness and should not be  combined  with other medications/alcohol or street drugs. If the patient feels medication is causing altered alertness then do not drive or operate dangerous equipment.  Drug registry checked prescriptions written should last her into 3 months from now with a follow-up visit by late June or early July

## 2020-08-27 ENCOUNTER — Other Ambulatory Visit: Payer: Self-pay

## 2020-08-27 ENCOUNTER — Ambulatory Visit (HOSPITAL_COMMUNITY)
Admission: RE | Admit: 2020-08-27 | Discharge: 2020-08-27 | Disposition: A | Discharge status: Home / Self Care | Payer: PPO | Source: Ambulatory Visit | Attending: Family Medicine | Admitting: Family Medicine

## 2020-08-27 DIAGNOSIS — M25552 Pain in left hip: Secondary | ICD-10-CM | POA: Insufficient documentation

## 2020-08-30 DIAGNOSIS — E538 Deficiency of other specified B group vitamins: Secondary | ICD-10-CM | POA: Diagnosis not present

## 2020-08-30 DIAGNOSIS — I10 Essential (primary) hypertension: Secondary | ICD-10-CM | POA: Diagnosis not present

## 2020-08-30 DIAGNOSIS — E785 Hyperlipidemia, unspecified: Secondary | ICD-10-CM | POA: Diagnosis not present

## 2020-08-31 ENCOUNTER — Encounter: Payer: Self-pay | Admitting: Family Medicine

## 2020-08-31 LAB — LIPID PANEL
Chol/HDL Ratio: 3.2 ratio (ref 0.0–4.4)
Cholesterol, Total: 239 mg/dL — ABNORMAL HIGH (ref 100–199)
HDL: 74 mg/dL (ref >39)
LDL Chol Calc (NIH): 144 mg/dL — ABNORMAL HIGH (ref 0–99)
Triglycerides: 121 mg/dL (ref 0–149)
VLDL Cholesterol Cal: 21 mg/dL (ref 5–40)

## 2020-08-31 LAB — BASIC METABOLIC PANEL
BUN/Creatinine Ratio: 14 (ref 12–28)
BUN: 9 mg/dL (ref 8–27)
CO2: 25 mmol/L (ref 20–29)
Calcium: 9.6 mg/dL (ref 8.7–10.3)
Chloride: 99 mmol/L (ref 96–106)
Creatinine, Ser: 0.66 mg/dL (ref 0.57–1.00)
Glucose: 97 mg/dL (ref 65–99)
Potassium: 4.4 mmol/L (ref 3.5–5.2)
Sodium: 140 mmol/L (ref 134–144)
eGFR: 90 mL/min/{1.73_m2} (ref >59)

## 2020-08-31 LAB — VITAMIN B12: Vitamin B-12: 814 pg/mL (ref 232–1245)

## 2020-09-13 ENCOUNTER — Other Ambulatory Visit: Payer: Self-pay | Admitting: Family Medicine

## 2020-09-20 ENCOUNTER — Ambulatory Visit: Payer: PPO | Admitting: Family Medicine

## 2020-09-21 ENCOUNTER — Other Ambulatory Visit: Payer: Self-pay

## 2020-09-21 ENCOUNTER — Encounter: Payer: Self-pay | Admitting: Family Medicine

## 2020-09-21 ENCOUNTER — Ambulatory Visit (INDEPENDENT_AMBULATORY_CARE_PROVIDER_SITE_OTHER): Payer: PPO | Admitting: Family Medicine

## 2020-09-21 VITALS — BP 137/74 | Temp 96.4°F | Wt 122.0 lb

## 2020-09-21 DIAGNOSIS — K921 Melena: Secondary | ICD-10-CM

## 2020-09-21 LAB — POCT HEMOGLOBIN: Hemoglobin: 14.6 g/dL (ref 11–14.6)

## 2020-09-21 MED ORDER — PANTOPRAZOLE SODIUM 40 MG PO TBEC
40.0000 mg | DELAYED_RELEASE_TABLET | Freq: Every day | ORAL | 0 refills | Status: DC
Start: 2020-09-21 — End: 2020-12-06

## 2020-09-21 NOTE — Progress Notes (Signed)
   Subjective:    Patient ID: Terri Harris, female    DOB: [birth year], 80 y.o.   MRN: [id number]  HPI Pt woke up Saturday morning had black tarry stool. Happened Sunday and Monday morning. This morning was normal brown color. No n/v/d. Stomach ache. No blood.  Denies any chest tightness pressure pain denies abdominal discomfort currently  Review of Systems Relates black tarry stools over the weekend now it is cleared up she stopped taking the diclofenac    Objective:   Physical Exam Lungs clear heart regular pulse normal abdomen soft no guarding rebound rectal exam negative for blood  We talked at length about how this could be a small ulcer we will wait to see if her stool test shows any blood if it does referral for EGD if it does not show any blood she will take a PPI for 8 weeks stay away from NSAIDs and follow-up in several weeks at her next checkup warning signs were discussed     Assessment & Plan:  1. Tarry stool Stool test today was negative for blood they will do a home test for blood.  Hemoglobin is good.  PPI for the next 8 weeks.  Recheck again in July.  Follow-up sooner problems.  Stop diclofenac.  No need for EGD currently. - POCT hemoglobin - IFOBT POC (occult bld, rslt in office); Future

## 2020-09-24 ENCOUNTER — Other Ambulatory Visit: Payer: Self-pay

## 2020-09-24 DIAGNOSIS — K921 Melena: Secondary | ICD-10-CM

## 2020-09-24 LAB — IFOBT (OCCULT BLOOD): IFOBT: NEGATIVE

## 2020-10-20 ENCOUNTER — Other Ambulatory Visit: Payer: Self-pay | Admitting: Family Medicine

## 2020-11-22 NOTE — Progress Notes (Addendum)
Subjective:   Terri Harris is a 80 y.o. female who presents for an Initial Medicare Annual Wellness Visit.  I connected with Lemar Lofty today by telephone and verified that I am speaking with the correct person using two identifiers. Location patient: home Location provider: work Persons participating in the virtual visit: patient, provider.   I discussed the limitations, risks, security and privacy concerns of performing an evaluation and management service by telephone and the availability of in person appointments. I also discussed with the patient that there may be a patient responsible charge related to this service. The patient expressed understanding and verbally consented to this telephonic visit.    Interactive audio and video telecommunications were attempted between this provider and patient, however failed, due to patient having technical difficulties OR patient did not have access to video capability.  We continued and completed visit with audio only.     Review of Systems    N/A  Cardiac Risk Factors include: advanced age (>55men, >54 women);hypertension;dyslipidemia     Objective:    Today's Vitals   11/23/20 1012  PainSc: 6    There is no height or weight on file to calculate BMI.  Advanced Directives 11/23/2020 01/03/2019 09/18/2017 05/09/2017 05/25/2015 12/28/2014 06/08/2014  Does Patient Have a Medical Advance Directive? No No No No No No No  Would patient like information on creating a medical advance directive? No - Patient declined No - Patient declined No - Patient declined No - Patient declined No - patient declined information - Yes - Educational materials given  Pre-existing out of facility DNR order (yellow form or pink MOST form) - - - - - - -    Current Medications (verified) Outpatient Encounter Medications as of 11/23/2020  Medication Sig   acetaminophen (TYLENOL) 500 MG tablet Take 1,000 mg by mouth every 6 (six) hours as needed for mild pain or  headache.    alum & mag hydroxide-simeth (MAALOX/MYLANTA) 200-200-20 MG/5ML suspension Take 15 mLs by mouth every 6 (six) hours as needed for indigestion or heartburn.   amLODipine (NORVASC) 5 MG tablet TAKE 1 TABLET BY MOUTH EVERY DAY   oxyCODONE-acetaminophen (PERCOCET/ROXICET) 5-325 MG tablet 1 qid prn   oxyCODONE-acetaminophen (PERCOCET/ROXICET) 5-325 MG tablet 1 qid prn   oxyCODONE-acetaminophen (PERCOCET/ROXICET) 5-325 MG tablet One qid prn   pantoprazole (PROTONIX) 40 MG tablet Take 1 tablet (40 mg total) by mouth daily.   zoledronic acid (RECLAST) 5 MG/100ML SOLN injection Inject 5 mg into the vein once.   Calcium Carbonate-Vitamin D (CALCIUM-D) 600-400 MG-UNIT TABS Take 2 tablets by mouth daily. (Patient not taking: Reported on 11/23/2020)   No facility-administered encounter medications on file as of 11/23/2020.    Allergies (verified) Codeine and Fosamax [alendronate sodium]   History: Past Medical History:  Diagnosis Date   ANA positive 01/2001   Arthritis    Arthritis 07/02/2013   ANA positive   Cancer ([place])    partial hysterectomy age 35-uterine cancer   GERD (gastroesophageal reflux disease)    Hx of tobacco use, presenting hazards to health 07/02/2013   Hypercholesteremia    Hypertension    Ileus, postoperative ([place]) 07/02/2013   Osteoporosis    Pneumonia    hx of   SBO (small bowel obstruction) ([place]) 06/20/2103   Past Surgical History:  Procedure Laterality Date   ABDOMINAL HYSTERECTOMY     ANTERIOR LAT LUMBAR FUSION N/A 06/08/2014   Procedure: LUMBAR 2-3 LUMBAR 3-4 LUMBAR 4-5 ANTEROLATERAL DECOMPRESSION/FUSION WITH PERCUTANEOUS PEDICLE SCREWS;  Surgeon: Kristeen Miss, MD;  Location: [facility] [facility];  Service: Neurosurgery;  Laterality: N/A;  L2-3 L3-4 L4-5 ANTEROLATERAL DECOMPRESSION/FUSION WITH PERCUTANEOUS PEDICLE SCREWS   ANTERIOR LAT LUMBAR FUSION Left 01/07/2019   Procedure: Lumbar one-two Anterolateral lumbar interbody fusion with lateral plate fixation;  Surgeon:  Kristeen Miss, MD;  Location: [place];  Service: Neurosurgery;  Laterality: Left;   BACK SURGERY     BOWEL RESECTION N/A 06/20/2013   Procedure: SMALL BOWEL RESECTION;  Surgeon: Harl Bowie, MD;  Location: [facility];  Service: General;  Laterality: N/A;   COLON SURGERY     COLONOSCOPY  10/25/2011   Procedure: COLONOSCOPY;  Surgeon: Rogene Houston, MD;  [facility];  Service: Endoscopy;  Laterality: N/A;  930   COLONOSCOPY N/A 05/09/2017   Procedure: COLONOSCOPY;  Surgeon: Rogene Houston, MD;  [facility];  Service: Endoscopy;  Laterality: N/A;  1030   ESOPHAGEAL DILATION N/A 05/25/2015   Procedure: ESOPHAGEAL DILATION;  Surgeon: Rogene Houston, MD;  [facility];  Service: Endoscopy;  Laterality: N/A;   ESOPHAGOGASTRODUODENOSCOPY  02/2009   ESOPHAGOGASTRODUODENOSCOPY N/A 05/25/2015   Procedure: ESOPHAGOGASTRODUODENOSCOPY (EGD);  Surgeon: Rogene Houston, MD;  [facility];  Service: Endoscopy;  Laterality: N/A;   HERNIA REPAIR Left    groin   INGUINAL HERNIA REPAIR Left 09/11/2013   Procedure: HERNIA REPAIR INGUINAL INCARCERATED;  Surgeon: Harl Bowie, MD;  Location: [place];  Service: General;  Laterality: Left;   LAPAROTOMY N/A 06/20/2013   Procedure: EXPLORATORY LAPAROTOMY;  Surgeon: Harl Bowie, MD;  Location: [place];  Service: General;  Laterality: N/A;   LUMBAR PERCUTANEOUS PEDICLE SCREW 3 LEVEL N/A 06/08/2014   Procedure: LUMBAR PERCUTANEOUS PEDICLE SCREW 3 LEVEL;  Surgeon: Kristeen Miss, MD;  Location: [facility];  Service: Neurosurgery;  Laterality: N/A;   POLYPECTOMY  05/09/2017   Procedure: POLYPECTOMY;  Surgeon: Rogene Houston, MD;  [facility];  Service: Endoscopy;;  sigmoid colon   TONSILLECTOMY     Family History  Problem Relation Age of Onset   Hypertension Mother    Heart disease Father    Diabetes Sister    Heart disease Sister    Diabetes Brother    Heart disease Brother    Anesthesia problems  Neg Hx    Hypotension Neg Hx    Malignant hyperthermia Neg Hx    Pseudochol deficiency Neg Hx    Social History   Socioeconomic History   Marital status: Married    Spouse name: Not on file   Number of children: Not on file   Years of education: Not on file   Highest education level: Not on file  Occupational History   Not on file  Tobacco Use   Smoking status: Former    Packs/day: 0.25    Years: 0.00    Pack years: 0.00    Types: Cigarettes    Quit date: 10/18/2011    Years since quitting: 9.1   Smokeless tobacco: Never  Vaping Use   Vaping Use: Never used  Substance and Sexual Activity   Alcohol use: No   Drug use: No   Sexual activity: Not on file  Other Topics Concern   Not on file  Social History Narrative   Not on file   Social Determinants of Health   Financial Resource Strain: Low Risk    Difficulty of Paying Living Expenses: Not hard at all  Food Insecurity: No Food Insecurity  Worried About Charity fundraiser in the Last Year: Never true   [place] in the Last Year: Never true  Transportation Needs: No Transportation Needs   Lack of Transportation (Medical): No   Lack of Transportation (Non-Medical): No  Physical Activity: Inactive   Days of Exercise per Week: 0 days   Minutes of Exercise per Session: 0 min  Stress: No Stress Concern Present   Feeling of Stress : Not at all  Social Connections: Moderately Isolated   Frequency of Communication with Friends and Family: Three times a week   Frequency of Social Gatherings with Friends and Family: More than three times a week   Attends Religious Services: Never   Marine scientist or Organizations: No   Attends Music therapist: Never   Marital Status: Married    Tobacco Counseling Counseling given: Not Answered   Clinical Intake:  Pre-visit preparation completed: Yes  Pain : 0-10 Pain Score: 6  Pain Type: Chronic pain Pain Location: Back Pain Orientation:  Lower Pain Onset: More than a month ago Pain Frequency: Intermittent     Nutritional Risks: Nausea/ vomitting/ diarrhea (Diarrhea) Diabetes: No CBG done?: No Did pt. bring in CBG monitor from home?: No  How often do you need to have someone help you when you read instructions, pamphlets, or other written materials from your doctor or pharmacy?: 1 - Never  Diabetic?No   Interpreter Needed?: No  Information entered by :: [place] of Daily Living In your present state of health, do you have any difficulty performing the following activities: 11/23/2020  Hearing? Y  Vision? N  Difficulty concentrating or making decisions? N  Walking or climbing stairs? N  Dressing or bathing? N  Doing errands, shopping? N  Preparing Food and eating ? N  Using the Toilet? N  In the past six months, have you accidently leaked urine? N  Do you have problems with loss of bowel control? N  Managing your Medications? N  Managing your Finances? N  Housekeeping or managing your Housekeeping? N  Some recent data might be hidden    Patient Care Team: Kathyrn Drown, MD as PCP - General (Family Medicine)  Indicate any recent Medical Services you may have received from other than [facility] providers in the past year (date may be approximate).     Assessment:   This is a routine wellness examination for [place].  Hearing/Vision screen Vision Screening - Comments:: Patient states has not had eye examined since cataract surgery 5 years ago.   Dietary issues and exercise activities discussed: Current Exercise Habits: The patient has a physically strenuous job, but has no regular exercise apart from work., Exercise limited by: None identified   Goals Addressed             This Visit's Progress    Patient Stated       I would like to live long enough to see my granddaughter get married and have children        Depression Screen PHQ 2/9 Scores 11/23/2020 06/21/2020 04/10/2018  07/10/2017 04/09/2017 10/19/2015 04/19/2015  PHQ - 2 Score 0 0 0 0 0 0 0    Fall Risk Fall Risk  11/23/2020 06/21/2020 09/09/2019 04/14/2019 04/10/2018  Falls in the past year? 0 0 0 0 0  Number falls in past yr: 0 - - - 0  Injury with Fall? 0 - - - 0  Risk for fall due to : No  Fall Risks - - - -  Follow up Falls evaluation completed;Falls prevention discussed Falls evaluation completed - Falls evaluation completed Education provided    FALL RISK PREVENTION PERTAINING TO THE HOME:  Any stairs in or around the home? Yes  If so, are there any without handrails? No  Home free of loose throw rugs in walkways, pet beds, electrical cords, etc? Yes  Adequate lighting in your home to reduce risk of falls? Yes   ASSISTIVE DEVICES UTILIZED TO PREVENT FALLS:  Life alert? No  Use of a cane, walker or w/c? No  Grab bars in the bathroom? Yes  Shower chair or bench in shower? No  Elevated toilet seat or a handicapped toilet? Yes    Cognitive Function:  Normal cognitive status assessed by direct observation by this Nurse Health Advisor. No abnormalities found.        Immunizations Immunization History  Administered Date(s) Administered   Fluad Quad(high Dose 65+) 03/18/2020   Influenza Split 04/02/2013   Influenza,inj,Quad PF,6+ Mos 03/27/2014, 04/19/2015, 03/21/2016, 04/09/2017, 04/10/2018, 03/01/2019   Pneumococcal Conjugate-13 03/27/2014   Pneumococcal Polysaccharide-23 06/21/2013   Pneumococcal-Unspecified 02/28/2005   Td 02/25/2006   Unspecified SARS-COV-2 Vaccination 07/10/2019, 08/08/2019   Zoster Recombinat (Shingrix) 04/14/2018, 07/18/2018, 11/04/2018, 11/16/2018    TDAP status: Due, Education has been provided regarding the importance of this vaccine. Advised may receive this vaccine at local pharmacy or Health Dept. Aware to provide a copy of the vaccination record if obtained from local pharmacy or Health Dept. Verbalized acceptance and understanding.  Flu Vaccine status: Up  to date  Pneumococcal vaccine status: Up to date  Covid-19 vaccine status: Completed vaccines  Qualifies for Shingles Vaccine? Yes   Zostavax completed No   Shingrix Completed?: Yes  Screening Tests Health Maintenance  Topic Date Due   Hepatitis C Screening  Never done   TETANUS/TDAP  02/26/2016   COVID-19 Vaccine (3 - Mixed Product risk series) 09/05/2019   INFLUENZA VACCINE  12/27/2020   COLONOSCOPY (Pts 45-20yrs Insurance coverage will need to be confirmed)  05/09/2022   DEXA SCAN  Completed   PNA vac Low Risk Adult  Completed   Zoster Vaccines- Shingrix  Completed   HPV VACCINES  Aged Out    Health Maintenance  Health Maintenance Due  Topic Date Due   Hepatitis C Screening  Never done   TETANUS/TDAP  02/26/2016   COVID-19 Vaccine (3 - Mixed Product risk series) 09/05/2019    Colorectal cancer screening: Type of screening: Colonoscopy. Completed 05/09/2017. Repeat every 5 years  Mammogram status: Ordered 11/23/2020. Pt provided with contact info and advised to call to schedule appt.   Bone Density status: Completed 09/18/2019. Results reflect: Bone density results: OSTEOPOROSIS. Repeat every 2 years.  Lung Cancer Screening: (Low Dose CT Chest recommended if Age 44-80 years, 30 pack-year currently smoking OR have quit w/in 15years.) does not qualify.   Lung Cancer Screening Referral: N/A   Additional Screening:  Hepatitis C Screening: does not qualify;   Vision Screening: Recommended annual ophthalmology exams for early detection of glaucoma and other disorders of the eye. Is the patient up to date with their annual eye exam?  No  Who is the provider or what is the name of the office in which the patient attends annual eye exams? Does not go to the eye doctor  If pt is not established with a provider, would they like to be referred to a provider to establish care? No .   Dental Screening: Recommended  annual dental exams for proper oral hygiene  Community  Resource Referral / Chronic Care Management: CRR required this visit?  No   CCM required this visit?  No      Plan:     I have personally reviewed and noted the following in the patient's chart:   Medical and social history Use of alcohol, tobacco or illicit drugs  Current medications and supplements including opioid prescriptions. Patient is not currently taking opioid prescriptions. Functional ability and status Nutritional status Physical activity Advanced directives List of other physicians Hospitalizations, surgeries, and ER visits in previous 12 months Vitals Screenings to include cognitive, depression, and falls Referrals and appointments  In addition, I have reviewed and discussed with patient certain preventive protocols, quality metrics, and best practice recommendations. A written personalized care plan for preventive services as well as general preventive health recommendations were provided to patient.     Ofilia Neas, LPN   1/85/5015   Nurse Notes: None  I have reviewed and agree with the above annual wellness visit documentation Sallee Lange MD [facility]

## 2020-11-23 ENCOUNTER — Other Ambulatory Visit: Payer: Self-pay

## 2020-11-23 ENCOUNTER — Ambulatory Visit (INDEPENDENT_AMBULATORY_CARE_PROVIDER_SITE_OTHER): Payer: PPO

## 2020-11-23 DIAGNOSIS — Z0000 Encounter for general adult medical examination without abnormal findings: Secondary | ICD-10-CM

## 2020-11-23 NOTE — Patient Instructions (Signed)
Terri Harris , Thank you for taking time to come for your Medicare Wellness Visit. I appreciate your ongoing commitment to your health goals. Please review the following plan we discussed and let me know if I can assist you in the future.   Screening recommendations/referrals: Colonoscopy: Up to date, next due  Mammogram: currently due, order placed this visit Bone Density: Up to date, next due 09/17/2021 Recommended yearly ophthalmology/optometry visit for glaucoma screening and checkup Recommended yearly dental visit for hygiene and checkup  Vaccinations: Influenza vaccine: Up to date, next due fall 2022  Pneumococcal vaccine: Completed series  Tdap vaccine: Currently due, you may await and injury to receive  Shingles vaccine: Completed series     Advanced directives: Advance directive discussed with you today. Even though you declined this today please call our office should you change your mind and we can give you the proper paperwork for you to fill out.   Conditions/risks identified: None   Next appointment: None    Preventive Care 65 Years and Older, Female Preventive care refers to lifestyle choices and visits with your health care provider that can promote health and wellness. What does preventive care include? A yearly physical exam. This is also called an annual well check. Dental exams once or twice a year. Routine eye exams. Ask your health care provider how often you should have your eyes checked. Personal lifestyle choices, including: Daily care of your teeth and gums. Regular physical activity. Eating a healthy diet. Avoiding tobacco and drug use. Limiting alcohol use. Practicing safe sex. Taking low-dose aspirin every day. Taking vitamin and mineral supplements as recommended by your health care provider. What happens during an annual well check? The services and screenings done by your health care provider during your annual well check will depend on your age,  overall health, lifestyle risk factors, and family history of disease. Counseling  Your health care provider may ask you questions about your: Alcohol use. Tobacco use. Drug use. Emotional well-being. Home and relationship well-being. Sexual activity. Eating habits. History of falls. Memory and ability to understand (cognition). Work and work Statistician. Reproductive health. Screening  You may have the following tests or measurements: Height, weight, and BMI. Blood pressure. Lipid and cholesterol levels. These may be checked every 5 years, or more frequently if you are over 42 years old. Skin check. Lung cancer screening. You may have this screening every year starting at age 79 if you have a 30-pack-year history of smoking and currently smoke or have quit within the past 15 years. Fecal occult blood test (FOBT) of the stool. You may have this test every year starting at age 79. Flexible sigmoidoscopy or colonoscopy. You may have a sigmoidoscopy every 5 years or a colonoscopy every 10 years starting at age 79. Hepatitis C blood test. Hepatitis B blood test. Sexually transmitted disease (STD) testing. Diabetes screening. This is done by checking your blood sugar (glucose) after you have not eaten for a while (fasting). You may have this done every 1-3 years. Bone density scan. This is done to screen for osteoporosis. You may have this done starting at age 79. Mammogram. This may be done every 1-2 years. Talk to your health care provider about how often you should have regular mammograms. Talk with your health care provider about your test results, treatment options, and if necessary, the need for more tests. Vaccines  Your health care provider may recommend certain vaccines, such as: Influenza vaccine. This is recommended every year. Tetanus,  diphtheria, and acellular pertussis (Tdap, Td) vaccine. You may need a Td booster every 10 years. Zoster vaccine. You may need this after age  79. Pneumococcal 13-valent conjugate (PCV13) vaccine. One dose is recommended after age 79. Pneumococcal polysaccharide (PPSV23) vaccine. One dose is recommended after age 79. Talk to your health care provider about which screenings and vaccines you need and how often you need them. This information is not intended to replace advice given to you by your health care provider. Make sure you discuss any questions you have with your health care provider. Document Released: 06/11/2015 Document Revised: 02/02/2016 Document Reviewed: 03/16/2015 Elsevier Interactive Patient Education  2017 [employer] Prevention in the Home Falls can cause injuries. They can happen to people of all ages. There are many things you can do to make your home safe and to help prevent falls. What can I do on the outside of my home? Regularly fix the edges of walkways and driveways and fix any cracks. Remove anything that might make you trip as you walk through a door, such as a raised step or threshold. Trim any bushes or trees on the path to your home. Use bright outdoor lighting. Clear any walking paths of anything that might make someone trip, such as rocks or tools. Regularly check to see if handrails are loose or broken. Make sure that both sides of any steps have handrails. Any raised decks and porches should have guardrails on the edges. Have any leaves, snow, or ice cleared regularly. Use sand or salt on walking paths during winter. Clean up any spills in your garage right away. This includes oil or grease spills. What can I do in the bathroom? Use night lights. Install grab bars by the toilet and in the tub and shower. Do not use towel bars as grab bars. Use non-skid mats or decals in the tub or shower. If you need to sit down in the shower, use a plastic, non-slip stool. Keep the floor dry. Clean up any water that spills on the floor as soon as it happens. Remove soap buildup in the tub or shower  regularly. Attach bath mats securely with double-sided non-slip rug tape. Do not have throw rugs and other things on the floor that can make you trip. What can I do in the bedroom? Use night lights. Make sure that you have a light by your bed that is easy to reach. Do not use any sheets or blankets that are too big for your bed. They should not hang down onto the floor. Have a firm chair that has side arms. You can use this for support while you get dressed. Do not have throw rugs and other things on the floor that can make you trip. What can I do in the kitchen? Clean up any spills right away. Avoid walking on wet floors. Keep items that you use a lot in easy-to-reach places. If you need to reach something above you, use a strong step stool that has a grab bar. Keep electrical cords out of the way. Do not use floor polish or wax that makes floors slippery. If you must use wax, use non-skid floor wax. Do not have throw rugs and other things on the floor that can make you trip. What can I do with my stairs? Do not leave any items on the stairs. Make sure that there are handrails on both sides of the stairs and use them. Fix handrails that are broken or loose. Make  sure that handrails are as long as the stairways. Check any carpeting to make sure that it is firmly attached to the stairs. Fix any carpet that is loose or worn. Avoid having throw rugs at the top or bottom of the stairs. If you do have throw rugs, attach them to the floor with carpet tape. Make sure that you have a light switch at the top of the stairs and the bottom of the stairs. If you do not have them, ask someone to add them for you. What else can I do to help prevent falls? Wear shoes that: Do not have high heels. Have rubber bottoms. Are comfortable and fit you well. Are closed at the toe. Do not wear sandals. If you use a stepladder: Make sure that it is fully opened. Do not climb a closed stepladder. Make sure that  both sides of the stepladder are locked into place. Ask someone to hold it for you, if possible. Clearly mark and make sure that you can see: Any grab bars or handrails. First and last steps. Where the edge of each step is. Use tools that help you move around (mobility aids) if they are needed. These include: Canes. Walkers. Scooters. Crutches. Turn on the lights when you go into a dark area. Replace any light bulbs as soon as they burn out. Set up your furniture so you have a clear path. Avoid moving your furniture around. If any of your floors are uneven, fix them. If there are any pets around you, be aware of where they are. Review your medicines with your doctor. Some medicines can make you feel dizzy. This can increase your chance of falling. Ask your doctor what other things that you can do to help prevent falls. This information is not intended to replace advice given to you by your health care provider. Make sure you discuss any questions you have with your health care provider. Document Released: 03/11/2009 Document Revised: 10/21/2015 Document Reviewed: 06/19/2014 Elsevier Interactive Patient Education  2017 [employer].

## 2020-12-06 ENCOUNTER — Other Ambulatory Visit: Payer: Self-pay

## 2020-12-06 ENCOUNTER — Ambulatory Visit (INDEPENDENT_AMBULATORY_CARE_PROVIDER_SITE_OTHER): Payer: PPO | Admitting: Family Medicine

## 2020-12-06 ENCOUNTER — Encounter: Payer: Self-pay | Admitting: Family Medicine

## 2020-12-06 VITALS — BP 127/84 | HR 75 | Temp 97.5°F | Ht 65.0 in | Wt 117.0 lb

## 2020-12-06 DIAGNOSIS — Z79891 Long term (current) use of opiate analgesic: Secondary | ICD-10-CM

## 2020-12-06 DIAGNOSIS — R52 Pain, unspecified: Secondary | ICD-10-CM | POA: Diagnosis not present

## 2020-12-06 MED ORDER — PANTOPRAZOLE SODIUM 40 MG PO TBEC
40.0000 mg | DELAYED_RELEASE_TABLET | Freq: Every day | ORAL | 3 refills | Status: DC
Start: 2020-12-06 — End: 2021-04-03

## 2020-12-06 MED ORDER — AMLODIPINE BESYLATE 5 MG PO TABS
5.0000 mg | ORAL_TABLET | Freq: Every day | ORAL | 3 refills | Status: DC
Start: 2020-12-06 — End: 2021-06-17

## 2020-12-06 MED ORDER — OXYCODONE-ACETAMINOPHEN 5-325 MG PO TABS: ORAL_TABLET | ORAL | 0 refills | Status: DC

## 2020-12-06 MED ORDER — OXYCODONE-ACETAMINOPHEN 5-325 MG PO TABS
ORAL_TABLET | ORAL | 0 refills | Status: DC
Start: 2020-12-06 — End: 2021-03-06

## 2020-12-06 NOTE — Progress Notes (Signed)
tox  Subjective:    Patient ID: Terri Harris, female    DOB: [birth year], 80 y.o.   MRN: [id number]  HPI This patient was seen today for chronic pain  The medication list was reviewed and updated.  Location of Pain for which the patient has been treated with regarding narcotics: all over  Onset of this pain: years ago    -Compliance with medication: takes 4 a day  - Number patient states they take daily: 4 a day  -when was the last dose patient took? today  The patient was advised the importance of maintaining medication and not using illegal substances with these.  Here for refills and follow up  The patient was educated that we can provide 3 monthly scripts for their medication, it is their responsibility to follow the instructions.  Side effects or complications from medications: none  Patient is aware that pain medications are meant to minimize the severity of the pain to allow their pain levels to improve to allow for better function. They are aware of that pain medications cannot totally remove their pain.  Due for UDT ( at least once per year) : due today  Scale of 1 to 10 ( 1 is least 10 is most) Your pain level without the medicine: 7 or 8  Your pain level with medication: 0   Scale 1 to 10 ( 1-helps very little, 10 helps very well) How well does your pain medication reduce your pain so you can function better through out the day? 0  Quality of the pain: Burning aching pain  Persistence of the pain: Present 24 hours a day  Modifying factors: Worse with activity        Review of Systems     Objective:   Physical Exam  General-in no acute distress Eyes-no discharge Lungs-respiratory rate normal, CTA CV-no murmurs,RRR Extremities skin warm dry no edema Neuro grossly normal Behavior normal, alert   Patient denies drowsiness or feeling drugged with medicine she states it does help her function    Assessment & Plan:  The patient was seen in  followup for chronic pain. A review over at their current pain status was discussed. Drug registry was checked. Prescriptions were given.  Regular follow-up recommended. Discussion was held regarding the importance of compliance with medication as well as pain medication contract.  Patient was informed that medication may cause drowsiness and should not be combined  with other medications/alcohol or street drugs. If the patient feels medication is causing altered alertness then do not drive or operate dangerous equipment. No lab work indicated currently

## 2020-12-11 LAB — TOXASSURE SELECT 13 (MW), URINE

## 2021-01-07 DIAGNOSIS — M546 Pain in thoracic spine: Secondary | ICD-10-CM | POA: Diagnosis not present

## 2021-01-07 DIAGNOSIS — I10 Essential (primary) hypertension: Secondary | ICD-10-CM | POA: Diagnosis not present

## 2021-01-19 DIAGNOSIS — M546 Pain in thoracic spine: Secondary | ICD-10-CM | POA: Diagnosis not present

## 2021-01-19 DIAGNOSIS — S32009K Unspecified fracture of unspecified lumbar vertebra, subsequent encounter for fracture with nonunion: Secondary | ICD-10-CM | POA: Diagnosis not present

## 2021-01-19 DIAGNOSIS — I10 Essential (primary) hypertension: Secondary | ICD-10-CM | POA: Diagnosis not present

## 2021-02-24 ENCOUNTER — Ambulatory Visit: Payer: PPO | Admitting: Orthopaedic Surgery

## 2021-03-03 ENCOUNTER — Other Ambulatory Visit: Payer: Self-pay

## 2021-03-03 ENCOUNTER — Ambulatory Visit (INDEPENDENT_AMBULATORY_CARE_PROVIDER_SITE_OTHER): Payer: PPO | Admitting: Family Medicine

## 2021-03-03 ENCOUNTER — Telehealth: Payer: Self-pay | Admitting: Family Medicine

## 2021-03-03 DIAGNOSIS — U071 COVID-19: Secondary | ICD-10-CM | POA: Diagnosis not present

## 2021-03-03 MED ORDER — MOLNUPIRAVIR EUA 200MG CAPSULE: 4.0000 | ORAL_CAPSULE | Freq: Two times a day (BID) | ORAL | 0 refills | Status: DC

## 2021-03-03 MED ORDER — NIRMATRELVIR/RITONAVIR (PAXLOVID)TABLET: 3.0000 | ORAL_TABLET | Freq: Two times a day (BID) | ORAL | 0 refills | Status: AC

## 2021-03-03 NOTE — Progress Notes (Signed)
   Subjective:    Patient ID: Terri Harris, female    DOB: June 08, 1940, 80 y.o.   MRN: [id number] Telephone only video not capable HPI Pt tested positive for COVID. Pt having congestion and cough.  Body aches headache congestion coughing slight chest congestion but no significant shortness of breath no vomiting or diarrhea Virtual Visit via Telephone Note  I connected with Terri Harris on 03/03/21 at  5:10 PM EDT by telephone and verified that I am speaking with the correct person using two identifiers.  Location: Patient: home Provider: office   I discussed the limitations, risks, security and privacy concerns of performing an evaluation and management service by telephone and the availability of in person appointments. I also discussed with the patient that there may be a patient responsible charge related to this service. The patient expressed understanding and agreed to proceed.   History of Present Illness:    Observations/Objective:   Assessment and Plan:   Follow Up Instructions:    I discussed the assessment and treatment plan with the patient. The patient was provided an opportunity to ask questions and all were answered. The patient agreed with the plan and demonstrated an understanding of the instructions.   The patient was advised to call back or seek an in-person evaluation if the symptoms worsen or if the condition fails to improve as anticipated.  I provided  14 minutes of non-face-to-face time during this encounter.       Review of Systems     Objective:   Physical Exam  Today's visit was via telephone Physical exam was not possible for this visit       Assessment & Plan:  Paxlovid Side effects discussed changes with amlodipine recommended Warning signs discussed Follow-up if ongoing troubles or problems  Covid infection This is a viral process.  Mild cases are treated with supportive measures at home such as Tylenol rest fluids.  In some  situations monoclonal antibodies may be appropriate depending on the patient's risk criteria.  The patient was educated regarding progressive illness including respiratory, persistent vomiting, change in mental status.  If any of these occur ER evaluation is recommended. Patient was educated about the following as well Covid-19 respiratory warning: Covid-19 is a virus that causes hypoxia (low oxygen level in blood) in some people. If you develop any changes in your usual breathing pattern: difficulty catching your breath, more short winded with activity or with resting, or anything that concerns you about your breathing, do not hesitate to go to the emergency department immediately for evaluation. Please do not delay to get treatment.   Agrees with plan of care discussed today. Understands warning signs to seek further care: Chest pain, shortness of breath, mental confusion, profuse vomiting, any significant change in health. Understands to follow-up if symptoms do not improve, or worsen.

## 2021-03-03 NOTE — Telephone Encounter (Signed)
[name], gillott are scheduled for a virtual visit with your provider today.    Just as we do with appointments in the office, we must obtain your consent to participate.  Your consent will be active for this visit and any virtual visit you may have with one of our providers in the next 365 days.    If you have a MyChart account, I can also send a copy of this consent to you electronically.  All virtual visits are billed to your insurance company just like a traditional visit in the office.  As this is a virtual visit, video technology does not allow for your provider to perform a traditional examination.  This may limit your provider's ability to fully assess your condition.  If your provider identifies any concerns that need to be evaluated in person or the need to arrange testing such as labs, EKG, etc, we will make arrangements to do so.    Although advances in technology are sophisticated, we cannot ensure that it will always work on either your end or our end.  If the connection with a video visit is poor, we may have to switch to a telephone visit.  With either a video or telephone visit, we are not always able to ensure that we have a secure connection.   I need to obtain your verbal consent now.   Are you willing to proceed with your visit today?   Terri Harris has provided verbal consent on 03/03/2021 for a virtual visit (video or telephone).   Vicente Males, LPN [id number]  1:28 PM

## 2021-03-03 NOTE — Telephone Encounter (Signed)
Pt daughter contacted and verbalized understanding. Pt is willing to try Paxlovid. Pt placed on schedule for phone visit. Paxlovid Paragraph discussed with daughter

## 2021-03-03 NOTE — Telephone Encounter (Signed)
Daughter states that her mom has deep congestion and chest congestion- NO SOB but she tested very positive for Covid

## 2021-03-03 NOTE — Telephone Encounter (Signed)
Please talk with patient, we can do a phone visit with patient if interested in the medicine  For patients were interested in Paxlovid please review the following: This medication is only effective if started within the first 5 days of the illness.  Paxlovid is a antiviral medicine that can help lessen the severity of COVID and more importantly reduce the risk of severe complications and hospitalizations. Side effects-generally well-tolerated This medication is taken twice daily for 5 days with a snack.  Approximately 15% of people will have nausea with the medicine.  Approximately 20% can have some loose stools or diarrhea.  30% - 40% can have a funny metallic taste with their food for 5 days. If a person chooses to utilize this medicine we always check there recent labs to look at the kidney functions plus also their medication list.  There are certain medications that may not get along with this medicine we will advise them what they can or cannot take.  Finally in situations where due to medications or kidney functions that this medicine is not possible there is a different medicine that can be used in those situations.  If patient is not interested please document accordingly and educate them that if they start having significant shortness of breath or worsening illness to be seen and evaluated properly either office or ER depending on severity.

## 2021-03-03 NOTE — Telephone Encounter (Signed)
Daughter called in stating patient has taken a covid test and it is positive. Per husbands voicemail from yesterday patient has been "barking" all week.  CB# [phone]

## 2021-03-06 ENCOUNTER — Other Ambulatory Visit: Payer: Self-pay | Admitting: Family Medicine

## 2021-03-06 MED ORDER — OXYCODONE-ACETAMINOPHEN 5-325 MG PO TABS: ORAL_TABLET | ORAL | 0 refills | Status: DC

## 2021-03-09 ENCOUNTER — Ambulatory Visit: Payer: PPO | Admitting: Family Medicine

## 2021-03-22 ENCOUNTER — Telehealth: Payer: Self-pay | Admitting: Family Medicine

## 2021-03-22 NOTE — Telephone Encounter (Signed)
Dr Nicki Reaper sent in script 03/06/21 and may be filled 03/20/21- Patient notified and will contact pharmacy for refill.

## 2021-03-22 NOTE — Telephone Encounter (Signed)
Patient is requesting refill on oxycodone 5/325 has appointment on 11/3 for pain management had to cancel in October for follow up due to having Covid. [facility] [place]

## 2021-03-28 ENCOUNTER — Inpatient Hospital Stay (HOSPITAL_COMMUNITY)
Admission: EM | Admit: 2021-03-28 | Discharge: 2021-04-03 | DRG: 390 | Disposition: A | Discharge status: Home / Self Care | Payer: PPO | Attending: Family Medicine | Admitting: Family Medicine

## 2021-03-28 ENCOUNTER — Encounter (HOSPITAL_COMMUNITY): Payer: Self-pay | Admitting: *Deleted

## 2021-03-28 ENCOUNTER — Emergency Department (HOSPITAL_COMMUNITY): Payer: PPO

## 2021-03-28 ENCOUNTER — Observation Stay (HOSPITAL_COMMUNITY): Payer: PPO

## 2021-03-28 DIAGNOSIS — Z981 Arthrodesis status: Secondary | ICD-10-CM

## 2021-03-28 DIAGNOSIS — K5651 Intestinal adhesions [bands], with partial obstruction: Principal | ICD-10-CM | POA: Diagnosis present

## 2021-03-28 DIAGNOSIS — Z87891 Personal history of nicotine dependence: Secondary | ICD-10-CM

## 2021-03-28 DIAGNOSIS — Z23 Encounter for immunization: Secondary | ICD-10-CM | POA: Diagnosis present

## 2021-03-28 DIAGNOSIS — Z8249 Family history of ischemic heart disease and other diseases of the circulatory system: Secondary | ICD-10-CM | POA: Diagnosis not present

## 2021-03-28 DIAGNOSIS — E785 Hyperlipidemia, unspecified: Secondary | ICD-10-CM | POA: Diagnosis present

## 2021-03-28 DIAGNOSIS — I10 Essential (primary) hypertension: Secondary | ICD-10-CM

## 2021-03-28 DIAGNOSIS — K439 Ventral hernia without obstruction or gangrene: Secondary | ICD-10-CM | POA: Diagnosis not present

## 2021-03-28 DIAGNOSIS — K6389 Other specified diseases of intestine: Secondary | ICD-10-CM | POA: Diagnosis not present

## 2021-03-28 DIAGNOSIS — E7800 Pure hypercholesterolemia, unspecified: Secondary | ICD-10-CM | POA: Diagnosis present

## 2021-03-28 DIAGNOSIS — R0902 Hypoxemia: Secondary | ICD-10-CM | POA: Diagnosis not present

## 2021-03-28 DIAGNOSIS — K219 Gastro-esophageal reflux disease without esophagitis: Secondary | ICD-10-CM | POA: Diagnosis present

## 2021-03-28 DIAGNOSIS — G8929 Other chronic pain: Secondary | ICD-10-CM | POA: Diagnosis present

## 2021-03-28 DIAGNOSIS — Z8542 Personal history of malignant neoplasm of other parts of uterus: Secondary | ICD-10-CM | POA: Diagnosis not present

## 2021-03-28 DIAGNOSIS — K5900 Constipation, unspecified: Secondary | ICD-10-CM | POA: Diagnosis not present

## 2021-03-28 DIAGNOSIS — J449 Chronic obstructive pulmonary disease, unspecified: Secondary | ICD-10-CM

## 2021-03-28 DIAGNOSIS — R109 Unspecified abdominal pain: Secondary | ICD-10-CM

## 2021-03-28 DIAGNOSIS — R918 Other nonspecific abnormal finding of lung field: Secondary | ICD-10-CM | POA: Diagnosis not present

## 2021-03-28 DIAGNOSIS — R54 Age-related physical debility: Secondary | ICD-10-CM | POA: Diagnosis present

## 2021-03-28 DIAGNOSIS — Z8616 Personal history of COVID-19: Secondary | ICD-10-CM

## 2021-03-28 DIAGNOSIS — M48062 Spinal stenosis, lumbar region with neurogenic claudication: Secondary | ICD-10-CM | POA: Diagnosis present

## 2021-03-28 DIAGNOSIS — U071 COVID-19: Secondary | ICD-10-CM

## 2021-03-28 DIAGNOSIS — M545 Low back pain, unspecified: Secondary | ICD-10-CM | POA: Diagnosis present

## 2021-03-28 DIAGNOSIS — Z79899 Other long term (current) drug therapy: Secondary | ICD-10-CM | POA: Diagnosis not present

## 2021-03-28 DIAGNOSIS — Z833 Family history of diabetes mellitus: Secondary | ICD-10-CM

## 2021-03-28 DIAGNOSIS — Z885 Allergy status to narcotic agent status: Secondary | ICD-10-CM

## 2021-03-28 DIAGNOSIS — E876 Hypokalemia: Secondary | ICD-10-CM | POA: Diagnosis not present

## 2021-03-28 DIAGNOSIS — K56609 Unspecified intestinal obstruction, unspecified as to partial versus complete obstruction: Secondary | ICD-10-CM | POA: Diagnosis present

## 2021-03-28 DIAGNOSIS — K5939 Other megacolon: Secondary | ICD-10-CM | POA: Diagnosis not present

## 2021-03-28 DIAGNOSIS — Z4682 Encounter for fitting and adjustment of non-vascular catheter: Secondary | ICD-10-CM | POA: Diagnosis not present

## 2021-03-28 DIAGNOSIS — Z888 Allergy status to other drugs, medicaments and biological substances status: Secondary | ICD-10-CM

## 2021-03-28 LAB — CBC
HCT: 46.4 % — ABNORMAL HIGH (ref 36.0–46.0)
Hemoglobin: 15.4 g/dL — ABNORMAL HIGH (ref 12.0–15.0)
MCH: 32.8 pg (ref 26.0–34.0)
MCHC: 33.2 g/dL (ref 30.0–36.0)
MCV: 98.9 fL (ref 80.0–100.0)
Platelets: 335 10*3/uL (ref 150–400)
RBC: 4.69 MIL/uL (ref 3.87–5.11)
RDW: 12.8 % (ref 11.5–15.5)
WBC: 16.3 10*3/uL — ABNORMAL HIGH (ref 4.0–10.5)
nRBC: 0 % (ref 0.0–0.2)

## 2021-03-28 LAB — COMPREHENSIVE METABOLIC PANEL
ALT: 15 U/L (ref 0–44)
AST: 28 U/L (ref 15–41)
Albumin: 4.8 g/dL (ref 3.5–5.0)
Alkaline Phosphatase: 97 U/L (ref 38–126)
Anion gap: 14 (ref 5–15)
BUN: 14 mg/dL (ref 8–23)
CO2: 28 mmol/L (ref 22–32)
Calcium: 10 mg/dL (ref 8.9–10.3)
Chloride: 95 mmol/L — ABNORMAL LOW (ref 98–111)
Creatinine, Ser: 0.94 mg/dL (ref 0.44–1.00)
GFR, Estimated: >60 mL/min (ref >60)
Glucose, Bld: 130 mg/dL — ABNORMAL HIGH (ref 70–99)
Potassium: 4 mmol/L (ref 3.5–5.1)
Sodium: 137 mmol/L (ref 135–145)
Total Bilirubin: 0.5 mg/dL (ref 0.3–1.2)
Total Protein: 8.9 g/dL — ABNORMAL HIGH (ref 6.5–8.1)

## 2021-03-28 LAB — RESP PANEL BY RT-PCR (FLU A&B, COVID) ARPGX2
Influenza A by PCR: NEGATIVE
Influenza B by PCR: NEGATIVE
SARS Coronavirus 2 by RT PCR: POSITIVE — AB

## 2021-03-28 LAB — LIPASE, BLOOD: Lipase: 31 U/L (ref 11–51)

## 2021-03-28 MED ORDER — DEXTROSE-NACL 5-0.9 % IV SOLN
INTRAVENOUS | Status: DC
Start: 2021-03-28 — End: 2021-04-03

## 2021-03-28 MED ORDER — HYDROMORPHONE HCL 1 MG/ML IJ SOLN
1.0000 mg | Freq: Once | INTRAMUSCULAR | Status: AC
  Administered 2021-03-28: 1 mg via INTRAVENOUS
  Filled 2021-03-28: qty 1

## 2021-03-28 MED ORDER — SODIUM CHLORIDE 0.9 % IV SOLN: 8.0000 mg | Freq: Once | INTRAVENOUS | Status: DC

## 2021-03-28 MED ORDER — ACETAMINOPHEN 325 MG PO TABS
650.0000 mg | ORAL_TABLET | Freq: Four times a day (QID) | ORAL | Status: DC | PRN
  Administered 2021-04-02: 650 mg via ORAL
  Filled 2021-03-28: qty 2

## 2021-03-28 MED ORDER — HYDROMORPHONE HCL 1 MG/ML IJ SOLN
1.0000 mg | Freq: Once | INTRAMUSCULAR | Status: DC
Start: 2021-03-28 — End: 2021-03-28

## 2021-03-28 MED ORDER — ONDANSETRON HCL 4 MG/2ML IJ SOLN
4.0000 mg | Freq: Four times a day (QID) | INTRAMUSCULAR | Status: DC | PRN
  Administered 2021-03-29 (×2): 4 mg via INTRAVENOUS
  Filled 2021-03-28 (×2): qty 2

## 2021-03-28 MED ORDER — HYDROMORPHONE HCL 1 MG/ML IJ SOLN
0.5000 mg | INTRAMUSCULAR | Status: DC | PRN
  Administered 2021-03-29: 0.5 mg via INTRAVENOUS
  Filled 2021-03-28: qty 1

## 2021-03-28 MED ORDER — ONDANSETRON HCL 4 MG/2ML IJ SOLN
4.0000 mg | Freq: Once | INTRAMUSCULAR | Status: AC
  Administered 2021-03-28: 4 mg via INTRAVENOUS
  Filled 2021-03-28: qty 2

## 2021-03-28 MED ORDER — PANTOPRAZOLE SODIUM 40 MG IV SOLR
40.0000 mg | INTRAVENOUS | Status: DC
  Administered 2021-03-28 – 2021-04-02 (×6): 40 mg via INTRAVENOUS
  Filled 2021-03-28 (×6): qty 40

## 2021-03-28 MED ORDER — ONDANSETRON HCL 4 MG PO TABS: 4.0000 mg | ORAL_TABLET | Freq: Four times a day (QID) | ORAL | Status: DC | PRN

## 2021-03-28 MED ORDER — IPRATROPIUM-ALBUTEROL 0.5-2.5 (3) MG/3ML IN SOLN: 3.0000 mL | RESPIRATORY_TRACT | Status: DC | PRN

## 2021-03-28 MED ORDER — IOHEXOL 9 MG/ML PO SOLN
ORAL | Status: AC
  Filled 2021-03-28: qty 1000

## 2021-03-28 MED ORDER — POLYETHYLENE GLYCOL 3350 17 G PO PACK: 17.0000 g | PACK | Freq: Every day | ORAL | Status: DC | PRN

## 2021-03-28 MED ORDER — HYDROMORPHONE HCL 1 MG/ML IJ SOLN
1.0000 mg | Freq: Once | INTRAMUSCULAR | Status: AC
Start: 2021-03-28 — End: 2021-03-28
  Administered 2021-03-28: 1 mg via INTRAVENOUS
  Filled 2021-03-28: qty 1

## 2021-03-28 MED ORDER — LABETALOL HCL 5 MG/ML IV SOLN: 10.0000 mg | INTRAVENOUS | Status: DC | PRN

## 2021-03-28 MED ORDER — HEPARIN SODIUM (PORCINE) 5000 UNIT/ML IJ SOLN
5000.0000 [IU] | Freq: Three times a day (TID) | INTRAMUSCULAR | Status: DC
  Administered 2021-03-28 – 2021-04-03 (×17): 5000 [IU] via SUBCUTANEOUS
  Filled 2021-03-28 (×18): qty 1

## 2021-03-28 MED ORDER — IOHEXOL 300 MG/ML  SOLN
75.0000 mL | Freq: Once | INTRAMUSCULAR | Status: AC | PRN
  Administered 2021-03-28: 75 mL via INTRAVENOUS

## 2021-03-28 MED ORDER — ACETAMINOPHEN 650 MG RE SUPP
650.0000 mg | Freq: Four times a day (QID) | RECTAL | Status: DC | PRN
  Administered 2021-03-29 – 2021-03-30 (×4): 650 mg via RECTAL
  Filled 2021-03-28 (×4): qty 1

## 2021-03-28 MED ORDER — SODIUM CHLORIDE 0.9 % IV BOLUS
500.0000 mL | Freq: Once | INTRAVENOUS | Status: AC
Start: 2021-03-28 — End: 2021-03-29
  Administered 2021-03-28: 500 mL via INTRAVENOUS

## 2021-03-28 NOTE — ED Notes (Signed)
Patient transported to CT 

## 2021-03-28 NOTE — ED Notes (Signed)
Date and time results received: 03/28/21 6:39 PM  Test: Covid Critical Value: positive  Name of Provider Notified:   Orders Received? Or Actions Taken?: see orders

## 2021-03-28 NOTE — ED Triage Notes (Signed)
States she had covid the beginning of the month has had abdominal pain ever since

## 2021-03-28 NOTE — H&P (Addendum)
History and Physical    Terri Harris FKC:[id number] DOB: Feb 06, 1941 DOA: 03/28/2021  PCP: Kathyrn Drown, MD   Patient coming from: Home  I have personally briefly reviewed patient's old medical records in [place]  Chief Complaint: Abdominal pain  HPI: Terri Harris is a 80 y.o. female with medical history significant for COPD, small bowel obstruction, hypertension. Patient presented to the ED with complaints of abdominal pain that started when she tested positive for COVID about 4 weeks ago.  She had some mild cough, but mostly congestion.  She had some mild nausea vomiting and diarrhea.  Most of the symptoms resolved.  But today she reports multiple episodes of dry heaving and retching, and spitting of yellowish liquid.  She reports abdominal bloating.  Her last bowel movement was 3 days ago.  She has taken Dulcolax to help with bowel movements but this did not help.  ED Course: Stable vitals.  WBC 16.3.  Abdominal CT shows small bowel obstruction with transition point in right upper abdomen at the level of the small bowel resection anastomosis. EDP talked to Dr. Arnoldo Morale, recommended NG tube will see in the morning.  Review of Systems: As per HPI all other systems reviewed and negative.  Past Medical History:  Diagnosis Date   ANA positive 01/2001   Arthritis    Arthritis 07/02/2013   ANA positive   Cancer (HCC)    partial hysterectomy age 81-uterine cancer   GERD (gastroesophageal reflux disease)    Hx of tobacco use, presenting hazards to health 07/02/2013   Hypercholesteremia    Hypertension    Ileus, postoperative ([place]) 07/02/2013   Osteoporosis    Pneumonia    hx of   SBO (small bowel obstruction) ([place]) 06/20/2103    Past Surgical History:  Procedure Laterality Date   ABDOMINAL HYSTERECTOMY     ANTERIOR LAT LUMBAR FUSION N/A 06/08/2014   Procedure: LUMBAR 2-3 LUMBAR 3-4 LUMBAR 4-5 ANTEROLATERAL DECOMPRESSION/FUSION WITH PERCUTANEOUS PEDICLE SCREWS;  Surgeon:  Kristeen Miss, MD;  Location: [facility];  Service: Neurosurgery;  Laterality: N/A;  L2-3 L3-4 L4-5 ANTEROLATERAL DECOMPRESSION/FUSION WITH PERCUTANEOUS PEDICLE SCREWS   ANTERIOR LAT LUMBAR FUSION Left 01/07/2019   Procedure: Lumbar one-two Anterolateral lumbar interbody fusion with lateral plate fixation;  Surgeon: Kristeen Miss, MD;  Location: [place];  Service: Neurosurgery;  Laterality: Left;   BACK SURGERY     BOWEL RESECTION N/A 06/20/2013   Procedure: SMALL BOWEL RESECTION;  Surgeon: Harl Bowie, MD;  Location: [place];  Service: General;  Laterality: N/A;   COLON SURGERY     COLONOSCOPY  10/25/2011   Procedure: COLONOSCOPY;  Surgeon: Rogene Houston, MD;  Location: [facility];  Service: Endoscopy;  Laterality: N/A;  930   COLONOSCOPY N/A 05/09/2017   Procedure: COLONOSCOPY;  Surgeon: Rogene Houston, MD;  [facility]: [facility];  Service: Endoscopy;  Laterality: N/A;  1030   ESOPHAGEAL DILATION N/A 05/25/2015   Procedure: ESOPHAGEAL DILATION;  Surgeon: Rogene Houston, MD;  [facility];  Service: Endoscopy;  Laterality: N/A;   ESOPHAGOGASTRODUODENOSCOPY  02/2009   ESOPHAGOGASTRODUODENOSCOPY N/A 05/25/2015   Procedure: ESOPHAGOGASTRODUODENOSCOPY (EGD);  Surgeon: Rogene Houston, MD;  [facility];  Service: Endoscopy;  Laterality: N/A;   HERNIA REPAIR Left    groin   INGUINAL HERNIA REPAIR Left 09/11/2013   Procedure: HERNIA REPAIR INGUINAL INCARCERATED;  Surgeon: Harl Bowie, MD;  Location: [place];  Service: General;  Laterality: Left;  LAPAROTOMY N/A 06/20/2013   Procedure: EXPLORATORY LAPAROTOMY;  Surgeon: Harl Bowie, MD;  Location: [place];  Service: General;  Laterality: N/A;   LUMBAR PERCUTANEOUS PEDICLE SCREW 3 LEVEL N/A 06/08/2014   Procedure: LUMBAR PERCUTANEOUS PEDICLE SCREW 3 LEVEL;  Surgeon: Kristeen Miss, MD;  Location: [place] [facility];  Service: Neurosurgery;  Laterality: N/A;   POLYPECTOMY  05/09/2017   Procedure: POLYPECTOMY;   Surgeon: Rogene Houston, MD;  [facility];  Service: Endoscopy;;  sigmoid colon   TONSILLECTOMY       reports that she quit smoking about 9 years ago. Her smoking use included cigarettes. She smoked an average of 0.25 packs per day. She has never used smokeless tobacco. She reports that she does not drink alcohol and does not use drugs.  Allergies  Allergen Reactions   Codeine Nausea And Vomiting   Fosamax [Alendronate Sodium] Other (See Comments)    gastritis    Family History  Problem Relation Age of Onset   Hypertension Mother    Heart disease Father    Diabetes Sister    Heart disease Sister    Diabetes Brother    Heart disease Brother    Anesthesia problems Neg Hx    Hypotension Neg Hx    Malignant hyperthermia Neg Hx    Pseudochol deficiency Neg Hx    Prior to Admission medications   Medication Sig Start Date End Date Taking? Authorizing Provider  acetaminophen (TYLENOL) 500 MG tablet Take 1,000 mg by mouth every 6 (six) hours as needed for mild pain or headache.    Yes [provider]  alum & mag hydroxide-simeth (MAALOX/MYLANTA) 200-200-20 MG/5ML suspension Take 15 mLs by mouth every 6 (six) hours as needed for indigestion or heartburn.   Yes [provider]  amLODipine (NORVASC) 5 MG tablet Take 1 tablet (5 mg total) by mouth daily. 12/06/20  Yes Kathyrn Drown, MD  cyanocobalamin 1000 MCG tablet Take 1,000 mcg by mouth daily.   Yes [provider]  diphenhydrAMINE (BENADRYL) 25 MG tablet Take 25 mg by mouth every 6 (six) hours as needed.   Yes [provider]  oxyCODONE-acetaminophen (PERCOCET/ROXICET) 5-325 MG tablet 1 qid prn 12/06/20  Yes Luking, Scott A, MD  pantoprazole (PROTONIX) 40 MG tablet Take 1 tablet (40 mg total) by mouth daily. 12/06/20  Yes Kathyrn Drown, MD  zoledronic acid (RECLAST) 5 MG/100ML SOLN injection Inject 5 mg into the vein once. 05/15/18  Yes [provider]  oxyCODONE-acetaminophen  (PERCOCET/ROXICET) 5-325 MG tablet One qid prn Patient not taking: Reported on 03/28/2021 12/06/20   Kathyrn Drown, MD  oxyCODONE-acetaminophen (PERCOCET/ROXICET) 5-325 MG tablet 1 qid prn Patient not taking: Reported on 03/28/2021 03/06/21   Kathyrn Drown, MD    Physical Exam: Vitals:   03/28/21 1400 03/28/21 1522 03/28/21 1530 03/28/21 1600  BP: (!) 168/103 127/81 135/69 129/67  Pulse: 91 70 68 66  Resp: (!) 28 17 17 18   Temp:      TempSrc:      SpO2: 96% 99% 98% 97%    Constitutional: Retching,  Vitals:   03/28/21 1400 03/28/21 1522 03/28/21 1530 03/28/21 1600  BP: (!) 168/103 127/81 135/69 129/67  Pulse: 91 70 68 66  Resp: (!) 28 17 17 18   Temp:      TempSrc:      SpO2: 96% 99% 98% 97%   Eyes: PERRL, lids and conjunctivae normal ENMT: Mucous membranes are moist.   Neck: normal, supple,  no masses, no thyromegaly Respiratory: clear to auscultation bilaterally, no wheezing, no crackles. Normal respiratory effort. No accessory muscle use.  Cardiovascular: Regular rate and rhythm, no murmurs / rubs / gallops. No extremity edema. 2+ pedal pulses.  Abdomen: Mild to moderate diffuse abdominal tenderness, no masses palpated. No hepatosplenomegaly.   Musculoskeletal: no clubbing / cyanosis. No joint deformity upper and lower extremities. Good ROM, no contractures. Normal muscle tone.  Skin: no rashes, lesions, ulcers. No induration Neurologic: No apparent cranial nerve abnormalities, moving extremities spontaneously.  Psychiatric: Normal judgment and insight. Alert and oriented x 3. Normal mood.   Labs on Admission: I have personally reviewed following labs and imaging studies  CBC: Recent Labs  Lab 03/28/21 1338  WBC 16.3*  HGB 15.4*  HCT 46.4*  MCV 98.9  PLT 607   Basic Metabolic Panel: Recent Labs  Lab 03/28/21 1338  NA 137  K 4.0  CL 95*  CO2 28  GLUCOSE 130*  BUN 14  CREATININE 0.94  CALCIUM 10.0   Liver Function Tests: Recent Labs  Lab 03/28/21 1338   AST 28  ALT 15  ALKPHOS 97  BILITOT 0.5  PROT 8.9*  ALBUMIN 4.8   Recent Labs  Lab 03/28/21 1338  LIPASE 31    Radiological Exams on Admission: CT ABDOMEN PELVIS W CONTRAST  Result Date: 03/28/2021 CLINICAL DATA:  Nonlocalized acute abdominal pain. States she had covid the beginning of the month has had general abdominal pain/bloating ever since. Hx of uterine cancer, HTN, SBO, hysterectomy, bowel resection, hernia repair. EXAM: CT ABDOMEN AND PELVIS WITH CONTRAST TECHNIQUE: Multidetector CT imaging of the abdomen and pelvis was performed using the standard protocol following bolus administration of intravenous contrast. CONTRAST:  25mL OMNIPAQUE IOHEXOL 300 MG/ML  SOLN COMPARISON:  CT abdomen pelvis 12/28/2014. FINDINGS: Lower chest: Patchy airspace opacities of the lung bases. Centrilobular emphysematous changes. Hepatobiliary: No focal liver abnormality. No gallstones, gallbladder wall thickening, or pericholecystic fluid. No biliary dilatation. Pancreas: No focal lesion. Normal pancreatic contour. No surrounding inflammatory changes. No main pancreatic ductal dilatation. Spleen: Normal in size without focal abnormality. Adrenals/Urinary Tract: No adrenal nodule bilaterally. Bilateral kidneys enhance symmetrically. Subcentimeter hypodensities are too small to characterize. No hydronephrosis. No hydroureter. The urinary bladder is unremarkable. On delayed imaging, there is no urothelial wall thickening and there are no filling defects in the opacified portions of the bilateral collecting systems or ureters. Stomach/Bowel: Fluid dilatation of the small bowel with a transition point in the right upper abdomen the level of a small bowel resection anastomosis. Stomach is within normal limits. No evidence of bowel wall thickening or dilatation. The distal bowel and majority of the colon are decompressed. The appendix is not definitely identified. Vascular/Lymphatic: No abdominal aorta or iliac  aneurysm. Severe atherosclerotic plaque of the aorta and its branches. No abdominal, pelvic, or inguinal lymphadenopathy. Reproductive: Status post hysterectomy. No adnexal masses. Other: No intraperitoneal free fluid. No intraperitoneal free gas. No organized fluid collection. Musculoskeletal: No abdominal wall hernia or abnormality. No suspicious lytic or blastic osseous lesions. No acute displaced fracture. Multilevel degenerative changes of the spine in a patient with L1 through L5 posterior and interbody fusion. IMPRESSION: 1. Small-bowel obstruction with transition point in the right upper abdomen at the level of the small bowel resection anastomosis. No associated bowel obstruction or findings to suggest bowel ischemia. 2. Patchy airspace opacities of the lung bases which could represent infection/inflammation. Recommend chest x-ray for further evaluation. 3.  Aortic Atherosclerosis (ICD10-I70.0) - severe 4.  Emphysema (ICD10-J43.9). Electronically Signed   By: Iven Finn M.D.   On: 03/28/2021 16:08    EKG: Independently reviewed.  Sinus rhythm rate 97.  QTc 431.  No significant change from prior.  Assessment/Plan Principal Problem:   SBO (small bowel obstruction) (HCC) Active Problems:   COPD (chronic obstructive pulmonary disease) (HCC)   HTN (hypertension)   COVID-19 virus infection  Small bowel obstruction- with abdominal distention, pain, vomiting, last bowel movement 3 days ago. Abd CT-  Small-bowel obstruction with transition point in the right upper abdomen at the level of the small bowel resection anastomosis. - EDP talked to Dr. Arnoldo Morale, recommended admission, NG tube -NPO -NG tube - 500 bolus, continue D5 N/s 75 cc/hr  -IV Zofran -IV Protonix 40 daily -Continue Iv Dilaudud 0.5mg  q4hr prn  COVID-19 infection-reports testing positive 6th of October.  She tells me she was prescribed a medication for COVID she completed a 5-day course of the medication - took 2 tablets  morning and night..  She had mostly congestive symptoms and mild GI symptoms which have resolved.  She is on room air.  Received 3 doses of Moderna.  CT abdomen shows patchy airspace opacities in the lung bases which could represent infection or inflammation, chest x-ray recommended for further evaluation. -Follow-up chest x-ray-patchy left base infiltrate infectious or inflammatory in nature -Does not require isolation/precautions. -No treatment indicated.  Leukocytosis-16.3.  No focus of infection identified at this time. - Trend  COPD -reports chronic difficulty breathing that is unchanged.  Quit smoking cigarettes 15 to 20 years ago. -DuoNebs as needed  Hypertension-stable. -Hold Norvasc 5mg  while NPO -IV 10mg  Labetalol for systolic greater than 051.  DVT prophylaxis: Heparin Code Status: Full code Family Communication: None at bedside Disposition Plan: ~ 2 days, to home, pending resolution of SBO conservatively or otherwise. Consults called: Gen surg Admission status: Obs, med surg     Bethena Roys MD [facility]  03/28/2021, 8:05 PM

## 2021-03-28 NOTE — ED Provider Notes (Signed)
[place] [facility] Provider Note   CSN: [id number] Arrival date & time: 03/28/21  1308     History Chief Complaint  Patient presents with  . Abdominal Pain    Terri Harris is a 80 y.o. female.  Pt reports she had covid 4 weeks ago and has had abdominal pain since.  Pt reports increasing severity.  Pain is severe today.  Pt reports a history of having hysterectomy at 31.  Pt denies fever or chills.  Pt denies chest  pain  The history is provided by the patient. No language interpreter was used.  Abdominal Pain Pain location:  Generalized Pain quality: aching   Pain radiates to:  Does not radiate Pain severity:  Moderate Onset quality:  Gradual Timing:  Constant Progression:  Worsening Chronicity:  New Context: not laxative use   Relieved by:  Nothing Worsened by:  Nothing Ineffective treatments:  None tried Risk factors: no alcohol abuse       Past Medical History:  Diagnosis Date  . ANA positive 01/2001  . Arthritis   . Arthritis 07/02/2013   ANA positive  . Cancer [facility])    partial hysterectomy age 67-uterine cancer  . GERD (gastroesophageal reflux disease)   . Hx of tobacco use, presenting hazards to health 07/02/2013  . Hypercholesteremia   . Hypertension   . Ileus, postoperative ([place]) 07/02/2013  . Osteoporosis   . Pneumonia    hx of  . SBO (small bowel obstruction) ([place]) 06/20/2103    Patient Active Problem List   Diagnosis Date Noted  . Vitamin B12 deficiency 12/09/2019  . Lumbar stenosis 01/07/2019  . Hx of colonic polyps 12/22/2016  . Nocturnal headaches 01/26/2016  . Dysphagia 05/06/2015  . Hyperlipidemia 04/19/2015  . Chronic lumbar pain 04/19/2015  . Lumbar stenosis with neurogenic claudication 06/08/2014  . HTN (hypertension) 03/27/2014  . Incarcerated femoral hernia 09/11/2013  . Ileus, postoperative ([place]) 07/02/2013  . GERD (gastroesophageal reflux disease) 07/02/2013  . Hx of tobacco use, presenting hazards to health  07/02/2013  . Arthritis 07/02/2013  . SBO (small bowel obstruction) ([place]) 06/01/2013  . Dehydration 06/01/2013  . Gastritis 04/13/2013  . Abdominal pain, unspecified site 04/13/2013  . Osteoporosis 04/13/2013  . Personal history of colonic polyps 04/13/2013  . COPD (chronic obstructive pulmonary disease) ([place]) 04/13/2013    Past Surgical History:  Procedure Laterality Date  . ABDOMINAL HYSTERECTOMY    . ANTERIOR LAT LUMBAR FUSION N/A 06/08/2014   Procedure: LUMBAR 2-3 LUMBAR 3-4 LUMBAR 4-5 ANTEROLATERAL DECOMPRESSION/FUSION WITH PERCUTANEOUS PEDICLE SCREWS;  Surgeon: Kristeen Miss, MD;  Location: [place] [facility];  Service: Neurosurgery;  Laterality: N/A;  L2-3 L3-4 L4-5 ANTEROLATERAL DECOMPRESSION/FUSION WITH PERCUTANEOUS PEDICLE SCREWS  . ANTERIOR LAT LUMBAR FUSION Left 01/07/2019   Procedure: Lumbar one-two Anterolateral lumbar interbody fusion with lateral plate fixation;  Surgeon: Kristeen Miss, MD;  Location: [place];  Service: Neurosurgery;  Laterality: Left;  . BACK SURGERY    . BOWEL RESECTION N/A 06/20/2013   Procedure: SMALL BOWEL RESECTION;  Surgeon: Harl Bowie, MD;  Location: [place];  Service: General;  Laterality: N/A;  . COLON SURGERY    . COLONOSCOPY  10/25/2011   Procedure: COLONOSCOPY;  Surgeon: Rogene Houston, MD;  [facility];  Service: Endoscopy;  Laterality: N/A;  930  . COLONOSCOPY N/A 05/09/2017   Procedure: COLONOSCOPY;  Surgeon: Rogene Houston, MD;  [facility];  Service: Endoscopy;  Laterality: N/A;  1030  . ESOPHAGEAL DILATION N/A 05/25/2015  Procedure: ESOPHAGEAL DILATION;  Surgeon: Rogene Houston, MD;  [facility];  Service: Endoscopy;  Laterality: N/A;  . ESOPHAGOGASTRODUODENOSCOPY  02/2009  . ESOPHAGOGASTRODUODENOSCOPY N/A 05/25/2015   Procedure: ESOPHAGOGASTRODUODENOSCOPY (EGD);  Surgeon: Rogene Houston, MD;  [facility];  Service: Endoscopy;  Laterality: N/A;  . HERNIA REPAIR Left    groin  . INGUINAL  HERNIA REPAIR Left 09/11/2013   Procedure: HERNIA REPAIR INGUINAL INCARCERATED;  Surgeon: Harl Bowie, MD;  Location: [place];  Service: General;  Laterality: Left;  . LAPAROTOMY N/A 06/20/2013   Procedure: EXPLORATORY LAPAROTOMY;  Surgeon: Harl Bowie, MD;  Location: [place];  Service: General;  Laterality: N/A;  . LUMBAR PERCUTANEOUS PEDICLE SCREW 3 LEVEL N/A 06/08/2014   Procedure: LUMBAR PERCUTANEOUS PEDICLE SCREW 3 LEVEL;  Surgeon: Kristeen Miss, MD;  Location: [place] [facility];  Service: Neurosurgery;  Laterality: N/A;  . POLYPECTOMY  05/09/2017   Procedure: POLYPECTOMY;  Surgeon: Rogene Houston, MD;  [facility];  Service: Endoscopy;;  sigmoid colon  . TONSILLECTOMY       OB History   No obstetric history on file.     Family History  Problem Relation Age of Onset  . Hypertension Mother   . Heart disease Father   . Diabetes Sister   . Heart disease Sister   . Diabetes Brother   . Heart disease Brother   . Anesthesia problems Neg Hx   . Hypotension Neg Hx   . Malignant hyperthermia Neg Hx   . Pseudochol deficiency Neg Hx     Social History   Tobacco Use  . Smoking status: Former    Packs/day: 0.25    Years: 0.00    Pack years: 0.00    Types: Cigarettes    Quit date: 10/18/2011    Years since quitting: 9.4  . Smokeless tobacco: Never  Vaping Use  . Vaping Use: Never used  Substance Use Topics  . Alcohol use: No  . Drug use: No    Home Medications Prior to Admission medications   Medication Sig Start Date End Date Taking? Authorizing Provider  acetaminophen (TYLENOL) 500 MG tablet Take 1,000 mg by mouth every 6 (six) hours as needed for mild pain or headache.    Yes [provider]  alum & mag hydroxide-simeth (MAALOX/MYLANTA) 200-200-20 MG/5ML suspension Take 15 mLs by mouth every 6 (six) hours as needed for indigestion or heartburn.   Yes [provider]  amLODipine (NORVASC) 5 MG tablet Take 1 tablet (5 mg total) by mouth  daily. 12/06/20  Yes Kathyrn Drown, MD  cyanocobalamin 1000 MCG tablet Take 1,000 mcg by mouth daily.   Yes [provider]  diphenhydrAMINE (BENADRYL) 25 MG tablet Take 25 mg by mouth every 6 (six) hours as needed.   Yes [provider]  oxyCODONE-acetaminophen (PERCOCET/ROXICET) 5-325 MG tablet 1 qid prn 12/06/20  Yes Luking, Scott A, MD  pantoprazole (PROTONIX) 40 MG tablet Take 1 tablet (40 mg total) by mouth daily. 12/06/20  Yes Kathyrn Drown, MD  zoledronic acid (RECLAST) 5 MG/100ML SOLN injection Inject 5 mg into the vein once. 05/15/18  Yes [provider]  oxyCODONE-acetaminophen (PERCOCET/ROXICET) 5-325 MG tablet One qid prn Patient not taking: Reported on 03/28/2021 12/06/20   Kathyrn Drown, MD  oxyCODONE-acetaminophen (PERCOCET/ROXICET) 5-325 MG tablet 1 qid prn Patient not taking: Reported on 03/28/2021 03/06/21   Kathyrn Drown, MD    Allergies    Codeine and Fosamax [  alendronate sodium]  Review of Systems   Review of Systems  Gastrointestinal:  Positive for abdominal pain.  All other systems reviewed and are negative.  Physical Exam Updated Vital Signs BP 129/67   Pulse 66   Temp 97.8 F (36.6 C) (Oral)   Resp 18   LMP  (LMP Unknown)   SpO2 97%   Physical Exam Vitals and nursing note reviewed.  Constitutional:      Appearance: She is well-developed.  HENT:     Head: Normocephalic.  Cardiovascular:     Rate and Rhythm: Normal rate.  Pulmonary:     Effort: Pulmonary effort is normal.  Abdominal:     General: Bowel sounds are normal. There is distension.     Palpations: Abdomen is soft.     Tenderness: There is abdominal tenderness.     Hernia: No hernia is present.  Musculoskeletal:        General: Normal range of motion.     Cervical back: Normal range of motion.  Skin:    General: Skin is warm.  Neurological:     Mental Status: She is alert and oriented to person, place, and time.    ED Results / Procedures / Treatments    Labs (all labs ordered are listed, but only abnormal results are displayed) Labs Reviewed  COMPREHENSIVE METABOLIC PANEL - Abnormal; Notable for the following components:      Result Value   Chloride 95 (*)    Glucose, Bld 130 (*)    Total Protein 8.9 (*)    All other components within normal limits  CBC - Abnormal; Notable for the following components:   WBC 16.3 (*)    Hemoglobin 15.4 (*)    HCT 46.4 (*)    All other components within normal limits  LIPASE, BLOOD  URINALYSIS, ROUTINE W REFLEX MICROSCOPIC    EKG EKG Interpretation  Date/Time:  Monday March 28 2021 13:30:56 EDT Ventricular Rate:  97 PR Interval:  152 QRS Duration: 80 QT Interval:  340 QTC Calculation: 431 R Axis:   36 Text Interpretation: Sinus rhythm with Premature supraventricular complexes Septal infarct , age undetermined Abnormal ECG Confirmed by Nanda Quinton [phone]) on 03/28/2021 1:58:33 PM  Radiology CT ABDOMEN PELVIS W CONTRAST  Result Date: 03/28/2021 CLINICAL DATA:  Nonlocalized acute abdominal pain. States she had covid the beginning of the month has had general abdominal pain/bloating ever since. Hx of uterine cancer, HTN, SBO, hysterectomy, bowel resection, hernia repair. EXAM: CT ABDOMEN AND PELVIS WITH CONTRAST TECHNIQUE: Multidetector CT imaging of the abdomen and pelvis was performed using the standard protocol following bolus administration of intravenous contrast. CONTRAST:  24mL OMNIPAQUE IOHEXOL 300 MG/ML  SOLN COMPARISON:  CT abdomen pelvis 12/28/2014. FINDINGS: Lower chest: Patchy airspace opacities of the lung bases. Centrilobular emphysematous changes. Hepatobiliary: No focal liver abnormality. No gallstones, gallbladder wall thickening, or pericholecystic fluid. No biliary dilatation. Pancreas: No focal lesion. Normal pancreatic contour. No surrounding inflammatory changes. No main pancreatic ductal dilatation. Spleen: Normal in size without focal abnormality. Adrenals/Urinary Tract:  No adrenal nodule bilaterally. Bilateral kidneys enhance symmetrically. Subcentimeter hypodensities are too small to characterize. No hydronephrosis. No hydroureter. The urinary bladder is unremarkable. On delayed imaging, there is no urothelial wall thickening and there are no filling defects in the opacified portions of the bilateral collecting systems or ureters. Stomach/Bowel: Fluid dilatation of the small bowel with a transition point in the right upper abdomen the level of a small bowel resection anastomosis. Stomach is within normal  limits. No evidence of bowel wall thickening or dilatation. The distal bowel and majority of the colon are decompressed. The appendix is not definitely identified. Vascular/Lymphatic: No abdominal aorta or iliac aneurysm. Severe atherosclerotic plaque of the aorta and its branches. No abdominal, pelvic, or inguinal lymphadenopathy. Reproductive: Status post hysterectomy. No adnexal masses. Other: No intraperitoneal free fluid. No intraperitoneal free gas. No organized fluid collection. Musculoskeletal: No abdominal wall hernia or abnormality. No suspicious lytic or blastic osseous lesions. No acute displaced fracture. Multilevel degenerative changes of the spine in a patient with L1 through L5 posterior and interbody fusion. IMPRESSION: 1. Small-bowel obstruction with transition point in the right upper abdomen at the level of the small bowel resection anastomosis. No associated bowel obstruction or findings to suggest bowel ischemia. 2. Patchy airspace opacities of the lung bases which could represent infection/inflammation. Recommend chest x-ray for further evaluation. 3.  Aortic Atherosclerosis (ICD10-I70.0) - severe 4.  Emphysema (ICD10-J43.9). Electronically Signed   By: Iven Finn M.D.   On: 03/28/2021 16:08    Procedures Procedures   Medications Ordered in ED Medications  iohexol (OMNIPAQUE) 9 MG/ML oral solution (has no administration in time range)   HYDROmorphone (DILAUDID) injection 1 mg (has no administration in time range)  HYDROmorphone (DILAUDID) injection 1 mg (1 mg Intravenous Given 03/28/21 1440)  ondansetron (ZOFRAN) injection 4 mg (4 mg Intravenous Given 03/28/21 1438)  iohexol (OMNIPAQUE) 300 MG/ML solution 75 mL (75 mLs Intravenous Contrast Given 03/28/21 1540)    ED Course  I have reviewed the triage vital signs and the nursing notes.  Pertinent labs & imaging results that were available during my care of the patient were reviewed by me and considered in my medical decision making (see chart for details).    MDM Rules/Calculators/A&P                           MDM:  Pt given Iv dilaudid and zofran   I discussed with Dr. Wynonia Sours General surgeon.  He will see, medicine admission,  NG tube ordered  Final Clinical Impression(s) / ED Diagnoses Final diagnoses:  None    Rx / DC Orders ED Discharge Orders     None        Sidney Ace 03/28/21 1843    Milton Ferguson, MD 04/01/21 [phone]

## 2021-03-29 ENCOUNTER — Other Ambulatory Visit: Payer: Self-pay

## 2021-03-29 ENCOUNTER — Observation Stay (HOSPITAL_COMMUNITY): Payer: PPO

## 2021-03-29 ENCOUNTER — Encounter (HOSPITAL_COMMUNITY): Payer: Self-pay | Admitting: Internal Medicine

## 2021-03-29 DIAGNOSIS — K5651 Intestinal adhesions [bands], with partial obstruction: Secondary | ICD-10-CM | POA: Diagnosis present

## 2021-03-29 DIAGNOSIS — Z79899 Other long term (current) drug therapy: Secondary | ICD-10-CM | POA: Diagnosis not present

## 2021-03-29 DIAGNOSIS — Z981 Arthrodesis status: Secondary | ICD-10-CM | POA: Diagnosis not present

## 2021-03-29 DIAGNOSIS — K5939 Other megacolon: Secondary | ICD-10-CM | POA: Diagnosis not present

## 2021-03-29 DIAGNOSIS — Z8616 Personal history of COVID-19: Secondary | ICD-10-CM | POA: Diagnosis not present

## 2021-03-29 DIAGNOSIS — I10 Essential (primary) hypertension: Secondary | ICD-10-CM | POA: Diagnosis present

## 2021-03-29 DIAGNOSIS — E7800 Pure hypercholesterolemia, unspecified: Secondary | ICD-10-CM | POA: Diagnosis present

## 2021-03-29 DIAGNOSIS — Z8249 Family history of ischemic heart disease and other diseases of the circulatory system: Secondary | ICD-10-CM | POA: Diagnosis not present

## 2021-03-29 DIAGNOSIS — E785 Hyperlipidemia, unspecified: Secondary | ICD-10-CM | POA: Diagnosis present

## 2021-03-29 DIAGNOSIS — Z8542 Personal history of malignant neoplasm of other parts of uterus: Secondary | ICD-10-CM | POA: Diagnosis not present

## 2021-03-29 DIAGNOSIS — Z23 Encounter for immunization: Secondary | ICD-10-CM | POA: Diagnosis present

## 2021-03-29 DIAGNOSIS — R54 Age-related physical debility: Secondary | ICD-10-CM | POA: Diagnosis present

## 2021-03-29 DIAGNOSIS — Z87891 Personal history of nicotine dependence: Secondary | ICD-10-CM | POA: Diagnosis not present

## 2021-03-29 DIAGNOSIS — Z833 Family history of diabetes mellitus: Secondary | ICD-10-CM | POA: Diagnosis not present

## 2021-03-29 DIAGNOSIS — Z4682 Encounter for fitting and adjustment of non-vascular catheter: Secondary | ICD-10-CM | POA: Diagnosis not present

## 2021-03-29 DIAGNOSIS — K5900 Constipation, unspecified: Secondary | ICD-10-CM | POA: Diagnosis not present

## 2021-03-29 DIAGNOSIS — K56609 Unspecified intestinal obstruction, unspecified as to partial versus complete obstruction: Secondary | ICD-10-CM | POA: Diagnosis present

## 2021-03-29 DIAGNOSIS — K6389 Other specified diseases of intestine: Secondary | ICD-10-CM | POA: Diagnosis not present

## 2021-03-29 DIAGNOSIS — Z888 Allergy status to other drugs, medicaments and biological substances status: Secondary | ICD-10-CM | POA: Diagnosis not present

## 2021-03-29 DIAGNOSIS — R0902 Hypoxemia: Secondary | ICD-10-CM | POA: Diagnosis not present

## 2021-03-29 DIAGNOSIS — K219 Gastro-esophageal reflux disease without esophagitis: Secondary | ICD-10-CM | POA: Diagnosis present

## 2021-03-29 DIAGNOSIS — J449 Chronic obstructive pulmonary disease, unspecified: Secondary | ICD-10-CM | POA: Diagnosis not present

## 2021-03-29 DIAGNOSIS — R918 Other nonspecific abnormal finding of lung field: Secondary | ICD-10-CM | POA: Diagnosis not present

## 2021-03-29 DIAGNOSIS — E876 Hypokalemia: Secondary | ICD-10-CM | POA: Diagnosis not present

## 2021-03-29 DIAGNOSIS — K439 Ventral hernia without obstruction or gangrene: Secondary | ICD-10-CM | POA: Diagnosis not present

## 2021-03-29 DIAGNOSIS — Z885 Allergy status to narcotic agent status: Secondary | ICD-10-CM | POA: Diagnosis not present

## 2021-03-29 LAB — BASIC METABOLIC PANEL
Anion gap: 9 (ref 5–15)
BUN: 18 mg/dL (ref 8–23)
CO2: 28 mmol/L (ref 22–32)
Calcium: 9.1 mg/dL (ref 8.9–10.3)
Chloride: 99 mmol/L (ref 98–111)
Creatinine, Ser: 0.82 mg/dL (ref 0.44–1.00)
GFR, Estimated: >60 mL/min (ref >60)
Glucose, Bld: 114 mg/dL — ABNORMAL HIGH (ref 70–99)
Potassium: 3.7 mmol/L (ref 3.5–5.1)
Sodium: 136 mmol/L (ref 135–145)

## 2021-03-29 LAB — CBC
HCT: 44 % (ref 36.0–46.0)
Hemoglobin: 14.2 g/dL (ref 12.0–15.0)
MCH: 31.9 pg (ref 26.0–34.0)
MCHC: 32.3 g/dL (ref 30.0–36.0)
MCV: 98.9 fL (ref 80.0–100.0)
Platelets: 311 10*3/uL (ref 150–400)
RBC: 4.45 MIL/uL (ref 3.87–5.11)
RDW: 12.9 % (ref 11.5–15.5)
WBC: 12.1 10*3/uL — ABNORMAL HIGH (ref 4.0–10.5)
nRBC: 0 % (ref 0.0–0.2)

## 2021-03-29 LAB — LACTIC ACID, PLASMA
Lactic Acid, Venous: 1.2 mmol/L (ref 0.5–1.9)
Lactic Acid, Venous: 1.6 mmol/L (ref 0.5–1.9)

## 2021-03-29 MED ORDER — MENTHOL 3 MG MT LOZG: 1.0000 | LOZENGE | OROMUCOSAL | Status: DC | PRN

## 2021-03-29 MED ORDER — MORPHINE SULFATE (PF) 4 MG/ML IV SOLN
4.0000 mg | Freq: Once | INTRAVENOUS | Status: AC
  Administered 2021-03-29: 4 mg via INTRAVENOUS
  Filled 2021-03-29: qty 1

## 2021-03-29 MED ORDER — FENTANYL CITRATE PF 50 MCG/ML IJ SOSY
50.0000 ug | PREFILLED_SYRINGE | Freq: Once | INTRAMUSCULAR | Status: AC
  Administered 2021-03-29: 50 ug via INTRAVENOUS
  Filled 2021-03-29: qty 1

## 2021-03-29 MED ORDER — MORPHINE SULFATE (PF) 4 MG/ML IV SOLN
4.0000 mg | INTRAVENOUS | Status: DC | PRN
Start: 2021-03-29 — End: 2021-04-03
  Administered 2021-03-29 – 2021-04-03 (×33): 4 mg via INTRAVENOUS
  Filled 2021-03-29 (×34): qty 1

## 2021-03-29 MED ORDER — MORPHINE SULFATE (PF) 2 MG/ML IV SOLN
2.0000 mg | INTRAVENOUS | Status: DC | PRN
  Administered 2021-03-29 (×2): 2 mg via INTRAVENOUS
  Filled 2021-03-29 (×2): qty 1

## 2021-03-29 NOTE — Progress Notes (Signed)
PROGRESS NOTE   Terri Harris  WVP:[id number]    DOB: 02/27/1941    DOA: 03/28/2021  PCP: Kathyrn Drown, MD   I have briefly reviewed patients previous medical records in [place].  Chief Complaint  Patient presents with   Abdominal Pain    Brief Narrative:  80 year old married female with medical history significant for COPD not on home oxygen, SBO, hypertension, GERD, former tobacco use, HLD, abdominal hysterectomy, small bowel resection in 2015, recent COVID-19 infection about 4 weeks prior to admission, presented to the [facility] ED on 03/28/2021 with complaints of abdominal pain.  Last BM 3 days PTA.  Admitted for small bowel obstruction.  General surgery consulted, currently managing conservatively.   Assessment & Plan:  Principal Problem:   SBO (small bowel obstruction) (HCC) Active Problems:   COPD (chronic obstructive pulmonary disease) (HCC)   HTN (hypertension)   COVID-19 virus infection   Small bowel obstruction - CT A/P 10/31: SBO with transition point in the right upper abdomen at the level of the small bowel resection anastomosis.  No findings to suggest bowel ischemia. - Prior history of multiple abdominal surgeries.  Thereby concern for mechanical SBO related to adhesions remains. - General surgery input appreciated.  Discussed with Dr. Arnoldo Morale. - For now managing conservatively with bowel rest/n.p.o., NG tube decompression with good effect, IV fluids, pain management and serial evaluations. - Obviously if this does not improve or worsens, she will need exploratory laparotomy.  Recent COVID-19 infection - Tested positive on 03/03/2021.  Reportedly completed 5-day course of an antiviral medication.  Reportedly has also received 3 doses of the Moderna vaccine. - CT abdomen shows patchy airspace opacities in the lung bases which could represent infection or inflammation.  These may be residual findings from earlier COVID-19 infection. - Patient  lacks any acute respiratory symptoms apart from being hypoxic.  We will check inflammatory markers.  Remains on airborne/contact isolation although not sure if this is indicated given that she is more than 21 days since positive status.  Hypoxia - May be due to recent COVID-19 infection. - No concern for current acute infection. - Supportive management with oxygen, incentive spirometry and wean as tolerated. - We will need evaluation for home oxygen prior to discharge.  We will also need to repeat chest x-ray in 4 to 6 weeks.  Leukocytosis - Likely stress margination. - Improved.  Follow CBC in AM.  COPD - No clinical bronchospasm. - Former smoker and quit 15 to 20 years ago.  Essential hypertension -Labile blood pressures but not markedly elevated.   -No oral meds/amlodipine due to n.p.o. and NG tube. - Continue as needed IV labetalol.  GERD - IV PPI.  Body mass index is 19.11 kg/m.    DVT prophylaxis: heparin injection 5,000 Units Start: 03/28/21 2200     Code Status: Full Code Family Communication: Discussed in detail with patient spouse at bedside, updated care and answered all questions. Disposition:  Status is: Inpatient  Remains inpatient appropriate because: Ongoing SBO that requires intense inpatient close evaluation and monitoring.  Currently remains n.p.o., has NG tube, IV fluids.        Consultants:   General surgery.  Procedures:   NG tube 10/31.  Antimicrobials:    Anti-infectives (From admission, onward)    None         Subjective:  Seen this morning in ED.  Spouse at bedside.  Patient reports that since her COVID-19 infection early October, has  had intermittent abdominal distention and pain, in the mornings and in the evenings, relieved after having diarrheal stools.  However over the last couple days, progressively worsening abdominal pain.  States that she has not had a "real BM" in 1.5 weeks but had a small BM in the last 2 to 3 days  before admission.  No flatus.  Since NG tube, feeling much better.  Mild intermittent headache.  Objective:   Vitals:   03/29/21 0700 03/29/21 0730 03/29/21 0855 03/29/21 0912  BP: (!) 150/75 (!) 144/74 (!) 145/73 (!) 145/73  Pulse: 72 70 71 71  Resp: 17 19 18 18   Temp:  97.9 F (36.6 C) 98.5 F (36.9 C) 98.5 F (36.9 C)  TempSrc:   Oral Oral  SpO2: 96% 98% 93%   Weight:    52.1 kg  Height:    5\' 5"  (1.651 m)    General exam: Elderly female, moderately built and frail, lying comfortably propped up in bed.  NG tube in place.  Canister full with brown color liquid, not coffee-ground looking or bloody.  Oral mucosa with borderline hydration. Respiratory system: Clear to auscultation. Respiratory effort normal. Cardiovascular system: S1 & S2 heard, RRR. No JVD, murmurs, rubs, gallops or clicks. No pedal edema. Gastrointestinal system: Midline extensive laparotomy scar from symphysis pubis up to lower epigastric region.  Nondistended.  Diffuse mild tenderness without rigidity, guarding or rebound.  Few bowel sounds heard. No organomegaly or masses felt.  Central nervous system: Alert and oriented. No focal neurological deficits. Extremities: Symmetric 5 x 5 power. Skin: No rashes, lesions or ulcers Psychiatry: Judgement and insight appear normal. Mood & affect appropriate.     Data Reviewed:   I have personally reviewed following labs and imaging studies   CBC: Recent Labs  Lab 03/28/21 1338 03/29/21 0404  WBC 16.3* 12.1*  HGB 15.4* 14.2  HCT 46.4* 44.0  MCV 98.9 98.9  PLT 335 867    Basic Metabolic Panel: Recent Labs  Lab 03/28/21 1338 03/29/21 0404  NA 137 136  K 4.0 3.7  CL 95* 99  CO2 28 28  GLUCOSE 130* 114*  BUN 14 18  CREATININE 0.94 0.82  CALCIUM 10.0 9.1    Liver Function Tests: Recent Labs  Lab 03/28/21 1338  AST 28  ALT 15  ALKPHOS 97  BILITOT 0.5  PROT 8.9*  ALBUMIN 4.8    CBG: No results for input(s): GLUCAP in the last 168  hours.  Microbiology Studies:   Recent Results (from the past 240 hour(s))  Resp Panel by RT-PCR (Flu A&B, Covid) Nasopharyngeal Swab     Status: Abnormal   Collection Time: 03/28/21  5:12 PM   Specimen: Nasopharyngeal Swab; Nasopharyngeal(NP) swabs in vial transport medium  Result Value Ref Range Status   SARS Coronavirus 2 by RT PCR POSITIVE (A) NEGATIVE Final    Comment: RESULT CALLED TO, READ BACK BY AND VERIFIED WITH: B OAKLEY RN [email or web address] [id number] K FORSYTH (NOTE) SARS-CoV-2 target nucleic acids are DETECTED.  The SARS-CoV-2 RNA is generally detectable in upper respiratory specimens during the acute phase of infection. Positive results are indicative of the presence of the identified virus, but do not rule out bacterial infection or co-infection with other pathogens not detected by the test. Clinical correlation with patient history and other diagnostic information is necessary to determine patient infection status. The expected result is Negative.  Fact Sheet for Patients: EntrepreneurPulse.com.au  Fact Sheet for Healthcare Providers: IncredibleEmployment.be  This test is not yet  approved or cleared by the [place] and  has been authorized for detection and/or diagnosis of SARS-CoV-2 by FDA under an Emergency Use Authorization (EUA).  This EUA will remain in effect (meaning this test can be  used) for the duration of  the COVID-19 declaration under Section 564(b)(1) of the Act, 21 U.S.C. section 360bbb-3(b)(1), unless the authorization is terminated or revoked sooner.     Influenza A by PCR NEGATIVE NEGATIVE Final   Influenza B by PCR NEGATIVE NEGATIVE Final    Comment: (NOTE) The Xpert Xpress SARS-CoV-2/FLU/RSV plus assay is intended as an aid in the diagnosis of influenza from Nasopharyngeal swab specimens and should not be used as a sole basis for treatment. Nasal washings and aspirates are unacceptable for Xpert Xpress  SARS-CoV-2/FLU/RSV testing.  Fact Sheet for Patients: EntrepreneurPulse.com.au  Fact Sheet for Healthcare Providers: IncredibleEmployment.be  This test is not yet approved or cleared by the [place] FDA and has been authorized for detection and/or diagnosis of SARS-CoV-2 by FDA under an Emergency Use Authorization (EUA). This EUA will remain in effect (meaning this test can be used) for the duration of the COVID-19 declaration under Section 564(b)(1) of the Act, 21 U.S.C. section 360bbb-3(b)(1), unless the authorization is terminated or revoked.  Performed at [facility], [address] [place], [place] [address]      Radiology Studies:  CT ABDOMEN PELVIS W CONTRAST  Result Date: 03/28/2021 CLINICAL DATA:  Nonlocalized acute abdominal pain. States she had covid the beginning of the month has had general abdominal pain/bloating ever since. Hx of uterine cancer, HTN, SBO, hysterectomy, bowel resection, hernia repair. EXAM: CT ABDOMEN AND PELVIS WITH CONTRAST TECHNIQUE: Multidetector CT imaging of the abdomen and pelvis was performed using the standard protocol following bolus administration of intravenous contrast. CONTRAST:  86mL OMNIPAQUE IOHEXOL 300 MG/ML  SOLN COMPARISON:  CT abdomen pelvis 12/28/2014. FINDINGS: Lower chest: Patchy airspace opacities of the lung bases. Centrilobular emphysematous changes. Hepatobiliary: No focal liver abnormality. No gallstones, gallbladder wall thickening, or pericholecystic fluid. No biliary dilatation. Pancreas: No focal lesion. Normal pancreatic contour. No surrounding inflammatory changes. No main pancreatic ductal dilatation. Spleen: Normal in size without focal abnormality. Adrenals/Urinary Tract: No adrenal nodule bilaterally. Bilateral kidneys enhance symmetrically. Subcentimeter hypodensities are too small to characterize. No hydronephrosis. No hydroureter. The urinary bladder is unremarkable. On delayed  imaging, there is no urothelial wall thickening and there are no filling defects in the opacified portions of the bilateral collecting systems or ureters. Stomach/Bowel: Fluid dilatation of the small bowel with a transition point in the right upper abdomen the level of a small bowel resection anastomosis. Stomach is within normal limits. No evidence of bowel wall thickening or dilatation. The distal bowel and majority of the colon are decompressed. The appendix is not definitely identified. Vascular/Lymphatic: No abdominal aorta or iliac aneurysm. Severe atherosclerotic plaque of the aorta and its branches. No abdominal, pelvic, or inguinal lymphadenopathy. Reproductive: Status post hysterectomy. No adnexal masses. Other: No intraperitoneal free fluid. No intraperitoneal free gas. No organized fluid collection. Musculoskeletal: No abdominal wall hernia or abnormality. No suspicious lytic or blastic osseous lesions. No acute displaced fracture. Multilevel degenerative changes of the spine in a patient with L1 through L5 posterior and interbody fusion. IMPRESSION: 1. Small-bowel obstruction with transition point in the right upper abdomen at the level of the small bowel resection anastomosis. No associated bowel obstruction or findings to suggest bowel ischemia. 2. Patchy airspace opacities of the lung bases which could represent infection/inflammation.  Recommend chest x-ray for further evaluation. 3.  Aortic Atherosclerosis (ICD10-I70.0) - severe 4.  Emphysema (ICD10-J43.9). Electronically Signed   By: Iven Finn M.D.   On: 03/28/2021 16:08   DG Chest Portable 1 View  Result Date: 03/29/2021 CLINICAL DATA:  Nasogastric tube placement EXAM: PORTABLE CHEST 1 VIEW COMPARISON:  03/28/2021 FINDINGS: Nasogastric tube extends into the left upper quadrant of the abdomen beyond the margin of the examination. The proximal side hole of the catheter overlies the gastric fundus, unchanged from prior examination. Lungs  appear mildly hyperinflated. Patchy infiltrate noted within the left lung base, possibly infectious or inflammatory in nature. The abdomen is largely excluded from view. IMPRESSION: Nasogastric tube appears unchanged extending into the left upper quadrant of the abdomen. Electronically Signed   By: Fidela Salisbury M.D.   On: 03/29/2021 01:15   DG Chest Portable 1 View  Result Date: 03/28/2021 CLINICAL DATA:  Nasogastric tube placement EXAM: PORTABLE CHEST 1 VIEW COMPARISON:  None. FINDINGS: Nasogastric tube tip overlies the expected proximal body of the stomach within the left upper quadrant of the abdomen. Lungs appear mildly hyperinflated. Patchy left basilar infiltrate is present, possibly infectious or inflammatory in nature. Multiple dilated gas-filled loops of small bowel are seen within the left upper quadrant of the abdomen in keeping with the known small-bowel obstruction seen on CT examination performed at 3:39 p.m. IMPRESSION: Nasogastric tube tip within the left upper quadrant of the abdomen overlying the proximal body of the stomach. Electronically Signed   By: Fidela Salisbury M.D.   On: 03/28/2021 19:19     Scheduled Meds:    heparin  5,000 Units Subcutaneous Q8H   pantoprazole (PROTONIX) IV  40 mg Intravenous Q24H    Continuous Infusions:    dextrose 5 % and 0.9% NaCl 75 mL/hr at 03/29/21 0504     LOS: 0 days     Vernell Leep, MD, [place], [facility]. [facility]    To contact the attending provider between 7A-7P or the covering provider during after hours 7P-7A, please log into the web site [email or web address] and access using universal [place] password for that web site. If you do not have the password, please call the hospital operator.  03/29/2021, 10:58 AM

## 2021-03-29 NOTE — ED Notes (Signed)
Pt noted to have oxygen sat level 80% on room air. Placed on 2L [place]. Improved to 92%

## 2021-03-29 NOTE — Consult Note (Signed)
Reason for Consult: Small bowel obstruction Referring Physician: Dr. Richardo Priest is an 80 y.o. female.  HPI: Patient is a 80 year old white female with a 4-week history of worsening intermittent abdominal distention and constipation.  She states this started soon after contracting COVID.  With she develops abdominal distention and then has an episode of diarrhea with resolution of her abdominal distention nausea.  She states now that she has not had a bowel movement in several days.  She denies passing flatus.  She does have nausea and abdominal distention.  A CT scan of the abdomen was performed which revealed a small bowel obstruction in the area of a previous anastomosis along the right side of the abdomen.  There is decompression of the colon present.  An NG tube was placed and 600 cc was extracted.  Patient states she currently does not have much abdominal pain when the NG tube was put back on to wall suction.  Past Medical History:  Diagnosis Date   ANA positive 01/2001   Arthritis    Arthritis 07/02/2013   ANA positive   Cancer (HCC)    partial hysterectomy age 58-uterine cancer   GERD (gastroesophageal reflux disease)    Hx of tobacco use, presenting hazards to health 07/02/2013   Hypercholesteremia    Hypertension    Ileus, postoperative ([place]) 07/02/2013   Osteoporosis    Pneumonia    hx of   SBO (small bowel obstruction) ([place]) 06/20/2103    Past Surgical History:  Procedure Laterality Date   ABDOMINAL HYSTERECTOMY     ANTERIOR LAT LUMBAR FUSION N/A 06/08/2014   Procedure: LUMBAR 2-3 LUMBAR 3-4 LUMBAR 4-5 ANTEROLATERAL DECOMPRESSION/FUSION WITH PERCUTANEOUS PEDICLE SCREWS;  Surgeon: Kristeen Miss, MD;  [facility];  Service: Neurosurgery;  Laterality: N/A;  L2-3 L3-4 L4-5 ANTEROLATERAL DECOMPRESSION/FUSION WITH PERCUTANEOUS PEDICLE SCREWS   ANTERIOR LAT LUMBAR FUSION Left 01/07/2019   Procedure: Lumbar one-two Anterolateral lumbar interbody fusion with lateral  plate fixation;  Surgeon: Kristeen Miss, MD;  Location: [place];  Service: Neurosurgery;  Laterality: Left;   BACK SURGERY     BOWEL RESECTION N/A 06/20/2013   Procedure: SMALL BOWEL RESECTION;  Surgeon: Harl Bowie, MD;  Location: [facility] [place];  Service: General;  Laterality: N/A;   COLON SURGERY     COLONOSCOPY  10/25/2011   Procedure: COLONOSCOPY;  Surgeon: Rogene Houston, MD;  [facility];  Service: Endoscopy;  Laterality: N/A;  930   COLONOSCOPY N/A 05/09/2017   Procedure: COLONOSCOPY;  Surgeon: Rogene Houston, MD;  [facility];  Service: Endoscopy;  Laterality: N/A;  1030   ESOPHAGEAL DILATION N/A 05/25/2015   Procedure: ESOPHAGEAL DILATION;  Surgeon: Rogene Houston, MD;  [facility];  Service: Endoscopy;  Laterality: N/A;   ESOPHAGOGASTRODUODENOSCOPY  02/2009   ESOPHAGOGASTRODUODENOSCOPY N/A 05/25/2015   Procedure: ESOPHAGOGASTRODUODENOSCOPY (EGD);  Surgeon: Rogene Houston, MD;  [facility];  Service: Endoscopy;  Laterality: N/A;   HERNIA REPAIR Left    groin   INGUINAL HERNIA REPAIR Left 09/11/2013   Procedure: HERNIA REPAIR INGUINAL INCARCERATED;  Surgeon: Harl Bowie, MD;  Location: [place];  Service: General;  Laterality: Left;   LAPAROTOMY N/A 06/20/2013   Procedure: EXPLORATORY LAPAROTOMY;  Surgeon: Harl Bowie, MD;  Location: [place];  Service: General;  Laterality: N/A;   LUMBAR PERCUTANEOUS PEDICLE SCREW 3 LEVEL N/A 06/08/2014   Procedure: LUMBAR PERCUTANEOUS PEDICLE SCREW 3 LEVEL;  Surgeon: Kristeen Miss, MD;  Location: [facility];  Service: Neurosurgery;  Laterality: N/A;   POLYPECTOMY  05/09/2017   Procedure: POLYPECTOMY;  Surgeon: Rogene Houston, MD;  [facility];  Service: Endoscopy;;  sigmoid colon   TONSILLECTOMY      Family History  Problem Relation Age of Onset   Hypertension Mother    Heart disease Father    Diabetes Sister    Heart disease Sister    Diabetes Brother    Heart disease  Brother    Anesthesia problems Neg Hx    Hypotension Neg Hx    Malignant hyperthermia Neg Hx    Pseudochol deficiency Neg Hx     Social History:  reports that she quit smoking about 9 years ago. Her smoking use included cigarettes. She smoked an average of 0.25 packs per day. She has never used smokeless tobacco. She reports that she does not drink alcohol and does not use drugs.  Allergies:  Allergies  Allergen Reactions   Codeine Nausea And Vomiting   Fosamax [Alendronate Sodium] Other (See Comments)    gastritis    Medications: I have reviewed the patient's current medications. Prior to Admission:  Medications Prior to Admission  Medication Sig Dispense Refill Last Dose   acetaminophen (TYLENOL) 500 MG tablet Take 1,000 mg by mouth every 6 (six) hours as needed for mild pain or headache.    03/28/2021   alum & mag hydroxide-simeth (MAALOX/MYLANTA) 200-200-20 MG/5ML suspension Take 15 mLs by mouth every 6 (six) hours as needed for indigestion or heartburn.   03/28/2021   amLODipine (NORVASC) 5 MG tablet Take 1 tablet (5 mg total) by mouth daily. 90 tablet 3 03/28/2021   cyanocobalamin 1000 MCG tablet Take 1,000 mcg by mouth daily.      diphenhydrAMINE (BENADRYL) 25 MG tablet Take 25 mg by mouth every 6 (six) hours as needed.   03/28/2021   oxyCODONE-acetaminophen (PERCOCET/ROXICET) 5-325 MG tablet 1 qid prn 120 tablet 0 03/28/2021   pantoprazole (PROTONIX) 40 MG tablet Take 1 tablet (40 mg total) by mouth daily. 90 tablet 3 03/28/2021   zoledronic acid (RECLAST) 5 MG/100ML SOLN injection Inject 5 mg into the vein once.   unknown   oxyCODONE-acetaminophen (PERCOCET/ROXICET) 5-325 MG tablet One qid prn (Patient not taking: Reported on 03/28/2021) 120 tablet 0 Not Taking   oxyCODONE-acetaminophen (PERCOCET/ROXICET) 5-325 MG tablet 1 qid prn (Patient not taking: Reported on 03/28/2021) 120 tablet 0 Not Taking    Results for orders placed or performed during the hospital encounter of  03/28/21 (from the past 48 hour(s))  Lipase, blood     Status: None   Collection Time: 03/28/21  1:38 PM  Result Value Ref Range   Lipase 31 11 - 51 U/L    Comment: Performed at [facility], [address] [place], [place] [address]  Comprehensive metabolic panel     Status: Abnormal   Collection Time: 03/28/21  1:38 PM  Result Value Ref Range   Sodium 137 135 - 145 mmol/L   Potassium 4.0 3.5 - 5.1 mmol/L   Chloride 95 (L) 98 - 111 mmol/L   CO2 28 22 - 32 mmol/L   Glucose, Bld 130 (H) 70 - 99 mg/dL    Comment: Glucose reference range applies only to samples taken after fasting for at least 8 hours.   BUN 14 8 - 23 mg/dL   Creatinine, Ser 0.94 0.44 - 1.00 mg/dL   Calcium 10.0 8.9 - 10.3 mg/dL   Total Protein 8.9 (H)  6.5 - 8.1 g/dL   Albumin 4.8 3.5 - 5.0 g/dL   AST 28 15 - 41 U/L   ALT 15 0 - 44 U/L   Alkaline Phosphatase 97 38 - 126 U/L   Total Bilirubin 0.5 0.3 - 1.2 mg/dL   GFR, Estimated >60 >60 mL/min    Comment: (NOTE) Calculated using the CKD-EPI Creatinine Equation (2021)    Anion gap 14 5 - 15    Comment: Performed at [facility], [address] [place], [place] [address]  CBC     Status: Abnormal   Collection Time: 03/28/21  1:38 PM  Result Value Ref Range   WBC 16.3 (H) 4.0 - 10.5 K/uL   RBC 4.69 3.87 - 5.11 MIL/uL   Hemoglobin 15.4 (H) 12.0 - 15.0 g/dL   HCT 46.4 (H) 36.0 - 46.0 %   MCV 98.9 80.0 - 100.0 fL   MCH 32.8 26.0 - 34.0 pg   MCHC 33.2 30.0 - 36.0 g/dL   RDW 12.8 11.5 - 15.5 %   Platelets 335 150 - 400 K/uL   nRBC 0.0 0.0 - 0.2 %    Comment: Performed at [facility], [address], [place], [place] [address]  Resp Panel by RT-PCR (Flu A&B, Covid) Nasopharyngeal Swab     Status: Abnormal   Collection Time: 03/28/21  5:12 PM   Specimen: Nasopharyngeal Swab; Nasopharyngeal(NP) swabs in vial transport medium  Result Value Ref Range   SARS Coronavirus 2 by RT PCR POSITIVE (A) NEGATIVE    Comment: RESULT CALLED TO, READ BACK BY AND VERIFIED WITH: B  OAKLEY RN [place].Ash [id number] K FORSYTH (NOTE) SARS-CoV-2 target nucleic acids are DETECTED.  The SARS-CoV-2 RNA is generally detectable in upper respiratory specimens during the acute phase of infection. Positive results are indicative of the presence of the identified virus, but do not rule out bacterial infection or co-infection with other pathogens not detected by the test. Clinical correlation with patient history and other diagnostic information is necessary to determine patient infection status. The expected result is Negative.  Fact Sheet for Patients: EntrepreneurPulse.com.au  Fact Sheet for Healthcare Providers: IncredibleEmployment.be  This test is not yet approved or cleared by the [place] FDA and  has been authorized for detection and/or diagnosis of SARS-CoV-2 by FDA under an Emergency Use Authorization (EUA).  This EUA will remain in effect (meaning this test can be  used) for the duration of  the COVID-19 declaration under Section 564(b)(1) of the Act, 21 U.S.C. section 360bbb-3(b)(1), unless the authorization is terminated or revoked sooner.     Influenza A by PCR NEGATIVE NEGATIVE   Influenza B by PCR NEGATIVE NEGATIVE    Comment: (NOTE) The Xpert Xpress SARS-CoV-2/FLU/RSV plus assay is intended as an aid in the diagnosis of influenza from Nasopharyngeal swab specimens and should not be used as a sole basis for treatment. Nasal washings and aspirates are unacceptable for Xpert Xpress SARS-CoV-2/FLU/RSV testing.  Fact Sheet for Patients: EntrepreneurPulse.com.au  Fact Sheet for Healthcare Providers: IncredibleEmployment.be  This test is not yet approved or cleared by the [place] FDA and has been authorized for detection and/or diagnosis of SARS-CoV-2 by FDA under an Emergency Use Authorization (EUA). This EUA will remain in effect (meaning this test can be used) for the duration of  the COVID-19 declaration under Section 564(b)(1) of the Act, 21 U.S.C. section 360bbb-3(b)(1), unless the authorization is terminated or revoked.  Performed at [facility], [address], [place], [place] [address]  Lactic acid, plasma     Status: None   Collection Time: 03/29/21  1:07 AM  Result Value Ref Range   Lactic Acid, Venous 1.2 0.5 - 1.9 mmol/L    Comment: Performed at [facility], [address], [place], [place] [address]  Basic metabolic panel     Status: Abnormal   Collection Time: 03/29/21  4:04 AM  Result Value Ref Range   Sodium 136 135 - 145 mmol/L   Potassium 3.7 3.5 - 5.1 mmol/L   Chloride 99 98 - 111 mmol/L   CO2 28 22 - 32 mmol/L   Glucose, Bld 114 (H) 70 - 99 mg/dL    Comment: Glucose reference range applies only to samples taken after fasting for at least 8 hours.   BUN 18 8 - 23 mg/dL   Creatinine, Ser 0.82 0.44 - 1.00 mg/dL   Calcium 9.1 8.9 - 10.3 mg/dL   GFR, Estimated >60 >60 mL/min    Comment: (NOTE) Calculated using the CKD-EPI Creatinine Equation (2021)    Anion gap 9 5 - 15    Comment: Performed at [facility], [address] [place], [place] [address]  CBC     Status: Abnormal   Collection Time: 03/29/21  4:04 AM  Result Value Ref Range   WBC 12.1 (H) 4.0 - 10.5 K/uL   RBC 4.45 3.87 - 5.11 MIL/uL   Hemoglobin 14.2 12.0 - 15.0 g/dL   HCT 44.0 36.0 - 46.0 %   MCV 98.9 80.0 - 100.0 fL   MCH 31.9 26.0 - 34.0 pg   MCHC 32.3 30.0 - 36.0 g/dL   RDW 12.9 11.5 - 15.5 %   Platelets 311 150 - 400 K/uL   nRBC 0.0 0.0 - 0.2 %    Comment: Performed at [facility], [address] [place], [place] [address]  Lactic acid, plasma     Status: None   Collection Time: 03/29/21  4:04 AM  Result Value Ref Range   Lactic Acid, Venous 1.6 0.5 - 1.9 mmol/L    Comment: Performed at [facility], [address], [place], [place] [address]    CT ABDOMEN PELVIS W CONTRAST  Result Date: 03/28/2021 CLINICAL DATA:  Nonlocalized acute abdominal pain. States  she had covid the beginning of the month has had general abdominal pain/bloating ever since. Hx of uterine cancer, HTN, SBO, hysterectomy, bowel resection, hernia repair. EXAM: CT ABDOMEN AND PELVIS WITH CONTRAST TECHNIQUE: Multidetector CT imaging of the abdomen and pelvis was performed using the standard protocol following bolus administration of intravenous contrast. CONTRAST:  5mL OMNIPAQUE IOHEXOL 300 MG/ML  SOLN COMPARISON:  CT abdomen pelvis 12/28/2014. FINDINGS: Lower chest: Patchy airspace opacities of the lung bases. Centrilobular emphysematous changes. Hepatobiliary: No focal liver abnormality. No gallstones, gallbladder wall thickening, or pericholecystic fluid. No biliary dilatation. Pancreas: No focal lesion. Normal pancreatic contour. No surrounding inflammatory changes. No main pancreatic ductal dilatation. Spleen: Normal in size without focal abnormality. Adrenals/Urinary Tract: No adrenal nodule bilaterally. Bilateral kidneys enhance symmetrically. Subcentimeter hypodensities are too small to characterize. No hydronephrosis. No hydroureter. The urinary bladder is unremarkable. On delayed imaging, there is no urothelial wall thickening and there are no filling defects in the opacified portions of the bilateral collecting systems or ureters. Stomach/Bowel: Fluid dilatation of the small bowel with a transition point in the right upper abdomen the level of a small bowel resection anastomosis. Stomach is within normal limits. No evidence of bowel wall thickening or dilatation. The distal bowel and majority  of the colon are decompressed. The appendix is not definitely identified. Vascular/Lymphatic: No abdominal aorta or iliac aneurysm. Severe atherosclerotic plaque of the aorta and its branches. No abdominal, pelvic, or inguinal lymphadenopathy. Reproductive: Status post hysterectomy. No adnexal masses. Other: No intraperitoneal free fluid. No intraperitoneal free gas. No organized fluid collection.  Musculoskeletal: No abdominal wall hernia or abnormality. No suspicious lytic or blastic osseous lesions. No acute displaced fracture. Multilevel degenerative changes of the spine in a patient with L1 through L5 posterior and interbody fusion. IMPRESSION: 1. Small-bowel obstruction with transition point in the right upper abdomen at the level of the small bowel resection anastomosis. No associated bowel obstruction or findings to suggest bowel ischemia. 2. Patchy airspace opacities of the lung bases which could represent infection/inflammation. Recommend chest x-ray for further evaluation. 3.  Aortic Atherosclerosis (ICD10-I70.0) - severe 4.  Emphysema (ICD10-J43.9). Electronically Signed   By: Iven Finn M.D.   On: 03/28/2021 16:08   DG Chest Portable 1 View  Result Date: 03/29/2021 CLINICAL DATA:  Nasogastric tube placement EXAM: PORTABLE CHEST 1 VIEW COMPARISON:  03/28/2021 FINDINGS: Nasogastric tube extends into the left upper quadrant of the abdomen beyond the margin of the examination. The proximal side hole of the catheter overlies the gastric fundus, unchanged from prior examination. Lungs appear mildly hyperinflated. Patchy infiltrate noted within the left lung base, possibly infectious or inflammatory in nature. The abdomen is largely excluded from view. IMPRESSION: Nasogastric tube appears unchanged extending into the left upper quadrant of the abdomen. Electronically Signed   By: Fidela Salisbury M.D.   On: 03/29/2021 01:15   DG Chest Portable 1 View  Result Date: 03/28/2021 CLINICAL DATA:  Nasogastric tube placement EXAM: PORTABLE CHEST 1 VIEW COMPARISON:  None. FINDINGS: Nasogastric tube tip overlies the expected proximal body of the stomach within the left upper quadrant of the abdomen. Lungs appear mildly hyperinflated. Patchy left basilar infiltrate is present, possibly infectious or inflammatory in nature. Multiple dilated gas-filled loops of small bowel are seen within the left upper  quadrant of the abdomen in keeping with the known small-bowel obstruction seen on CT examination performed at 3:39 p.m. IMPRESSION: Nasogastric tube tip within the left upper quadrant of the abdomen overlying the proximal body of the stomach. Electronically Signed   By: Fidela Salisbury M.D.   On: 03/28/2021 19:19    ROS:  Pertinent items are noted in HPI.  Blood pressure (!) 145/73, pulse 71, temperature 98.5 F (36.9 C), temperature source Oral, resp. rate 18, height 5\' 5"  (1.651 m), weight 52.1 kg, SpO2 93 %. Physical Exam: Thin white elderly female in no acute distress. Head is normocephalic, atraumatic Lungs are relatively clear to auscultation Heart examination reveals regular rate and rhythm without S3, S4, murmurs Abdomen is slightly distended with minimal bowel sounds appreciated.  No significant rigidity or distention is noted.  CT scan images personally reviewed Previous operative note from 2015 reviewed  Assessment/Plan: Impression: Small bowel obstruction.  At this point, it is difficult to a certain whether this is mechanical or physiologic. Plan: Would continue NG tube decompression.  I did discuss with the patient and husband the possible need for exploration should this not resolve her obstruction.  She understands.  We will follow with you.  Aviva Signs 03/29/2021, 9:47 AM

## 2021-03-29 NOTE — ED Notes (Signed)
Pt calling out due to intense abd pain. No output noted in suction container attached to NG. Hospitalist paged, pain meds given (see mar), tube flushed with water and noted to be suctioning again. Chest x-ray ordered to verify tube has not become displaced.

## 2021-03-29 NOTE — ED Notes (Signed)
Hospitalist notified of pt's continued pain.

## 2021-03-30 ENCOUNTER — Inpatient Hospital Stay (HOSPITAL_COMMUNITY): Payer: PPO

## 2021-03-30 LAB — BASIC METABOLIC PANEL
Anion gap: 6 (ref 5–15)
BUN: 13 mg/dL (ref 8–23)
CO2: 28 mmol/L (ref 22–32)
Calcium: 8.7 mg/dL — ABNORMAL LOW (ref 8.9–10.3)
Chloride: 107 mmol/L (ref 98–111)
Creatinine, Ser: 0.54 mg/dL (ref 0.44–1.00)
GFR, Estimated: >60 mL/min (ref >60)
Glucose, Bld: 99 mg/dL (ref 70–99)
Potassium: 3.3 mmol/L — ABNORMAL LOW (ref 3.5–5.1)
Sodium: 141 mmol/L (ref 135–145)

## 2021-03-30 LAB — CBC
HCT: 42.4 % (ref 36.0–46.0)
Hemoglobin: 13.3 g/dL (ref 12.0–15.0)
MCH: 32.2 pg (ref 26.0–34.0)
MCHC: 31.4 g/dL (ref 30.0–36.0)
MCV: 102.7 fL — ABNORMAL HIGH (ref 80.0–100.0)
Platelets: 248 10*3/uL (ref 150–400)
RBC: 4.13 MIL/uL (ref 3.87–5.11)
RDW: 13.2 % (ref 11.5–15.5)
WBC: 7.1 10*3/uL (ref 4.0–10.5)
nRBC: 0 % (ref 0.0–0.2)

## 2021-03-30 LAB — D-DIMER, QUANTITATIVE: D-Dimer, Quant: 0.96 ug/mL-FEU — ABNORMAL HIGH (ref 0.00–0.50)

## 2021-03-30 LAB — FERRITIN: Ferritin: 234 ng/mL (ref 11–307)

## 2021-03-30 LAB — MAGNESIUM
Magnesium: 2.4 mg/dL (ref 1.7–2.4)
Magnesium: 2.7 mg/dL — ABNORMAL HIGH (ref 1.7–2.4)

## 2021-03-30 LAB — C-REACTIVE PROTEIN: CRP: 3.1 mg/dL — ABNORMAL HIGH (ref <1.0)

## 2021-03-30 LAB — PHOSPHORUS: Phosphorus: 3.5 mg/dL (ref 2.5–4.6)

## 2021-03-30 MED ORDER — DIATRIZOATE MEGLUMINE & SODIUM 66-10 % PO SOLN
ORAL | Status: AC
  Filled 2021-03-30: qty 90

## 2021-03-30 MED ORDER — DIATRIZOATE MEGLUMINE & SODIUM 66-10 % PO SOLN
90.0000 mL | Freq: Once | ORAL | Status: AC
  Administered 2021-03-30: 90 mL via NASOGASTRIC
  Filled 2021-03-30: qty 90

## 2021-03-30 MED ORDER — LORAZEPAM 2 MG/ML IJ SOLN
1.0000 mg | Freq: Once | INTRAMUSCULAR | Status: AC
  Administered 2021-03-30: 1 mg via INTRAVENOUS
  Filled 2021-03-30: qty 1

## 2021-03-30 MED ORDER — POTASSIUM CHLORIDE 10 MEQ/100ML IV SOLN
10.0000 meq | INTRAVENOUS | Status: AC
  Administered 2021-03-30 (×4): 10 meq via INTRAVENOUS
  Filled 2021-03-30 (×4): qty 100

## 2021-03-30 NOTE — Progress Notes (Signed)
Initial Nutrition Assessment  DOCUMENTATION CODES:      INTERVENTION:  RD will continue to follow for diet advancement and monitor progression nutritionally   NUTRITION DIAGNOSIS:   Inadequate oral intake related to inability to eat (small bowel obstruction) as evidenced by NPO status.  GOAL:  Patient will meet greater than or equal to 90% of their needs   MONITOR:  Diet advancement, Supplement acceptance, Weight trends, Labs, I & O's  REASON FOR ASSESSMENT:   Malnutrition Screening Tool    ASSESSMENT: patient is a 80 yo female who has a recent hx of COVID, COPD and GERD.   Presents with abdominal pain and small bowel obstruction. NGT placed and output 600 ml.   Patient NPO currently. Review of weights show 52-55 kg the past 6 months. Weighed 57 kg in January this year. A loss of 8% for 10 months not significant clinically- but undesired.   At risk for malnutrition and further weight loss given her acute SBO and COVID illness.   Medications reviewed and include: Protonix  Labs: BMP Latest Ref Rng & Units 03/30/2021 03/29/2021 03/28/2021  Glucose 70 - 99 mg/dL 99 114(H) 130(H)  BUN 8 - 23 mg/dL 13 18 14   Creatinine 0.44 - 1.00 mg/dL 0.54 0.82 0.94  BUN/Creat Ratio 12 - 28 - - -  Sodium 135 - 145 mmol/L 141 136 137  Potassium 3.5 - 5.1 mmol/L 3.3(L) 3.7 4.0  Chloride 98 - 111 mmol/L 107 99 95(L)  CO2 22 - 32 mmol/L 28 28 28   Calcium 8.9 - 10.3 mg/dL 8.7(L) 9.1 10.0      NUTRITION - FOCUSED PHYSICAL EXAM: Unable to complete Nutrition-Focused physical exam at this time.     Diet Order:   Diet Order             Diet NPO time specified  Diet effective now                   EDUCATION NEEDS:  No education needs have been identified at this time  Skin:  Skin Assessment: Reviewed RN Assessment  Last BM:  10/29  Height:   Ht Readings from Last 1 Encounters:  03/29/21 5\' 5"  (1.651 m)    Weight:   Wt Readings from Last 1 Encounters:  03/29/21 52.1 kg     Ideal Body Weight:   57 kg  BMI:  Body mass index is 19.11 kg/m.  Estimated Nutritional Needs:   Kcal:  1600-1800  Protein:  78-83 gr  Fluid:  1600 ml daily   Colman Cater MS,RD,CSG,LDN Contact: Shea Evans

## 2021-03-30 NOTE — Progress Notes (Signed)
Subjective: Patient has not passed flatus or had a bowel movement yet.  She denies any abdominal pain.  Objective: Vital signs in last 24 hours: Temp:  [98 F (36.7 C)-98.3 F (36.8 C)] 98.3 F (36.8 C) (11/02 0410) Pulse Rate:  [67-68] 67 (11/02 0410) Resp:  [18-20] 18 (11/02 0410) BP: (120-155)/(58-74) 120/58 (11/02 0410) SpO2:  [91 %-97 %] 93 % (11/02 0410) Last BM Date:  (pt states unsure)  Intake/Output from previous day: 11/01 0701 - 11/02 0700 In: 1501.2 [P.O.:100; I.V.:1401.2] Out: 700 [Urine:100; Emesis/NG output:600] Intake/Output this shift: No intake/output data recorded.  General appearance: alert, cooperative, and no distress GI: Soft, flat, nontender, nondistended.  Occasional bowel sounds appreciated.  Lab Results:  Recent Labs    03/29/21 0404 03/30/21 0536  WBC 12.1* 7.1  HGB 14.2 13.3  HCT 44.0 42.4  PLT 311 248   BMET Recent Labs    03/29/21 0404 03/30/21 0536  NA 136 141  K 3.7 3.3*  CL 99 107  CO2 28 28  GLUCOSE 114* 99  BUN 18 13  CREATININE 0.82 0.54  CALCIUM 9.1 8.7*   PT/INR No results for input(s): LABPROT, INR in the last 72 hours.  Studies/Results: CT ABDOMEN PELVIS WO CONTRAST  Result Date: 03/29/2021 CLINICAL DATA:  Small-bowel obstruction with increased pain. EXAM: CT ABDOMEN AND PELVIS WITHOUT CONTRAST TECHNIQUE: Multidetector CT imaging of the abdomen and pelvis was performed following the standard protocol without IV contrast. COMPARISON:  CTs with contrast, yesterday at 3:41 p.m. and 12/28/2014 FINDINGS: Lower chest: Emphysematous and chronic scarring changes in the lung bases are present with scattered peripheral opacities consistent with mult lobar pneumonia. Hepatobiliary: No focal liver abnormality is seen. No gallstones, gallbladder wall thickening, or biliary dilatation. There is contrast in gallbladder and common bile duct. Pancreas: Unremarkable. No pancreatic ductal dilatation or surrounding inflammatory changes.  Spleen: Normal in size without focal abnormality. Adrenals/Urinary Tract: There is mild residual contrast enhancement in the bilateral kidneys. The should no longer be seen. Please correlate clinically for potential contrast nephrotoxicity. There is contrast in the collecting systems and in portions of the ureters, and in the unremarkable urinary bladder. No adrenal mass is seen. Stomach/Bowel: Interval NGT insertion with the tip in the body of stomach directed left. Generalized small bowel dilatation is similar to yesterday's study up to 4.2 cm in diameter and appears to transition at the level of a small bowel resection with reanastomosis in the right upper abdomen. The colon is decompressed. No evidence of colitis or diverticulitis. The appendix could not be seen. Vascular/Lymphatic: There is extensive aortoiliac calcified plaque without AAA. The abdominal aorta is tortuous. No abdominal or pelvic adenopathy seen. Reproductive: The uterus is absent.  No adnexal mass is seen. Other: There is a left flank wall hernia containing a nonobstructed short loop of the descending colon. There is minimal ascites in the upper abdomen, left paracolic gutter and trace pelvic ascites increased. Musculoskeletal: There are degenerative and postsurgical changes lumbar spine, with moderate metallic artifact and asymmetric left hip DJD. IMPRESSION: 1. Small-bowel obstruction with dilated small bowel up to 4.2 cm, and the transition again appears to be in the heavy upper abdomen at a site of small bowel resection and reanastomosis. 2. No bowel pneumatosis or free air are observed. There is increased minimal abdominal and pelvic ascites. 3. Persisting contrast enhancement bilateral renal cortex, should not be seen 11 hours after the prior contrast bolus. Please correlate clinically for patient dehydration or contrast nephrotoxicity. 4. Interval  enteric tube placement with tip in the body of stomach directed left. 5. Scattered  peripheral opacities in the lower lung fields in the setting of COPD, most likely reflecting multilobar pneumonia. 6. Extensive aortoiliac calcification and additional findings discussed above. Electronically Signed   By: Telford Nab M.D.   On: 03/29/2021 03:32   CT ABDOMEN PELVIS W CONTRAST  Result Date: 03/28/2021 CLINICAL DATA:  Nonlocalized acute abdominal pain. States she had covid the beginning of the month has had general abdominal pain/bloating ever since. Hx of uterine cancer, HTN, SBO, hysterectomy, bowel resection, hernia repair. EXAM: CT ABDOMEN AND PELVIS WITH CONTRAST TECHNIQUE: Multidetector CT imaging of the abdomen and pelvis was performed using the standard protocol following bolus administration of intravenous contrast. CONTRAST:  93mL OMNIPAQUE IOHEXOL 300 MG/ML  SOLN COMPARISON:  CT abdomen pelvis 12/28/2014. FINDINGS: Lower chest: Patchy airspace opacities of the lung bases. Centrilobular emphysematous changes. Hepatobiliary: No focal liver abnormality. No gallstones, gallbladder wall thickening, or pericholecystic fluid. No biliary dilatation. Pancreas: No focal lesion. Normal pancreatic contour. No surrounding inflammatory changes. No main pancreatic ductal dilatation. Spleen: Normal in size without focal abnormality. Adrenals/Urinary Tract: No adrenal nodule bilaterally. Bilateral kidneys enhance symmetrically. Subcentimeter hypodensities are too small to characterize. No hydronephrosis. No hydroureter. The urinary bladder is unremarkable. On delayed imaging, there is no urothelial wall thickening and there are no filling defects in the opacified portions of the bilateral collecting systems or ureters. Stomach/Bowel: Fluid dilatation of the small bowel with a transition point in the right upper abdomen the level of a small bowel resection anastomosis. Stomach is within normal limits. No evidence of bowel wall thickening or dilatation. The distal bowel and majority of the colon are  decompressed. The appendix is not definitely identified. Vascular/Lymphatic: No abdominal aorta or iliac aneurysm. Severe atherosclerotic plaque of the aorta and its branches. No abdominal, pelvic, or inguinal lymphadenopathy. Reproductive: Status post hysterectomy. No adnexal masses. Other: No intraperitoneal free fluid. No intraperitoneal free gas. No organized fluid collection. Musculoskeletal: No abdominal wall hernia or abnormality. No suspicious lytic or blastic osseous lesions. No acute displaced fracture. Multilevel degenerative changes of the spine in a patient with L1 through L5 posterior and interbody fusion. IMPRESSION: 1. Small-bowel obstruction with transition point in the right upper abdomen at the level of the small bowel resection anastomosis. No associated bowel obstruction or findings to suggest bowel ischemia. 2. Patchy airspace opacities of the lung bases which could represent infection/inflammation. Recommend chest x-ray for further evaluation. 3.  Aortic Atherosclerosis (ICD10-I70.0) - severe 4.  Emphysema (ICD10-J43.9). Electronically Signed   By: Iven Finn M.D.   On: 03/28/2021 16:08   DG Chest Portable 1 View  Result Date: 03/29/2021 CLINICAL DATA:  Nasogastric tube placement EXAM: PORTABLE CHEST 1 VIEW COMPARISON:  03/28/2021 FINDINGS: Nasogastric tube extends into the left upper quadrant of the abdomen beyond the margin of the examination. The proximal side hole of the catheter overlies the gastric fundus, unchanged from prior examination. Lungs appear mildly hyperinflated. Patchy infiltrate noted within the left lung base, possibly infectious or inflammatory in nature. The abdomen is largely excluded from view. IMPRESSION: Nasogastric tube appears unchanged extending into the left upper quadrant of the abdomen. Electronically Signed   By: Fidela Salisbury M.D.   On: 03/29/2021 01:15   DG Chest Portable 1 View  Result Date: 03/28/2021 CLINICAL DATA:  Nasogastric tube  placement EXAM: PORTABLE CHEST 1 VIEW COMPARISON:  None. FINDINGS: Nasogastric tube tip overlies the expected proximal body of the  stomach within the left upper quadrant of the abdomen. Lungs appear mildly hyperinflated. Patchy left basilar infiltrate is present, possibly infectious or inflammatory in nature. Multiple dilated gas-filled loops of small bowel are seen within the left upper quadrant of the abdomen in keeping with the known small-bowel obstruction seen on CT examination performed at 3:39 p.m. IMPRESSION: Nasogastric tube tip within the left upper quadrant of the abdomen overlying the proximal body of the stomach. Electronically Signed   By: Fidela Salisbury M.D.   On: 03/28/2021 19:19   DG Abd Portable 1V-Small Bowel Obstruction Protocol-initial, 8 hr delay  Result Date: 03/30/2021 CLINICAL DATA:  Small-bowel obstruction EXAM: PORTABLE ABDOMEN - 1 VIEW COMPARISON:  03/29/2021 FINDINGS: Nasogastric tube tip remains in the mid body of the stomach. There is improvement with less dilatation of the small intestine. There may be mild residual distal small bowel dilatation but the situation is markedly improved. No worsening or new finding. No evidence of perforation. IMPRESSION: Improved gas pattern, with less dilatation of the small intestine, consistent with improved/resolving small-bowel obstruction. Electronically Signed   By: Nelson Chimes M.D.   On: 03/30/2021 12:45    Anti-infectives: Anti-infectives (From admission, onward)    None       Assessment/Plan: Impression: Small bowel obstruction.  Patient denies any abdominal pain. Plan: Will get small bowel obstruction protocol study today.  Further management is pending those results.  LOS: 1 day    [facility]iva Signs 03/30/2021

## 2021-03-30 NOTE — Progress Notes (Signed)
Spoke with on-call MD regarding patient's pain and need for pain meds q2h, though patient states this is more controlled than before. Md states patient did well on continuous suction (currently on intermittent) in ED. Will put patient's NG on continuous suction for a few hours to try and also will give new order of ativan along with pain med. Goal is to have patient pain-free and able to rest.

## 2021-03-30 NOTE — Progress Notes (Signed)
Administered contrast to pt's NG tube at 1640. NG tube clamped. Called xray at 1645. Will unclamp at 1740 for portable KUB at 2240.

## 2021-03-30 NOTE — Progress Notes (Signed)
PROGRESS NOTE   Terri Harris  HYW:[id number]    DOB: November 02, 1940    DOA: 03/28/2021  PCP: Kathyrn Drown, MD    Chief Complaint  Patient presents with   Abdominal Pain    Brief Narrative:  79 year old married female with medical history significant for COPD not on home oxygen, SBO, hypertension, GERD, former tobacco use, HLD, abdominal hysterectomy, small bowel resection in 2015, recent COVID-19 infection about 4 weeks prior to admission, presented to the [facility] ED on 03/28/2021 with complaints of abdominal pain.  Last BM 3 days PTA.  Admitted for small bowel obstruction.  General surgery consulted, currently managing conservatively.   Assessment & Plan:  Principal Problem:   SBO (small bowel obstruction) (HCC) Active Problems:   COPD (chronic obstructive pulmonary disease) (HCC)   HTN (hypertension)   COVID-19 virus infection  A/p 1)Small bowel obstruction - CT A/P 10/31: SBO with transition point in the right upper abdomen at the level of the small bowel resection anastomosis.  No findings to suggest bowel ischemia. - Prior history of multiple abdominal surgeries.  Thereby concern for mechanical SBO related to adhesions remains. - General surgery input appreciated.  Discussed with Dr. Arnoldo Morale. -No BM or flatus -Plan is for small bowel follow-through with Gastrografin on 03/30/2021 -Continue NG tube, IV fluids antiemetics and as needed pain medications -Remains n.p.o.  2)Recent COVID-19 infection - Tested positive on 03/03/2021.  Reportedly completed 5-day course of an antiviral medication.  Reportedly has also received 3 doses of the Moderna vaccine. -Elevated D-dimer and CRP noted -Repeat COVID-19 test is positive on 03/28/2021 --- CT abdomen shows patchy airspace opacities in the lung bases which could represent infection or inflammation.  These may be residual findings from earlier COVID-19 infection. - Patient lacks any acute respiratory symptoms apart from  being hypoxic.   - 3)Hypoxia - May be due to recent COVID-19 infection. - No concern for current acute infection. - Supportive management with oxygen, incentive spirometry and wean as tolerated. --Hypoxia has resolved patient has been weaned off O2 will  need to repeat chest x-ray in 4 to 6 weeks.  Leukocytosis - Likely stress margination. -WBC normalized  COPD -No acute exacerbation at this time - Former smoker and quit 15 to 20 years ago.  Essential hypertension -No oral meds/amlodipine due to n.p.o. and NG tube. - Continue as needed IV labetalol.  GERD - IV Protonix while n.p.o.  Hypokalemia--- replace and recheck, magnesium and phosphorus WNL  Body mass index is 19.11 kg/m.  DVT prophylaxis: heparin injection 5,000 Units Start: 03/28/21 2200     Code Status: Full Code Family Communication: Discussed with patient daughter at bedside   disposition:  Status is: Inpatient  Remains inpatient appropriate because: Ongoing SBO that requires intense inpatient close evaluation and monitoring.  Currently remains n.p.o., has NG tube, IV fluids. -Awaiting return of bowel function and tolerance of oral intake    Consultants:   General surgery.  Procedures:   NG tube 10/31.  Antimicrobials:    Anti-infectives (From admission, onward)    None        Subjective:  -daughter At bedside, no BM, no flatus -No fevers no chills no emesis no significant abdominal pain at this time tolerating NG tube decompression okay  Objective:   Vitals:   03/29/21 1951 03/29/21 2000 03/30/21 0410 03/30/21 1301  BP: (!) 155/74  (!) 120/58 136/67  Pulse: 68  67 71  Resp: 18  18   Temp: 98.2  F (36.8 C)  98.3 F (36.8 C) 97.7 F (36.5 C)  TempSrc:   Oral Oral  SpO2: 97% 96% 93% 93%  Weight:      Height:        Physical Exam Gen:- Awake Alert, in no acute distress  HEENT:- [place].AT, No sclera icterus Nose--- NG tube with gastric contents Neck-Supple Neck,No JVD,.  Lungs-  CTAB  , fair air movement bilaterally  CV- S1, S2 normal, RRR Abd-healed abdominal scars, generalized abdominal discomfort with palpation, not particularly distended, overall soft,    Extremity/Skin:- No  edema,   good pedal pulses  Psych-affect is appropriate, oriented x3 Neuro-no new focal deficits, no tremors  Data Reviewed:   I have personally reviewed following labs and imaging studies   CBC: Recent Labs  Lab 03/28/21 1338 03/29/21 0404 03/30/21 0536  WBC 16.3* 12.1* 7.1  HGB 15.4* 14.2 13.3  HCT 46.4* 44.0 42.4  MCV 98.9 98.9 102.7*  PLT 335 311 270    Basic Metabolic Panel: Recent Labs  Lab 03/28/21 1338 03/29/21 0404 03/30/21 0536 03/30/21 0842  NA 137 136 141  --   K 4.0 3.7 3.3*  --   CL 95* 99 107  --   CO2 28 28 28   --   GLUCOSE 130* 114* 99  --   BUN 14 18 13   --   CREATININE 0.94 0.82 0.54  --   CALCIUM 10.0 9.1 8.7*  --   MG  --   --  2.4 2.7*  PHOS  --   --  3.5  --     Liver Function Tests: Recent Labs  Lab 03/28/21 1338  AST 28  ALT 15  ALKPHOS 97  BILITOT 0.5  PROT 8.9*  ALBUMIN 4.8    CBG: No results for input(s): GLUCAP in the last 168 hours.  Microbiology Studies:   Recent Results (from the past 240 hour(s))  Resp Panel by RT-PCR (Flu A&B, Covid) Nasopharyngeal Swab     Status: Abnormal   Collection Time: 03/28/21  5:12 PM   Specimen: Nasopharyngeal Swab; Nasopharyngeal(NP) swabs in vial transport medium  Result Value Ref Range Status   SARS Coronavirus 2 by RT PCR POSITIVE (A) NEGATIVE Final    Comment: RESULT CALLED TO, READ BACK BY AND VERIFIED WITH: B OAKLEY RN [email or web address] [id number] K FORSYTH (NOTE) SARS-CoV-2 target nucleic acids are DETECTED.  The SARS-CoV-2 RNA is generally detectable in upper respiratory specimens during the acute phase of infection. Positive results are indicative of the presence of the identified virus, but do not rule out bacterial infection or co-infection with other pathogens not detected by the test.  Clinical correlation with patient history and other diagnostic information is necessary to determine patient infection status. The expected result is Negative.  Fact Sheet for Patients: EntrepreneurPulse.com.au  Fact Sheet for Healthcare Providers: IncredibleEmployment.be  This test is not yet approved or cleared by the [place] FDA and  has been authorized for detection and/or diagnosis of SARS-CoV-2 by FDA under an Emergency Use Authorization (EUA).  This EUA will remain in effect (meaning this test can be  used) for the duration of  the COVID-19 declaration under Section 564(b)(1) of the Act, 21 U.S.C. section 360bbb-3(b)(1), unless the authorization is terminated or revoked sooner.     Influenza A by PCR NEGATIVE NEGATIVE Final   Influenza B by PCR NEGATIVE NEGATIVE Final    Comment: (NOTE) The Xpert Xpress SARS-CoV-2/FLU/RSV plus assay is intended as an aid in the diagnosis  of influenza from Nasopharyngeal swab specimens and should not be used as a sole basis for treatment. Nasal washings and aspirates are unacceptable for Xpert Xpress SARS-CoV-2/FLU/RSV testing.  Fact Sheet for Patients: EntrepreneurPulse.com.au  Fact Sheet for Healthcare Providers: IncredibleEmployment.be  This test is not yet approved or cleared by the [place] FDA and has been authorized for detection and/or diagnosis of SARS-CoV-2 by FDA under an Emergency Use Authorization (EUA). This EUA will remain in effect (meaning this test can be used) for the duration of the COVID-19 declaration under Section 564(b)(1) of the Act, 21 U.S.C. section 360bbb-3(b)(1), unless the authorization is terminated or revoked.  Performed at [facility], [address] [place], [place] [address]      Radiology Studies:  CT ABDOMEN PELVIS WO CONTRAST  Result Date: 03/29/2021 CLINICAL DATA:  Small-bowel obstruction with increased pain.  EXAM: CT ABDOMEN AND PELVIS WITHOUT CONTRAST TECHNIQUE: Multidetector CT imaging of the abdomen and pelvis was performed following the standard protocol without IV contrast. COMPARISON:  CTs with contrast, yesterday at 3:41 p.m. and 12/28/2014 FINDINGS: Lower chest: Emphysematous and chronic scarring changes in the lung bases are present with scattered peripheral opacities consistent with mult lobar pneumonia. Hepatobiliary: No focal liver abnormality is seen. No gallstones, gallbladder wall thickening, or biliary dilatation. There is contrast in gallbladder and common bile duct. Pancreas: Unremarkable. No pancreatic ductal dilatation or surrounding inflammatory changes. Spleen: Normal in size without focal abnormality. Adrenals/Urinary Tract: There is mild residual contrast enhancement in the bilateral kidneys. The should no longer be seen. Please correlate clinically for potential contrast nephrotoxicity. There is contrast in the collecting systems and in portions of the ureters, and in the unremarkable urinary bladder. No adrenal mass is seen. Stomach/Bowel: Interval NGT insertion with the tip in the body of stomach directed left. Generalized small bowel dilatation is similar to yesterday's study up to 4.2 cm in diameter and appears to transition at the level of a small bowel resection with reanastomosis in the right upper abdomen. The colon is decompressed. No evidence of colitis or diverticulitis. The appendix could not be seen. Vascular/Lymphatic: There is extensive aortoiliac calcified plaque without AAA. The abdominal aorta is tortuous. No abdominal or pelvic adenopathy seen. Reproductive: The uterus is absent.  No adnexal mass is seen. Other: There is a left flank wall hernia containing a nonobstructed short loop of the descending colon. There is minimal ascites in the upper abdomen, left paracolic gutter and trace pelvic ascites increased. Musculoskeletal: There are degenerative and postsurgical changes  lumbar spine, with moderate metallic artifact and asymmetric left hip DJD. IMPRESSION: 1. Small-bowel obstruction with dilated small bowel up to 4.2 cm, and the transition again appears to be in the heavy upper abdomen at a site of small bowel resection and reanastomosis. 2. No bowel pneumatosis or free air are observed. There is increased minimal abdominal and pelvic ascites. 3. Persisting contrast enhancement bilateral renal cortex, should not be seen 11 hours after the prior contrast bolus. Please correlate clinically for patient dehydration or contrast nephrotoxicity. 4. Interval enteric tube placement with tip in the body of stomach directed left. 5. Scattered peripheral opacities in the lower lung fields in the setting of COPD, most likely reflecting multilobar pneumonia. 6. Extensive aortoiliac calcification and additional findings discussed above. Electronically Signed   By: Telford Nab M.D.   On: 03/29/2021 03:32   CT ABDOMEN PELVIS W CONTRAST  Result Date: 03/28/2021 CLINICAL DATA:  Nonlocalized acute abdominal pain. States she had covid the beginning  of the month has had general abdominal pain/bloating ever since. Hx of uterine cancer, HTN, SBO, hysterectomy, bowel resection, hernia repair. EXAM: CT ABDOMEN AND PELVIS WITH CONTRAST TECHNIQUE: Multidetector CT imaging of the abdomen and pelvis was performed using the standard protocol following bolus administration of intravenous contrast. CONTRAST:  10mL OMNIPAQUE IOHEXOL 300 MG/ML  SOLN COMPARISON:  CT abdomen pelvis 12/28/2014. FINDINGS: Lower chest: Patchy airspace opacities of the lung bases. Centrilobular emphysematous changes. Hepatobiliary: No focal liver abnormality. No gallstones, gallbladder wall thickening, or pericholecystic fluid. No biliary dilatation. Pancreas: No focal lesion. Normal pancreatic contour. No surrounding inflammatory changes. No main pancreatic ductal dilatation. Spleen: Normal in size without focal abnormality.  Adrenals/Urinary Tract: No adrenal nodule bilaterally. Bilateral kidneys enhance symmetrically. Subcentimeter hypodensities are too small to characterize. No hydronephrosis. No hydroureter. The urinary bladder is unremarkable. On delayed imaging, there is no urothelial wall thickening and there are no filling defects in the opacified portions of the bilateral collecting systems or ureters. Stomach/Bowel: Fluid dilatation of the small bowel with a transition point in the right upper abdomen the level of a small bowel resection anastomosis. Stomach is within normal limits. No evidence of bowel wall thickening or dilatation. The distal bowel and majority of the colon are decompressed. The appendix is not definitely identified. Vascular/Lymphatic: No abdominal aorta or iliac aneurysm. Severe atherosclerotic plaque of the aorta and its branches. No abdominal, pelvic, or inguinal lymphadenopathy. Reproductive: Status post hysterectomy. No adnexal masses. Other: No intraperitoneal free fluid. No intraperitoneal free gas. No organized fluid collection. Musculoskeletal: No abdominal wall hernia or abnormality. No suspicious lytic or blastic osseous lesions. No acute displaced fracture. Multilevel degenerative changes of the spine in a patient with L1 through L5 posterior and interbody fusion. IMPRESSION: 1. Small-bowel obstruction with transition point in the right upper abdomen at the level of the small bowel resection anastomosis. No associated bowel obstruction or findings to suggest bowel ischemia. 2. Patchy airspace opacities of the lung bases which could represent infection/inflammation. Recommend chest x-ray for further evaluation. 3.  Aortic Atherosclerosis (ICD10-I70.0) - severe 4.  Emphysema (ICD10-J43.9). Electronically Signed   By: Iven Finn M.D.   On: 03/28/2021 16:08   DG Chest Portable 1 View  Result Date: 03/29/2021 CLINICAL DATA:  Nasogastric tube placement EXAM: PORTABLE CHEST 1 VIEW COMPARISON:   03/28/2021 FINDINGS: Nasogastric tube extends into the left upper quadrant of the abdomen beyond the margin of the examination. The proximal side hole of the catheter overlies the gastric fundus, unchanged from prior examination. Lungs appear mildly hyperinflated. Patchy infiltrate noted within the left lung base, possibly infectious or inflammatory in nature. The abdomen is largely excluded from view. IMPRESSION: Nasogastric tube appears unchanged extending into the left upper quadrant of the abdomen. Electronically Signed   By: Fidela Salisbury M.D.   On: 03/29/2021 01:15   DG Chest Portable 1 View  Result Date: 03/28/2021 CLINICAL DATA:  Nasogastric tube placement EXAM: PORTABLE CHEST 1 VIEW COMPARISON:  None. FINDINGS: Nasogastric tube tip overlies the expected proximal body of the stomach within the left upper quadrant of the abdomen. Lungs appear mildly hyperinflated. Patchy left basilar infiltrate is present, possibly infectious or inflammatory in nature. Multiple dilated gas-filled loops of small bowel are seen within the left upper quadrant of the abdomen in keeping with the known small-bowel obstruction seen on CT examination performed at 3:39 p.m. IMPRESSION: Nasogastric tube tip within the left upper quadrant of the abdomen overlying the proximal body of the stomach. Electronically Signed  By: Fidela Salisbury M.D.   On: 03/28/2021 19:19   DG Abd Portable 1V-Small Bowel Obstruction Protocol-initial, 8 hr delay  Result Date: 03/30/2021 CLINICAL DATA:  Small-bowel obstruction EXAM: PORTABLE ABDOMEN - 1 VIEW COMPARISON:  03/29/2021 FINDINGS: Nasogastric tube tip remains in the mid body of the stomach. There is improvement with less dilatation of the small intestine. There may be mild residual distal small bowel dilatation but the situation is markedly improved. No worsening or new finding. No evidence of perforation. IMPRESSION: Improved gas pattern, with less dilatation of the small intestine,  consistent with improved/resolving small-bowel obstruction. Electronically Signed   By: Nelson Chimes M.D.   On: 03/30/2021 12:45     Scheduled Meds:    diatrizoate meglumine-sodium  90 mL Per NG tube Once   heparin  5,000 Units Subcutaneous Q8H   pantoprazole (PROTONIX) IV  40 mg Intravenous Q24H    Continuous Infusions:    dextrose 5 % and 0.9% NaCl 75 mL/hr at 03/29/21 2308    LOS: 1 day   Roxan Hockey, MD,  [facility]    To contact the attending provider between 7A-7P or the covering provider during after hours 7P-7A, please log into the web site [email or web address] and access using universal [place] password for that web site. If you do not have the password, please call the hospital operator.  03/30/2021, 2:59 PM

## 2021-03-31 ENCOUNTER — Ambulatory Visit: Payer: PPO | Admitting: Family Medicine

## 2021-03-31 ENCOUNTER — Inpatient Hospital Stay (HOSPITAL_COMMUNITY): Payer: PPO

## 2021-03-31 LAB — RENAL FUNCTION PANEL
Albumin: 3.5 g/dL (ref 3.5–5.0)
Anion gap: 9 (ref 5–15)
BUN: 6 mg/dL — ABNORMAL LOW (ref 8–23)
CO2: 23 mmol/L (ref 22–32)
Calcium: 8.7 mg/dL — ABNORMAL LOW (ref 8.9–10.3)
Chloride: 108 mmol/L (ref 98–111)
Creatinine, Ser: 0.39 mg/dL — ABNORMAL LOW (ref 0.44–1.00)
GFR, Estimated: >60 mL/min (ref >60)
Glucose, Bld: 89 mg/dL (ref 70–99)
Phosphorus: 3 mg/dL (ref 2.5–4.6)
Potassium: 3.5 mmol/L (ref 3.5–5.1)
Sodium: 140 mmol/L (ref 135–145)

## 2021-03-31 MED ORDER — POTASSIUM CHLORIDE 10 MEQ/100ML IV SOLN
10.0000 meq | INTRAVENOUS | Status: AC
  Administered 2021-03-31 (×3): 10 meq via INTRAVENOUS
  Filled 2021-03-31 (×3): qty 100

## 2021-03-31 MED ORDER — BISACODYL 10 MG RE SUPP
10.0000 mg | Freq: Once | RECTAL | Status: AC
  Administered 2021-03-31: 10 mg via RECTAL
  Filled 2021-03-31: qty 1

## 2021-03-31 MED ORDER — ALUM & MAG HYDROXIDE-SIMETH 200-200-20 MG/5ML PO SUSP
30.0000 mL | ORAL | Status: DC | PRN
  Administered 2021-04-01 – 2021-04-02 (×5): 30 mL via ORAL
  Filled 2021-03-31 (×5): qty 30

## 2021-03-31 MED ORDER — ALUM & MAG HYDROXIDE-SIMETH 200-200-20 MG/5ML PO SUSP
30.0000 mL | ORAL | Status: DC | PRN
  Administered 2021-03-31: 30 mL
  Filled 2021-03-31: qty 30

## 2021-03-31 MED ORDER — POTASSIUM CHLORIDE 10 MEQ/100ML IV SOLN
10.0000 meq | INTRAVENOUS | Status: AC
  Administered 2021-03-31: 10 meq via INTRAVENOUS
  Filled 2021-03-31: qty 100

## 2021-03-31 MED ORDER — INFLUENZA VAC A&B SA ADJ QUAD 0.5 ML IM PRSY
0.5000 mL | PREFILLED_SYRINGE | INTRAMUSCULAR | Status: AC
  Administered 2021-04-02: 0.5 mL via INTRAMUSCULAR
  Filled 2021-03-31: qty 0.5

## 2021-03-31 NOTE — Progress Notes (Signed)
Pt is states was supposed to get today at her primary MD office but since hospitalized, would rather go ahead and get here before being discharged. MD Jenkings aware of pt request and agreeable.

## 2021-03-31 NOTE — Progress Notes (Signed)
PROGRESS NOTE   Terri Harris  UXN:[id number]    DOB: [birth year]    DOA: 03/28/2021  PCP: Kathyrn Drown, MD    Chief Complaint  Patient presents with   Abdominal Pain    Brief Narrative:  80 year old married female with medical history significant for COPD not on home oxygen, SBO, hypertension, GERD, former tobacco use, HLD, abdominal hysterectomy, small bowel resection in 2015, recent COVID-19 infection about 4 weeks prior to admission, presented to the [facility] ED on 03/28/2021 with complaints of abdominal pain.  Last BM 3 days PTA.  Admitted for small bowel obstruction.  General surgery consulted, currently managing conservatively.   Assessment & Plan:  Principal Problem:   SBO (small bowel obstruction) (HCC) Active Problems:   COPD (chronic obstructive pulmonary disease) (HCC)   HTN (hypertension)   COVID-19 virus infection  A/p 1)Small bowel obstruction - CT A/P 10/31: SBO with transition point in the right upper abdomen at the level of the small bowel resection anastomosis.  No findings to suggest bowel ischemia. - Prior history of multiple abdominal surgeries.  Thereby concern for mechanical SBO related to adhesions remains. - General surgery input appreciated.  Discussed with Dr. Arnoldo Morale.  small bowel follow-through with Gastrografin on 03/30/2021 is consistent with resolving SBO -Patient passing scant flatus and had 1 BM -Continue NG tube, IV fluids antiemetics and as needed pain medications -As per general surgeon Dr. Arnoldo Morale patient is to remain n.p.o., possibly to take NG out on 04/01/2021 and try liquids   2)Recent COVID-19 infection - Tested positive on 03/03/2021.  Reportedly completed 5-day course of an antiviral medication.  Reportedly has also received 3 doses of the Moderna vaccine. -Elevated D-dimer and CRP noted -Repeat COVID-19 test is positive on 03/28/2021 --- CT abdomen shows patchy airspace opacities in the lung bases which could represent  infection or inflammation.  These may be residual findings from earlier COVID-19 infection. - Patient lacks any acute respiratory symptoms apart from being hypoxic.   - 3)Hypoxia - May be due to recent COVID-19 infection. - No concern for current acute infection. - Supportive management with oxygen, incentive spirometry and wean as tolerated. --Hypoxia has resolved patient has been weaned off O2 will  need to repeat chest x-ray in 4 to 6 weeks.  Leukocytosis - Likely stress margination. -WBC normalized  COPD -No acute exacerbation at this time - Former smoker and quit 15 to 20 years ago.  Essential hypertension -No oral meds/amlodipine due to n.p.o. and NG tube. - Continue as needed IV labetalol.  GERD - IV Protonix while n.p.o.  Hypokalemia--- replace and recheck, magnesium and phosphorus WNL  Body mass index is 19.11 kg/m.  DVT prophylaxis: heparin injection 5,000 Units Start: 03/28/21 2200     Code Status: Full Code Family Communication: Discussed with patient daughter at bedside   disposition:  Status is: Inpatient  Remains inpatient appropriate because: Ongoing SBO that requires intense inpatient close evaluation and monitoring.  Currently remains n.p.o., has NG tube, IV fluids. -Awaiting return of bowel function and tolerance of oral intake    Consultants:   General surgery.  Procedures:   NG tube 10/31.  Antimicrobials:    Anti-infectives (From admission, onward)    None        Subjective:  --Patient passing scant flatus and had 1 BM -No emesis, abdominal discomfort has improved  Objective:   Vitals:   03/30/21 1301 03/30/21 2019 03/31/21 0336 03/31/21 1347  BP: 136/67 (!) 155/73 129/60 119/66  Pulse: 71 72 (!) 54 (!) 59  Resp:  20 20   Temp: 97.7 F (36.5 C) 98.2 F (36.8 C) 98 F (36.7 C) 97.6 F (36.4 C)  TempSrc: Oral     SpO2: 93% 92% 95%   Weight:      Height:        Physical Exam Gen:- Awake Alert, in no acute distress   HEENT:- [place].AT, No sclera icterus Nose--- NG tube with gastric contents Neck-Supple Neck,No JVD,.  Lungs-  CTAB , fair air movement bilaterally  CV- S1, S2 normal, RRR Abd-healed abdominal scars, generalized abdominal discomfort with palpation, not particularly distended, overall soft,    Extremity/Skin:- No  edema,   good pedal pulses  Psych-affect is appropriate, oriented x3 Neuro-no new focal deficits, no tremors  Data Reviewed:   I have personally reviewed following labs and imaging studies   CBC: Recent Labs  Lab 03/28/21 1338 03/29/21 0404 03/30/21 0536  WBC 16.3* 12.1* 7.1  HGB 15.4* 14.2 13.3  HCT 46.4* 44.0 42.4  MCV 98.9 98.9 102.7*  PLT 335 311 672    Basic Metabolic Panel: Recent Labs  Lab 03/29/21 0404 03/30/21 0536 03/30/21 0842 03/31/21 0605  NA 136 141  --  140  K 3.7 3.3*  --  3.5  CL 99 107  --  108  CO2 28 28  --  23  GLUCOSE 114* 99  --  89  BUN 18 13  --  6*  CREATININE 0.82 0.54  --  0.39*  CALCIUM 9.1 8.7*  --  8.7*  MG  --  2.4 2.7*  --   PHOS  --  3.5  --  3.0    Liver Function Tests: Recent Labs  Lab 03/28/21 1338 03/31/21 0605  AST 28  --   ALT 15  --   ALKPHOS 97  --   BILITOT 0.5  --   PROT 8.9*  --   ALBUMIN 4.8 3.5    CBG: No results for input(s): GLUCAP in the last 168 hours.  Microbiology Studies:   Recent Results (from the past 240 hour(s))  Resp Panel by RT-PCR (Flu A&B, Covid) Nasopharyngeal Swab     Status: Abnormal   Collection Time: 03/28/21  5:12 PM   Specimen: Nasopharyngeal Swab; Nasopharyngeal(NP) swabs in vial transport medium  Result Value Ref Range Status   SARS Coronavirus 2 by RT PCR POSITIVE (A) NEGATIVE Final    Comment: RESULT CALLED TO, READ BACK BY AND VERIFIED WITH: B OAKLEY RN Paxt[place].Ash [id number] K FORSYTH (NOTE) SARS-CoV-2 target nucleic acids are DETECTED.  The SARS-CoV-2 RNA is generally detectable in upper respiratory specimens during the acute phase of infection. Positive results  are indicative of the presence of the identified virus, but do not rule out bacterial infection or co-infection with other pathogens not detected by the test. Clinical correlation with patient history and other diagnostic information is necessary to determine patient infection status. The expected result is Negative.  Fact Sheet for Patients: EntrepreneurPulse.com.au  Fact Sheet for Healthcare Providers: IncredibleEmployment.be  This test is not yet approved or cleared by the [place] FDA and  has been authorized for detection and/or diagnosis of SARS-CoV-2 by FDA under an Emergency Use Authorization (EUA).  This EUA will remain in effect (meaning this test can be  used) for the duration of  the COVID-19 declaration under Section 564(b)(1) of the Act, 21 U.S.C. section 360bbb-3(b)(1), unless the authorization is terminated or revoked sooner.  Influenza A by PCR NEGATIVE NEGATIVE Final   Influenza B by PCR NEGATIVE NEGATIVE Final    Comment: (NOTE) The Xpert Xpress SARS-CoV-2/FLU/RSV plus assay is intended as an aid in the diagnosis of influenza from Nasopharyngeal swab specimens and should not be used as a sole basis for treatment. Nasal washings and aspirates are unacceptable for Xpert Xpress SARS-CoV-2/FLU/RSV testing.  Fact Sheet for Patients: EntrepreneurPulse.com.au  Fact Sheet for Healthcare Providers: IncredibleEmployment.be  This test is not yet approved or cleared by the [place] FDA and has been authorized for detection and/or diagnosis of SARS-CoV-2 by FDA under an Emergency Use Authorization (EUA). This EUA will remain in effect (meaning this test can be used) for the duration of the COVID-19 declaration under Section 564(b)(1) of the Act, 21 U.S.C. section 360bbb-3(b)(1), unless the authorization is terminated or revoked.  Performed at [facility], [address]  [place], [place] [address]      Radiology Studies:  DG Abd 1 View  Result Date: 03/31/2021 CLINICAL DATA:  Small-bowel obstruction.  8 hour delay EXAM: ABDOMEN - 1 VIEW COMPARISON:  Flat plate abdomen film yesterday at 11:51 a.m. FINDINGS: NGT tip remains at the level of the mid body of stomach. Small bowel dilatation continues to improve, currently with maximal small bowel caliber of 3.1 cm in the pelvis, previously 3.6 cm. There is scattered contrast in the colon and pelvic small bowel. There is osteopenia, degenerative and postsurgical changes of the lumbar spine and asymmetric bone-on-bone DJD at the left hip. IMPRESSION: Continued improvement in small bowel dilatation. Electronically Signed   By: Telford Nab M.D.   On: 03/31/2021 01:37   DG Abd Portable 1V-Small Bowel Obstruction Protocol-initial, 8 hr delay  Result Date: 03/30/2021 CLINICAL DATA:  Small-bowel obstruction EXAM: PORTABLE ABDOMEN - 1 VIEW COMPARISON:  03/29/2021 FINDINGS: Nasogastric tube tip remains in the mid body of the stomach. There is improvement with less dilatation of the small intestine. There may be mild residual distal small bowel dilatation but the situation is markedly improved. No worsening or new finding. No evidence of perforation. IMPRESSION: Improved gas pattern, with less dilatation of the small intestine, consistent with improved/resolving small-bowel obstruction. Electronically Signed   By: Nelson Chimes M.D.   On: 03/30/2021 12:45     Scheduled Meds:    heparin  5,000 Units Subcutaneous Q8H   influenza vaccine adjuvanted  0.5 mL Intramuscular Tomorrow-1000   pantoprazole (PROTONIX) IV  40 mg Intravenous Q24H    Continuous Infusions:    dextrose 5 % and 0.9% NaCl 75 mL/hr at 03/31/21 1624   potassium chloride 50 mL/hr at 03/31/21 1624    LOS: 2 days   Roxan Hockey, MD,  [facility]    To contact the attending provider between 7A-7P or the covering provider during after hours 7P-7A,  please log into the web site [email or web address] and access using universal [place] password for that web site. If you do not have the password, please call the hospital operator.  03/31/2021, 5:20 PM

## 2021-03-31 NOTE — Progress Notes (Signed)
  Subjective: Patient is starting to pass flatus.  She has not had a bowel movement yet.  She has variable abdominal cramping.  Objective: Vital signs in last 24 hours: Temp:  [97.7 F (36.5 C)-98.2 F (36.8 C)] 98 F (36.7 C) (11/03 0336) Pulse Rate:  [54-72] 54 (11/03 0336) Resp:  [20] 20 (11/03 0336) BP: (129-155)/(60-73) 129/60 (11/03 0336) SpO2:  [92 %-95 %] 95 % (11/03 0336) Last BM Date:  (pt states unsure)  Intake/Output from previous day: 11/02 0701 - 11/03 0700 In: 2836.5 [P.O.:100; I.V.:2438.1; IV Piggyback:298.4] Out: 2100 [Urine:1000; Emesis/NG output:1100] Intake/Output this shift: Total I/O In: -  Out: 500 [Urine:400; Emesis/NG output:100]  General appearance: alert, cooperative, and no distress GI: soft, non-tender; bowel sounds normal; no masses,  no organomegaly  Lab Results:  Recent Labs    03/29/21 0404 03/30/21 0536  WBC 12.1* 7.1  HGB 14.2 13.3  HCT 44.0 42.4  PLT 311 248   BMET Recent Labs    03/30/21 0536 03/31/21 0605  NA 141 140  K 3.3* 3.5  CL 107 108  CO2 28 23  GLUCOSE 99 89  BUN 13 6*  CREATININE 0.54 0.39*  CALCIUM 8.7* 8.7*   PT/INR No results for input(s): LABPROT, INR in the last 72 hours.  Studies/Results: DG Abd Portable 1V-Small Bowel Obstruction Protocol-initial, 8 hr delay  Result Date: 03/30/2021 CLINICAL DATA:  Small-bowel obstruction EXAM: PORTABLE ABDOMEN - 1 VIEW COMPARISON:  03/29/2021 FINDINGS: Nasogastric tube tip remains in the mid body of the stomach. There is improvement with less dilatation of the small intestine. There may be mild residual distal small bowel dilatation but the situation is markedly improved. No worsening or new finding. No evidence of perforation. IMPRESSION: Improved gas pattern, with less dilatation of the small intestine, consistent with improved/resolving small-bowel obstruction. Electronically Signed   By: Nelson Chimes M.D.   On: 03/30/2021 12:45    Anti-infectives: Anti-infectives  (From admission, onward)    None       Assessment/Plan: Impression: Resolving small bowel obstruction.  She still has a significant amount of NG tube output.  This may be a partial small bowel obstruction.  I told the patient that I would like to leave the NG tube in today but may pull it out tomorrow and advance her diet.  She understands and agrees.  LOS: 2 days    Aviva Signs 03/31/2021

## 2021-03-31 NOTE — Progress Notes (Signed)
Pt had medium sized type 6 BM, brown in color post biscodyl suppository. Still passing gas. Abd remains soft, non-distended.

## 2021-03-31 NOTE — Progress Notes (Addendum)
NG tube removed per MD order, pt tolerated well. Pt c/o indigestion, maalox given per order. Pt also requested IV pain med for her low back pain rated 7/10.

## 2021-03-31 NOTE — Progress Notes (Addendum)
Pt A&O, c/o crampy abd pain but states better than yesterday. Abd much softer and non-distended this am. Pt states is passing gas but no BM yet. NGT intact to intermittent low wall suction with small amount green colored drainage. MD Arnoldo Morale in to examine pt and review plan of care for today. Pt states understanding.

## 2021-04-01 LAB — BASIC METABOLIC PANEL
Anion gap: 8 (ref 5–15)
BUN: <5 mg/dL — ABNORMAL LOW (ref 8–23)
CO2: 25 mmol/L (ref 22–32)
Calcium: 8.8 mg/dL — ABNORMAL LOW (ref 8.9–10.3)
Chloride: 103 mmol/L (ref 98–111)
Creatinine, Ser: 0.43 mg/dL — ABNORMAL LOW (ref 0.44–1.00)
GFR, Estimated: >60 mL/min (ref >60)
Glucose, Bld: 99 mg/dL (ref 70–99)
Potassium: 3.4 mmol/L — ABNORMAL LOW (ref 3.5–5.1)
Sodium: 136 mmol/L (ref 135–145)

## 2021-04-01 MED ORDER — POTASSIUM CHLORIDE CRYS ER 20 MEQ PO TBCR
40.0000 meq | EXTENDED_RELEASE_TABLET | ORAL | Status: AC
  Administered 2021-04-01: 40 meq via ORAL
  Filled 2021-04-01: qty 2

## 2021-04-01 MED ORDER — POTASSIUM CHLORIDE CRYS ER 20 MEQ PO TBCR
40.0000 meq | EXTENDED_RELEASE_TABLET | Freq: Once | ORAL | Status: AC
  Administered 2021-04-01: 40 meq via ORAL
  Filled 2021-04-01: qty 2

## 2021-04-01 NOTE — Progress Notes (Signed)
PROGRESS NOTE   Terri Harris  XLK:[id number]    DOB: 1941/05/09    DOA: 03/28/2021  PCP: Kathyrn Drown, MD    Chief Complaint  Patient presents with   Abdominal Pain    Brief Narrative:  80 year old married female with medical history significant for COPD not on home oxygen, SBO, hypertension, GERD, former tobacco use, HLD, abdominal hysterectomy, small bowel resection in 2015, recent COVID-19 infection about 4 weeks prior to admission, presented to the [facility] ED on 03/28/2021 with complaints of abdominal pain.  Last BM 3 days PTA.  Admitted for small bowel obstruction.  General surgery consulted, currently managing conservatively.   Assessment & Plan:  Principal Problem:   SBO (small bowel obstruction) (HCC) Active Problems:   COPD (chronic obstructive pulmonary disease) (HCC)   HTN (hypertension)   COVID-19 virus infection  A/p 1)Partial Small bowel obstruction - CT A/P 03/28/21: SBO with transition point in the right upper abdomen at the level of the small bowel resection anastomosis.  No findings to suggest bowel ischemia. - Prior history of multiple abdominal surgeries.  Thereby concern for mechanical SBO related to adhesions remains. - General surgery input appreciated.  Discussed with Dr. Arnoldo Morale.  small bowel follow-through with Gastrografin on 03/30/2021 is consistent with resolving SBO -- NG tube removed later on 03/31/2021 -Patient is passing gas and having small BMs, as per general surgeon okay gave liquid diet, -Overall clinically improving  2)Recent COVID-19 infection - Tested positive on 03/03/2021.  Reportedly completed 5-day course of an antiviral medication.  Reportedly has also received 3 doses of the Moderna vaccine. -Elevated D-dimer and CRP noted -Repeat COVID-19 test is positive on 03/28/2021 --- CT abdomen shows patchy airspace opacities in the lung bases which could represent infection or inflammation.  These may be residual findings from  earlier COVID-19 infection. - Patient lacks any acute respiratory symptoms apart from being hypoxic.   - 3)Hypoxia - May be due to recent COVID-19 infection. - No concern for current acute infection. - Supportive management with oxygen, incentive spirometry and wean as tolerated. --Hypoxia has resolved patient has been weaned off O2 will  need to repeat chest x-ray in 4 to 6 weeks.  Leukocytosis - Likely stress margination. -WBC normalized  COPD -No acute exacerbation at this time - Former smoker and quit 15 to 20 years ago.  Essential hypertension -No oral meds/amlodipine due to n.p.o. and NG tube. - Continue as needed IV labetalol.  GERD - IV Protonix while n.p.o.  Hypokalemia--- replace and recheck, magnesium and phosphorus WNL  Body mass index is 19.11 kg/m.  DVT prophylaxis: heparin injection 5,000 Units Start: 03/28/21 2200     Code Status: Full Code Family Communication: Discussed with patient daughter at bedside   disposition:  Status is: Inpatient  Remains inpatient appropriate because: Ongoing SBO that requires intense inpatient close evaluation and monitoring.  Currently remains n.p.o., has NG tube, IV fluids. -Awaiting return of bowel function and tolerance of oral intake -Possible discharge home on 04/02/2021 if tolerating oral intake    Consultants:   General surgery.  Procedures:   NG tube 10/31.  Antimicrobials:    Anti-infectives (From admission, onward)    None        Subjective:  -- Having small BMs and passing gas, tolerating liquid diet, husband at bedside, questions answered  Objective:   Vitals:   03/31/21 1347 03/31/21 2041 04/01/21 0426 04/01/21 1424  BP: 119/66 134/72 127/74 138/68  Pulse: (!) 59 66  68 72  Resp:  18 17 16   Temp: 97.6 F (36.4 C) 98 F (36.7 C) 98.7 F (37.1 C) 98.6 F (37 C)  TempSrc:  Oral    SpO2:  94% 93% 96%  Weight:      Height:        Physical Exam Gen:- Awake Alert, in no acute distress   HEENT:- [place].AT, No sclera icterus Neck-Supple Neck,No JVD,.  Lungs-  CTAB , fair air movement bilaterally  CV- S1, S2 normal, RRR Abd-healed abdominal scars, no significant tenderness not particularly distended, overall soft,    Extremity/Skin:- No  edema,   good pedal pulses  Psych-affect is appropriate, oriented x3 Neuro-no new focal deficits, no tremors  Data Reviewed:   I have personally reviewed following labs and imaging studies   CBC: Recent Labs  Lab 03/28/21 1338 03/29/21 0404 03/30/21 0536  WBC 16.3* 12.1* 7.1  HGB 15.4* 14.2 13.3  HCT 46.4* 44.0 42.4  MCV 98.9 98.9 102.7*  PLT 335 311 256    Basic Metabolic Panel: Recent Labs  Lab 03/30/21 0536 03/30/21 0842 03/31/21 0605 04/01/21 0510  NA 141  --  140 136  K 3.3*  --  3.5 3.4*  CL 107  --  108 103  CO2 28  --  23 25  GLUCOSE 99  --  89 99  BUN 13  --  6* <5*  CREATININE 0.54  --  0.39* 0.43*  CALCIUM 8.7*  --  8.7* 8.8*  MG 2.4 2.7*  --   --   PHOS 3.5  --  3.0  --     Liver Function Tests: Recent Labs  Lab 03/28/21 1338 03/31/21 0605  AST 28  --   ALT 15  --   ALKPHOS 97  --   BILITOT 0.5  --   PROT 8.9*  --   ALBUMIN 4.8 3.5    CBG: No results for input(s): GLUCAP in the last 168 hours.  Microbiology Studies:   Recent Results (from the past 240 hour(s))  Resp Panel by RT-PCR (Flu A&B, Covid) Nasopharyngeal Swab     Status: Abnormal   Collection Time: 03/28/21  5:12 PM   Specimen: Nasopharyngeal Swab; Nasopharyngeal(NP) swabs in vial transport medium  Result Value Ref Range Status   SARS Coronavirus 2 by RT PCR POSITIVE (A) NEGATIVE Final    Comment: RESULT CALLED TO, READ BACK BY AND VERIFIED WITH: B OAKLEY RN Paxt[place].Ash [id number] K FORSYTH (NOTE) SARS-CoV-2 target nucleic acids are DETECTED.  The SARS-CoV-2 RNA is generally detectable in upper respiratory specimens during the acute phase of infection. Positive results are indicative of the presence of the identified virus, but do not  rule out bacterial infection or co-infection with other pathogens not detected by the test. Clinical correlation with patient history and other diagnostic information is necessary to determine patient infection status. The expected result is Negative.  Fact Sheet for Patients: EntrepreneurPulse.com.au  Fact Sheet for Healthcare Providers: IncredibleEmployment.be  This test is not yet approved or cleared by the [place] FDA and  has been authorized for detection and/or diagnosis of SARS-CoV-2 by FDA under an Emergency Use Authorization (EUA).  This EUA will remain in effect (meaning this test can be  used) for the duration of  the COVID-19 declaration under Section 564(b)(1) of the Act, 21 U.S.C. section 360bbb-3(b)(1), unless the authorization is terminated or revoked sooner.     Influenza A by PCR NEGATIVE NEGATIVE Final   Influenza B by PCR  NEGATIVE NEGATIVE Final    Comment: (NOTE) The Xpert Xpress SARS-CoV-2/FLU/RSV plus assay is intended as an aid in the diagnosis of influenza from Nasopharyngeal swab specimens and should not be used as a sole basis for treatment. Nasal washings and aspirates are unacceptable for Xpert Xpress SARS-CoV-2/FLU/RSV testing.  Fact Sheet for Patients: EntrepreneurPulse.com.au  Fact Sheet for Healthcare Providers: IncredibleEmployment.be  This test is not yet approved or cleared by the [place] FDA and has been authorized for detection and/or diagnosis of SARS-CoV-2 by FDA under an Emergency Use Authorization (EUA). This EUA will remain in effect (meaning this test can be used) for the duration of the COVID-19 declaration under Section 564(b)(1) of the Act, 21 U.S.C. section 360bbb-3(b)(1), unless the authorization is terminated or revoked.  Performed at [facility], [address] [place], [place] [address]      Radiology Studies:  DG Abd 1 View  Result  Date: 03/31/2021 CLINICAL DATA:  Small-bowel obstruction.  8 hour delay EXAM: ABDOMEN - 1 VIEW COMPARISON:  Flat plate abdomen film yesterday at 11:51 a.m. FINDINGS: NGT tip remains at the level of the mid body of stomach. Small bowel dilatation continues to improve, currently with maximal small bowel caliber of 3.1 cm in the pelvis, previously 3.6 cm. There is scattered contrast in the colon and pelvic small bowel. There is osteopenia, degenerative and postsurgical changes of the lumbar spine and asymmetric bone-on-bone DJD at the left hip. IMPRESSION: Continued improvement in small bowel dilatation. Electronically Signed   By: Telford Nab M.D.   On: 03/31/2021 01:37     Scheduled Meds:    heparin  5,000 Units Subcutaneous Q8H   influenza vaccine adjuvanted  0.5 mL Intramuscular Tomorrow-1000   pantoprazole (PROTONIX) IV  40 mg Intravenous Q24H   potassium chloride  40 mEq Oral Once    Continuous Infusions:    dextrose 5 % and 0.9% NaCl 75 mL/hr at 03/31/21 2050    LOS: 3 days   Roxan Hockey, MD,  [facility]    To contact the attending provider between 7A-7P or the covering provider during after hours 7P-7A, please log into the web site [email or web address] and access using universal [place] password for that web site. If you do not have the password, please call the hospital operator.  04/01/2021, 5:04 PM

## 2021-04-01 NOTE — Progress Notes (Signed)
  Subjective: Patient feels slightly better.  She does complain of epigastric pain, though she states this is chronic due to a history of gastritis and peptic ulcer disease.  She has not had emesis.  She is still passing gas.  She has had multiple bowel movements since yesterday afternoon.  She is tolerating a clear liquid diet well.  Objective: Vital signs in last 24 hours: Temp:  [98 F (36.7 C)-98.7 F (37.1 C)] 98.6 F (37 C) (11/04 1424) Pulse Rate:  [66-72] 72 (11/04 1424) Resp:  [16-18] 16 (11/04 1424) BP: (127-138)/(68-74) 138/68 (11/04 1424) SpO2:  [93 %-96 %] 96 % (11/04 1424) Last BM Date: 03/31/21  Intake/Output from previous day: 11/03 0701 - 11/04 0700 In: 2175.7 [P.O.:100; I.V.:1779.3; IV Piggyback:296.4] Out: 1750 [Urine:1450; Emesis/NG output:300] Intake/Output this shift: No intake/output data recorded.  General appearance: alert, cooperative, and no distress GI: soft, non-tender; bowel sounds normal; no masses,  no organomegaly  Lab Results:  Recent Labs    03/30/21 0536  WBC 7.1  HGB 13.3  HCT 42.4  PLT 248   BMET Recent Labs    03/31/21 0605 04/01/21 0510  NA 140 136  K 3.5 3.4*  CL 108 103  CO2 23 25  GLUCOSE 89 99  BUN 6* <5*  CREATININE 0.39* 0.43*  CALCIUM 8.7* 8.8*   PT/INR No results for input(s): LABPROT, INR in the last 72 hours.  Studies/Results: DG Abd 1 View  Result Date: 03/31/2021 CLINICAL DATA:  Small-bowel obstruction.  8 hour delay EXAM: ABDOMEN - 1 VIEW COMPARISON:  Flat plate abdomen film yesterday at 11:51 a.m. FINDINGS: NGT tip remains at the level of the mid body of stomach. Small bowel dilatation continues to improve, currently with maximal small bowel caliber of 3.1 cm in the pelvis, previously 3.6 cm. There is scattered contrast in the colon and pelvic small bowel. There is osteopenia, degenerative and postsurgical changes of the lumbar spine and asymmetric bone-on-bone DJD at the left hip. IMPRESSION: Continued  improvement in small bowel dilatation. Electronically Signed   By: Telford Nab M.D.   On: 03/31/2021 01:37    Anti-infectives: Anti-infectives (From admission, onward)    None       Assessment/Plan: Impression: Small bowel obstruction, resolving Plan: Advance to full liquid diet.  No need for acute surgical invention at this time.  LOS: 3 days    Aviva Signs 04/01/2021

## 2021-04-02 ENCOUNTER — Inpatient Hospital Stay (HOSPITAL_COMMUNITY): Payer: PPO

## 2021-04-02 LAB — RENAL FUNCTION PANEL
Albumin: 3.1 g/dL — ABNORMAL LOW (ref 3.5–5.0)
Anion gap: 5 (ref 5–15)
BUN: <5 mg/dL — ABNORMAL LOW (ref 8–23)
CO2: 25 mmol/L (ref 22–32)
Calcium: 8.6 mg/dL — ABNORMAL LOW (ref 8.9–10.3)
Chloride: 107 mmol/L (ref 98–111)
Creatinine, Ser: 0.42 mg/dL — ABNORMAL LOW (ref 0.44–1.00)
GFR, Estimated: >60 mL/min (ref >60)
Glucose, Bld: 100 mg/dL — ABNORMAL HIGH (ref 70–99)
Phosphorus: 2.6 mg/dL (ref 2.5–4.6)
Potassium: 4.5 mmol/L (ref 3.5–5.1)
Sodium: 137 mmol/L (ref 135–145)

## 2021-04-02 NOTE — Progress Notes (Signed)
PROGRESS NOTE   Terri Harris  CBS:[id number]    DOB: 11-19-1940    DOA: 03/28/2021  PCP: Kathyrn Drown, MD    Chief Complaint  Patient presents with   Abdominal Pain    Brief Narrative:  80 year old married female with medical history significant for COPD not on home oxygen, SBO, hypertension, GERD, former tobacco use, HLD, abdominal hysterectomy, small bowel resection in 2015, recent COVID-19 infection about 4 weeks prior to admission, presented to the [facility] ED on 03/28/2021 with complaints of abdominal pain.  Last BM 3 days PTA.  Admitted for small bowel obstruction.  General surgery consulted, currently managing conservatively.   Assessment & Plan:  Principal Problem:   SBO (small bowel obstruction) (HCC) Active Problems:   COPD (chronic obstructive pulmonary disease) (HCC)   HTN (hypertension)   COVID-19 virus infection  A/p 1)Partial Small bowel obstruction - CT A/P 03/28/21: SBO with transition point in the right upper abdomen at the level of the small bowel resection anastomosis.  No findings to suggest bowel ischemia. - Prior history of multiple abdominal surgeries.  Thereby concern for mechanical SBO related to adhesions remains. - General surgery input appreciated.  Discussed with Dr. Arnoldo Morale.  small bowel follow-through with Gastrografin on 03/30/2021 is consistent with resolving SBO -- NG tube removed later on 03/31/2021 -Patient is passing gas and having small BMs,  -Overall clinically improving -I Was attempting to discharge patient home on 04/02/2021 however patient and daughter states that after lunch patient was having increasing abdominal pain with some nausea --- -Given patient's history of recurrent at times recalcitrant bowel obstruction requiring surgery in the past--we will continue to monitor over the next 24 hours anticipate discharge on 04/03/2021 if tolerating full liquid/soft diet -Encourage patient to ambulate  2)Recent COVID-19  infection - Tested positive on 03/03/2021.  Reportedly completed 5-day course of an antiviral medication.  Reportedly has also received 3 doses of the Moderna vaccine. -Elevated D-dimer and CRP noted -Repeat COVID-19 test is positive on 03/28/2021 --- CT abdomen shows patchy airspace opacities in the lung bases which could represent infection or inflammation.  These may be residual findings from earlier COVID-19 infection. - Patient lacks any acute respiratory symptoms apart from being hypoxic.   - 3)Hypoxia - May be due to recent COVID-19 infection. - No concern for current acute infection. - Supportive management with oxygen, incentive spirometry and wean as tolerated. --Hypoxia has resolved patient has been weaned off O2 --will  need to repeat chest x-ray in 4 to 6 weeks.  Leukocytosis - Likely stress margination. -WBC normalized  COPD -No acute exacerbation at this time - Former smoker and quit 15 to 20 years ago.  Essential hypertension -No oral meds/amlodipine due to n.p.o. and NG tube. - Continue as needed IV labetalol.  GERD - PPI as ordered  Hypokalemia--- replaced, magnesium and phosphorus WNL  Body mass index is 19.11 kg/m.  DVT prophylaxis: heparin injection 5,000 Units Start: 03/28/21 2200     Code Status: Full Code Family Communication: Discussed with patient daughter at bedside   disposition:  Status is: Inpatient  Remains inpatient appropriate because: Ongoing SBO that requires intense inpatient close evaluation and monitoring.   --I Was attempting to discharge patient home on 04/02/2021 however patient and daughter states that after lunch patient was having increasing abdominal pain with some nausea --- -Given patient's history of recurrent at times recalcitrant bowel obstruction requiring surgery in the past--we will continue to monitor over the next 24  hours anticipate discharge on 04/03/2021 if tolerating full liquid/soft diet -Encourage patient to  ambulate    Consultants:   General surgery.  Procedures:   NG tube 10/31.  Antimicrobials:    Anti-infectives (From admission, onward)    None        Subjective:  ---I Was attempting to discharge patient home on 04/02/2021 however patient and daughter states that after lunch patient was having increasing abdominal pain with some nausea --- -Given patient's history of recurrent at times recalcitrant bowel obstruction requiring surgery in the past--we will continue to monitor over the next 24 hours anticipate discharge on 04/03/2021 if tolerating full liquid/soft diet -Encourage patient to ambulate No fever  Or chills   Objective:   Vitals:   04/01/21 0426 04/01/21 1424 04/01/21 2117 04/02/21 0442  BP: 127/74 138/68 129/71 114/64  Pulse: 68 72 68 67  Resp: 17 16 18 18   Temp: 98.7 F (37.1 C) 98.6 F (37 C) 98.1 F (36.7 C) 98.2 F (36.8 C)  TempSrc:    Oral  SpO2: 93% 96% 94% 95%  Weight:      Height:        Physical Exam Gen:- Awake Alert, in no acute distress  HEENT:- .AT, No sclera icterus Neck-Supple Neck,No JVD,.  Lungs-  CTAB , fair air movement bilaterally  CV- S1, S2 normal, RRR Abd-healed abdominal scars, mild tenderness today after lunch, no significant distention, bowel sounds noted  extremity/Skin:- No  edema,   good pedal pulses  Psych-affect is appropriate, oriented x3 Neuro-no new focal deficits, no tremors  Data Reviewed:   I have personally reviewed following labs and imaging studies   CBC: Recent Labs  Lab 03/28/21 1338 03/29/21 0404 03/30/21 0536  WBC 16.3* 12.1* 7.1  HGB 15.4* 14.2 13.3  HCT 46.4* 44.0 42.4  MCV 98.9 98.9 102.7*  PLT 335 311 193    Basic Metabolic Panel: Recent Labs  Lab 03/30/21 0536 03/30/21 0842 03/31/21 0605 04/01/21 0510 04/02/21 0458  NA 141  --  140 136 137  K 3.3*  --  3.5 3.4* 4.5  CL 107  --  108 103 107  CO2 28  --  23 25 25   GLUCOSE 99  --  89 99 100*  BUN 13  --  6* <5* <5*  CREATININE  0.54  --  0.39* 0.43* 0.42*  CALCIUM 8.7*  --  8.7* 8.8* 8.6*  MG 2.4 2.7*  --   --   --   PHOS 3.5  --  3.0  --  2.6    Liver Function Tests: Recent Labs  Lab 03/28/21 1338 03/31/21 0605 04/02/21 0458  AST 28  --   --   ALT 15  --   --   ALKPHOS 97  --   --   BILITOT 0.5  --   --   PROT 8.9*  --   --   ALBUMIN 4.8 3.5 3.1*    CBG: No results for input(s): GLUCAP in the last 168 hours.  Microbiology Studies:   Recent Results (from the past 240 hour(s))  Resp Panel by RT-PCR (Flu A&B, Covid) Nasopharyngeal Swab     Status: Abnormal   Collection Time: 03/28/21  5:12 PM   Specimen: Nasopharyngeal Swab; Nasopharyngeal(NP) swabs in vial transport medium  Result Value Ref Range Status   SARS Coronavirus 2 by RT PCR POSITIVE (A) NEGATIVE Final    Comment: RESULT CALLED TO, READ BACK BY AND VERIFIED WITH: Thomasenia Sales RN [phone]  [id number] K FORSYTH (NOTE) SARS-CoV-2 target nucleic acids are DETECTED.  The SARS-CoV-2 RNA is generally detectable in upper respiratory specimens during the acute phase of infection. Positive results are indicative of the presence of the identified virus, but do not rule out bacterial infection or co-infection with other pathogens not detected by the test. Clinical correlation with patient history and other diagnostic information is necessary to determine patient infection status. The expected result is Negative.  Fact Sheet for Patients: EntrepreneurPulse.com.au  Fact Sheet for Healthcare Providers: IncredibleEmployment.be  This test is not yet approved or cleared by the [place] FDA and  has been authorized for detection and/or diagnosis of SARS-CoV-2 by FDA under an Emergency Use Authorization (EUA).  This EUA will remain in effect (meaning this test can be  used) for the duration of  the COVID-19 declaration under Section 564(b)(1) of the Act, 21 U.S.C. section 360bbb-3(b)(1), unless the authorization  is terminated or revoked sooner.     Influenza A by PCR NEGATIVE NEGATIVE Final   Influenza B by PCR NEGATIVE NEGATIVE Final    Comment: (NOTE) The Xpert Xpress SARS-CoV-2/FLU/RSV plus assay is intended as an aid in the diagnosis of influenza from Nasopharyngeal swab specimens and should not be used as a sole basis for treatment. Nasal washings and aspirates are unacceptable for Xpert Xpress SARS-CoV-2/FLU/RSV testing.  Fact Sheet for Patients: EntrepreneurPulse.com.au  Fact Sheet for Healthcare Providers: IncredibleEmployment.be  This test is not yet approved or cleared by the [place] FDA and has been authorized for detection and/or diagnosis of SARS-CoV-2 by FDA under an Emergency Use Authorization (EUA). This EUA will remain in effect (meaning this test can be used) for the duration of the COVID-19 declaration under Section 564(b)(1) of the Act, 21 U.S.C. section 360bbb-3(b)(1), unless the authorization is terminated or revoked.  Performed at [facility], [address] [place], [place] [address]      Radiology Studies:  No results found.   Scheduled Meds:    heparin  5,000 Units Subcutaneous Q8H   pantoprazole (PROTONIX) IV  40 mg Intravenous Q24H    Continuous Infusions:    dextrose 5 % and 0.9% NaCl 75 mL/hr at 04/01/21 2008    LOS: 4 days   Roxan Hockey, MD,  [facility]    To contact the attending provider between 7A-7P or the covering provider during after hours 7P-7A, please log into the web site [email or web address] and access using universal [employer] password for that web site. If you do not have the password, please call the hospital operator.  04/02/2021, 1:49 PM

## 2021-04-02 NOTE — Progress Notes (Signed)
  Subjective: Patient tolerating full liquid diet well.  Denies any nausea or vomiting.  She has had multiple bowel movements over the past 24 hours.  Objective: Vital signs in last 24 hours: Temp:  [98.1 F (36.7 C)-98.6 F (37 C)] 98.2 F (36.8 C) (11/05 0442) Pulse Rate:  [67-72] 67 (11/05 0442) Resp:  [16-18] 18 (11/05 0442) BP: (114-138)/(64-71) 114/64 (11/05 0442) SpO2:  [94 %-96 %] 95 % (11/05 0442) Last BM Date: 04/02/21  Intake/Output from previous day: 11/04 0701 - 11/05 0700 In: 360 [P.O.:360] Out: 1150 [Urine:1150] Intake/Output this shift: Total I/O In: 240 [P.O.:240] Out: -   General appearance: alert, cooperative, and no distress GI: soft, non-tender; bowel sounds normal; no masses,  no organomegaly  Lab Results:  No results for input(s): WBC, HGB, HCT, PLT in the last 72 hours. BMET Recent Labs    04/01/21 0510 04/02/21 0458  NA 136 137  K 3.4* 4.5  CL 103 107  CO2 25 25  GLUCOSE 99 100*  BUN <5* <5*  CREATININE 0.43* 0.42*  CALCIUM 8.8* 8.6*   PT/INR No results for input(s): LABPROT, INR in the last 72 hours.  Studies/Results: No results found.  Anti-infectives: Anti-infectives (From admission, onward)    None       Assessment/Plan: Impression: Partial small bowel obstruction, resolved Plan: Advance to soft diet.  Okay for discharge from surgery standpoint.  LOS: 4 days    Aviva Signs 04/02/2021

## 2021-04-03 MED ORDER — ACETAMINOPHEN 325 MG PO TABS
650.0000 mg | ORAL_TABLET | Freq: Four times a day (QID) | ORAL | 1 refills | Status: AC | PRN
Start: 2021-04-03 — End: ?

## 2021-04-03 MED ORDER — PANTOPRAZOLE SODIUM 40 MG PO TBEC: 40.0000 mg | DELAYED_RELEASE_TABLET | Freq: Two times a day (BID) | ORAL | 3 refills | Status: DC

## 2021-04-03 MED ORDER — POLYETHYLENE GLYCOL 3350 17 G PO PACK: 17.0000 g | PACK | Freq: Every day | ORAL | 0 refills | Status: DC

## 2021-04-03 NOTE — Discharge Instructions (Signed)
1)Avoid ibuprofen/Advil/Aleve/Motrin/Goody Powders/Naproxen/BC powders/Meloxicam/Diclofenac/Indomethacin and other Nonsteroidal anti-inflammatory medications as these will make you more likely to bleed and can cause stomach ulcers, can also cause Kidney problems.   2)Soft Diet Advised, Avoid Bread/Pizza and Hard to Digest Foods----   3)Follow up with Dr. Laural Golden Gastroenterologist---in 1 to 2 weeks for recheck

## 2021-04-03 NOTE — Progress Notes (Signed)
  Subjective: Tolerating regular diet well.  Denies any abdominal pain or nausea.  Objective: Vital signs in last 24 hours: Temp:  [97.8 F (36.6 C)-98.4 F (36.9 C)] 98.4 F (36.9 C) (11/06 0527) Pulse Rate:  [66-72] 72 (11/06 0527) Resp:  [16-18] 18 (11/06 0527) BP: (113-134)/(54-61) 117/61 (11/06 0527) SpO2:  [95 %-96 %] 96 % (11/06 0527) Last BM Date: 04/02/21  Intake/Output from previous day: 11/05 0701 - 11/06 0700 In: 600 [P.O.:600] Out: -  Intake/Output this shift: Total I/O In: 180 [P.O.:180] Out: -   General appearance: alert, cooperative, and no distress GI: soft, non-tender; bowel sounds normal; no masses,  no organomegaly  Lab Results:  No results for input(s): WBC, HGB, HCT, PLT in the last 72 hours. BMET Recent Labs    04/01/21 0510 04/02/21 0458  NA 136 137  K 3.4* 4.5  CL 103 107  CO2 25 25  GLUCOSE 99 100*  BUN <5* <5*  CREATININE 0.43* 0.42*  CALCIUM 8.8* 8.6*   PT/INR No results for input(s): LABPROT, INR in the last 72 hours.  Studies/Results: DG ABD ACUTE 2+V W 1V CHEST  Result Date: 04/02/2021 CLINICAL DATA:  Abdominal pain constipation EXAM: DG ABDOMEN ACUTE WITH 1 VIEW CHEST COMPARISON:  03/31/2021 FINDINGS: COPD. Lungs are clear without infiltrate or effusion. Heart size and vascularity normal Normal bowel gas pattern. No obstruction or free air. No urinary tract calculi. Prior back surgery. IMPRESSION: COPD without acute cardiopulmonary abnormality Nonobstructive bowel gas pattern. Electronically Signed   By: Franchot Gallo M.D.   On: 04/02/2021 17:39    Anti-infectives: Anti-infectives (From admission, onward)    None       Assessment/Plan: Impression: Partial bowel obstruction, resolved Plan: Patient to be discharged today.  We will sign off.  LOS: 5 days    Aviva Signs 04/03/2021

## 2021-04-03 NOTE — Discharge Summary (Signed)
Terri Harris, is a 80 y.o. female  DOB 1940/12/17  MRN [id number].  Admission date:  03/28/2021  Admitting Physician  Bethena Roys, MD  Discharge Date:  04/03/2021   Primary MD  Kathyrn Drown, MD  Recommendations for primary care physician for things to follow:   1)Avoid ibuprofen/Advil/Aleve/Motrin/Goody Powders/Naproxen/BC powders/Meloxicam/Diclofenac/Indomethacin and other Nonsteroidal anti-inflammatory medications as these will make you more likely to bleed and can cause stomach ulcers, can also cause Kidney problems.   2)Soft Diet Advised, Avoid Bread/Pizza and Hard to Digest Foods----   3)Follow up with Dr. Laural Golden Gastroenterologist---in 1 to 2 weeks for recheck  Admission Diagnosis  Small bowel obstruction ([place]) [K56.609] SBO (small bowel obstruction) ([place]) [K56.609]  Discharge Diagnosis  Small bowel obstruction ([place]) [K56.609] SBO (small bowel obstruction) ([place]) [K56.609]    Principal Problem:   SBO (small bowel obstruction) ([place]) Active Problems:   COPD (chronic obstructive pulmonary disease) (HCC)   HTN (hypertension)   Lumbar stenosis with neurogenic claudication   Chronic lumbar pain   COVID-19 virus infection      Past Medical History:  Diagnosis Date   ANA positive 01/2001   Arthritis    Arthritis 07/02/2013   ANA positive   Cancer ([place])    partial hysterectomy age 69-uterine cancer   GERD (gastroesophageal reflux disease)    Hx of tobacco use, presenting hazards to health 07/02/2013   Hypercholesteremia    Hypertension    Ileus, postoperative ([place]) 07/02/2013   Osteoporosis    Pneumonia    hx of   SBO (small bowel obstruction) ([place]) 06/20/2103    Past Surgical History:  Procedure Laterality Date   ABDOMINAL HYSTERECTOMY     ANTERIOR LAT LUMBAR FUSION N/A 06/08/2014   Procedure: LUMBAR 2-3 LUMBAR 3-4 LUMBAR 4-5 ANTEROLATERAL DECOMPRESSION/FUSION WITH  PERCUTANEOUS PEDICLE SCREWS;  Surgeon: Kristeen Miss, MD;  [facility];  Service: Neurosurgery;  Laterality: N/A;  L2-3 L3-4 L4-5 ANTEROLATERAL DECOMPRESSION/FUSION WITH PERCUTANEOUS PEDICLE SCREWS   ANTERIOR LAT LUMBAR FUSION Left 01/07/2019   Procedure: Lumbar one-two Anterolateral lumbar interbody fusion with lateral plate fixation;  Surgeon: Kristeen Miss, MD;  Location: [facility];  Service: Neurosurgery;  Laterality: Left;   BACK SURGERY     BOWEL RESECTION N/A 06/20/2013   Procedure: SMALL BOWEL RESECTION;  Surgeon: Harl Bowie, MD;  Location: [facility];  Service: General;  Laterality: N/A;   COLON SURGERY     COLONOSCOPY  10/25/2011   Procedure: COLONOSCOPY;  Surgeon: Rogene Houston, MD;  [facility];  Service: Endoscopy;  Laterality: N/A;  930   COLONOSCOPY N/A 05/09/2017   Procedure: COLONOSCOPY;  Surgeon: Rogene Houston, MD;  [facility];  Service: Endoscopy;  Laterality: N/A;  1030   ESOPHAGEAL DILATION N/A 05/25/2015   Procedure: ESOPHAGEAL DILATION;  Surgeon: Rogene Houston, MD;  [facility];  Service: Endoscopy;  Laterality: N/A;   ESOPHAGOGASTRODUODENOSCOPY  02/2009   ESOPHAGOGASTRODUODENOSCOPY N/A 05/25/2015   Procedure: ESOPHAGOGASTRODUODENOSCOPY (EGD);  Surgeon: Rogene Houston,  MD;  [facility];  Service: Endoscopy;  Laterality: N/A;   HERNIA REPAIR Left    groin   INGUINAL HERNIA REPAIR Left 09/11/2013   Procedure: HERNIA REPAIR INGUINAL INCARCERATED;  Surgeon: Harl Bowie, MD;  Location: [facility];  Service: General;  Laterality: Left;   LAPAROTOMY N/A 06/20/2013   Procedure: EXPLORATORY LAPAROTOMY;  Surgeon: Harl Bowie, MD;  Location: [facility];  Service: General;  Laterality: N/A;   LUMBAR PERCUTANEOUS PEDICLE SCREW 3 LEVEL N/A 06/08/2014   Procedure: LUMBAR PERCUTANEOUS PEDICLE SCREW 3 LEVEL;  Surgeon: Kristeen Miss, MD;  [facility];  Service: Neurosurgery;  Laterality: N/A;   POLYPECTOMY   05/09/2017   Procedure: POLYPECTOMY;  Surgeon: Rogene Houston, MD;  [facility];  Service: Endoscopy;;  sigmoid colon   TONSILLECTOMY       HPI  from the history and physical done on the day of admission:    Patient coming from: Home   I have personally briefly reviewed patient's old medical records in [place]   Chief Complaint: Abdominal pain   HPI: Terri Harris is a 80 y.o. female with medical history significant for COPD, small bowel obstruction, hypertension. Patient presented to the ED with complaints of abdominal pain that started when she tested positive for COVID about 4 weeks ago.  She had some mild cough, but mostly congestion.  She had some mild nausea vomiting and diarrhea.  Most of the symptoms resolved.  But today she reports multiple episodes of dry heaving and retching, and spitting of yellowish liquid.  She reports abdominal bloating.  Her last bowel movement was 3 days ago.  She has taken Dulcolax to help with bowel movements but this did not help.   ED Course: Stable vitals.  WBC 16.3.  Abdominal CT shows small bowel obstruction with transition point in right upper abdomen at the level of the small bowel resection anastomosis. EDP talked to Dr. Arnoldo Morale, recommended NG tube will see in the morning.   Review of Systems: As per HPI all other systems reviewed and negative.     Hospital Course:     Brief Narrative:  80 year old married female with medical history significant for COPD not on home oxygen, SBO, hypertension, GERD, former tobacco use, HLD, abdominal hysterectomy, small bowel resection in 2015, recent COVID-19 infection about 4 weeks prior to admission, [facility] ED on 03[facility]28/2021 with complaints of abdominal pain.  Last BM 3 days PTA.  Admitted for small bowel obstruction.  General surgery consulted, currently managing conservatively.   Assessment & Plan:  Principal Problem:   SBO (small bowel obstruction)  (HCC) Active Problems:   COPD (chronic obstructive pulmonary disease) (HCC)   HTN (hypertension)   COVID-19 virus infection   A/p 1)Partial Small Bowel Obstruction - CT A/P 03/28/21: SBO with transition point in the right upper abdomen at the level of the small bowel resection anastomosis.  No findings to suggest bowel ischemia. - Prior history of multiple abdominal surgeries.  Thereby concern for mechanical SBO related to adhesions remains. - General surgery input appreciated.  Discussed with Dr. Arnoldo Morale.  small bowel follow-through with Gastrografin on 03/30/2021 is consistent with resolving SBO -- NG tube removed later on 03/31/2021 -Patient is passing gas and having small BMs,  -Overall clinically improved -She is tolerating oral intake w/o nausea, vomiting or increased abd pain Abd xray shows no further bowel obstruction -Discharge home on 04/03/2021 if tolerating full liquid/soft  diet   2)Recent COVID-19 infection - Tested positive on 03/03/2021.  Reportedly completed 5-day course of an antiviral medication.  Reportedly has also received 3 doses of the Moderna vaccine. -Elevated D-dimer and CRP noted -Repeat COVID-19 test is positive on 03/28/2021 --- CT abdomen shows patchy airspace opacities in the lung bases which could represent infection or inflammation.  These may be residual findings from earlier COVID-19 infection. - Patient lacks any acute respiratory symptoms apart from being hypoxic.   - 3)Hypoxia - May be due to recent COVID-19 infection. - No concern for current acute infection. - Supportive management with oxygen, incentive spirometry and wean as tolerated. --Hypoxia has resolved patient has been weaned off O2 Ambulated in hallways and oxygen sats stayed WNL --Repeat chest x-ray in 4 to 6 weeks--   Leukocytosis - Likely stress margination. -WBC normalized  COPD -No acute exacerbation at this time - Former smoker and quit 15 to 20 years ago.  Essential  hypertension -resume amlodipine-   GERD --Protonix as ordered   Hypokalemia--- replaced, magnesium and phosphorus WNL   Body mass index is 19.11 kg/m.     Code Status: Full Code Family Communication: Discussed with patient daughter at bedside    disposition: home     Consultants:   General surgery.   Procedures:   NG tube 10/31. Discharge Condition: stable  Follow UP   Follow-up Information     Rehman, Mechele Dawley, MD. Schedule an appointment as soon as possible for a visit in 1 week(s).   Specialty: Gastroenterology Contact information: [place], [address] [place]  [address] [phone]                 Diet and Activity recommendation:  As advised  Discharge Instructions    Discharge Instructions     Call MD for:  difficulty breathing, headache or visual disturbances   Complete by: As directed    Call MD for:  persistant dizziness or light-headedness   Complete by: As directed    Call MD for:  persistant nausea and vomiting   Complete by: As directed    Call MD for:  temperature >100.4   Complete by: As directed    Diet - low sodium heart healthy   Complete by: As directed    Discharge instructions   Complete by: As directed    1)Avoid ibuprofen/Advil/Aleve/Motrin/Goody Powders/Naproxen/BC powders/Meloxicam/Diclofenac/Indomethacin and other Nonsteroidal anti-inflammatory medications as these will make you more likely to bleed and can cause stomach ulcers, can also cause Kidney problems.   2)Soft Diet Advised, Avoid Bread/Pizza and Hard to Digest Foods----   3)Follow up with Dr. Laural Golden Gastroenterologist---in 1 to 2 weeks for recheck   Increase activity slowly   Complete by: As directed        Discharge Medications     Allergies as of 04/03/2021       Reactions   Codeine Nausea And Vomiting   Fosamax [alendronate Sodium] Other (See Comments)   gastritis        Medication List     TAKE these medications    acetaminophen 325  MG tablet Commonly known as: TYLENOL Take 2 tablets (650 mg total) by mouth every 6 (six) hours as needed for mild pain (or Fever >/= 101). What changed:  medication strength how much to take reasons to take this   alum & mag hydroxide-simeth 200-200-20 MG/5ML suspension Commonly known as: MAALOX/MYLANTA Take 15 mLs by mouth every 6 (six) hours as needed for indigestion or  heartburn.   amLODipine 5 MG tablet Commonly known as: NORVASC Take 1 tablet (5 mg total) by mouth daily.   cyanocobalamin 1000 MCG tablet Take 1,000 mcg by mouth daily.   diphenhydrAMINE 25 MG tablet Commonly known as: BENADRYL Take 25 mg by mouth every 6 (six) hours as needed.   oxyCODONE-acetaminophen 5-325 MG tablet Commonly known as: PERCOCET/ROXICET 1 qid prn What changed: Another medication with the same name was removed. Continue taking this medication, and follow the directions you see here.   pantoprazole 40 MG tablet Commonly known as: PROTONIX Take 1 tablet (40 mg total) by mouth 2 (two) times daily before a meal. What changed: when to take this   polyethylene glycol 17 g packet Commonly known as: MIRALAX / GLYCOLAX Take 17 g by mouth daily.   zoledronic acid 5 MG/100ML Soln injection Commonly known as: RECLAST Inject 5 mg into the vein once.        Major procedures and Radiology Reports - PLEASE review detailed and final reports for all details, in brief -    CT ABDOMEN PELVIS WO CONTRAST  Result Date: 03/29/2021 CLINICAL DATA:  Small-bowel obstruction with increased pain. EXAM: CT ABDOMEN AND PELVIS WITHOUT CONTRAST TECHNIQUE: Multidetector CT imaging of the abdomen and pelvis was performed following the standard protocol without IV contrast. COMPARISON:  CTs with contrast, yesterday at 3:41 p.m. and 12/28/2014 FINDINGS: Lower chest: Emphysematous and chronic scarring changes in the lung bases are present with scattered peripheral opacities consistent with mult lobar pneumonia.  Hepatobiliary: No focal liver abnormality is seen. No gallstones, gallbladder wall thickening, or biliary dilatation. There is contrast in gallbladder and common bile duct. Pancreas: Unremarkable. No pancreatic ductal dilatation or surrounding inflammatory changes. Spleen: Normal in size without focal abnormality. Adrenals/Urinary Tract: There is mild residual contrast enhancement in the bilateral kidneys. The should no longer be seen. Please correlate clinically for potential contrast nephrotoxicity. There is contrast in the collecting systems and in portions of the ureters, and in the unremarkable urinary bladder. No adrenal mass is seen. Stomach/Bowel: Interval NGT insertion with the tip in the body of stomach directed left. Generalized small bowel dilatation is similar to yesterday's study up to 4.2 cm in diameter and appears to transition at the level of a small bowel resection with reanastomosis in the right upper abdomen. The colon is decompressed. No evidence of colitis or diverticulitis. The appendix could not be seen. Vascular/Lymphatic: There is extensive aortoiliac calcified plaque without AAA. The abdominal aorta is tortuous. No abdominal or pelvic adenopathy seen. Reproductive: The uterus is absent.  No adnexal mass is seen. Other: There is a left flank wall hernia containing a nonobstructed short loop of the descending colon. There is minimal ascites in the upper abdomen, left paracolic gutter and trace pelvic ascites increased. Musculoskeletal: There are degenerative and postsurgical changes lumbar spine, with moderate metallic artifact and asymmetric left hip DJD. IMPRESSION: 1. Small-bowel obstruction with dilated small bowel up to 4.2 cm, and the transition again appears to be in the heavy upper abdomen at a site of small bowel resection and reanastomosis. 2. No bowel pneumatosis or free air are observed. There is increased minimal abdominal and pelvic ascites. 3. Persisting contrast enhancement  bilateral renal cortex, should not be seen 11 hours after the prior contrast bolus. Please correlate clinically for patient dehydration or contrast nephrotoxicity. 4. Interval enteric tube placement with tip in the body of stomach directed left. 5. Scattered peripheral opacities in the lower lung fields in the  setting of COPD, most likely reflecting multilobar pneumonia. 6. Extensive aortoiliac calcification and additional findings discussed above. Electronically Signed   By: Telford Nab M.D.   On: 03/29/2021 03:32   DG Abd 1 View  Result Date: 03/31/2021 CLINICAL DATA:  Small-bowel obstruction.  8 hour delay EXAM: ABDOMEN - 1 VIEW COMPARISON:  Flat plate abdomen film yesterday at 11:51 a.m. FINDINGS: NGT tip remains at the level of the mid body of stomach. Small bowel dilatation continues to improve, currently with maximal small bowel caliber of 3.1 cm in the pelvis, previously 3.6 cm. There is scattered contrast in the colon and pelvic small bowel. There is osteopenia, degenerative and postsurgical changes of the lumbar spine and asymmetric bone-on-bone DJD at the left hip. IMPRESSION: Continued improvement in small bowel dilatation. Electronically Signed   By: Telford Nab M.D.   On: 03/31/2021 01:37   CT ABDOMEN PELVIS W CONTRAST  Result Date: 03/28/2021 CLINICAL DATA:  Nonlocalized acute abdominal pain. States she had covid the beginning of the month has had general abdominal pain/bloating ever since. Hx of uterine cancer, HTN, SBO, hysterectomy, bowel resection, hernia repair. EXAM: CT ABDOMEN AND PELVIS WITH CONTRAST TECHNIQUE: Multidetector CT imaging of the abdomen and pelvis was performed using the standard protocol following bolus administration of intravenous contrast. CONTRAST:  57mL OMNIPAQUE IOHEXOL 300 MG/ML  SOLN COMPARISON:  CT abdomen pelvis 12/28/2014. FINDINGS: Lower chest: Patchy airspace opacities of the lung bases. Centrilobular emphysematous changes. Hepatobiliary: No focal  liver abnormality. No gallstones, gallbladder wall thickening, or pericholecystic fluid. No biliary dilatation. Pancreas: No focal lesion. Normal pancreatic contour. No surrounding inflammatory changes. No main pancreatic ductal dilatation. Spleen: Normal in size without focal abnormality. Adrenals/Urinary Tract: No adrenal nodule bilaterally. Bilateral kidneys enhance symmetrically. Subcentimeter hypodensities are too small to characterize. No hydronephrosis. No hydroureter. The urinary bladder is unremarkable. On delayed imaging, there is no urothelial wall thickening and there are no filling defects in the opacified portions of the bilateral collecting systems or ureters. Stomach/Bowel: Fluid dilatation of the small bowel with a transition point in the right upper abdomen the level of a small bowel resection anastomosis. Stomach is within normal limits. No evidence of bowel wall thickening or dilatation. The distal bowel and majority of the colon are decompressed. The appendix is not definitely identified. Vascular/Lymphatic: No abdominal aorta or iliac aneurysm. Severe atherosclerotic plaque of the aorta and its branches. No abdominal, pelvic, or inguinal lymphadenopathy. Reproductive: Status post hysterectomy. No adnexal masses. Other: No intraperitoneal free fluid. No intraperitoneal free gas. No organized fluid collection. Musculoskeletal: No abdominal wall hernia or abnormality. No suspicious lytic or blastic osseous lesions. No acute displaced fracture. Multilevel degenerative changes of the spine in a patient with L1 through L5 posterior and interbody fusion. IMPRESSION: 1. Small-bowel obstruction with transition point in the right upper abdomen at the level of the small bowel resection anastomosis. No associated bowel obstruction or findings to suggest bowel ischemia. 2. Patchy airspace opacities of the lung bases which could represent infection/inflammation. Recommend chest x-ray for further evaluation.  3.  Aortic Atherosclerosis (ICD10-I70.0) - severe 4.  Emphysema (ICD10-J43.9). Electronically Signed   By: Iven Finn M.D.   On: 03/28/2021 16:08   DG Chest Portable 1 View  Result Date: 03/29/2021 CLINICAL DATA:  Nasogastric tube placement EXAM: PORTABLE CHEST 1 VIEW COMPARISON:  03/28/2021 FINDINGS: Nasogastric tube extends into the left upper quadrant of the abdomen beyond the margin of the examination. The proximal side hole of the catheter overlies the  gastric fundus, unchanged from prior examination. Lungs appear mildly hyperinflated. Patchy infiltrate noted within the left lung base, possibly infectious or inflammatory in nature. The abdomen is largely excluded from view. IMPRESSION: Nasogastric tube appears unchanged extending into the left upper quadrant of the abdomen. Electronically Signed   By: Fidela Salisbury M.D.   On: 03/29/2021 01:15   DG Chest Portable 1 View  Result Date: 03/28/2021 CLINICAL DATA:  Nasogastric tube placement EXAM: PORTABLE CHEST 1 VIEW COMPARISON:  None. FINDINGS: Nasogastric tube tip overlies the expected proximal body of the stomach within the left upper quadrant of the abdomen. Lungs appear mildly hyperinflated. Patchy left basilar infiltrate is present, possibly infectious or inflammatory in nature. Multiple dilated gas-filled loops of small bowel are seen within the left upper quadrant of the abdomen in keeping with the known small-bowel obstruction seen on CT examination performed at 3:39 p.m. IMPRESSION: Nasogastric tube tip within the left upper quadrant of the abdomen overlying the proximal body of the stomach. Electronically Signed   By: Fidela Salisbury M.D.   On: 03/28/2021 19:19   DG ABD ACUTE 2+V W 1V CHEST  Result Date: 04/02/2021 CLINICAL DATA:  Abdominal pain constipation EXAM: DG ABDOMEN ACUTE WITH 1 VIEW CHEST COMPARISON:  03/31/2021 FINDINGS: COPD. Lungs are clear without infiltrate or effusion. Heart size and vascularity normal Normal bowel gas  pattern. No obstruction or free air. No urinary tract calculi. Prior back surgery. IMPRESSION: COPD without acute cardiopulmonary abnormality Nonobstructive bowel gas pattern. Electronically Signed   By: Franchot Gallo M.D.   On: 04/02/2021 17:39   DG Abd Portable 1V-Small Bowel Obstruction Protocol-initial, 8 hr delay  Result Date: 03/30/2021 CLINICAL DATA:  Small-bowel obstruction EXAM: PORTABLE ABDOMEN - 1 VIEW COMPARISON:  03/29/2021 FINDINGS: Nasogastric tube tip remains in the mid body of the stomach. There is improvement with less dilatation of the small intestine. There may be mild residual distal small bowel dilatation but the situation is markedly improved. No worsening or new finding. No evidence of perforation. IMPRESSION: Improved gas pattern, with less dilatation of the small intestine, consistent with improved/resolving small-bowel obstruction. Electronically Signed   By: Nelson Chimes M.D.   On: 03/30/2021 12:45    Micro Results   Recent Results (from the past 240 hour(s))  Resp Panel by RT-PCR (Flu A&B, Covid) Nasopharyngeal Swab     Status: Abnormal   Collection Time: 03/28/21  5:12 PM   Specimen: Nasopharyngeal Swab; Nasopharyngeal(NP) swabs in vial transport medium  Result Value Ref Range Status   SARS Coronavirus 2 by RT PCR POSITIVE (A) NEGATIVE Final    Comment: RESULT CALLED TO, READ BACK BY AND VERIFIED WITH: B OAKLEY RN [email or web address] [id number] K FORSYTH (NOTE) SARS-CoV-2 target nucleic acids are DETECTED.  The SARS-CoV-2 RNA is generally detectable in upper respiratory specimens during the acute phase of infection. Positive results are indicative of the presence of the identified virus, but do not rule out bacterial infection or co-infection with other pathogens not detected by the test. Clinical correlation with patient history and other diagnostic information is necessary to determine patient infection status. The expected result is Negative.  Fact Sheet for  Patients: EntrepreneurPulse.com.au  Fact Sheet for Healthcare Providers: IncredibleEmployment.be  This test is not yet approved or cleared by the [place] FDA and  has been authorized for detection and/or diagnosis of SARS-CoV-2 by FDA under an Emergency Use Authorization (EUA).  This EUA will remain in effect (meaning this test can be  used) for the duration  of  the COVID-19 declaration under Section 564(b)(1) of the Act, 21 U.S.C. section 360bbb-3(b)(1), unless the authorization is terminated or revoked sooner.     Influenza A by PCR NEGATIVE NEGATIVE Final   Influenza B by PCR NEGATIVE NEGATIVE Final    Comment: (NOTE) The Xpert Xpress SARS-CoV-2/FLU/RSV plus assay is intended as an aid in the diagnosis of influenza from Nasopharyngeal swab specimens and should not be used as a sole basis for treatment. Nasal washings and aspirates are unacceptable for Xpert Xpress SARS-CoV-2/FLU/RSV testing.  Fact Sheet for Patients: EntrepreneurPulse.com.au  Fact Sheet for Healthcare Providers: IncredibleEmployment.be  This test is not yet approved or cleared by the [place] FDA and has been authorized for detection and/or diagnosis of SARS-CoV-2 by FDA under an Emergency Use Authorization (EUA). This EUA will remain in effect (meaning this test can be used) for the duration of the COVID-19 declaration under Section 564(b)(1) of the Act, 21 U.S.C. section 360bbb-3(b)(1), unless the authorization is terminated or revoked.  Performed at [facility], [address], [place], [place] [address]     Today   Subjective    Sumayya Muha today has no new concerns,  Husband and daughter at bedside Had BMs and she is passing [place] Eating and drinking well---        Patient has been seen and examined prior to discharge   Objective   Blood pressure 117/61, pulse 72, temperature 98.4 F (36.9 C), temperature  source Oral, resp. rate 18, height 5\' 5"  (1.651 m), weight 52.1 kg, SpO2 96 %.   Intake/Output Summary (Last 24 hours) at 04/03/2021 1114 Last data filed at 04/03/2021 0900 Gross per 24 hour  Intake 540 ml  Output --  Net 540 ml    Exam Gen:- Awake Alert, no acute distress  HEENT:- [place].AT, No sclera icterus Neck-Supple Neck,No JVD,.  Lungs-  CTAB , good air movement bilaterally  CV- S1, S2 normal, regular Abd-  +ve B.Sounds, Abd Soft, No tenderness,  healed abdominal scars Extremity/Skin:- No  edema,   good pulses Psych-affect is appropriate, oriented x3 Neuro-no new focal deficits, no tremors    Data Review   CBC w Diff:  Lab Results  Component Value Date   WBC 7.1 03/30/2021   HGB 13.3 03/30/2021   HGB 15.4 09/18/2019   HCT 42.4 03/30/2021   HCT 45.0 09/18/2019   PLT 248 03/30/2021   PLT 313 09/18/2019   LYMPHOPCT 25 12/28/2014   MONOPCT 12 12/28/2014   EOSPCT 4 12/28/2014   BASOPCT 1 12/28/2014    CMP:  Lab Results  Component Value Date   NA 137 04/02/2021   NA 140 08/30/2020   K 4.5 04/02/2021   CL 107 04/02/2021   CO2 25 04/02/2021   BUN <5 (L) 04/02/2021   BUN 9 08/30/2020   CREATININE 0.42 (L) 04/02/2021   PROT 8.9 (H) 03/28/2021   PROT 8.5 09/18/2019   ALBUMIN 3.1 (L) 04/02/2021   ALBUMIN 5.4 (H) 09/18/2019   BILITOT 0.5 03/28/2021   BILITOT 0.7 09/18/2019   ALKPHOS 97 03/28/2021   AST 28 03/28/2021   ALT 15 03/28/2021  .   Total Discharge time is about 33 minutes  Roxan Hockey M.D on 04/03/2021 at 11:14 AM  Go to [email or web address] -  for contact info  [facility] - Office  [phone]

## 2021-04-03 NOTE — Progress Notes (Signed)
Patient discharged to home with instructions  She verbalized understanding of all information

## 2021-04-04 ENCOUNTER — Other Ambulatory Visit: Payer: Self-pay

## 2021-04-04 DIAGNOSIS — I10 Essential (primary) hypertension: Secondary | ICD-10-CM

## 2021-04-04 DIAGNOSIS — J449 Chronic obstructive pulmonary disease, unspecified: Secondary | ICD-10-CM

## 2021-04-05 ENCOUNTER — Telehealth: Payer: Self-pay | Admitting: *Deleted

## 2021-04-05 NOTE — Chronic Care Management (AMB) (Signed)
  Chronic Care Management   Note  04/05/2021 Name: Terri Harris MRN: [id number] DOB: Feb 13, 1941  Terri Harris is a 80 y.o. year old female who is a primary care patient of Luking, Elayne Snare, MD. I reached out to [name] by phone today in response to a referral sent by Ms. [name]'s PCP.  Ms. Nipper was given information about Chronic Care Management services today including:  CCM service includes personalized support from designated clinical staff supervised by her physician, including individualized plan of care and coordination with other care providers 24/7 contact phone numbers for assistance for urgent and routine care needs. Service will only be billed when office clinical staff spend 20 minutes or more in a month to coordinate care. Only one practitioner may furnish and bill the service in a calendar month. The patient may stop CCM services at any time (effective at the end of the month) by phone call to the office staff. The patient is responsible for co-pay (up to 20% after annual deductible is met) if co-pay is required by the individual health plan.   Patient agreed to services and verbal consent obtained.   Follow up plan: Telephone appointment with care management team member scheduled for:04/20/21  [facility]  Direct Dial: [phone]

## 2021-04-20 ENCOUNTER — Ambulatory Visit (INDEPENDENT_AMBULATORY_CARE_PROVIDER_SITE_OTHER): Payer: PPO | Admitting: *Deleted

## 2021-04-20 DIAGNOSIS — I10 Essential (primary) hypertension: Secondary | ICD-10-CM

## 2021-04-20 DIAGNOSIS — J449 Chronic obstructive pulmonary disease, unspecified: Secondary | ICD-10-CM

## 2021-04-20 NOTE — Patient Instructions (Addendum)
Visit Information   Thank you for taking time to visit with me today. Please don't hesitate to contact me if I can be of assistance to you before our next scheduled telephone appointment.  Following are the goals we discussed today:  (Copy and paste patient goals from clinical care plan here)  Our next appointment is by telephone on 06/29/2021 at 330 pm  Please call the care guide team at [phone] if you need to cancel or reschedule your appointment.   Please call [phone]-TALK (toll free, 24 hour hotline) go to [facility] [address], [place] [phone]) call the [place]: [phone] call 911  [place] stands for Dietary Approaches to Stop Hypertension. The DASH eating plan is a healthy eating plan that has been shown to: Reduce high blood pressure (hypertension). Reduce your risk for type 2 diabetes, heart disease, and stroke. Help with weight loss. What are tips for following this plan? Reading food labels Check food labels for the amount of salt (sodium) per serving. Choose foods with less than 5 percent of the Daily Value of sodium. Generally, foods with less than 300 milligrams (mg) of sodium per serving fit into this eating plan. To find whole grains, look for the word "whole" as the first word in the ingredient list. Shopping Buy products labeled as "low-sodium" or "no salt added." Buy fresh foods. Avoid canned foods and pre-made or frozen meals. Cooking Avoid adding salt when cooking. Use salt-free seasonings or herbs instead of table salt or sea salt. Check with your health care provider or pharmacist before using salt substitutes. Do not fry foods. Cook foods using healthy methods such as baking, boiling, grilling, roasting, and broiling instead. Cook with heart-healthy oils, such as olive, canola, avocado, soybean, or sunflower oil. Meal planning  Eat a balanced diet that includes: 4 or  more servings of fruits and 4 or more servings of vegetables each day. Try to fill one-half of your plate with fruits and vegetables. 6-8 servings of whole grains each day. Less than 6 oz (170 g) of lean meat, poultry, or fish each day. A 3-oz (85-g) serving of meat is about the same size as a deck of cards. One egg equals 1 oz (28 g). 2-3 servings of low-fat dairy each day. One serving is 1 cup (237 mL). 1 serving of nuts, seeds, or beans 5 times each week. 2-3 servings of heart-healthy fats. Healthy fats called omega-3 fatty acids are found in foods such as walnuts, flaxseeds, fortified milks, and eggs. These fats are also found in cold-water fish, such as sardines, salmon, and mackerel. Limit how much you eat of: Canned or prepackaged foods. Food that is high in trans fat, such as some fried foods. Food that is high in saturated fat, such as fatty meat. Desserts and other sweets, sugary drinks, and other foods with added sugar. Full-fat dairy products. Do not salt foods before eating. Do not eat more than 4 egg yolks a week. Try to eat at least 2 vegetarian meals a week. Eat more home-cooked food and less restaurant, buffet, and fast food. Lifestyle When eating at a restaurant, ask that your food be prepared with less salt or no salt, if possible. If you drink alcohol: Limit how much you use to: 0-1 drink a day for women who are not pregnant. 0-2 drinks a day for men. Be aware of how much alcohol is in your drink. In the [place] one drink equals  one 12 oz bottle of beer (355 mL), one 5 oz glass of wine (148 mL), or one 1 oz glass of hard liquor (44 mL). General information Avoid eating more than 2,300 mg of salt a day. If you have hypertension, you may need to reduce your sodium intake to 1,500 mg a day. Work with your health care provider to maintain a healthy body weight or to lose weight. Ask what an ideal weight is for you. Get at least 30 minutes of exercise that causes your heart to  beat faster (aerobic exercise) most days of the week. Activities may include walking, swimming, or biking. Work with your health care provider or dietitian to adjust your eating plan to your individual calorie needs. What foods should I eat? Fruits All fresh, dried, or frozen fruit. Canned fruit in natural juice (without added sugar). Vegetables Fresh or frozen vegetables (raw, steamed, roasted, or grilled). Low-sodium or reduced-sodium tomato and vegetable juice. Low-sodium or reduced-sodium tomato sauce and tomato paste. Low-sodium or reduced-sodium canned vegetables. Grains Whole-grain or whole-wheat bread. Whole-grain or whole-wheat pasta. Brown rice. [place]. Bulgur. Whole-grain and low-sodium cereals. Pita bread. Low-fat, low-sodium crackers. Whole-wheat flour tortillas. Meats and other proteins Skinless chicken or [place]. Ground chicken or [place]. Pork with fat trimmed off. Fish and seafood. Egg whites. Dried beans, peas, or lentils. Unsalted nuts, nut butters, and seeds. Unsalted canned beans. Lean cuts of beef with fat trimmed off. Low-sodium, lean precooked or cured meat, such as sausages or meat loaves. Dairy Low-fat (1%) or fat-free (skim) milk. Reduced-fat, low-fat, or fat-free cheeses. Nonfat, low-sodium ricotta or cottage cheese. Low-fat or nonfat yogurt. Low-fat, low-sodium cheese. Fats and oils Soft margarine without trans fats. Vegetable oil. Reduced-fat, low-fat, or light mayonnaise and salad dressings (reduced-sodium). Canola, safflower, olive, avocado, soybean, and sunflower oils. Avocado. Seasonings and condiments Herbs. Spices. Seasoning mixes without salt. Other foods Unsalted popcorn and pretzels. Fat-free sweets. The items listed above may not be a complete list of foods and beverages you can eat. Contact a dietitian for more information. What foods should I avoid? Fruits Canned fruit in a light or heavy syrup. Fried fruit. Fruit in cream or butter  sauce. Vegetables Creamed or fried vegetables. Vegetables in a cheese sauce. Regular canned vegetables (not low-sodium or reduced-sodium). Regular canned tomato sauce and paste (not low-sodium or reduced-sodium). Regular tomato and vegetable juice (not low-sodium or reduced-sodium). Angie Fava. Olives. Grains Baked goods made with fat, such as croissants, muffins, or some breads. Dry pasta or rice meal packs. Meats and other proteins Fatty cuts of meat. Ribs. Fried meat. Berniece Salines. Bologna, salami, and other precooked or cured meats, such as sausages or meat loaves. Fat from the back of a pig (fatback). Bratwurst. Salted nuts and seeds. Canned beans with added salt. Canned or smoked fish. Whole eggs or egg yolks. Chicken or [place] with skin. Dairy Whole or 2% milk, cream, and half-and-half. Whole or full-fat cream cheese. Whole-fat or sweetened yogurt. Full-fat cheese. Nondairy creamers. Whipped toppings. Processed cheese and cheese spreads. Fats and oils Butter. Stick margarine. Lard. Shortening. Ghee. Bacon fat. Tropical oils, such as coconut, palm kernel, or palm oil. Seasonings and condiments Onion salt, garlic salt, seasoned salt, table salt, and sea salt. Worcestershire sauce. Tartar sauce. Barbecue sauce. Teriyaki sauce. Soy sauce, including reduced-sodium. Steak sauce. Canned and packaged gravies. Fish sauce. Oyster sauce. Cocktail sauce. Store-bought horseradish. Ketchup. Mustard. Meat flavorings and tenderizers. Bouillon cubes. Hot sauces. Pre-made or packaged marinades. Pre-made or packaged taco seasonings. Relishes. Regular salad  dressings. Other foods Salted popcorn and pretzels. The items listed above may not be a complete list of foods and beverages you should avoid. Contact a dietitian for more information. Where to find more information National Heart, Lung, and Blood Institute: https://wilson-eaton.com/ American Heart Association: www.heart.org Academy of Nutrition and Dietetics:  www.eatright.[place]: www.kidney.org Summary The DASH eating plan is a healthy eating plan that has been shown to reduce high blood pressure (hypertension). It may also reduce your risk for type 2 diabetes, heart disease, and stroke. When on the DASH eating plan, aim to eat more fresh fruits and vegetables, whole grains, lean proteins, low-fat dairy, and heart-healthy fats. With the DASH eating plan, you should limit salt (sodium) intake to 2,300 mg a day. If you have hypertension, you may need to reduce your sodium intake to 1,500 mg a day. Work with your health care provider or dietitian to adjust your eating plan to your individual calorie needs. This information is not intended to replace advice given to you by your health care provider. Make sure you discuss any questions you have with your health care provider. Document Revised: 04/18/2019 Document Reviewed: 04/18/2019 Elsevier Patient Education  2022 [place]. COPD Action Plan A COPD action plan is a description of what to do when you have a flare (exacerbation) of chronic obstructive pulmonary disease (COPD). Your action plan is a color-coded plan that lists the symptoms that indicate whether your condition is under control and what actions to take. If you have symptoms in the green zone, it means you are doing well that day. If you have symptoms in the yellow zone, it means you are having a bad day or an exacerbation. If you have symptoms in the red zone, you need urgent medical care. Follow the plan that you and your health care provider developed. Review your plan with your health care provider at each visit. Red zone Symptoms in this zone mean that you should get medical help right away. They include: Feeling very short of breath, even when you are resting. Not being able to do any activities because of poor breathing. Not being able to sleep because of poor breathing. Fever or shaking chills. Feeling  confused or very sleepy. Chest pain. Coughing up blood. If you have any of these symptoms, call emergency services (911 in the [place] or go to the nearest emergency room. Yellow zone Symptoms in this zone mean that your condition may be getting worse. They include: Feeling more short of breath than usual. Having less energy for daily activities than usual. Phlegm or mucus that is thicker than usual. Needing to use your rescue inhaler or nebulizer more often than usual. More ankle swelling than usual. Coughing more than usual. Feeling like you have a chest cold. Trouble sleeping due to COPD symptoms. Decreased appetite. COPD medicines not helping as much as usual. If you experience any "yellow" symptoms: Keep taking your daily medicines as directed. Use your quick-relief inhaler as told by your health care provider. If you were prescribed steroid medicine to take by mouth (oral medicine), start taking it as told by your health care provider. If you were prescribed an antibiotic medicine, start taking it as told by your health care provider. Do not stop taking the antibiotic even if you start to feel better. Use oxygen as told by your health care provider. Get more rest. Do your pursed-lip breathing exercises. Do not smoke. Avoid any irritants in the air. If your signs and symptoms  do not improve after taking these steps, call your health care provider right away. Green zone Symptoms in this zone mean that you are doing well. They include: Being able to do your usual activities and exercise. Having the usual amount of coughing, including the same amount of phlegm or mucus. Being able to sleep well. Having a good appetite. Where to find more information: You can find more information about COPD from: American Lung Association, My COPD Action Plan: www.lung.org COPD Foundation: www.copdfoundation.[place]: https://wilson-eaton.com/ Follow these instructions  at home: Continue taking your daily medicines as told by your health care provider. Make sure you receive all the immunizations that your health care provider recommends, especially the pneumococcal and influenza vaccines. Wash your hands often with soap and water. Have family members wash their hands too. Regular hand washing can help prevent infections. Follow your usual exercise and diet plan. Avoid irritants in the air, such as smoke. Do not use any products that contain nicotine or tobacco. These products include cigarettes, chewing tobacco, and vaping devices, such as e-cigarettes. If you need help quitting, ask your health care provider. Summary A COPD action plan tells you what to do when you have a flare (exacerbation) of chronic obstructive pulmonary disease (COPD). Follow each action plan for your symptoms. If you have any symptoms in the red zone, call emergency services (911 in the [place] or go to the nearest emergency room. This information is not intended to replace advice given to you by your health care provider. Make sure you discuss any questions you have with your health care provider. Document Revised: 03/23/2020 Document Reviewed: 03/23/2020 Elsevier Patient Education  2022 [employer].  if you are experiencing a Mental Health or [place] or need someone to talk to.  Following is a copy of your full care plan:  Care Plan : RN Care Manager plan of care  Updates made by Kassie Mends, RN since 04/20/2021 12:00 AM     Problem: No plan of care established for management of chronic disease states (COPD, HTN)   Priority: High     Long-Range Goal: Development of plan of care for chronic disease management (COPD, HTN)   Start Date: 04/20/2021  Expected End Date: 10/17/2021  Priority: High  Note:   Current Barriers:  Knowledge Deficits related to plan of care for management of HTN and COPD - patient reports she lives with spouse and her adult daughter, has  blood pressure cuff but does not check blood pressure.  Does not have advanced directives and declines information today.  Patient needs education/ reinforcement for COPD/ HTN management, action plans and diet.  Patient hospitalized 03/28/21 for small bowel obstruction and reports " doing much better now"  RNCM Clinical Goal(s):  Patient will verbalize understanding of plan for management of HTN and COPD as evidenced by patient report, review EHR and  through collaboration with RN Care manager, provider, and care team.   Interventions: 1:1 collaboration with primary care provider regarding development and update of comprehensive plan of care as evidenced by provider attestation and co-signature Inter-disciplinary care team collaboration (see longitudinal plan of care) Evaluation of current treatment plan related to  self management and patient's adherence to plan as established by provider  Hypertension Interventions: Last practice recorded BP readings:  BP Readings from Last 3 Encounters:  04/03/21 117/61  12/06/20 127/84  09/21/20 137/74  Most recent eGFR/CrCl:  Lab Results  Component Value Date   EGFR  90 08/30/2020    No components found for: CRCL  Evaluation of current treatment plan related to hypertension self management and patient's adherence to plan as established by provider; Provided education to patient re: stroke prevention, s/s of heart attack and stroke; Reviewed medications with patient and discussed importance of compliance; Discussed plans with patient for ongoing care management follow up and provided patient with direct contact information for care management team; Discussed complications of poorly controlled blood pressure such as heart disease, stroke, circulatory complications, vision complications, kidney impairment, sexual dysfunction;  Education sent via mail- low sodium diet  COPD Interventions:   Provided patient with basic written and verbal COPD education  on self care/management/and exacerbation prevention; Advised patient to self assesses COPD action plan zone and make appointment with provider if in the yellow zone for 48 hours without improvement; Advised patient to engage in light exercise as tolerated 3-5 days a week to aid in the the management of COPD; Provided education about and advised patient to utilize infection prevention strategies to reduce risk of respiratory infection; Screening for signs and symptoms of depression related to chronic disease state;  Assessed social determinant of health barriers;   Education sent via mail- COPD action plan  Patient Goals/Self-Care Activities: Take medications as prescribed   Attend all scheduled provider appointments Perform all self care activities independently  Perform IADL's (shopping, preparing meals, housekeeping, managing finances) independently Call provider office for new concerns or questions  - limit outdoor activity during cold weather - do breathing exercises every day - develop a rescue plan - follow rescue plan if symptoms flare-up - keep follow-up appointments: 04/29/21 primary care provider,  05/19/21 Reclast IV med - get at least 7 to 8 hours of sleep at night check blood pressure 3 times per week choose a place to take my blood pressure (home, clinic or office, retail store) write blood pressure results in a log or diary take blood pressure log to all doctor appointments keep all doctor appointments take medications for blood pressure exactly as prescribed eat more whole grains, fruits and vegetables, lean meats and healthy fats Look over education sent via mail- COPD action plan and low sodium diet  Follow Up Plan:  Telephone follow up appointment with care management team member scheduled for:  06/29/2021      Consent to CCM Services: Terri Harris was given information about Chronic Care Management services including:  CCM service includes personalized support from  designated clinical staff supervised by her physician, including individualized plan of care and coordination with other care providers 24/7 contact phone numbers for assistance for urgent and routine care needs. Service will only be billed when office clinical staff spend 20 minutes or more in a month to coordinate care. Only one practitioner may furnish and bill the service in a calendar month. The patient may stop CCM services at any time (effective at the end of the month) by phone call to the office staff. The patient will be responsible for cost sharing (co-pay) of up to 20% of the service fee (after annual deductible is met).  Patient agreed to services and verbal consent obtained.   The patient verbalized understanding of instructions, educational materials, and care plan provided today and agreed to receive a mailed copy of patient instructions, educational materials, and care plan.   Telephone follow up appointment with care management team member scheduled for:  06/29/2021

## 2021-04-20 NOTE — Chronic Care Management (AMB) (Signed)
Chronic Care Management   CCM RN Visit Note  04/20/2021 Name: Terri Harris MRN: [id number] DOB: [birth year]  Subjective: Terri Harris is a 80 y.o. year old female who is a primary care patient of Luking, Elayne Snare, MD. The care management team was consulted for assistance with disease management and care coordination needs.    Engaged with patient by telephone for initial visit in response to provider referral for case management and/or care coordination services.   Consent to Services:  The patient was given the following information about Chronic Care Management services today, agreed to services, and gave verbal consent: 1. CCM service includes personalized support from designated clinical staff supervised by the primary care provider, including individualized plan of care and coordination with other care providers 2. 24/7 contact phone numbers for assistance for urgent and routine care needs. 3. Service will only be billed when office clinical staff spend 20 minutes or more in a month to coordinate care. 4. Only one practitioner may furnish and bill the service in a calendar month. 5.The patient may stop CCM services at any time (effective at the end of the month) by phone call to the office staff. 6. The patient will be responsible for cost sharing (co-pay) of up to 20% of the service fee (after annual deductible is met). Patient agreed to services and consent obtained.  Patient agreed to services and verbal consent obtained.   Assessment: Review of patient past medical history, allergies, medications, health status, including review of consultants reports, laboratory and other test data, was performed as part of comprehensive evaluation and provision of chronic care management services.   SDOH (Social Determinants of Health) assessments and interventions performed:  SDOH Interventions    Flowsheet Row Most Recent Value  SDOH Interventions   Food Insecurity Interventions Intervention Not  Indicated  Transportation Interventions Intervention Not Indicated        CCM Care Plan  Allergies  Allergen Reactions   Codeine Nausea And Vomiting   Fosamax [Alendronate Sodium] Other (See Comments)    gastritis    Outpatient Encounter Medications as of 04/20/2021  Medication Sig Note   acetaminophen (TYLENOL) 325 MG tablet Take 2 tablets (650 mg total) by mouth every 6 (six) hours as needed for mild pain (or Fever >/= 101).    alum & mag hydroxide-simeth (MAALOX/MYLANTA) 200-200-20 MG/5ML suspension Take 15 mLs by mouth every 6 (six) hours as needed for indigestion or heartburn.    amLODipine (NORVASC) 5 MG tablet Take 1 tablet (5 mg total) by mouth daily.    cyanocobalamin 1000 MCG tablet Take 1,000 mcg by mouth daily.    diphenhydrAMINE (BENADRYL) 25 MG tablet Take 25 mg by mouth every 6 (six) hours as needed.    oxyCODONE-acetaminophen (PERCOCET/ROXICET) 5-325 MG tablet 1 qid prn    pantoprazole (PROTONIX) 40 MG tablet Take 1 tablet (40 mg total) by mouth 2 (two) times daily before a meal.    polyethylene glycol (MIRALAX / GLYCOLAX) 17 g packet Take 17 g by mouth daily.    zoledronic acid (RECLAST) 5 MG/100ML SOLN injection Inject 5 mg into the vein once. 05/19/2020: Dose # 5 04/2021   No facility-administered encounter medications on file as of 04/20/2021.    Patient Active Problem List   Diagnosis Date Noted   COVID-19 virus infection 03/28/2021   Vitamin B12 deficiency 12/09/2019   Lumbar stenosis 01/07/2019   Hx of colonic polyps 12/22/2016   Nocturnal headaches 01/26/2016   Dysphagia 05/06/2015  Hyperlipidemia 04/19/2015   Chronic lumbar pain 04/19/2015   Lumbar stenosis with neurogenic claudication 06/08/2014   HTN (hypertension) 03/27/2014   Incarcerated femoral hernia 09/11/2013   Ileus, postoperative ([place]) 07/02/2013   GERD (gastroesophageal reflux disease) 07/02/2013   Hx of tobacco use, presenting hazards to health 07/02/2013   Arthritis 07/02/2013    SBO (small bowel obstruction) ([place]) 06/01/2013   Dehydration 06/01/2013   Gastritis 04/13/2013   Abdominal pain, unspecified site 04/13/2013   Osteoporosis 04/13/2013   Personal history of colonic polyps 04/13/2013   COPD (chronic obstructive pulmonary disease) ([place]) 04/13/2013    Conditions to be addressed/monitored:HTN and COPD  Care Plan : RN Care Manager plan of care  Updates made by Kassie Mends, RN since 04/20/2021 12:00 AM     Problem: No plan of care established for management of chronic disease states (COPD, HTN)   Priority: High     Long-Range Goal: Development of plan of care for chronic disease management (COPD, HTN)   Start Date: 04/20/2021  Expected End Date: 10/17/2021  Priority: High  Note:   Current Barriers:  Knowledge Deficits related to plan of care for management of HTN and COPD - patient reports she lives with spouse and her adult daughter, has blood pressure cuff but does not check blood pressure.  Does not have advanced directives and declines information today.  Patient needs education/ reinforcement for COPD/ HTN management, action plans and diet.  Patient hospitalized 03/28/21 for small bowel obstruction and reports " doing much better now"  RNCM Clinical Goal(s):  Patient will verbalize understanding of plan for management of HTN and COPD as evidenced by patient report, review EHR and  through collaboration with RN Care manager, provider, and care team.   Interventions: 1:1 collaboration with primary care provider regarding development and update of comprehensive plan of care as evidenced by provider attestation and co-signature Inter-disciplinary care team collaboration (see longitudinal plan of care) Evaluation of current treatment plan related to  self management and patient's adherence to plan as established by provider  Hypertension Interventions: Last practice recorded BP readings:  BP Readings from Last 3 Encounters:  04/03/21 117/61   12/06/20 127/84  09/21/20 137/74  Most recent eGFR/CrCl:  Lab Results  Component Value Date   EGFR 90 08/30/2020    No components found for: CRCL  Evaluation of current treatment plan related to hypertension self management and patient's adherence to plan as established by provider; Provided education to patient re: stroke prevention, s/s of heart attack and stroke; Reviewed medications with patient and discussed importance of compliance; Discussed plans with patient for ongoing care management follow up and provided patient with direct contact information for care management team; Discussed complications of poorly controlled blood pressure such as heart disease, stroke, circulatory complications, vision complications, kidney impairment, sexual dysfunction;  Education sent via mail- low sodium diet  COPD Interventions:   Provided patient with basic written and verbal COPD education on self care/management/and exacerbation prevention; Advised patient to self assesses COPD action plan zone and make appointment with provider if in the yellow zone for 48 hours without improvement; Advised patient to engage in light exercise as tolerated 3-5 days a week to aid in the the management of COPD; Provided education about and advised patient to utilize infection prevention strategies to reduce risk of respiratory infection; Screening for signs and symptoms of depression related to chronic disease state;  Assessed social determinant of health barriers;   Education sent via mail- COPD action plan  Patient Goals/Self-Care Activities: Take medications as prescribed   Attend all scheduled provider appointments Perform all self care activities independently  Perform IADL's (shopping, preparing meals, housekeeping, managing finances) independently Call provider office for new concerns or questions  - limit outdoor activity during cold weather - do breathing exercises every day - develop a rescue  plan - follow rescue plan if symptoms flare-up - keep follow-up appointments: 04/29/21 primary care provider,  05/19/21 Reclast IV med - get at least 7 to 8 hours of sleep at night check blood pressure 3 times per week choose a place to take my blood pressure (home, clinic or office, retail store) write blood pressure results in a log or diary take blood pressure log to all doctor appointments keep all doctor appointments take medications for blood pressure exactly as prescribed eat more whole grains, fruits and vegetables, lean meats and healthy fats Look over education sent via mail- COPD action plan and low sodium diet  Follow Up Plan:  Telephone follow up appointment with care management team member scheduled for:  06/29/2021      Plan:Telephone follow up appointment with care management team member scheduled for:  06/29/2021  Jacqlyn Larsen [facility], BSN RN Case Manager [place] [phone]

## 2021-04-27 ENCOUNTER — Encounter (HOSPITAL_COMMUNITY): Payer: Self-pay | Admitting: Emergency Medicine

## 2021-04-27 ENCOUNTER — Inpatient Hospital Stay (HOSPITAL_COMMUNITY)
Admission: EM | Admit: 2021-04-27 | Discharge: 2021-04-30 | DRG: 390 | Disposition: A | Discharge status: Home / Self Care | Payer: PPO | Attending: Internal Medicine | Admitting: Internal Medicine

## 2021-04-27 ENCOUNTER — Emergency Department (HOSPITAL_COMMUNITY): Payer: PPO

## 2021-04-27 DIAGNOSIS — R109 Unspecified abdominal pain: Secondary | ICD-10-CM | POA: Diagnosis not present

## 2021-04-27 DIAGNOSIS — K56609 Unspecified intestinal obstruction, unspecified as to partial versus complete obstruction: Secondary | ICD-10-CM | POA: Diagnosis present

## 2021-04-27 DIAGNOSIS — J449 Chronic obstructive pulmonary disease, unspecified: Secondary | ICD-10-CM | POA: Diagnosis not present

## 2021-04-27 DIAGNOSIS — I10 Essential (primary) hypertension: Secondary | ICD-10-CM | POA: Diagnosis present

## 2021-04-27 DIAGNOSIS — Z833 Family history of diabetes mellitus: Secondary | ICD-10-CM

## 2021-04-27 DIAGNOSIS — Z79899 Other long term (current) drug therapy: Secondary | ICD-10-CM

## 2021-04-27 DIAGNOSIS — E7800 Pure hypercholesterolemia, unspecified: Secondary | ICD-10-CM | POA: Diagnosis not present

## 2021-04-27 DIAGNOSIS — Z885 Allergy status to narcotic agent status: Secondary | ICD-10-CM

## 2021-04-27 DIAGNOSIS — Z20822 Contact with and (suspected) exposure to covid-19: Secondary | ICD-10-CM | POA: Diagnosis present

## 2021-04-27 DIAGNOSIS — M810 Age-related osteoporosis without current pathological fracture: Secondary | ICD-10-CM | POA: Diagnosis not present

## 2021-04-27 DIAGNOSIS — K219 Gastro-esophageal reflux disease without esophagitis: Secondary | ICD-10-CM | POA: Diagnosis not present

## 2021-04-27 DIAGNOSIS — K6389 Other specified diseases of intestine: Secondary | ICD-10-CM | POA: Diagnosis not present

## 2021-04-27 DIAGNOSIS — J439 Emphysema, unspecified: Secondary | ICD-10-CM | POA: Diagnosis not present

## 2021-04-27 DIAGNOSIS — K56600 Partial intestinal obstruction, unspecified as to cause: Secondary | ICD-10-CM | POA: Diagnosis not present

## 2021-04-27 DIAGNOSIS — Z8616 Personal history of COVID-19: Secondary | ICD-10-CM | POA: Diagnosis not present

## 2021-04-27 DIAGNOSIS — Z8249 Family history of ischemic heart disease and other diseases of the circulatory system: Secondary | ICD-10-CM

## 2021-04-27 DIAGNOSIS — Z981 Arthrodesis status: Secondary | ICD-10-CM | POA: Diagnosis not present

## 2021-04-27 DIAGNOSIS — K5939 Other megacolon: Secondary | ICD-10-CM | POA: Diagnosis not present

## 2021-04-27 DIAGNOSIS — Z87891 Personal history of nicotine dependence: Secondary | ICD-10-CM

## 2021-04-27 MED ORDER — SODIUM CHLORIDE 0.9 % IV BOLUS
1000.0000 mL | Freq: Once | INTRAVENOUS | Status: AC
  Administered 2021-04-27: 1000 mL via INTRAVENOUS

## 2021-04-27 MED ORDER — FENTANYL CITRATE PF 50 MCG/ML IJ SOSY
50.0000 ug | PREFILLED_SYRINGE | Freq: Once | INTRAMUSCULAR | Status: AC
  Administered 2021-04-27: 50 ug via INTRAVENOUS
  Filled 2021-04-27: qty 1

## 2021-04-27 MED ORDER — ONDANSETRON HCL 4 MG/2ML IJ SOLN
4.0000 mg | Freq: Once | INTRAMUSCULAR | Status: AC
  Administered 2021-04-27: 4 mg via INTRAVENOUS
  Filled 2021-04-27: qty 2

## 2021-04-27 NOTE — ED Provider Notes (Signed)
[facility] Provider Note   CSN: [id number] Arrival date & time: 04/27/21  2249     History Chief Complaint  Patient presents with   Abdominal Pain    Terri Harris is a 80 y.o. female.  patient here with diffuse abdominal pain, nausea and vomiting since approximately noon time.  She is concerned she has another bowel obstruction.  She was admitted about a month ago with a bowel obstruction.  She did not have surgery then.  Reports she has vomited 3-4 times a day.  Last bowel movement was today and loose.  Her abdomen pain is diffuse and she is concerned there is another obstruction.  No fever.  No chest pain or shortness of breath.  No blood thinner use.  No pain with urination or blood in the urine.  Reports having a hysterectomy and hernia repair in the past.  Has had multiple bowel obstructions previously  The history is provided by the patient and the spouse.  Abdominal Pain Associated symptoms: diarrhea, nausea and vomiting   Associated symptoms: no chest pain, no constipation, no cough, no dysuria, no fever, no hematuria and no shortness of breath       Past Medical History:  Diagnosis Date   ANA positive 01/2001   Arthritis    Arthritis 07/02/2013   ANA positive   Cancer ([place])    partial hysterectomy age 24-uterine cancer   GERD (gastroesophageal reflux disease)    Hx of tobacco use, presenting hazards to health 07/02/2013   Hypercholesteremia    Hypertension    Ileus, postoperative ([place]) 07/02/2013   Osteoporosis    Pneumonia    hx of   SBO (small bowel obstruction) ([place]) 06/20/2103    Patient Active Problem List   Diagnosis Date Noted   COVID-19 virus infection 03/28/2021   Vitamin B12 deficiency 12/09/2019   Lumbar stenosis 01/07/2019   Hx of colonic polyps 12/22/2016   Nocturnal headaches 01/26/2016   Dysphagia 05/06/2015   Hyperlipidemia 04/19/2015   Chronic lumbar pain 04/19/2015   Lumbar stenosis with neurogenic claudication  06/08/2014   HTN (hypertension) 03/27/2014   Incarcerated femoral hernia 09/11/2013   Ileus, postoperative ([place]) 07/02/2013   GERD (gastroesophageal reflux disease) 07/02/2013   Hx of tobacco use, presenting hazards to health 07/02/2013   Arthritis 07/02/2013   SBO (small bowel obstruction) ([place]) 06/01/2013   Dehydration 06/01/2013   Gastritis 04/13/2013   Abdominal pain, unspecified site 04/13/2013   Osteoporosis 04/13/2013   Personal history of colonic polyps 04/13/2013   COPD (chronic obstructive pulmonary disease) ([place]) 04/13/2013    Past Surgical History:  Procedure Laterality Date   ABDOMINAL HYSTERECTOMY     ANTERIOR LAT LUMBAR FUSION N/A 06/08/2014   Procedure: LUMBAR 2-3 LUMBAR 3-4 LUMBAR 4-5 ANTEROLATERAL DECOMPRESSION/FUSION WITH PERCUTANEOUS PEDICLE SCREWS;  Surgeon: Kristeen Miss, MD;  [facility];  Service: Neurosurgery;  Laterality: N/A;  L2-3 L3-4 L4-5 ANTEROLATERAL DECOMPRESSION/FUSION WITH PERCUTANEOUS PEDICLE SCREWS   ANTERIOR LAT LUMBAR FUSION Left 01/07/2019   Procedure: Lumbar one-two Anterolateral lumbar interbody fusion with lateral plate fixation;  Surgeon: Kristeen Miss, MD;  Location: [facility];  Service: Neurosurgery;  Laterality: Left;   BACK SURGERY     BOWEL RESECTION N/A 06/20/2013   Procedure: SMALL BOWEL RESECTION;  Surgeon: Harl Bowie, MD;  Location: [facility];  Service: General;  Laterality: N/A;   COLON SURGERY     COLONOSCOPY  10/25/2011   Procedure: COLONOSCOPY;  Surgeon: Rogene Houston, MD;  [facility];  Service: Endoscopy;  Laterality: N/A;  930   COLONOSCOPY N/A 05/09/2017   Procedure: COLONOSCOPY;  Surgeon: Rogene Houston, MD;  [facility];  Service: Endoscopy;  Laterality: N/A;  1030   ESOPHAGEAL DILATION N/A 05/25/2015   Procedure: ESOPHAGEAL DILATION;  Surgeon: Rogene Houston, MD;  [facility];  Service: Endoscopy;  Laterality: N/A;   ESOPHAGOGASTRODUODENOSCOPY  02/2009    ESOPHAGOGASTRODUODENOSCOPY N/A 05/25/2015   Procedure: ESOPHAGOGASTRODUODENOSCOPY (EGD);  Surgeon: Rogene Houston, MD;  [facility];  Service: Endoscopy;  Laterality: N/A;   HERNIA REPAIR Left    groin   INGUINAL HERNIA REPAIR Left 09/11/2013   Procedure: HERNIA REPAIR INGUINAL INCARCERATED;  Surgeon: Harl Bowie, MD;  Location: [facility];  Service: General;  Laterality: Left;   LAPAROTOMY N/A 06/20/2013   Procedure: EXPLORATORY LAPAROTOMY;  Surgeon: Harl Bowie, MD;  Location: [facility];  Service: General;  Laterality: N/A;   LUMBAR PERCUTANEOUS PEDICLE SCREW 3 LEVEL N/A 06/08/2014   Procedure: LUMBAR PERCUTANEOUS PEDICLE SCREW 3 LEVEL;  Surgeon: Kristeen Miss, MD;  [facility];  Service: Neurosurgery;  Laterality: N/A;   POLYPECTOMY  05/09/2017   Procedure: POLYPECTOMY;  Surgeon: Rogene Houston, MD;  [facility];  Service: Endoscopy;;  sigmoid colon   TONSILLECTOMY       OB History   No obstetric history on file.     Family History  Problem Relation Age of Onset   Hypertension Mother    Heart disease Father    Diabetes Sister    Heart disease Sister    Diabetes Brother    Heart disease Brother    Anesthesia problems Neg Hx    Hypotension Neg Hx    Malignant hyperthermia Neg Hx    Pseudochol deficiency Neg Hx     Social History   Tobacco Use   Smoking status: Former    Packs/day: 0.25    Years: 0.00    Pack years: 0.00    Types: Cigarettes    Quit date: 10/18/2011    Years since quitting: 9.5   Smokeless tobacco: Never  Vaping Use   Vaping Use: Never used  Substance Use Topics   Alcohol use: No   Drug use: No    Home Medications Prior to Admission medications   Medication Sig Start Date End Date Taking? Authorizing Provider  acetaminophen (TYLENOL) 325 MG tablet Take 2 tablets (650 mg total) by mouth every 6 (six) hours as needed for mild pain (or Fever >/= 101). 04/03/21   Roxan Hockey, MD  alum & mag hydroxide-simeth  (MAALOX/MYLANTA) 200-200-20 MG/5ML suspension Take 15 mLs by mouth every 6 (six) hours as needed for indigestion or heartburn.    [provider]  amLODipine (NORVASC) 5 MG tablet Take 1 tablet (5 mg total) by mouth daily. 12/06/20   Kathyrn Drown, MD  cyanocobalamin 1000 MCG tablet Take 1,000 mcg by mouth daily.    [provider]  diphenhydrAMINE (BENADRYL) 25 MG tablet Take 25 mg by mouth every 6 (six) hours as needed.    [provider]  oxyCODONE-acetaminophen (PERCOCET/ROXICET) 5-325 MG tablet 1 qid prn 12/06/20   Kathyrn Drown, MD  pantoprazole (PROTONIX) 40 MG tablet Take 1 tablet (40 mg total) by mouth 2 (two) times daily before a meal. 04/03/21   Emokpae, Courage, MD  polyethylene glycol (MIRALAX / GLYCOLAX) 17 g packet Take 17 g by mouth daily. 04/03/21   Emokpae,  Courage, MD  zoledronic acid (RECLAST) 5 MG/100ML SOLN injection Inject 5 mg into the vein once. 05/15/18   [provider]    Allergies    Codeine and Fosamax [alendronate sodium]  Review of Systems   Review of Systems  Constitutional:  Positive for activity change and appetite change. Negative for fever.  HENT:  Negative for congestion and rhinorrhea.   Respiratory:  Negative for cough, chest tightness and shortness of breath.   Cardiovascular:  Negative for chest pain.  Gastrointestinal:  Positive for abdominal pain, diarrhea, nausea and vomiting. Negative for constipation.  Genitourinary:  Negative for dysuria and hematuria.  Musculoskeletal:  Negative for arthralgias and myalgias.  Skin:  Negative for rash.  Neurological:  Negative for dizziness, weakness and headaches.   all other systems are negative except as noted in the HPI and PMH.   Physical Exam Updated Vital Signs BP (!) 160/91 (BP Location: Right Arm)   Pulse 88   Temp 97.7 F (36.5 C) (Oral)   Resp 20   Ht 5\' 5"  (1.651 m)   Wt 52.1 kg   LMP  (LMP Unknown)   SpO2 98%   BMI 19.11 kg/m   Physical  Exam Vitals and nursing note reviewed.  Constitutional:      General: She is not in acute distress.    Appearance: She is well-developed.  HENT:     Head: Normocephalic and atraumatic.     Mouth/Throat:     Pharynx: No oropharyngeal exudate.  Eyes:     Conjunctiva/sclera: Conjunctivae normal.     Pupils: Pupils are equal, round, and reactive to light.  Neck:     Comments: No meningismus. Cardiovascular:     Rate and Rhythm: Normal rate and regular rhythm.     Heart sounds: Normal heart sounds. No murmur heard. Pulmonary:     Effort: Pulmonary effort is normal. No respiratory distress.     Breath sounds: Normal breath sounds.  Abdominal:     General: There is distension.     Palpations: Abdomen is soft.     Tenderness: There is abdominal tenderness. There is guarding. There is no rebound.     Comments: Diffuse abdominal tenderness.  Voluntary guarding throughout.  No rebound  Musculoskeletal:        General: No tenderness. Normal range of motion.     Cervical back: Normal range of motion and neck supple.  Skin:    General: Skin is warm.  Neurological:     Mental Status: She is alert and oriented to person, place, and time.     Cranial Nerves: No cranial nerve deficit.     Motor: No abnormal muscle tone.     Coordination: Coordination normal.     Comments:  5/5 strength throughout. CN 2-12 intact.Equal grip strength.   Psychiatric:        Behavior: Behavior normal.    ED Results / Procedures / Treatments   Labs (all labs ordered are listed, but only abnormal results are displayed) Labs Reviewed  COMPREHENSIVE METABOLIC PANEL - Abnormal; Notable for the following components:      Result Value   Chloride 95 (*)    Glucose, Bld 123 (*)    Creatinine, Ser 1.01 (*)    GFR, Estimated 57 (*)    All other components within normal limits  CBC - Abnormal; Notable for the following components:   WBC 15.0 (*)    Hemoglobin 15.9 (*)    HCT 48.4 (*)  All other components  within normal limits  LACTIC ACID, PLASMA - Abnormal; Notable for the following components:   Lactic Acid, Venous 2.2 (*)    All other components within normal limits  RESP PANEL BY RT-PCR (FLU A&B, COVID) ARPGX2  LIPASE, BLOOD  LACTIC ACID, PLASMA  URINALYSIS, ROUTINE W REFLEX MICROSCOPIC  TROPONIN I (HIGH SENSITIVITY)  TROPONIN I (HIGH SENSITIVITY)    EKG EKG Interpretation  Date/Time:  Wednesday April 27 2021 23:17:45 EST Ventricular Rate:  84 PR Interval:  152 QRS Duration: 85 QT Interval:  368 QTC Calculation: 430 R Axis:   27 Text Interpretation: Sinus rhythm Atrial premature complex Consider right atrial enlargement Anteroseptal infarct, old No significant change was found Confirmed by Ezequiel Essex [phone]) on 04/27/2021 11:47:51 PM  Radiology DG Abdomen Acute W/Chest  Result Date: 04/28/2021 CLINICAL DATA:  Abdominal pain. EXAM: DG ABDOMEN ACUTE WITH 1 VIEW CHEST COMPARISON:  Radiograph dated 04/02/2021. FINDINGS: Background of emphysema. No focal consolidation, pleural effusion, pneumothorax. The cardiac silhouette is within normal limits. Diffusely dilated air-filled loops of small bowel measure up to 4.2 cm. No air noted within the colon. No free air. Degenerative changes of the spine and multilevel fusion hardware. No acute osseous pathology. IMPRESSION: Findings consistent with small bowel obstruction. Electronically Signed   By: Anner Crete M.D.   On: 04/28/2021 00:07    Procedures Procedures   Medications Ordered in ED Medications  sodium chloride 0.9 % bolus 1,000 mL (has no administration in time range)  ondansetron (ZOFRAN) injection 4 mg (has no administration in time range)  fentaNYL (SUBLIMAZE) injection 50 mcg (has no administration in time range)    ED Course  I have reviewed the triage vital signs and the nursing notes.  Pertinent labs & imaging results that were available during my care of the patient were reviewed by me and considered in  my medical decision making (see chart for details).    MDM Rules/Calculators/A&P                           Diffuse abdominal pain with vomiting and concern for recurrent bowel obstruction.  Vital stable, no distress.  Will obtain labs, IVF, symptom control.  CT not available currently.  We will start with x-ray.  Lactate 2.2.  Leukocytosis of 15.  Electrolytes otherwise normal.  X-ray is concerning for bowel obstruction.  No free air.  CT scan not available currently.  Will place NG tube, continue hydration and plan admission.  NG tube placed.  Continue IV hydration, pain and nausea control.  Admission discussed with Dr. Josephine Cables.  He agrees CT scan unlikely to change management tonight.  Will hopefully be available in the morning.  Will need surgery consult in the morning. Final Clinical Impression(s) / ED Diagnoses Final diagnoses:  Small bowel obstruction [facility])    Rx / DC Orders ED Discharge Orders     None        Adline Kirshenbaum, Annie Main, MD 04/28/21 [phone]

## 2021-04-27 NOTE — ED Triage Notes (Signed)
Pt c/o abd pain and vomiting that started this afternoon. Pt concerned for another bowel blockage.

## 2021-04-28 ENCOUNTER — Other Ambulatory Visit: Payer: Self-pay

## 2021-04-28 DIAGNOSIS — K219 Gastro-esophageal reflux disease without esophagitis: Secondary | ICD-10-CM | POA: Diagnosis present

## 2021-04-28 DIAGNOSIS — Z79899 Other long term (current) drug therapy: Secondary | ICD-10-CM | POA: Diagnosis not present

## 2021-04-28 DIAGNOSIS — I10 Essential (primary) hypertension: Secondary | ICD-10-CM | POA: Diagnosis present

## 2021-04-28 DIAGNOSIS — J449 Chronic obstructive pulmonary disease, unspecified: Secondary | ICD-10-CM | POA: Diagnosis present

## 2021-04-28 DIAGNOSIS — Z20822 Contact with and (suspected) exposure to covid-19: Secondary | ICD-10-CM | POA: Diagnosis present

## 2021-04-28 DIAGNOSIS — M810 Age-related osteoporosis without current pathological fracture: Secondary | ICD-10-CM | POA: Diagnosis present

## 2021-04-28 DIAGNOSIS — Z833 Family history of diabetes mellitus: Secondary | ICD-10-CM | POA: Diagnosis not present

## 2021-04-28 DIAGNOSIS — K56609 Unspecified intestinal obstruction, unspecified as to partial versus complete obstruction: Secondary | ICD-10-CM | POA: Diagnosis not present

## 2021-04-28 DIAGNOSIS — Z8249 Family history of ischemic heart disease and other diseases of the circulatory system: Secondary | ICD-10-CM | POA: Diagnosis not present

## 2021-04-28 DIAGNOSIS — Z981 Arthrodesis status: Secondary | ICD-10-CM | POA: Diagnosis not present

## 2021-04-28 DIAGNOSIS — K56600 Partial intestinal obstruction, unspecified as to cause: Secondary | ICD-10-CM | POA: Diagnosis present

## 2021-04-28 DIAGNOSIS — Z87891 Personal history of nicotine dependence: Secondary | ICD-10-CM | POA: Diagnosis not present

## 2021-04-28 DIAGNOSIS — E7800 Pure hypercholesterolemia, unspecified: Secondary | ICD-10-CM | POA: Diagnosis present

## 2021-04-28 DIAGNOSIS — Z885 Allergy status to narcotic agent status: Secondary | ICD-10-CM | POA: Diagnosis not present

## 2021-04-28 DIAGNOSIS — Z8616 Personal history of COVID-19: Secondary | ICD-10-CM | POA: Diagnosis not present

## 2021-04-28 DIAGNOSIS — K6389 Other specified diseases of intestine: Secondary | ICD-10-CM | POA: Diagnosis not present

## 2021-04-28 LAB — CBC
HCT: 45.9 % (ref 36.0–46.0)
HCT: 48.4 % — ABNORMAL HIGH (ref 36.0–46.0)
Hemoglobin: 14.9 g/dL (ref 12.0–15.0)
Hemoglobin: 15.9 g/dL — ABNORMAL HIGH (ref 12.0–15.0)
MCH: 32.6 pg (ref 26.0–34.0)
MCH: 32.9 pg (ref 26.0–34.0)
MCHC: 32.5 g/dL (ref 30.0–36.0)
MCHC: 32.9 g/dL (ref 30.0–36.0)
MCV: 101.3 fL — ABNORMAL HIGH (ref 80.0–100.0)
MCV: 99.2 fL (ref 80.0–100.0)
Platelets: 256 10*3/uL (ref 150–400)
Platelets: 282 10*3/uL (ref 150–400)
RBC: 4.53 MIL/uL (ref 3.87–5.11)
RBC: 4.88 MIL/uL (ref 3.87–5.11)
RDW: 12.9 % (ref 11.5–15.5)
RDW: 13.2 % (ref 11.5–15.5)
WBC: 12.5 10*3/uL — ABNORMAL HIGH (ref 4.0–10.5)
WBC: 15 10*3/uL — ABNORMAL HIGH (ref 4.0–10.5)
nRBC: 0 % (ref 0.0–0.2)
nRBC: 0 % (ref 0.0–0.2)

## 2021-04-28 LAB — CREATININE, SERUM
Creatinine, Ser: 0.97 mg/dL (ref 0.44–1.00)
GFR, Estimated: 59 mL/min — ABNORMAL LOW (ref >60)

## 2021-04-28 LAB — RESP PANEL BY RT-PCR (FLU A&B, COVID) ARPGX2
Influenza A by PCR: NEGATIVE
Influenza B by PCR: NEGATIVE
SARS Coronavirus 2 by RT PCR: NEGATIVE

## 2021-04-28 LAB — COMPREHENSIVE METABOLIC PANEL
ALT: 14 U/L (ref 0–44)
AST: 24 U/L (ref 15–41)
Albumin: 4.4 g/dL (ref 3.5–5.0)
Alkaline Phosphatase: 81 U/L (ref 38–126)
Anion gap: 12 (ref 5–15)
BUN: 20 mg/dL (ref 8–23)
CO2: 29 mmol/L (ref 22–32)
Calcium: 9.8 mg/dL (ref 8.9–10.3)
Chloride: 95 mmol/L — ABNORMAL LOW (ref 98–111)
Creatinine, Ser: 1.01 mg/dL — ABNORMAL HIGH (ref 0.44–1.00)
GFR, Estimated: 57 mL/min — ABNORMAL LOW (ref >60)
Glucose, Bld: 123 mg/dL — ABNORMAL HIGH (ref 70–99)
Potassium: 3.7 mmol/L (ref 3.5–5.1)
Sodium: 136 mmol/L (ref 135–145)
Total Bilirubin: 1.1 mg/dL (ref 0.3–1.2)
Total Protein: 7.9 g/dL (ref 6.5–8.1)

## 2021-04-28 LAB — TROPONIN I (HIGH SENSITIVITY)
Troponin I (High Sensitivity): 6 ng/L (ref <18)
Troponin I (High Sensitivity): 6 ng/L (ref <18)

## 2021-04-28 LAB — LACTIC ACID, PLASMA
Lactic Acid, Venous: 1.2 mmol/L (ref 0.5–1.9)
Lactic Acid, Venous: 2.2 mmol/L (ref 0.5–1.9)

## 2021-04-28 LAB — MAGNESIUM: Magnesium: 2.6 mg/dL — ABNORMAL HIGH (ref 1.7–2.4)

## 2021-04-28 LAB — LIPASE, BLOOD: Lipase: 28 U/L (ref 11–51)

## 2021-04-28 LAB — PHOSPHORUS: Phosphorus: 4.2 mg/dL (ref 2.5–4.6)

## 2021-04-28 MED ORDER — FENTANYL CITRATE PF 50 MCG/ML IJ SOSY
50.0000 ug | PREFILLED_SYRINGE | INTRAMUSCULAR | Status: DC | PRN
  Administered 2021-04-28 – 2021-04-30 (×14): 50 ug via INTRAVENOUS
  Filled 2021-04-28 (×15): qty 1

## 2021-04-28 MED ORDER — FENTANYL CITRATE PF 50 MCG/ML IJ SOSY
25.0000 ug | PREFILLED_SYRINGE | INTRAMUSCULAR | Status: DC | PRN
  Administered 2021-04-28 (×2): 25 ug via INTRAVENOUS
  Filled 2021-04-28: qty 1

## 2021-04-28 MED ORDER — KCL IN DEXTROSE-NACL 40-5-0.45 MEQ/L-%-% IV SOLN: INTRAVENOUS | Status: DC

## 2021-04-28 MED ORDER — ACETAMINOPHEN 325 MG PO TABS: 650.0000 mg | ORAL_TABLET | Freq: Four times a day (QID) | ORAL | Status: DC | PRN

## 2021-04-28 MED ORDER — LIDOCAINE HCL URETHRAL/MUCOSAL 2 % EX GEL
1.0000 "application " | Freq: Once | CUTANEOUS | Status: AC
  Administered 2021-04-28: 1 via TOPICAL
  Filled 2021-04-28: qty 10

## 2021-04-28 MED ORDER — LABETALOL HCL 5 MG/ML IV SOLN: 10.0000 mg | Freq: Three times a day (TID) | INTRAVENOUS | Status: DC | PRN

## 2021-04-28 MED ORDER — ENOXAPARIN SODIUM 30 MG/0.3ML IJ SOSY
30.0000 mg | PREFILLED_SYRINGE | INTRAMUSCULAR | Status: DC
  Administered 2021-04-28: 30 mg via SUBCUTANEOUS
  Filled 2021-04-28 (×2): qty 0.3

## 2021-04-28 MED ORDER — ONDANSETRON HCL 4 MG PO TABS: 4.0000 mg | ORAL_TABLET | Freq: Four times a day (QID) | ORAL | Status: DC | PRN

## 2021-04-28 MED ORDER — PANTOPRAZOLE SODIUM 40 MG IV SOLR
40.0000 mg | Freq: Every day | INTRAVENOUS | Status: DC
  Administered 2021-04-28 – 2021-04-30 (×3): 40 mg via INTRAVENOUS
  Filled 2021-04-28 (×3): qty 40

## 2021-04-28 MED ORDER — ACETAMINOPHEN 650 MG RE SUPP
650.0000 mg | Freq: Four times a day (QID) | RECTAL | Status: DC | PRN
  Administered 2021-04-28: 650 mg via RECTAL
  Filled 2021-04-28: qty 1

## 2021-04-28 MED ORDER — ONDANSETRON HCL 4 MG/2ML IJ SOLN: 4.0000 mg | Freq: Four times a day (QID) | INTRAMUSCULAR | Status: DC | PRN

## 2021-04-28 NOTE — ED Notes (Signed)
Patient noted to be satting 86-88% on RA while sleeping. Patient placed on oxygen via [place] @ 1LPM. SPO2 increased to 95%.

## 2021-04-28 NOTE — Progress Notes (Signed)
Bladder scan patient revealed 480cc . In and out cath x 1 , this Probation officer and Beth, Therapist, sports ., 200cc of amber urine returned to catheter bag

## 2021-04-28 NOTE — H&P (Signed)
History and Physical    SHERRYANN FRESE KZS:[id number] DOB: 26-Aug-1940 DOA: 04/27/2021  PCP: Kathyrn Drown, MD   Patient coming from: home  I have personally briefly reviewed patient's old medical records in [place]  Chief Complaint: Abdominal pain and lack of BM  HPI: Terri Harris is a 80 y.o. female with medical history significant of COPD, prior history of small bowel obstruction (with history of hysterectomy and hernia repair in the past), hypertension, gastroesophageal reflux disease, hyperlipidemia, osteoporosis and arthritis; who presented to the hospital secondary to abdominal pain, bloating sensation, nausea, and decreased inability to move bowels.  Symptom has been present for the last 2 days prior to admission.  On the day that she presented to the hospital expressed having a small liquid stool.  Patient denies shortness of breath, chest pain, focal weakness, dysuria, hematuria, sick contacts or any other complaints.  Patient is vaccinated and boosted against COVID.  PCR negative in the ED.  Of note, patient with hospitalization on 03/28/21--04/03/21 due to SBO that did not require surgical repair.  ED Course: Patient expressed feeling nauseous, with abdominal pain and abdominal x-ray demonstrating SBO.  NG tube was placed with partial relief.  Analgesics and antiemetics were provided.  TRH was contacted to place in the hospital for further evaluation and management.  Review of Systems: As per HPI otherwise all other systems reviewed and are negative.  Past Medical History:  Diagnosis Date   ANA positive 01/2001   Arthritis    Arthritis 07/02/2013   ANA positive   Cancer (HCC)    partial hysterectomy age 27-uterine cancer   GERD (gastroesophageal reflux disease)    Hx of tobacco use, presenting hazards to health 07/02/2013   Hypercholesteremia    Hypertension    Ileus, postoperative ([place]) 07/02/2013   Osteoporosis    Pneumonia    hx of   SBO (small bowel  obstruction) ([place]) 06/20/2103    Past Surgical History:  Procedure Laterality Date   ABDOMINAL HYSTERECTOMY     ANTERIOR LAT LUMBAR FUSION N/A 06/08/2014   Procedure: LUMBAR 2-3 LUMBAR 3-4 LUMBAR 4-5 ANTEROLATERAL DECOMPRESSION/FUSION WITH PERCUTANEOUS PEDICLE SCREWS;  Surgeon: Kristeen Miss, MD;  Location: [facility];  Service: Neurosurgery;  Laterality: N/A;  L2-3 L3-4 L4-5 ANTEROLATERAL DECOMPRESSION/FUSION WITH PERCUTANEOUS PEDICLE SCREWS   ANTERIOR LAT LUMBAR FUSION Left 01/07/2019   Procedure: Lumbar one-two Anterolateral lumbar interbody fusion with lateral plate fixation;  Surgeon: Kristeen Miss, MD;  Location: [place];  Service: Neurosurgery;  Laterality: Left;   BACK SURGERY     BOWEL RESECTION N/A 06/20/2013   Procedure: SMALL BOWEL RESECTION;  Surgeon: Harl Bowie, MD;  Location: [place];  Service: General;  Laterality: N/A;   COLON SURGERY     COLONOSCOPY  10/25/2011   Procedure: COLONOSCOPY;  Surgeon: Rogene Houston, MD;  [facility];  Service: Endoscopy;  Laterality: N/A;  930   COLONOSCOPY N/A 05/09/2017   Procedure: COLONOSCOPY;  Surgeon: Rogene Houston, MD;  [facility];  Service: Endoscopy;  Laterality: N/A;  1030   ESOPHAGEAL DILATION N/A 05/25/2015   Procedure: ESOPHAGEAL DILATION;  Surgeon: Rogene Houston, MD;  [facility];  Service: Endoscopy;  Laterality: N/A;   ESOPHAGOGASTRODUODENOSCOPY  02/2009   ESOPHAGOGASTRODUODENOSCOPY N/A 05/25/2015   Procedure: ESOPHAGOGASTRODUODENOSCOPY (EGD);  Surgeon: Rogene Houston, MD;  [facility];  Service: Endoscopy;  Laterality: N/A;   HERNIA REPAIR Left    groin  INGUINAL HERNIA REPAIR Left 09/11/2013   Procedure: HERNIA REPAIR INGUINAL INCARCERATED;  Surgeon: Harl Bowie, MD;  Location: [place];  Service: General;  Laterality: Left;   LAPAROTOMY N/A 06/20/2013   Procedure: EXPLORATORY LAPAROTOMY;  Surgeon: Harl Bowie, MD;  Location: [place];  Service: General;   Laterality: N/A;   LUMBAR PERCUTANEOUS PEDICLE SCREW 3 LEVEL N/A 06/08/2014   Procedure: LUMBAR PERCUTANEOUS PEDICLE SCREW 3 LEVEL;  Surgeon: Kristeen Miss, MD;  [facility];  Service: Neurosurgery;  Laterality: N/A;   POLYPECTOMY  05/09/2017   Procedure: POLYPECTOMY;  Surgeon: Rogene Houston, MD;  [facility];  Service: Endoscopy;;  sigmoid colon   TONSILLECTOMY      Social History  reports that she quit smoking about 9 years ago. Her smoking use included cigarettes. She smoked an average of 0.25 packs per day. She has never used smokeless tobacco. She reports that she does not drink alcohol and does not use drugs.  Allergies  Allergen Reactions   Codeine Nausea And Vomiting   Fosamax [Alendronate Sodium] Other (See Comments)    gastritis    Family History  Problem Relation Age of Onset   Hypertension Mother    Heart disease Father    Diabetes Sister    Heart disease Sister    Diabetes Brother    Heart disease Brother    Anesthesia problems Neg Hx    Hypotension Neg Hx    Malignant hyperthermia Neg Hx    Pseudochol deficiency Neg Hx     Prior to Admission medications   Medication Sig Start Date End Date Taking? Authorizing Provider  acetaminophen (TYLENOL) 325 MG tablet Take 2 tablets (650 mg total) by mouth every 6 (six) hours as needed for mild pain (or Fever >/= 101). 04/03/21  Yes Roxan Hockey, MD  alum & mag hydroxide-simeth (MAALOX/MYLANTA) 200-200-20 MG/5ML suspension Take 15 mLs by mouth every 6 (six) hours as needed for indigestion or heartburn.   Yes [provider]  amLODipine (NORVASC) 5 MG tablet Take 1 tablet (5 mg total) by mouth daily. 12/06/20  Yes Kathyrn Drown, MD  cyanocobalamin 1000 MCG tablet Take 1,000 mcg by mouth daily.   Yes [provider]  diphenhydrAMINE (BENADRYL) 25 MG tablet Take 25 mg by mouth every 6 (six) hours as needed.   Yes [provider]  oxyCODONE-acetaminophen (PERCOCET/ROXICET)  5-325 MG tablet 1 qid prn 12/06/20  Yes Luking, Elayne Snare, MD  pantoprazole (PROTONIX) 40 MG tablet Take 1 tablet (40 mg total) by mouth 2 (two) times daily before a meal. 04/03/21  Yes Emokpae, Courage, MD  polyethylene glycol (MIRALAX / GLYCOLAX) 17 g packet Take 17 g by mouth daily. 04/03/21  Yes Emokpae, Courage, MD  zoledronic acid (RECLAST) 5 MG/100ML SOLN injection Inject 5 mg into the vein once. Patient not taking: Reported on 04/28/2021 05/15/18   [provider]    Physical Exam: Vitals:   04/28/21 0630 04/28/21 0730 04/28/21 0808 04/28/21 0809  BP: 140/79 (!) 129/57  (!) 143/70  Pulse: 84 70  75  Resp: (!) 21 20  20   Temp:  98 F (36.7 C) 98.1 F (36.7 C)   TempSrc:   Oral   SpO2: 96% 95%  100%  Weight:      Height:        Constitutional: Mild distress secondary to abdominal pain; no fever, no chest pain, no shortness of breath.  Expressed improvement in her prior to admission nausea symptoms.  Vitals:   04/28/21 0630 04/28/21 0730 04/28/21 0808 04/28/21 0809  BP: 140/79 (!) 129/57  (!) 143/70  Pulse: 84 70  75  Resp: (!) 21 20  20   Temp:  98 F (36.7 C) 98.1 F (36.7 C)   TempSrc:   Oral   SpO2: 96% 95%  100%  Weight:      Height:       Eyes: PERRL, lids and conjunctivae normal; no icterus, no nystagmus. ENMT: Mucous membranes are moist. Posterior pharynx clear of any exudate or lesions. Neck: normal, supple, no masses, no thyromegaly; no JVD. Respiratory: clear to auscultation bilaterally, no wheezing, no crackles. Normal respiratory effort. No accessory muscle use.  Cardiovascular: Regular rate and rhythm, no murmurs / rubs / gallops. No extremity edema. 2+ pedal pulses. No carotid bruits.  Abdomen: Tender to palpation in her mid abdomen; no guarding.  Decreased to absent bowel sounds. Musculoskeletal: no clubbing / cyanosis. No joint deformity upper and lower extremities. Good ROM, no contractures. Normal muscle tone.  Skin: no petechiae. Neurologic: CN  2-12 grossly intact. Sensation intact, DTR normal. Strength 5/5 in all 4.  Psychiatric: Normal judgment and insight. Alert and oriented x 3. Normal mood.   Labs on Admission: I have personally reviewed following labs and imaging studies  CBC: Recent Labs  Lab 04/27/21 2350  WBC 15.0*  HGB 15.9*  HCT 48.4*  MCV 99.2  PLT 629    Basic Metabolic Panel: Recent Labs  Lab 04/27/21 2350  NA 136  K 3.7  CL 95*  CO2 29  GLUCOSE 123*  BUN 20  CREATININE 1.01*  CALCIUM 9.8    GFR: Estimated Creatinine Clearance: 37.1 mL/min (A) (by C-G formula based on SCr of 1.01 mg/dL (H)).  Liver Function Tests: Recent Labs  Lab 04/27/21 2350  AST 24  ALT 14  ALKPHOS 81  BILITOT 1.1  PROT 7.9  ALBUMIN 4.4    Urine analysis:    Component Value Date/Time   COLORURINE YELLOW 12/28/2014 0333   APPEARANCEUR CLEAR 12/28/2014 0333   LABSPEC 1.007 12/28/2014 0333   PHURINE 7.0 12/28/2014 0333   GLUCOSEU NEGATIVE 12/28/2014 0333   HGBUR NEGATIVE 12/28/2014 0333   BILIRUBINUR NEGATIVE 12/28/2014 0333   KETONESUR NEGATIVE 12/28/2014 0333   PROTEINUR NEGATIVE 12/28/2014 0333   UROBILINOGEN 0.2 12/28/2014 0333   NITRITE NEGATIVE 12/28/2014 0333   LEUKOCYTESUR TRACE (A) 12/28/2014 0333    Radiological Exams on Admission: DG Abdomen Acute W/Chest  Result Date: 04/28/2021 CLINICAL DATA:  Abdominal pain. EXAM: DG ABDOMEN ACUTE WITH 1 VIEW CHEST COMPARISON:  Radiograph dated 04/02/2021. FINDINGS: Background of emphysema. No focal consolidation, pleural effusion, pneumothorax. The cardiac silhouette is within normal limits. Diffusely dilated air-filled loops of small bowel measure up to 4.2 cm. No air noted within the colon. No free air. Degenerative changes of the spine and multilevel fusion hardware. No acute osseous pathology. IMPRESSION: Findings consistent with small bowel obstruction. Electronically Signed   By: Anner Crete M.D.   On: 04/28/2021 00:07    EKG: Independently reviewed.   Sinus rhythm.  No acute ischemic changes.  Assessment/Plan 1-the small bowel obstruction -Patient has presented with abdominal pain, bloating/distention and associated nausea. -Chest x-ray demonstrating SBO -No prior history of abdominal surgery, increasing concerns for scar tissue.. -Conservative management implemented with the use of NG tube, will provide fluid resuscitation and electrolyte repletion -Analgesics and antiemetics. -Patient will benefit of a small bowel follow-through images -General surgery consulted.  2-history of COPD -Appears to  be stable -Currently reports no shortness of breath or wheezing -Continue as needed bronchodilators.  3-hypertension -Holding oral antihypertensive agents due to n.p.o. status -As needed labetalol has been ordered.  4-gastroesophageal reflux disease -Continue PPI (IV currently due to n.p.o. status).  5-history of osteoporosis -Resume zoledronic acid injection as an outpatient.  6-history of hyperlipidemia -Currently following diet control only. -Outpatient follow-up with PCP to determine the need of statins.  DVT prophylaxis: Lovenox Code Status:   Full code Family Communication:  Daughter at bedside. Disposition Plan:   Patient is from:  Home  Anticipated DC to:  Home  Anticipated DC date:  To be determined  Anticipated DC barriers: Resolution of SBO and resumption of bowel function.  Consults called:  General surgery Admission status:  Inpatient, length of stay more than 2 midnights; MedSurg.  Severity of Illness: The appropriate patient status for this patient is INPATIENT. Inpatient status is judged to be reasonable and necessary in order to provide the required intensity of service to ensure the patient's safety. The patient's presenting symptoms, physical exam findings, and initial radiographic and laboratory data in the context of their chronic comorbidities is felt to place them at high risk for further clinical  deterioration. Furthermore, it is not anticipated that the patient will be medically stable for discharge from the hospital within 2 midnights of admission.   * I certify that at the point of admission it is my clinical judgment that the patient will require inpatient hospital care spanning beyond 2 midnights from the point of admission due to high intensity of service, high risk for further deterioration and high frequency of surveillance required.Barton Dubois MD [facility]  How to contact the [facility] Attending or Consulting provider [place] or covering provider during after hours [place], for this patient?   Check the care team in [facility] and look for a) attending/consulting [facility] provider listed and b) the [facility] team listed Log into [email or web address] and use [place]'s universal password to access. If you do not have the password, please contact the hospital operator. Locate the [facility] provider you are looking for under [facility] and page to a number that you can be directly reached. If you still have difficulty reaching the provider, please page the [facility] (Director on Call) for the Hospitalists listed on amion for assistance.  04/28/2021, 8:54 AM

## 2021-04-28 NOTE — Progress Notes (Signed)
Patient voided on BSC .

## 2021-04-28 NOTE — Progress Notes (Signed)
Initial Nutrition Assessment  DOCUMENTATION CODES:      INTERVENTION:  RD will follow patient progression    NUTRITION DIAGNOSIS:   Inadequate oral intake related to acute illness (vomiting prior to admission) as evidenced by NPO status.   GOAL:  Patient will meet greater than or equal to 90% of their needs   MONITOR:  Diet advancement, Labs, Weight trends  REASON FOR ASSESSMENT:   Malnutrition Screening Tool    ASSESSMENT: Patient is a 80 yo female with hx of HTN, SBO, Ileus, GERD, arthritis. Presents with complaint of abdominal pain, vomiting. Hospitalized last month with SBO.   Patient is NPO. NGT (12 Fr left nare- (external length -74 cm). Low intermittent suction- yellow drainage. Patient weight trending down since March from 57-58 kg to current 52.1 kg  (9.6%) x 8 months. Suspect moderate malnutrition related to SBO.   Medications reviewed and include: Protonix.   IV-D5 and 0.45% NS@ 75 ml/hr   Labs: BMP Latest Ref Rng & Units 04/28/2021 04/27/2021 04/02/2021  Glucose 70 - 99 mg/dL - 123(H) 100(H)  BUN 8 - 23 mg/dL - 20 <5(L)  Creatinine 0.44 - 1.00 mg/dL 0.97 1.01(H) 0.42(L)  BUN/Creat Ratio 12 - 28 - - -  Sodium 135 - 145 mmol/L - 136 137  Potassium 3.5 - 5.1 mmol/L - 3.7 4.5  Chloride 98 - 111 mmol/L - 95(L) 107  CO2 22 - 32 mmol/L - 29 25  Calcium 8.9 - 10.3 mg/dL - 9.8 8.6(L)      NUTRITION - FOCUSED PHYSICAL EXAM: Nutrition-Focused physical exam completed. Findings are mild orbit and thoracic fat depletion, moderate temporal, clavicle muscle depletion, and no edema.     Diet Order:   Diet Order             Diet NPO time specified  Diet effective now                   EDUCATION NEEDS:  Not appropriate for education at this time  Skin:  Skin Assessment: Reviewed RN Assessment  Last BM:  11/30  Height:   Ht Readings from Last 1 Encounters:  04/27/21 5\' 5"  (1.651 m)    Weight:   Wt Readings from Last 1 Encounters:  04/27/21 52.1  kg    Ideal Body Weight:   57 kg  BMI:  Body mass index is 19.11 kg/m.  Estimated Nutritional Needs:   Kcal:  1600-1800  Protein:  67-72 gr  Fluid:  1.6 liters daily   Colman Cater MS,RD,CSG,LDN Contact: Shea Evans

## 2021-04-28 NOTE — ED Notes (Signed)
Patient states abdominal pain started this afternoon with vomiting. States passed small loose bowel movement several hours ago. Recently admitted in October with SBO. States symptoms feel the same. Alert and oriented x4, respirations even and unlabored, NSR on monitor. Family at bedside. Call bell in reach.

## 2021-04-29 ENCOUNTER — Ambulatory Visit: Payer: PPO | Admitting: Family Medicine

## 2021-04-29 ENCOUNTER — Inpatient Hospital Stay (HOSPITAL_COMMUNITY): Payer: PPO

## 2021-04-29 DIAGNOSIS — K56609 Unspecified intestinal obstruction, unspecified as to partial versus complete obstruction: Secondary | ICD-10-CM

## 2021-04-29 LAB — BASIC METABOLIC PANEL WITH GFR
Anion gap: 5 (ref 5–15)
BUN: 18 mg/dL (ref 8–23)
CO2: 25 mmol/L (ref 22–32)
Calcium: 8.1 mg/dL — ABNORMAL LOW (ref 8.9–10.3)
Chloride: 108 mmol/L (ref 98–111)
Creatinine, Ser: 0.48 mg/dL (ref 0.44–1.00)
GFR, Estimated: >60 mL/min
Glucose, Bld: 95 mg/dL (ref 70–99)
Potassium: 4.3 mmol/L (ref 3.5–5.1)
Sodium: 138 mmol/L (ref 135–145)

## 2021-04-29 LAB — CBC
HCT: 38.9 % (ref 36.0–46.0)
Hemoglobin: 12.3 g/dL (ref 12.0–15.0)
MCH: 32 pg (ref 26.0–34.0)
MCHC: 31.6 g/dL (ref 30.0–36.0)
MCV: 101.3 fL — ABNORMAL HIGH (ref 80.0–100.0)
Platelets: 202 10*3/uL (ref 150–400)
RBC: 3.84 MIL/uL — ABNORMAL LOW (ref 3.87–5.11)
RDW: 13.3 % (ref 11.5–15.5)
WBC: 6.4 10*3/uL (ref 4.0–10.5)
nRBC: 0 % (ref 0.0–0.2)

## 2021-04-29 MED ORDER — DIATRIZOATE MEGLUMINE & SODIUM 66-10 % PO SOLN
90.0000 mL | Freq: Once | ORAL | Status: AC
  Administered 2021-04-29: 30 mL via NASOGASTRIC
  Filled 2021-04-29: qty 90

## 2021-04-29 MED ORDER — ENOXAPARIN SODIUM 40 MG/0.4ML IJ SOSY
40.0000 mg | PREFILLED_SYRINGE | INTRAMUSCULAR | Status: DC
  Administered 2021-04-29 – 2021-04-30 (×2): 40 mg via SUBCUTANEOUS
  Filled 2021-04-29 (×2): qty 0.4

## 2021-04-29 MED ORDER — DIATRIZOATE MEGLUMINE & SODIUM 66-10 % PO SOLN
ORAL | Status: AC
  Administered 2021-04-29: 30 mL via ORAL
  Filled 2021-04-29: qty 30

## 2021-04-29 MED ORDER — DIATRIZOATE MEGLUMINE & SODIUM 66-10 % PO SOLN
ORAL | Status: AC
  Administered 2021-04-29: 30 mL via ORAL
  Filled 2021-04-29: qty 60

## 2021-04-29 NOTE — Consult Note (Signed)
Reason for Consult: Small bowel obstruction Referring Physician: Dr. Tessa Lerner is an 80 y.o. Harris.  HPI: Patient is a Terri Harris well-known to our service who last month had been admitted for partial small bowel obstruction.  CT scan of the abdomen at that time revealed a possible narrowing and an anastomosis that was done previously in another facility.  The obstruction resolved with conservative measures and the patient went home.  She now presents back with a 3-day history of worsening nausea, bloating, and abdominal discomfort.  A KUB was performed which revealed recurrence of the small bowel obstruction.  A small NG tube was placed.  She states that since her admission, she has had a bowel movement and her abdomen does feel better.  Past Medical History:  Diagnosis Date   ANA positive 01/2001   Arthritis    Arthritis 07/02/2013   ANA positive   Cancer (HCC)    partial hysterectomy age 83-uterine cancer   GERD (gastroesophageal reflux disease)    Hx of tobacco use, presenting hazards to health 07/02/2013   Hypercholesteremia    Hypertension    Ileus, postoperative ([place]) 07/02/2013   Osteoporosis    Pneumonia    hx of   SBO (small bowel obstruction) ([place]) 06/20/2103    Past Surgical History:  Procedure Laterality Date   ABDOMINAL HYSTERECTOMY     ANTERIOR LAT LUMBAR FUSION N/A 06/08/2014   Procedure: LUMBAR 2-3 LUMBAR 3-4 LUMBAR 4-5 ANTEROLATERAL DECOMPRESSION/FUSION WITH PERCUTANEOUS PEDICLE SCREWS;  Surgeon: Kristeen Miss, MD;  [facility];  Service: Neurosurgery;  Laterality: N/A;  L2-3 L3-4 L4-5 ANTEROLATERAL DECOMPRESSION/FUSION WITH PERCUTANEOUS PEDICLE SCREWS   ANTERIOR LAT LUMBAR FUSION Left 01/07/2019   Procedure: Lumbar one-two Anterolateral lumbar interbody fusion with lateral plate fixation;  Surgeon: Kristeen Miss, MD;  Location: [place];  Service: Neurosurgery;  Laterality: Left;   BACK SURGERY     BOWEL RESECTION N/A 06/20/2013   Procedure:  SMALL BOWEL RESECTION;  Surgeon: Harl Bowie, MD;  Location: [place];  Service: General;  Laterality: N/A;   COLON SURGERY     COLONOSCOPY  10/25/2011   Procedure: COLONOSCOPY;  Surgeon: Rogene Houston, MD;  [facility];  Service: Endoscopy;  Laterality: N/A;  930   COLONOSCOPY N/A 05/09/2017   Procedure: COLONOSCOPY;  Surgeon: Rogene Houston, MD;  [facility];  Service: Endoscopy;  Laterality: N/A;  1030   ESOPHAGEAL DILATION N/A 05/25/2015   Procedure: ESOPHAGEAL DILATION;  Surgeon: Rogene Houston, MD;  [facility];  Service: Endoscopy;  Laterality: N/A;   ESOPHAGOGASTRODUODENOSCOPY  02/2009   ESOPHAGOGASTRODUODENOSCOPY N/A 05/25/2015   Procedure: ESOPHAGOGASTRODUODENOSCOPY (EGD);  Surgeon: Rogene Houston, MD;  [facility];  Service: Endoscopy;  Laterality: N/A;   HERNIA REPAIR Left    groin   INGUINAL HERNIA REPAIR Left 09/11/2013   Procedure: HERNIA REPAIR INGUINAL INCARCERATED;  Surgeon: Harl Bowie, MD;  Location: [place];  Service: General;  Laterality: Left;   LAPAROTOMY N/A 06/20/2013   Procedure: EXPLORATORY LAPAROTOMY;  Surgeon: Harl Bowie, MD;  Location: [place];  Service: General;  Laterality: N/A;   LUMBAR PERCUTANEOUS PEDICLE SCREW 3 LEVEL N/A 06/08/2014   Procedure: LUMBAR PERCUTANEOUS PEDICLE SCREW 3 LEVEL;  Surgeon: Kristeen Miss, MD;  [facility];  Service: Neurosurgery;  Laterality: N/A;   POLYPECTOMY  05/09/2017   Procedure: POLYPECTOMY;  Surgeon: Rogene Houston, MD;  [facility];  Service:  Endoscopy;;  sigmoid colon   TONSILLECTOMY      Family History  Problem Relation Age of Onset   Hypertension Mother    Heart disease Father    Diabetes Sister    Heart disease Sister    Diabetes Brother    Heart disease Brother    Anesthesia problems Neg Hx    Hypotension Neg Hx    Malignant hyperthermia Neg Hx    Pseudochol deficiency Neg Hx     Social History:  reports that she quit  smoking about 9 years ago. Her smoking use included cigarettes. She smoked an average of 0.25 packs per day. She has never used smokeless tobacco. She reports that she does not drink alcohol and does not use drugs.  Allergies:  Allergies  Allergen Reactions   Codeine Nausea And Vomiting   Fosamax [Alendronate Sodium] Other (See Comments)    gastritis    Medications: I have reviewed the patient's current medications. Prior to Admission:  Medications Prior to Admission  Medication Sig Dispense Refill Last Dose   acetaminophen (TYLENOL) 325 MG tablet Take 2 tablets (650 mg total) by mouth every 6 (six) hours as needed for mild pain (or Fever >/= 101). 12 tablet 1    alum & mag hydroxide-simeth (MAALOX/MYLANTA) 200-200-20 MG/5ML suspension Take 15 mLs by mouth every 6 (six) hours as needed for indigestion or heartburn.      amLODipine (NORVASC) 5 MG tablet Take 1 tablet (5 mg total) by mouth daily. 90 tablet 3 04/27/2021   cyanocobalamin 1000 MCG tablet Take 1,000 mcg by mouth daily.   04/27/2021   diphenhydrAMINE (BENADRYL) 25 MG tablet Take 25 mg by mouth every 6 (six) hours as needed.      oxyCODONE-acetaminophen (PERCOCET/ROXICET) 5-325 MG tablet 1 qid prn 120 tablet 0 04/27/2021   pantoprazole (PROTONIX) 40 MG tablet Take 1 tablet (40 mg total) by mouth 2 (two) times daily before a meal. 180 tablet 3 04/27/2021   polyethylene glycol (MIRALAX / GLYCOLAX) 17 g packet Take 17 g by mouth daily. 30 each 0 04/27/2021   zoledronic acid (RECLAST) 5 MG/100ML SOLN injection Inject 5 mg into the vein once. (Patient not taking: Reported on 04/28/2021)   Not Taking    Results for orders placed or performed during the hospital encounter of 04/27/21 (from the past 48 hour(s))  Lipase, blood     Status: None   Collection Time: 04/27/21 11:50 PM  Result Value Ref Range   Lipase 28 11 - 51 U/L    Comment: Performed at [facility], [address] [place], [place] [address]  Comprehensive metabolic panel      Status: Abnormal   Collection Time: 04/27/21 11:50 PM  Result Value Ref Range   Sodium 136 135 - 145 mmol/L   Potassium 3.7 3.5 - 5.1 mmol/L   Chloride 95 (L) 98 - 111 mmol/L   CO2 29 22 - 32 mmol/L   Glucose, Bld 123 (H) 70 - 99 mg/dL    Comment: Glucose reference range applies only to samples taken after fasting for at least 8 hours.   BUN 20 8 - 23 mg/dL   Creatinine, Ser 1.01 (H) 0.44 - 1.00 mg/dL   Calcium 9.8 8.9 - 10.3 mg/dL   Total Protein 7.9 6.5 - 8.1 g/dL   Albumin 4.4 3.5 - 5.0 g/dL   AST Terri 15 - 41 U/L   ALT 14 0 - 44 U/L   Alkaline Phosphatase 81 38 - 126 U/L  Total Bilirubin 1.1 0.3 - 1.2 mg/dL   GFR, Estimated 57 (L) >60 mL/min    Comment: (NOTE) Calculated using the CKD-EPI Creatinine Equation (2021)    Anion gap 12 5 - 15    Comment: Performed at [facility], [address], [place], [place] [address]  CBC     Status: Abnormal   Collection Time: 04/27/21 11:50 PM  Result Value Ref Range   WBC 15.0 (H) 4.0 - 10.5 K/uL   RBC 4.88 3.87 - 5.11 MIL/uL   Hemoglobin 15.9 (H) 12.0 - 15.0 g/dL   HCT 48.4 (H) 36.0 - 46.0 %   MCV 99.2 80.0 - 100.0 fL   MCH 32.6 26.0 - 34.0 pg   MCHC 32.9 30.0 - 36.0 g/dL   RDW 12.9 11.5 - 15.5 %   Platelets 282 150 - 400 K/uL   nRBC 0.0 0.0 - 0.2 %    Comment: Performed at [facility], [address] [place], [place] [address]  Lactic acid, plasma     Status: Abnormal   Collection Time: 04/27/21 11:50 PM  Result Value Ref Range   Lactic Acid, Venous 2.2 (HH) 0.5 - 1.9 mmol/L    Comment: CRITICAL RESULT CALLED TO, READ BACK BY AND VERIFIED WITH: Nichols,[email or web address]  by [email or web address], b 12.1.22 Performed at [facility], [address] [place], [place] [address]   Troponin I (High Sensitivity)     Status: None   Collection Time: 04/27/21 11:50 PM  Result Value Ref Range   Troponin I (High Sensitivity) 6 <18 ng/L    Comment: (NOTE) Elevated high sensitivity troponin I (hsTnI) values and significant  changes across serial  measurements may suggest ACS but many other  chronic and acute conditions are known to elevate hsTnI results.  Refer to the "Links" section for chest pain algorithms and additional  guidance. Performed at [facility], [address], [place], [place] [address]   Lactic acid, plasma     Status: None   Collection Time: 04/28/21 12:55 AM  Result Value Ref Range   Lactic Acid, Venous 1.2 0.5 - 1.9 mmol/L    Comment: Performed at [facility], [address], [place], [place] [address]  Troponin I (High Sensitivity)     Status: None   Collection Time: 04/28/21  1:13 AM  Result Value Ref Range   Troponin I (High Sensitivity) 6 <18 ng/L    Comment: (NOTE) Elevated high sensitivity troponin I (hsTnI) values and significant  changes across serial measurements may suggest ACS but many other  chronic and acute conditions are known to elevate hsTnI results.  Refer to the "Links" section for chest pain algorithms and additional  guidance. Performed at [facility], [address] [place], [place] [address]   CBC     Status: Abnormal   Collection Time: 04/28/21  1:13 AM  Result Value Ref Range   WBC 12.5 (H) 4.0 - 10.5 K/uL   RBC 4.53 3.87 - 5.11 MIL/uL   Hemoglobin 14.9 12.0 - 15.0 g/dL   HCT 45.9 36.0 - 46.0 %   MCV 101.3 (H) 80.0 - 100.0 fL   MCH 32.9 26.0 - 34.0 pg   MCHC 32.5 30.0 - 36.0 g/dL   RDW 13.2 11.5 - 15.5 %   Platelets 256 150 - 400 K/uL   nRBC 0.0 0.0 - 0.2 %    Comment: Performed at [facility], [address] [place], [place] [address]  Creatinine, serum     Status: Abnormal   Collection  Time: 04/28/21  1:13 AM  Result Value Ref Range   Creatinine, Ser 0.97 0.44 - 1.00 mg/dL   GFR, Estimated 59 (L) >60 mL/min    Comment: (NOTE) Calculated using the CKD-EPI Creatinine Equation (2021) Performed at [facility], [address] [place], [place] [address]   Magnesium     Status: Abnormal   Collection Time: 04/28/21  1:13 AM  Result Value Ref Range   Magnesium 2.6 (H)  1.7 - 2.4 mg/dL    Comment: Performed at [facility], [address], [place], [place] [address]  Phosphorus     Status: None   Collection Time: 04/28/21  1:13 AM  Result Value Ref Range   Phosphorus 4.2 2.5 - 4.6 mg/dL    Comment: Performed at [facility], [address], [place], [place] [address]  Resp Panel by RT-PCR (Flu A&B, Covid) Nasopharyngeal Swab     Status: None   Collection Time: 04/28/21  1:45 AM   Specimen: Nasopharyngeal Swab; Nasopharyngeal(NP) swabs in vial transport medium  Result Value Ref Range   SARS Coronavirus 2 by RT PCR NEGATIVE NEGATIVE    Comment: (NOTE) SARS-CoV-2 target nucleic acids are NOT DETECTED.  The SARS-CoV-2 RNA is generally detectable in upper respiratory specimens during the acute phase of infection. The lowest concentration of SARS-CoV-2 viral copies this assay can detect is 138 copies/mL. A negative result does not preclude SARS-Cov-2 infection and should not be used as the sole basis for treatment or other patient management decisions. A negative result may occur with  improper specimen collection/handling, submission of specimen other than nasopharyngeal swab, presence of viral mutation(s) within the areas targeted by this assay, and inadequate number of viral copies(<138 copies/mL). A negative result must be combined with clinical observations, patient history, and epidemiological information. The expected result is Negative.  Fact Sheet for Patients:  EntrepreneurPulse.com.au  Fact Sheet for Healthcare Providers:  IncredibleEmployment.be  This test is no t yet approved or cleared by the [place] FDA and  has been authorized for detection and/or diagnosis of SARS-CoV-2 by FDA under an Emergency Use Authorization (EUA). This EUA will remain  in effect (meaning this test can be used) for the duration of the COVID-19 declaration under Section 564(b)(1) of the Act, 21 U.S.C.section 360bbb-3(b)(1),  unless the authorization is terminated  or revoked sooner.       Influenza A by PCR NEGATIVE NEGATIVE   Influenza B by PCR NEGATIVE NEGATIVE    Comment: (NOTE) The Xpert Xpress SARS-CoV-2/FLU/RSV plus assay is intended as an aid in the diagnosis of influenza from Nasopharyngeal swab specimens and should not be used as a sole basis for treatment. Nasal washings and aspirates are unacceptable for Xpert Xpress SARS-CoV-2/FLU/RSV testing.  Fact Sheet for Patients: EntrepreneurPulse.com.au  Fact Sheet for Healthcare Providers: IncredibleEmployment.be  This test is not yet approved or cleared by the [place] FDA and has been authorized for detection and/or diagnosis of SARS-CoV-2 by FDA under an Emergency Use Authorization (EUA). This EUA will remain in effect (meaning this test can be used) for the duration of the COVID-19 declaration under Section 564(b)(1) of the Act, 21 U.S.C. section 360bbb-3(b)(1), unless the authorization is terminated or revoked.  Performed at [facility], [address] [place], [place] [address]   Basic metabolic panel     Status: Abnormal   Collection Time: 04/29/21  4:30 AM  Result Value Ref Range   Sodium 138 135 - 145 mmol/L   Potassium 4.3 3.5 - 5.1 mmol/L  Chloride 108 98 - 111 mmol/L   CO2 25 22 - 32 mmol/L   Glucose, Bld 95 70 - 99 mg/dL    Comment: Glucose reference range applies only to samples taken after fasting for at least 8 hours.   BUN 18 8 - 23 mg/dL   Creatinine, Ser 0.48 0.44 - 1.00 mg/dL   Calcium 8.1 (L) 8.9 - 10.3 mg/dL   GFR, Estimated >60 >60 mL/min    Comment: (NOTE) Calculated using the CKD-EPI Creatinine Equation (2021)    Anion gap 5 5 - 15    Comment: Performed at [facility], [address], [place], [place] [address]  CBC     Status: Abnormal   Collection Time: 04/29/21  4:30 AM  Result Value Ref Range   WBC 6.4 4.0 - 10.5 K/uL   RBC 3.84 (L) 3.87 - 5.11 MIL/uL    Hemoglobin 12.3 12.0 - 15.0 g/dL   HCT 38.9 36.0 - 46.0 %   MCV 101.3 (H) 80.0 - 100.0 fL   MCH 32.0 26.0 - 34.0 pg   MCHC 31.6 30.0 - 36.0 g/dL   RDW 13.3 11.5 - 15.5 %   Platelets 202 150 - 400 K/uL   nRBC 0.0 0.0 - 0.2 %    Comment: Performed at [facility] [address] [place], [place] [address]    DG Abdomen Acute W/Chest  Result Date: 04/28/2021 CLINICAL DATA:  Abdominal pain. EXAM: DG ABDOMEN ACUTE WITH 1 VIEW CHEST COMPARISON:  Radiograph dated 04/02/2021. FINDINGS: Background of emphysema. No focal consolidation, pleural effusion, pneumothorax. The cardiac silhouette is within normal limits. Diffusely dilated air-filled loops of small bowel measure up to 4.2 cm. No air noted within the colon. No free air. Degenerative changes of the spine and multilevel fusion hardware. No acute osseous pathology. IMPRESSION: Findings consistent with small bowel obstruction. Electronically Signed   By: Anner Crete M.D.   On: 04/28/2021 00:07    ROS:  Pertinent items are noted in HPI.  Blood pressure 123/64, pulse 64, temperature 98 F (36.7 C), resp. rate 20, height 5\' 5"  (1.651 m), weight 52.1 kg, SpO2 95 %. Physical Exam: Pleasant white Harris no acute distress Head is normocephalic, atraumatic Lungs clear to auscultation with good breath sounds bilaterally Heart examination reveals a regular rate and rhythm without S3, S4, murmurs Abdomen is slightly distended with occasional bowel sounds appreciated.  No rigidity is noted.  No significant tenderness is noted.  Assessment/Plan: Impression: Recurrent small bowel obstruction.  Patient has had multiple CT scans in the recent past, thus we will get a small bowel obstruction protocol series.  Further management is pending those results.  I have removed her NG tube as it was not in correct position.  She may be on sips of clears.  If she continues to be symptomatic and the small bowel appears to be obstructed, she may need exploratory  laparotomy.  Aviva Signs 04/29/2021, 11:52 AM

## 2021-04-29 NOTE — Progress Notes (Signed)
PROGRESS NOTE    Terri Harris  EGB:[id number] DOB: [birth year] DOA: 04/27/2021 PCP: Kathyrn Drown, MD   Brief Narrative:   Terri Harris is a 80 y.o. female with medical history significant of COPD, prior history of small bowel obstruction (with history of hysterectomy and hernia repair in the past), hypertension, gastroesophageal reflux disease, hyperlipidemia, osteoporosis and arthritis; who presented to the hospital secondary to abdominal pain, bloating sensation, nausea, and decreased inability to move bowels.  She has been admitted for small bowel obstruction and is being followed by general surgery.  Assessment & Plan:   Principal Problem:   Small bowel obstruction ([place])   1-the small bowel obstruction-recurrent -Patient has presented with abdominal pain, bloating/distention and associated nausea. -Continue IV fluids and advance diet as tolerated -Appreciate general surgery recommendations   2-history of COPD -Appears to be stable -Currently reports no shortness of breath or wheezing -Continue as needed bronchodilators.   3-hypertension -Currently stable -Holding oral antihypertensive agents due to n.p.o. status -As needed labetalol has been ordered.   4-gastroesophageal reflux disease -Continue PPI (IV currently due to n.p.o. status).   5-history of osteoporosis -Resume zoledronic acid injection as an outpatient.   6-history of hyperlipidemia -Currently following diet control only. -Outpatient follow-up with PCP to determine the need of statins.   DVT prophylaxis: Lovenox Code Status: Full Family Communication: Husband at bedside 12/2 Disposition Plan:  Status is: Inpatient  Remains inpatient appropriate because: Requires IV fluids.   Consultants:  General surgery  Procedures:  See below  Antimicrobials:  None   Subjective: Patient seen and evaluated today with no nausea or vomiting or abdominal pain.  She states that she is passing flatus and  has even had a bowel movement this morning.  General surgery allowing some sips of fluid.  NG tube has been removed.  Objective: Vitals:   04/28/21 0809 04/28/21 1213 04/28/21 2004 04/29/21 0517  BP: (!) 143/70 139/76 136/74 123/64  Pulse: 75 83 66 64  Resp: 20 18 19 20   Temp:   98.3 F (36.8 C) 98 F (36.7 C)  TempSrc:      SpO2: 100% 99% 99% 95%  Weight:      Height:        Intake/Output Summary (Last 24 hours) at 04/29/2021 1035 Last data filed at 04/29/2021 0830 Gross per 24 hour  Intake 1691.02 ml  Output 400 ml  Net 1291.02 ml   Filed Weights   04/27/21 2313  Weight: 52.1 kg    Examination:  General exam: Appears calm and comfortable  Respiratory system: Clear to auscultation. Respiratory effort normal. Cardiovascular system: S1 & S2 heard, RRR.  Gastrointestinal system: Abdomen is soft Central nervous system: Alert and awake Extremities: No edema Skin: No significant lesions noted Psychiatry: Flat affect.    Data Reviewed: I have personally reviewed following labs and imaging studies  CBC: Recent Labs  Lab 04/27/21 2350 04/28/21 0113 04/29/21 0430  WBC 15.0* 12.5* 6.4  HGB 15.9* 14.9 12.3  HCT 48.4* 45.9 38.9  MCV 99.2 101.3* 101.3*  PLT 282 256 371   Basic Metabolic Panel: Recent Labs  Lab 04/27/21 2350 04/28/21 0113 04/29/21 0430  NA 136  --  138  K 3.7  --  4.3  CL 95*  --  108  CO2 29  --  25  GLUCOSE 123*  --  95  BUN 20  --  18  CREATININE 1.01* 0.97 0.48  CALCIUM 9.8  --  8.1*  MG  --  2.6*  --   PHOS  --  4.2  --    GFR: Estimated Creatinine Clearance: 46.9 mL/min (by C-G formula based on SCr of 0.48 mg/dL). Liver Function Tests: Recent Labs  Lab 04/27/21 2350  AST 24  ALT 14  ALKPHOS 81  BILITOT 1.1  PROT 7.9  ALBUMIN 4.4   Recent Labs  Lab 04/27/21 2350  LIPASE 28   No results for input(s): AMMONIA in the last 168 hours. Coagulation Profile: No results for input(s): INR, PROTIME in the last 168 hours. Cardiac  Enzymes: No results for input(s): CKTOTAL, CKMB, CKMBINDEX, TROPONINI in the last 168 hours. BNP (last 3 results) No results for input(s): PROBNP in the last 8760 hours. HbA1C: No results for input(s): HGBA1C in the last 72 hours. CBG: No results for input(s): GLUCAP in the last 168 hours. Lipid Profile: No results for input(s): CHOL, HDL, LDLCALC, TRIG, CHOLHDL, LDLDIRECT in the last 72 hours. Thyroid Function Tests: No results for input(s): TSH, T4TOTAL, FREET4, T3FREE, THYROIDAB in the last 72 hours. Anemia Panel: No results for input(s): VITAMINB12, FOLATE, FERRITIN, TIBC, IRON, RETICCTPCT in the last 72 hours. Sepsis Labs: Recent Labs  Lab 04/27/21 2350 04/28/21 0055  LATICACIDVEN 2.2* 1.2    Recent Results (from the past 240 hour(s))  Resp Panel by RT-PCR (Flu A&B, Covid) Nasopharyngeal Swab     Status: None   Collection Time: 04/28/21  1:45 AM   Specimen: Nasopharyngeal Swab; Nasopharyngeal(NP) swabs in vial transport medium  Result Value Ref Range Status   SARS Coronavirus 2 by RT PCR NEGATIVE NEGATIVE Final    Comment: (NOTE) SARS-CoV-2 target nucleic acids are NOT DETECTED.  The SARS-CoV-2 RNA is generally detectable in upper respiratory specimens during the acute phase of infection. The lowest concentration of SARS-CoV-2 viral copies this assay can detect is 138 copies/mL. A negative result does not preclude SARS-Cov-2 infection and should not be used as the sole basis for treatment or other patient management decisions. A negative result may occur with  improper specimen collection/handling, submission of specimen other than nasopharyngeal swab, presence of viral mutation(s) within the areas targeted by this assay, and inadequate number of viral copies(<138 copies/mL). A negative result must be combined with clinical observations, patient history, and epidemiological information. The expected result is Negative.  Fact Sheet for Patients:   EntrepreneurPulse.com.au  Fact Sheet for Healthcare Providers:  IncredibleEmployment.be  This test is no t yet approved or cleared by the [place] FDA and  has been authorized for detection and/or diagnosis of SARS-CoV-2 by FDA under an Emergency Use Authorization (EUA). This EUA will remain  in effect (meaning this test can be used) for the duration of the COVID-19 declaration under Section 564(b)(1) of the Act, 21 U.S.C.section 360bbb-3(b)(1), unless the authorization is terminated  or revoked sooner.       Influenza A by PCR NEGATIVE NEGATIVE Final   Influenza B by PCR NEGATIVE NEGATIVE Final    Comment: (NOTE) The Xpert Xpress SARS-CoV-2/FLU/RSV plus assay is intended as an aid in the diagnosis of influenza from Nasopharyngeal swab specimens and should not be used as a sole basis for treatment. Nasal washings and aspirates are unacceptable for Xpert Xpress SARS-CoV-2/FLU/RSV testing.  Fact Sheet for Patients: EntrepreneurPulse.com.au  Fact Sheet for Healthcare Providers: IncredibleEmployment.be  This test is not yet approved or cleared by the [place] FDA and has been authorized for detection and/or diagnosis of SARS-CoV-2 by FDA under an Emergency Use Authorization (EUA). This EUA will remain in  effect (meaning this test can be used) for the duration of the COVID-19 declaration under Section 564(b)(1) of the Act, 21 U.S.C. section 360bbb-3(b)(1), unless the authorization is terminated or revoked.  Performed at [facility], [address] [place], [place] [address]          Radiology Studies: DG Abdomen Acute W/Chest  Result Date: 04/28/2021 CLINICAL DATA:  Abdominal pain. EXAM: DG ABDOMEN ACUTE WITH 1 VIEW CHEST COMPARISON:  Radiograph dated 04/02/2021. FINDINGS: Background of emphysema. No focal consolidation, pleural effusion, pneumothorax. The cardiac silhouette is within normal  limits. Diffusely dilated air-filled loops of small bowel measure up to 4.2 cm. No air noted within the colon. No free air. Degenerative changes of the spine and multilevel fusion hardware. No acute osseous pathology. IMPRESSION: Findings consistent with small bowel obstruction. Electronically Signed   By: Anner Crete M.D.   On: 04/28/2021 00:07        Scheduled Meds:  enoxaparin (LOVENOX) injection  40 mg Subcutaneous Q24H   pantoprazole (PROTONIX) IV  40 mg Intravenous Daily   Continuous Infusions:  dextrose 5 % and 0.45 % NaCl with KCl 40 mEq/L 75 mL/hr at 04/29/21 0753     LOS: 1 day    Time spent: 35 minutes    Arnie Clingenpeel Darleen Crocker, DO [facility]  If 7PM-7AM, please contact night-coverage [email or web address] 04/29/2021, 10:35 AM

## 2021-04-30 LAB — BASIC METABOLIC PANEL
Anion gap: 6 (ref 5–15)
BUN: 7 mg/dL — ABNORMAL LOW (ref 8–23)
CO2: 25 mmol/L (ref 22–32)
Calcium: 8.5 mg/dL — ABNORMAL LOW (ref 8.9–10.3)
Chloride: 106 mmol/L (ref 98–111)
Creatinine, Ser: 0.44 mg/dL (ref 0.44–1.00)
GFR, Estimated: >60 mL/min (ref >60)
Glucose, Bld: 88 mg/dL (ref 70–99)
Potassium: 3.9 mmol/L (ref 3.5–5.1)
Sodium: 137 mmol/L (ref 135–145)

## 2021-04-30 LAB — MAGNESIUM: Magnesium: 2 mg/dL (ref 1.7–2.4)

## 2021-04-30 MED ORDER — ONDANSETRON HCL 4 MG PO TABS
4.0000 mg | ORAL_TABLET | Freq: Four times a day (QID) | ORAL | 0 refills | Status: DC | PRN
Start: 2021-04-30 — End: 2021-08-09

## 2021-04-30 NOTE — Discharge Summary (Signed)
Physician Discharge Summary  Terri Harris UMP:[id number] DOB: 11-01-1940 DOA: 04/27/2021  PCP: Kathyrn Drown, MD  Admit date: 04/27/2021  Discharge date: 04/30/2021  Admitted From:Home  Disposition:  Home  Recommendations for Outpatient Follow-up:  Follow up with PCP in 1-2 weeks Continue on MiraLAX daily and follow-up with general surgery as advised Continue other home medications as prior  Home Health: None  Equipment/Devices: None  Discharge Condition:Stable  CODE STATUS: Full  Diet recommendation: Advance to full diet slowly  Brief/Interim Summary:  Terri Harris is a 80 y.o. female with medical history significant of COPD, prior history of small bowel obstruction (with history of hysterectomy and hernia repair in the past), hypertension, gastroesophageal reflux disease, hyperlipidemia, osteoporosis and arthritis; who presented to the hospital secondary to abdominal pain, bloating sensation, nausea, and decreased inability to move bowels.  She has been admitted for small bowel obstruction and was seen by general surgery with no need for operative intervention as her symptoms of abdominal distention and pain have resolved.  She is able to have multiple bowel movements overnight with no nausea or vomiting noted.  She would like to go home and advance her diet at home slowly.  She is seen by general surgery with recommendations to continue MiraLAX daily and will have further follow-up outpatient.  No other acute events noted throughout the course of this admission.  Discharge Diagnoses:  Principal Problem:   Small bowel obstruction ([place])  Principal discharge diagnosis: Partial small bowel obstruction resolved.  Discharge Instructions  Discharge Instructions     Diet - low sodium heart healthy   Complete by: As directed    Increase activity slowly   Complete by: As directed       Allergies as of 04/30/2021       Reactions   Codeine Nausea And Vomiting   Fosamax  [alendronate Sodium] Other (See Comments)   gastritis        Medication List     STOP taking these medications    zoledronic acid 5 MG/100ML Soln injection Commonly known as: RECLAST       TAKE these medications    acetaminophen 325 MG tablet Commonly known as: TYLENOL Take 2 tablets (650 mg total) by mouth every 6 (six) hours as needed for mild pain (or Fever >/= 101).   alum & mag hydroxide-simeth 200-200-20 MG/5ML suspension Commonly known as: MAALOX/MYLANTA Take 15 mLs by mouth every 6 (six) hours as needed for indigestion or heartburn.   amLODipine 5 MG tablet Commonly known as: NORVASC Take 1 tablet (5 mg total) by mouth daily.   cyanocobalamin 1000 MCG tablet Take 1,000 mcg by mouth daily.   diphenhydrAMINE 25 MG tablet Commonly known as: BENADRYL Take 25 mg by mouth every 6 (six) hours as needed.   ondansetron 4 MG tablet Commonly known as: ZOFRAN Take 1 tablet (4 mg total) by mouth every 6 (six) hours as needed for nausea.   oxyCODONE-acetaminophen 5-325 MG tablet Commonly known as: PERCOCET/ROXICET 1 qid prn   pantoprazole 40 MG tablet Commonly known as: PROTONIX Take 1 tablet (40 mg total) by mouth 2 (two) times daily before a meal.   polyethylene glycol 17 g packet Commonly known as: MIRALAX / GLYCOLAX Take 17 g by mouth daily.        Follow-up Information     Luking, Elayne Snare, MD. Schedule an appointment as soon as possible for a visit in 1 week(s).   Specialty: Family Medicine Contact information: [address] [place] [address] [phone]                Allergies  Allergen Reactions   Codeine Nausea And Vomiting   Fosamax [Alendronate Sodium] Other (See Comments)    gastritis    Consultations: General surgery   Procedures/Studies: DG Abdomen Acute W/Chest  Result Date: 04/28/2021 CLINICAL DATA:  Abdominal pain. EXAM: DG ABDOMEN ACUTE WITH 1 VIEW CHEST COMPARISON:  Radiograph dated 04/02/2021.  FINDINGS: Background of emphysema. No focal consolidation, pleural effusion, pneumothorax. The cardiac silhouette is within normal limits. Diffusely dilated air-filled loops of small bowel measure up to 4.2 cm. No air noted within the colon. No free air. Degenerative changes of the spine and multilevel fusion hardware. No acute osseous pathology. IMPRESSION: Findings consistent with small bowel obstruction. Electronically Signed   By: Anner Crete M.D.   On: 04/28/2021 00:07   DG ABD ACUTE 2+V W 1V CHEST  Result Date: 04/02/2021 CLINICAL DATA:  Abdominal pain constipation EXAM: DG ABDOMEN ACUTE WITH 1 VIEW CHEST COMPARISON:  03/31/2021 FINDINGS: COPD. Lungs are clear without infiltrate or effusion. Heart size and vascularity normal Normal bowel gas pattern. No obstruction or free air. No urinary tract calculi. Prior back surgery. IMPRESSION: COPD without acute cardiopulmonary abnormality Nonobstructive bowel gas pattern. Electronically Signed   By: Franchot Gallo M.D.   On: 04/02/2021 17:39   DG Abd Portable 1V-Small Bowel Obstruction Protocol-initial, 8 hr delay  Result Date: 04/29/2021 CLINICAL DATA:  Small-bowel obstruction EXAM: PORTABLE ABDOMEN - 1 VIEW COMPARISON:  04/27/2021 FINDINGS: 90 mL of oral contrast given at 1200 hours on 04/29/2021. Current image done at approximately 8 p.m. on the same day. Contrast has passed through the small bowel into the colon. Majority of the contrast is in the left colon extending into the rectum. Colon is mildly distended. Improvement in small bowel dilatation compared with the prior study. Multilevel lumbar fusion unchanged. IMPRESSION: Improvement in small bowel dilatation. Oral contrast enters the left colon at 8 hours. Electronically Signed   By: Franchot Gallo M.D.   On: 04/29/2021 20:37     Discharge Exam: Vitals:   04/29/21 2053 04/30/21 0538  BP: 122/60 128/63  Pulse: 62 60  Resp: 17 14  Temp: 97.8 F (36.6 C) 97.8 F (36.6 C)  SpO2: 96% 96%    Vitals:   04/29/21 0517 04/29/21 1409 04/29/21 2053 04/30/21 0538  BP: 123/64 133/69 122/60 128/63  Pulse: 64 64 62 60  Resp: 20 17 17 14   Temp: 98 F (36.7 C) (!) 97.5 F (36.4 C) 97.8 F (36.6 C) 97.8 F (36.6 C)  TempSrc:  Oral Oral Oral  SpO2: 95% 96% 96% 96%  Weight:      Height:        General: Pt is alert, awake, not in acute distress Cardiovascular: RRR, S1/S2 +, no rubs, no gallops Respiratory: CTA bilaterally, no wheezing, no rhonchi Abdominal: Soft, NT, ND, bowel sounds + Extremities: no edema, no cyanosis    The results of significant diagnostics from this hospitalization (including imaging, microbiology, ancillary and laboratory) are listed below for reference.     Microbiology: Recent Results (from the past 240 hour(s))  Resp Panel by RT-PCR (Flu A&B, Covid) Nasopharyngeal Swab     Status: None   Collection Time: 04/28/21  1:45 AM   Specimen: Nasopharyngeal Swab; Nasopharyngeal(NP) swabs in vial transport medium  Result Value Ref Range Status   SARS Coronavirus 2 by RT PCR NEGATIVE NEGATIVE Final  Comment: (NOTE) SARS-CoV-2 target nucleic acids are NOT DETECTED.  The SARS-CoV-2 RNA is generally detectable in upper respiratory specimens during the acute phase of infection. The lowest concentration of SARS-CoV-2 viral copies this assay can detect is 138 copies/mL. A negative result does not preclude SARS-Cov-2 infection and should not be used as the sole basis for treatment or other patient management decisions. A negative result may occur with  improper specimen collection/handling, submission of specimen other than nasopharyngeal swab, presence of viral mutation(s) within the areas targeted by this assay, and inadequate number of viral copies(<138 copies/mL). A negative result must be combined with clinical observations, patient history, and epidemiological information. The expected result is Negative.  Fact Sheet for Patients:   EntrepreneurPulse.com.au  Fact Sheet for Healthcare Providers:  IncredibleEmployment.be  This test is no t yet approved or cleared by the [place] FDA and  has been authorized for detection and/or diagnosis of SARS-CoV-2 by FDA under an Emergency Use Authorization (EUA). This EUA will remain  in effect (meaning this test can be used) for the duration of the COVID-19 declaration under Section 564(b)(1) of the Act, 21 U.S.C.section 360bbb-3(b)(1), unless the authorization is terminated  or revoked sooner.       Influenza A by PCR NEGATIVE NEGATIVE Final   Influenza B by PCR NEGATIVE NEGATIVE Final    Comment: (NOTE) The Xpert Xpress SARS-CoV-2/FLU/RSV plus assay is intended as an aid in the diagnosis of influenza from Nasopharyngeal swab specimens and should not be used as a sole basis for treatment. Nasal washings and aspirates are unacceptable for Xpert Xpress SARS-CoV-2/FLU/RSV testing.  Fact Sheet for Patients: EntrepreneurPulse.com.au  Fact Sheet for Healthcare Providers: IncredibleEmployment.be  This test is not yet approved or cleared by the [place] FDA and has been authorized for detection and/or diagnosis of SARS-CoV-2 by FDA under an Emergency Use Authorization (EUA). This EUA will remain in effect (meaning this test can be used) for the duration of the COVID-19 declaration under Section 564(b)(1) of the Act, 21 U.S.C. section 360bbb-3(b)(1), unless the authorization is terminated or revoked.  Performed at [facility], [address], [place], [place] [address]      Labs: BNP (last 3 results) No results for input(s): BNP in the last 8760 hours. Basic Metabolic Panel: Recent Labs  Lab 04/27/21 2350 04/28/21 0113 04/29/21 0430 04/30/21 0347  NA 136  --  138 137  K 3.7  --  4.3 3.9  CL 95*  --  108 106  CO2 29  --  25 25  GLUCOSE 123*  --  95 88  BUN 20  --  18 7*   CREATININE 1.01* 0.97 0.48 0.44  CALCIUM 9.8  --  8.1* 8.5*  MG  --  2.6*  --  2.0  PHOS  --  4.2  --   --    Liver Function Tests: Recent Labs  Lab 04/27/21 2350  AST 24  ALT 14  ALKPHOS 81  BILITOT 1.1  PROT 7.9  ALBUMIN 4.4   Recent Labs  Lab 04/27/21 2350  LIPASE 28   No results for input(s): AMMONIA in the last 168 hours. CBC: Recent Labs  Lab 04/27/21 2350 04/28/21 0113 04/29/21 0430  WBC 15.0* 12.5* 6.4  HGB 15.9* 14.9 12.3  HCT 48.4* 45.9 38.9  MCV 99.2 101.3* 101.3*  PLT 282 256 202   Cardiac Enzymes: No results for input(s): CKTOTAL, CKMB, CKMBINDEX, TROPONINI in the last 168 hours. BNP: Invalid input(s): POCBNP CBG: No results  for input(s): GLUCAP in the last 168 hours. D-Dimer No results for input(s): DDIMER in the last 72 hours. Hgb A1c No results for input(s): HGBA1C in the last 72 hours. Lipid Profile No results for input(s): CHOL, HDL, LDLCALC, TRIG, CHOLHDL, LDLDIRECT in the last 72 hours. Thyroid function studies No results for input(s): TSH, T4TOTAL, T3FREE, THYROIDAB in the last 72 hours.  Invalid input(s): FREET3 Anemia work up No results for input(s): VITAMINB12, FOLATE, FERRITIN, TIBC, IRON, RETICCTPCT in the last 72 hours. Urinalysis    Component Value Date/Time   COLORURINE YELLOW 12/28/2014 0333   APPEARANCEUR CLEAR 12/28/2014 0333   LABSPEC 1.007 12/28/2014 0333   PHURINE 7.0 12/28/2014 0333   GLUCOSEU NEGATIVE 12/28/2014 0333   HGBUR NEGATIVE 12/28/2014 0333   BILIRUBINUR NEGATIVE 12/28/2014 0333   KETONESUR NEGATIVE 12/28/2014 0333   PROTEINUR NEGATIVE 12/28/2014 0333   UROBILINOGEN 0.2 12/28/2014 0333   NITRITE NEGATIVE 12/28/2014 0333   LEUKOCYTESUR TRACE (A) 12/28/2014 0333   Sepsis Labs Invalid input(s): PROCALCITONIN,  WBC,  LACTICIDVEN Microbiology Recent Results (from the past 240 hour(s))  Resp Panel by RT-PCR (Flu A&B, Covid) Nasopharyngeal Swab     Status: None   Collection Time: 04/28/21  1:45 AM    Specimen: Nasopharyngeal Swab; Nasopharyngeal(NP) swabs in vial transport medium  Result Value Ref Range Status   SARS Coronavirus 2 by RT PCR NEGATIVE NEGATIVE Final    Comment: (NOTE) SARS-CoV-2 target nucleic acids are NOT DETECTED.  The SARS-CoV-2 RNA is generally detectable in upper respiratory specimens during the acute phase of infection. The lowest concentration of SARS-CoV-2 viral copies this assay can detect is 138 copies/mL. A negative result does not preclude SARS-Cov-2 infection and should not be used as the sole basis for treatment or other patient management decisions. A negative result may occur with  improper specimen collection/handling, submission of specimen other than nasopharyngeal swab, presence of viral mutation(s) within the areas targeted by this assay, and inadequate number of viral copies(<138 copies/mL). A negative result must be combined with clinical observations, patient history, and epidemiological information. The expected result is Negative.  Fact Sheet for Patients:  EntrepreneurPulse.com.au  Fact Sheet for Healthcare Providers:  IncredibleEmployment.be  This test is no t yet approved or cleared by the [place] FDA and  has been authorized for detection and/or diagnosis of SARS-CoV-2 by FDA under an Emergency Use Authorization (EUA). This EUA will remain  in effect (meaning this test can be used) for the duration of the COVID-19 declaration under Section 564(b)(1) of the Act, 21 U.S.C.section 360bbb-3(b)(1), unless the authorization is terminated  or revoked sooner.       Influenza A by PCR NEGATIVE NEGATIVE Final   Influenza B by PCR NEGATIVE NEGATIVE Final    Comment: (NOTE) The Xpert Xpress SARS-CoV-2/FLU/RSV plus assay is intended as an aid in the diagnosis of influenza from Nasopharyngeal swab specimens and should not be used as a sole basis for treatment. Nasal washings and aspirates are  unacceptable for Xpert Xpress SARS-CoV-2/FLU/RSV testing.  Fact Sheet for Patients: EntrepreneurPulse.com.au  Fact Sheet for Healthcare Providers: IncredibleEmployment.be  This test is not yet approved or cleared by the [place] FDA and has been authorized for detection and/or diagnosis of SARS-CoV-2 by FDA under an Emergency Use Authorization (EUA). This EUA will remain in effect (meaning this test can be used) for the duration of the COVID-19 declaration under Section 564(b)(1) of the Act, 21 U.S.C. section 360bbb-3(b)(1), unless the authorization is terminated or revoked.  Performed at  [address] [place], [place] [address]      Time coordinating discharge: 35 minutes  SIGNED:   Rodena Goldmann, DO [facility] 04/30/2021, 10:26 AM  If 7PM-7AM, please contact night-coverage [email or web address]

## 2021-04-30 NOTE — Progress Notes (Signed)
  Subjective: Patient's abdominal distention has resolved.  She has no abdominal pain.  She has had multiple bowel movements.  Objective: Vital signs in last 24 hours: Temp:  [97.5 F (36.4 C)-97.8 F (36.6 C)] 97.8 F (36.6 C) (12/03 0538) Pulse Rate:  [60-64] 60 (12/03 0538) Resp:  [14-17] 14 (12/03 0538) BP: (122-133)/(60-69) 128/63 (12/03 0538) SpO2:  [96 %] 96 % (12/03 0538) Last BM Date: 04/29/21  Intake/Output from previous day: 12/02 0701 - 12/03 0700 In: 2754.1 [I.V.:2754.1] Out: -  Intake/Output this shift: No intake/output data recorded.  General appearance: alert, cooperative, and no distress GI: soft, non-tender; bowel sounds normal; no masses,  no organomegaly  Lab Results:  Recent Labs    04/28/21 0113 04/29/21 0430  WBC 12.5* 6.4  HGB 14.9 12.3  HCT 45.9 38.9  PLT 256 202   BMET Recent Labs    04/29/21 0430 04/30/21 0347  NA 138 137  K 4.3 3.9  CL 108 106  CO2 25 25  GLUCOSE 95 88  BUN 18 7*  CREATININE 0.48 0.44  CALCIUM 8.1* 8.5*   PT/INR No results for input(s): LABPROT, INR in the last 72 hours.  Studies/Results: DG Abd Portable 1V-Small Bowel Obstruction Protocol-initial, 8 hr delay  Result Date: 04/29/2021 CLINICAL DATA:  Small-bowel obstruction EXAM: PORTABLE ABDOMEN - 1 VIEW COMPARISON:  04/27/2021 FINDINGS: 90 mL of oral contrast given at 1200 hours on 04/29/2021. Current image done at approximately 8 p.m. on the same day. Contrast has passed through the small bowel into the colon. Majority of the contrast is in the left colon extending into the rectum. Colon is mildly distended. Improvement in small bowel dilatation compared with the prior study. Multilevel lumbar fusion unchanged. IMPRESSION: Improvement in small bowel dilatation. Oral contrast enters the left colon at 8 hours. Electronically Signed   By: Franchot Gallo M.D.   On: 04/29/2021 20:37    Anti-infectives: Anti-infectives (From admission, onward)    None        Assessment/Plan: Impression: Partial small bowel obstruction, resolved.  No need for surgical intervention at this time.  Patient would like to be discharged.  That is fine with me.  I did give the patient some instructions on how to avoid coming back to the emergency room immediately should she develop abdominal distention.  She may have some component of a partial small bowel obstruction versus bowel motility dysfunction.  She should continue her MiraLAX daily.  Discussed with Dr. Manuella Ghazi.  LOS: 2 days    Aviva Signs 04/30/2021

## 2021-04-30 NOTE — Plan of Care (Signed)

## 2021-05-02 ENCOUNTER — Telehealth: Payer: Self-pay

## 2021-05-02 DIAGNOSIS — K56609 Unspecified intestinal obstruction, unspecified as to partial versus complete obstruction: Secondary | ICD-10-CM

## 2021-05-02 DIAGNOSIS — D539 Nutritional anemia, unspecified: Secondary | ICD-10-CM

## 2021-05-02 NOTE — Telephone Encounter (Signed)
Transition Care Management Follow-up Telephone Call Date of discharge and from where: 04/30/2021 [facility]  Diagnosis: Small Bowel Obstruction How have you been since you were released from the hospital? Pt states she is feeling better.  Any questions or concerns? No  Items Reviewed: Did the pt receive and understand the discharge instructions provided? Yes  Medications obtained and verified? Yes  Other? No  Any new allergies since your discharge? No  Dietary orders reviewed? Yes Do you have support at home? Yes   Home Care and Equipment/Supplies: Were home health services ordered? not applicable If so, what is the name of the agency? N/A  Has the agency set up a time to come to the patient's home? not applicable Were any new equipment or medical supplies ordered?  No What is the name of the medical supply agency? N/A Were you able to get the supplies/equipment? not applicable Do you have any questions related to the use of the equipment or supplies? No  Functional Questionnaire: (I = Independent and D = Dependent) ADLs: I  Bathing/Dressing- I  Meal Prep- I  Eating- I  Maintaining continence- I  Transferring/Ambulation- I  Managing Meds- I  Follow up appointments reviewed:  PCP Hospital f/u appt confirmed? Yes  Scheduled to see Dr. Wolfgang Phoenix on 05/12/19 @ 9. [facility] f/u appt confirmed?  N/A   Are transportation arrangements needed? No  If their condition worsens, is the pt aware to call PCP or go to the Emergency Dept.? Yes Was the patient provided with contact information for the PCP's office or ED? Yes Was to pt encouraged to call back with questions or concerns? Yes

## 2021-05-02 NOTE — Addendum Note (Signed)
Addended by: Dairl Ponder on: 05/02/2021 04:49 PM   Modules accepted: Orders

## 2021-05-02 NOTE — Telephone Encounter (Addendum)
Patient notified she may restart Repatha - patient states her next dose is due 05/19/21 Blood work ordered in [employer]. Patient notified.

## 2021-05-02 NOTE — Telephone Encounter (Signed)
While doing TOC for pt, pt states she was told to hold her Repatha injection. Pt asks why that would need to be done? Her next dose is 12/22. I could not find any information in her discharge summary as to why. Can you advise? Thank you.

## 2021-05-02 NOTE — Telephone Encounter (Signed)
There is no reason to hold the Repatha she may restart it  She will need to have CBC, CMP, vitamin B12 before her follow-up visit Diagnosis macrocytic anemia, small bowel obstruction, hypocalcemia

## 2021-05-05 DIAGNOSIS — D539 Nutritional anemia, unspecified: Secondary | ICD-10-CM | POA: Diagnosis not present

## 2021-05-05 DIAGNOSIS — K56609 Unspecified intestinal obstruction, unspecified as to partial versus complete obstruction: Secondary | ICD-10-CM | POA: Diagnosis not present

## 2021-05-06 LAB — COMPREHENSIVE METABOLIC PANEL
ALT: 13 IU/L (ref 0–32)
AST: 24 IU/L (ref 0–40)
Albumin/Globulin Ratio: 1.9 (ref 1.2–2.2)
Albumin: 4.7 g/dL (ref 3.7–4.7)
Alkaline Phosphatase: 82 IU/L (ref 44–121)
BUN/Creatinine Ratio: 16 (ref 12–28)
BUN: 10 mg/dL (ref 8–27)
Bilirubin Total: 0.3 mg/dL (ref 0.0–1.2)
CO2: 24 mmol/L (ref 20–29)
Calcium: 9.3 mg/dL (ref 8.7–10.3)
Chloride: 102 mmol/L (ref 96–106)
Creatinine, Ser: 0.62 mg/dL (ref 0.57–1.00)
Globulin, Total: 2.5 g/dL (ref 1.5–4.5)
Glucose: 75 mg/dL (ref 70–99)
Potassium: 4.3 mmol/L (ref 3.5–5.2)
Sodium: 140 mmol/L (ref 134–144)
Total Protein: 7.2 g/dL (ref 6.0–8.5)
eGFR: 91 mL/min/{1.73_m2} (ref >59)

## 2021-05-06 LAB — CBC WITH DIFFERENTIAL/PLATELET
Basophils Absolute: 0.1 10*3/uL (ref 0.0–0.2)
Basos: 1 %
EOS (ABSOLUTE): 0.5 10*3/uL — ABNORMAL HIGH (ref 0.0–0.4)
Eos: 7 %
Hematocrit: 39.7 % (ref 34.0–46.6)
Hemoglobin: 13.3 g/dL (ref 11.1–15.9)
Immature Grans (Abs): 0 10*3/uL (ref 0.0–0.1)
Immature Granulocytes: 0 %
Lymphocytes Absolute: 1.4 10*3/uL (ref 0.7–3.1)
Lymphs: 21 %
MCH: 31.9 pg (ref 26.6–33.0)
MCHC: 33.5 g/dL (ref 31.5–35.7)
MCV: 95 fL (ref 79–97)
Monocytes Absolute: 0.6 10*3/uL (ref 0.1–0.9)
Monocytes: 8 %
Neutrophils Absolute: 4.2 10*3/uL (ref 1.4–7.0)
Neutrophils: 63 %
Platelets: 279 10*3/uL (ref 150–450)
RBC: 4.17 x10E6/uL (ref 3.77–5.28)
RDW: 12.3 % (ref 11.7–15.4)
WBC: 6.7 10*3/uL (ref 3.4–10.8)

## 2021-05-06 LAB — VITAMIN B12: Vitamin B-12: 1320 pg/mL — ABNORMAL HIGH (ref 232–1245)

## 2021-05-11 ENCOUNTER — Other Ambulatory Visit: Payer: Self-pay

## 2021-05-11 ENCOUNTER — Encounter: Payer: Self-pay | Admitting: Family Medicine

## 2021-05-11 ENCOUNTER — Ambulatory Visit (INDEPENDENT_AMBULATORY_CARE_PROVIDER_SITE_OTHER): Payer: PPO | Admitting: Family Medicine

## 2021-05-11 VITALS — BP 120/68 | HR 79 | Temp 97.2°F | Ht 65.0 in | Wt 111.0 lb

## 2021-05-11 DIAGNOSIS — K56600 Partial intestinal obstruction, unspecified as to cause: Secondary | ICD-10-CM

## 2021-05-11 MED ORDER — OXYCODONE-ACETAMINOPHEN 5-325 MG PO TABS
ORAL_TABLET | ORAL | 0 refills | Status: DC
Start: 2021-05-11 — End: 2021-08-09

## 2021-05-11 MED ORDER — OXYCODONE-ACETAMINOPHEN 5-325 MG PO TABS: ORAL_TABLET | ORAL | 0 refills | Status: DC

## 2021-05-11 NOTE — Progress Notes (Signed)
Subjective:    Patient ID: Terri Harris, female    DOB: 09/20/1940, 80 y.o.   MRN: [id number]  [facility] follow up bowel obstruction , no current concerns   This patient was seen today for chronic pain  The medication list was reviewed and updated.  Location of Pain for which the patient has been treated with regarding narcotics: Mid back pain low back pain with sciatica  Onset of this pain: chronic   -Compliance with medication: daily  - Number patient states they take daily: 3 to 4   -when was the last dose patient took? yesterday  The patient was advised the importance of maintaining medication and not using illegal substances with these.  Here for refills and follow up  The patient was educated that we can provide 3 monthly scripts for their medication, it is their responsibility to follow the instructions.  Side effects or complications from medications: no  Patient is aware that pain medications are meant to minimize the severity of the pain to allow their pain levels to improve to allow for better function. They are aware of that pain medications cannot totally remove their pain.  Due for UDT ( at least once per year) : 11/2020  Scale of 1 to 10 ( 1 is least 10 is most) Your pain level without the medicine: 8 Your pain level with medication 4  Scale 1 to 10 ( 1-helps very little, 10 helps very well) How well does your pain medication reduce your pain so you can function better through out the day? 5  Quality of the pain: achy back  Persistence of the pain: all the time  Modifying factors: none  Also recently in the hospital with small bowel partial obstruction this is been 2 separate times.  It does appear that she may end up having to have further surgery.  She has seen Dr. Ninfa Linden in the past and would like to have a follow-up appointment with him      Review of Systems     Objective:   Physical Exam Lungs clear respiratory rate normal heart  regular abdomen soft no guarding rebound or tenderness extremities no edema       Assessment & Plan:  Partial small bowel obstruction-she had previous surgery with Dr. Ninfa Linden with [facility] surgery she is requesting referral we will go ahead with referral if she has obstruction occur very important to go to ER  Patient to use MiraLAX and stool softeners to keep bowel movements soft and regular  Try to minimize pain medicine to no more than 3/day if possible to lessen the effects on her intestines  The patient was seen in followup for chronic pain. A review over at their current pain status was discussed. Drug registry was checked. Prescriptions were given.  Regular follow-up recommended. Discussion was held regarding the importance of compliance with medication as well as pain medication contract.  Patient was informed that medication may cause drowsiness and should not be combined  with other medications/alcohol or street drugs. If the patient feels medication is causing altered alertness then do not drive or operate dangerous equipment.  Should be noted that the patient appears to be meeting appropriate use of opioids and response.  Evidenced by improved function and decent pain control without significant side effects and no evidence of overt aberrancy issues.  Upon discussion with the patient today they understand that opioid therapy is optional and they feel that the pain has been refractory  to reasonable conservative measures and is significant and affecting quality of life enough to warrant ongoing therapy and wishes to continue opioids.  Refills were provided.  Labs reviewed no further lab work indicated currently

## 2021-05-12 ENCOUNTER — Other Ambulatory Visit: Payer: Self-pay | Admitting: Family Medicine

## 2021-05-12 DIAGNOSIS — K56600 Partial intestinal obstruction, unspecified as to cause: Secondary | ICD-10-CM

## 2021-05-19 ENCOUNTER — Encounter (HOSPITAL_COMMUNITY): Payer: Self-pay

## 2021-05-19 ENCOUNTER — Other Ambulatory Visit: Payer: Self-pay

## 2021-05-19 ENCOUNTER — Encounter (HOSPITAL_COMMUNITY)
Admission: RE | Admit: 2021-05-19 | Discharge: 2021-05-19 | Disposition: A | Discharge status: Home / Self Care | Payer: PPO | Source: Ambulatory Visit | Attending: Family Medicine | Admitting: Family Medicine

## 2021-05-19 DIAGNOSIS — M810 Age-related osteoporosis without current pathological fracture: Secondary | ICD-10-CM | POA: Insufficient documentation

## 2021-05-19 MED ORDER — ZOLEDRONIC ACID 5 MG/100ML IV SOLN
5.0000 mg | Freq: Once | INTRAVENOUS | Status: AC
  Administered 2021-05-19: 13:00:00 5 mg via INTRAVENOUS

## 2021-05-19 MED ORDER — SODIUM CHLORIDE 0.9 % IV SOLN: Freq: Once | INTRAVENOUS | Status: AC

## 2021-05-19 MED ORDER — ZOLEDRONIC ACID 5 MG/100ML IV SOLN
INTRAVENOUS | Status: AC
  Filled 2021-05-19: qty 100

## 2021-06-17 ENCOUNTER — Other Ambulatory Visit: Payer: Self-pay | Admitting: Family Medicine

## 2021-06-28 DIAGNOSIS — Z8719 Personal history of other diseases of the digestive system: Secondary | ICD-10-CM | POA: Diagnosis not present

## 2021-06-29 ENCOUNTER — Ambulatory Visit (INDEPENDENT_AMBULATORY_CARE_PROVIDER_SITE_OTHER): Payer: PPO | Admitting: *Deleted

## 2021-06-29 DIAGNOSIS — I10 Essential (primary) hypertension: Secondary | ICD-10-CM

## 2021-06-29 DIAGNOSIS — J449 Chronic obstructive pulmonary disease, unspecified: Secondary | ICD-10-CM

## 2021-06-29 NOTE — Chronic Care Management (AMB) (Signed)
Chronic Care Management   CCM RN Visit Note  06/29/2021 Name: Terri Harris MRN: [id number] DOB: 31-Aug-1940  Subjective: Terri Harris is a 81 y.o. year old female who is a primary care patient of Luking, Elayne Snare, MD. The care management team was consulted for assistance with disease management and care coordination needs.    Engaged with patient by telephone for follow up visit in response to provider referral for case management and/or care coordination services.   Consent to Services:  The patient was given information about Chronic Care Management services, agreed to services, and gave verbal consent prior to initiation of services.  Please see initial visit note for detailed documentation.   Patient agreed to services and verbal consent obtained.   Assessment: Review of patient past medical history, allergies, medications, health status, including review of consultants reports, laboratory and other test data, was performed as part of comprehensive evaluation and provision of chronic care management services.   SDOH (Social Determinants of Health) assessments and interventions performed:    CCM Care Plan  Allergies  Allergen Reactions   Codeine Nausea And Vomiting   Fosamax [Alendronate Sodium] Other (See Comments)    gastritis    Outpatient Encounter Medications as of 06/29/2021  Medication Sig   acetaminophen (TYLENOL) 325 MG tablet Take 2 tablets (650 mg total) by mouth every 6 (six) hours as needed for mild pain (or Fever >/= 101).   alum & mag hydroxide-simeth (MAALOX/MYLANTA) 200-200-20 MG/5ML suspension Take 15 mLs by mouth every 6 (six) hours as needed for indigestion or heartburn.   amLODipine (NORVASC) 5 MG tablet TAKE 1 TABLET BY MOUTH EVERY DAY   cyanocobalamin 1000 MCG tablet Take 1,000 mcg by mouth daily.   diphenhydrAMINE (BENADRYL) 25 MG tablet Take 25 mg by mouth every 6 (six) hours as needed.   oxyCODONE-acetaminophen (PERCOCET/ROXICET) 5-325 MG tablet 1 pill  taken 3-4 times per day as needed for pain to last 30 days   pantoprazole (PROTONIX) 40 MG tablet Take 1 tablet (40 mg total) by mouth 2 (two) times daily before a meal.   polyethylene glycol (MIRALAX / GLYCOLAX) 17 g packet Take 17 g by mouth daily.   ondansetron (ZOFRAN) 4 MG tablet Take 1 tablet (4 mg total) by mouth every 6 (six) hours as needed for nausea. (Patient not taking: Reported on 05/11/2021)   oxyCODONE-acetaminophen (PERCOCET/ROXICET) 5-325 MG tablet 1 pill taken 3 or 4 times daily as needed for pain to last 30 days (Patient not taking: Reported on 06/29/2021)   oxyCODONE-acetaminophen (PERCOCET/ROXICET) 5-325 MG tablet 1 taken 3 times daily as needed for pain (Patient not taking: Reported on 06/29/2021)   No facility-administered encounter medications on file as of 06/29/2021.    Patient Active Problem List   Diagnosis Date Noted   Small bowel obstruction ([place]) 04/28/2021   COVID-19 virus infection 03/28/2021   Vitamin B12 deficiency 12/09/2019   Lumbar stenosis 01/07/2019   Hx of colonic polyps 12/22/2016   Nocturnal headaches 01/26/2016   Dysphagia 05/06/2015   Hyperlipidemia 04/19/2015   Chronic lumbar pain 04/19/2015   Lumbar stenosis with neurogenic claudication 06/08/2014   HTN (hypertension) 03/27/2014   Incarcerated femoral hernia 09/11/2013   Ileus, postoperative ([place]) 07/02/2013   GERD (gastroesophageal reflux disease) 07/02/2013   Hx of tobacco use, presenting hazards to health 07/02/2013   Arthritis 07/02/2013   SBO (small bowel obstruction) ([place]) 06/01/2013   Dehydration 06/01/2013   Gastritis 04/13/2013   Abdominal pain, unspecified site 04/13/2013  Osteoporosis 04/13/2013   Personal history of colonic polyps 04/13/2013   COPD (chronic obstructive pulmonary disease) ([place]) 04/13/2013    Conditions to be addressed/monitored:HTN and COPD  Care Plan : RN Care Manager plan of care  Updates made by Kassie Mends, RN since 06/29/2021 12:00 AM     Problem:  No plan of care established for management of chronic disease states (COPD, HTN)   Priority: High     Long-Range Goal: Development of plan of care for chronic disease management (COPD, HTN)   Start Date: 04/20/2021  Expected End Date: 10/17/2021  Priority: High  Note:   Current Barriers:  Knowledge Deficits related to plan of care for management of HTN and COPD - patient reports she lives with spouse and her adult daughter, has blood pressure cuff but does not check blood pressure.  Does not have advanced directives and declines information. Patient needs education/ reinforcement for COPD/ HTN management, action plans and diet.  Patient hospitalized 04/27/21 for small bowel obstruction and reports feeling better, using miralax daily, saw Dr. Delrae Alfred and will be following up again about potentially having surgery, states " I have a narrow place in there".  RNCM Clinical Goal(s):  Patient will verbalize understanding of plan for management of HTN and COPD as evidenced by patient report, review EHR and  through collaboration with RN Care manager, provider, and care team.   Interventions: 1:1 collaboration with primary care provider regarding development and update of comprehensive plan of care as evidenced by provider attestation and co-signature Inter-disciplinary care team collaboration (see longitudinal plan of care) Evaluation of current treatment plan related to  self management and patient's adherence to plan as established by provider  Hypertension Interventions: Last practice recorded BP readings:  BP Readings from Last 3 Encounters:  04/03/21 117/61  12/06/20 127/84  09/21/20 137/74  Most recent eGFR/CrCl:  Lab Results  Component Value Date   EGFR 90 08/30/2020    No components found for: CRCL  Evaluation of current treatment plan related to hypertension self management and patient's adherence to plan as established by provider Reviewed medications with patient and  discussed importance of compliance Reviewed importance of monitoring blood pressure and keeping a log Reviewed upcoming scheduled appointments   COPD Interventions:   Provided patient with basic written and verbal COPD education on self care/management/and exacerbation prevention Advised patient to track and manage COPD triggers Advised patient to self assesses COPD action plan zone and make appointment with provider if in the yellow zone for 48 hours without improvement Provided education about and advised patient to utilize infection prevention strategies to reduce risk of respiratory infection Discussed the importance of adequate rest and management of fatigue with COPD  Reviewed importance of calling doctor early on for change in health status, symptoms such as cough, fever Reviewed importance of taking miralax as prescribed   Patient Goals/Self-Care Activities: Take medications as prescribed   Attend all scheduled provider appointments Perform all self care activities independently  Perform IADL's (shopping, preparing meals, housekeeping, managing finances) independently Call provider office for new concerns or questions  - identify and remove indoor air pollutants - limit outdoor activity during cold weather - follow rescue plan if symptoms flare-up - keep follow-up appointments: primary care provider 08/09/21 - get at least 7 to 8 hours of sleep at night - do breathing exercises every day check blood pressure weekly choose a place to take my blood pressure (home, clinic or office, retail store) write blood pressure results  in a log or diary take blood pressure log to all doctor appointments keep all doctor appointments take medications for blood pressure exactly as prescribed report new symptoms to your doctor Take miralax as prescribed  Follow Up Plan:  Telephone follow up appointment with care management team member scheduled for:  08/24/21       Plan:Telephone follow  up appointment with care management team member scheduled for:  08/24/21  Jacqlyn Larsen [facility], BSN RN Case Manager [place] [phone]

## 2021-06-29 NOTE — Patient Instructions (Signed)
Visit Information  Thank you for taking time to visit with me today. Please don't hesitate to contact me if I can be of assistance to you before our next scheduled telephone appointment.  Following are the goals we discussed today:  Take medications as prescribed   Attend all scheduled provider appointments Perform all self care activities independently  Perform IADL's (shopping, preparing meals, housekeeping, managing finances) independently Call provider office for new concerns or questions  identify and remove indoor air pollutants limit outdoor activity during cold weather follow rescue plan if symptoms flare-up keep follow-up appointments: primary care provider 08/09/21 get at least 7 to 8 hours of sleep at night do breathing exercises every day check blood pressure weekly choose a place to take my blood pressure (home, clinic or office, retail store) write blood pressure results in a log or diary take blood pressure log to all doctor appointments keep all doctor appointments take medications for blood pressure exactly as prescribed report new symptoms to your doctor Take miralax as prescribed  Our next appointment is by telephone on 08/24/21 at 1045 am  Please call the care guide team at [phone] if you need to cancel or reschedule your appointment.   If you are experiencing a Mental Health or [place] or need someone to talk to, please call the Suicide and Crisis Lifeline: 988 call the [place] National Suicide Prevention Lifeline: 501-723-4301 or TTY: (731)112-8734 TTY 9032449376) to talk to a trained counselor call 1-800-273-TALK (toll free, 24 hour hotline) go to [facility] [address], [place] [phone]) call the [place]: [phone] call 911   The patient verbalized understanding of instructions, educational materials, and care plan provided today and declined offer to receive copy  of patient instructions, educational materials, and care plan.   Jacqlyn Larsen [facility], BSN RN Case Manager [facility] [phone]

## 2021-07-21 ENCOUNTER — Emergency Department (HOSPITAL_COMMUNITY): Payer: PPO

## 2021-07-21 ENCOUNTER — Other Ambulatory Visit: Payer: Self-pay

## 2021-07-21 ENCOUNTER — Encounter (HOSPITAL_COMMUNITY): Payer: Self-pay | Admitting: Emergency Medicine

## 2021-07-21 ENCOUNTER — Emergency Department (HOSPITAL_COMMUNITY)
Admission: EM | Admit: 2021-07-21 | Discharge: 2021-07-21 | Disposition: A | Discharge status: Home / Self Care | Payer: PPO | Attending: Emergency Medicine | Admitting: Emergency Medicine

## 2021-07-21 DIAGNOSIS — R0781 Pleurodynia: Secondary | ICD-10-CM | POA: Diagnosis not present

## 2021-07-21 DIAGNOSIS — R079 Chest pain, unspecified: Secondary | ICD-10-CM | POA: Diagnosis not present

## 2021-07-21 DIAGNOSIS — Y92410 Unspecified street and highway as the place of occurrence of the external cause: Secondary | ICD-10-CM | POA: Diagnosis not present

## 2021-07-21 DIAGNOSIS — J439 Emphysema, unspecified: Secondary | ICD-10-CM | POA: Diagnosis not present

## 2021-07-21 DIAGNOSIS — S20319A Abrasion of unspecified front wall of thorax, initial encounter: Secondary | ICD-10-CM | POA: Diagnosis not present

## 2021-07-21 DIAGNOSIS — M7918 Myalgia, other site: Secondary | ICD-10-CM

## 2021-07-21 DIAGNOSIS — S299XXA Unspecified injury of thorax, initial encounter: Secondary | ICD-10-CM | POA: Diagnosis present

## 2021-07-21 DIAGNOSIS — M791 Myalgia, unspecified site: Secondary | ICD-10-CM | POA: Insufficient documentation

## 2021-07-21 MED ORDER — IBUPROFEN 800 MG PO TABS
800.0000 mg | ORAL_TABLET | Freq: Once | ORAL | Status: AC
  Administered 2021-07-21: 800 mg via ORAL
  Filled 2021-07-21: qty 1
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
## 2021-07-26 DIAGNOSIS — I10 Essential (primary) hypertension: Secondary | ICD-10-CM | POA: Diagnosis not present

## 2021-07-26 DIAGNOSIS — J449 Chronic obstructive pulmonary disease, unspecified: Secondary | ICD-10-CM

## 2021-08-02 ENCOUNTER — Other Ambulatory Visit: Payer: Self-pay | Admitting: Surgery

## 2021-08-02 DIAGNOSIS — K56609 Unspecified intestinal obstruction, unspecified as to partial versus complete obstruction: Secondary | ICD-10-CM | POA: Diagnosis not present

## 2021-08-09 ENCOUNTER — Other Ambulatory Visit: Payer: Self-pay

## 2021-08-09 ENCOUNTER — Encounter: Payer: Self-pay | Admitting: Family Medicine

## 2021-08-09 ENCOUNTER — Ambulatory Visit (INDEPENDENT_AMBULATORY_CARE_PROVIDER_SITE_OTHER): Payer: PPO | Admitting: Family Medicine

## 2021-08-09 VITALS — BP 128/70 | Temp 97.2°F | Wt 115.6 lb

## 2021-08-09 DIAGNOSIS — R0789 Other chest pain: Secondary | ICD-10-CM

## 2021-08-09 DIAGNOSIS — Z79891 Long term (current) use of opiate analgesic: Secondary | ICD-10-CM

## 2021-08-09 DIAGNOSIS — R079 Chest pain, unspecified: Secondary | ICD-10-CM | POA: Diagnosis not present

## 2021-08-09 DIAGNOSIS — D692 Other nonthrombocytopenic purpura: Secondary | ICD-10-CM | POA: Diagnosis not present

## 2021-08-09 MED ORDER — ONDANSETRON HCL 8 MG PO TABS: 8.0000 mg | ORAL_TABLET | Freq: Three times a day (TID) | ORAL | 3 refills | Status: DC | PRN

## 2021-08-09 MED ORDER — OXYCODONE-ACETAMINOPHEN 5-325 MG PO TABS: ORAL_TABLET | ORAL | 0 refills | Status: DC

## 2021-08-09 MED ORDER — OXYCODONE-ACETAMINOPHEN 5-325 MG PO TABS
ORAL_TABLET | ORAL | 0 refills | Status: DC
Start: 2021-08-09 — End: 2021-08-09
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
## 2021-08-09 NOTE — Patient Instructions (Signed)
We will set up the CAT scan and let you know when it is ?

## 2021-08-11 ENCOUNTER — Telehealth: Payer: Self-pay | Admitting: Family Medicine

## 2021-08-11 NOTE — Telephone Encounter (Signed)
error 

## 2021-08-24 ENCOUNTER — Ambulatory Visit (INDEPENDENT_AMBULATORY_CARE_PROVIDER_SITE_OTHER): Payer: PPO | Admitting: *Deleted

## 2021-08-24 DIAGNOSIS — I10 Essential (primary) hypertension: Secondary | ICD-10-CM

## 2021-08-24 DIAGNOSIS — J449 Chronic obstructive pulmonary disease, unspecified: Secondary | ICD-10-CM

## 2021-08-24 NOTE — Patient Instructions (Signed)
Visit Information ? ?Thank you for taking time to visit with me today. Please don't hesitate to contact me if I can be of assistance to you before our next scheduled telephone appointment. ? ?Following are the goals we discussed today:  ?Take medications as prescribed   ?Attend all scheduled provider appointments ?Perform all self care activities independently  ?Perform IADL's (shopping, preparing meals, housekeeping, managing finances) independently ?Call provider office for new concerns or questions  ?identify and remove indoor air pollutants ?do breathing exercises every day ?follow rescue plan if symptoms flare-up ?keep follow-up appointments: primary care provider  ?get at least 7 to 8 hours of sleep at night ?practice relaxation or meditation daily ?check blood pressure weekly ?choose a place to take my blood pressure (home, clinic or office, retail store) ?write blood pressure results in a log or diary ?take blood pressure log to all doctor appointments ?keep all doctor appointments ?take medications for blood pressure exactly as prescribed ?report new symptoms to your doctor ?eat more whole grains, fruits and vegetables, lean meats and healthy fats ?Take miralax as prescribe ?Follow up with your doctor if you have continuing pain in left breast area ?CT scan scheduled for 09/21/21 ? ?Our next appointment is by telephone on 10/26/21 at 1045 am ? ?Please call the care guide team at [phone] if you need to cancel or reschedule your appointment.  ? ?If you are experiencing a Mental Health or [place] or need someone to talk to, please call the Suicide and Crisis Lifeline: 988 ?call the [place] National Suicide Prevention Lifeline: 9021444871 or TTY: 229-176-1818 TTY [phone]) to talk to a trained counselor ?call 1-800-273-TALK (toll free, 24 hour hotline) ?go to [facility] [address] [place] [phone]) ?call the [facility]: [phone] ?call 911  ? ?The patient verbalized understanding of instructions, educational materials, and care plan provided today and declined offer to receive copy of patient instructions, educational materials, and care plan.  ? ?Jacqlyn Larsen RNC, BSN ?RN Case Manager ?[place] ?[phone] ? ?
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
## 2021-08-26 DIAGNOSIS — I10 Essential (primary) hypertension: Secondary | ICD-10-CM

## 2021-08-26 DIAGNOSIS — J449 Chronic obstructive pulmonary disease, unspecified: Secondary | ICD-10-CM | POA: Diagnosis not present

## 2021-09-01 ENCOUNTER — Ambulatory Visit (INDEPENDENT_AMBULATORY_CARE_PROVIDER_SITE_OTHER): Payer: PPO | Admitting: Family Medicine

## 2021-09-01 VITALS — BP 140/80 | HR 87 | Temp 98.6°F | Wt 111.2 lb

## 2021-09-01 DIAGNOSIS — J441 Chronic obstructive pulmonary disease with (acute) exacerbation: Secondary | ICD-10-CM | POA: Diagnosis not present

## 2021-09-01 MED ORDER — PREDNISONE 50 MG PO TABS: ORAL_TABLET | ORAL | 0 refills | Status: DC

## 2021-09-01 MED ORDER — AMOXICILLIN-POT CLAVULANATE 875-125 MG PO TABS: 1.0000 | ORAL_TABLET | Freq: Two times a day (BID) | ORAL | 0 refills | Status: DC

## 2021-09-01 MED ORDER — ALBUTEROL SULFATE HFA 108 (90 BASE) MCG/ACT IN AERS: 2.0000 | INHALATION_SPRAY | Freq: Four times a day (QID) | RESPIRATORY_TRACT | 0 refills | Status: DC | PRN

## 2021-09-01 NOTE — Patient Instructions (Signed)
Medications as prescribed.  Call with concerns.  Take care  Dr. Melton Walls  

## 2021-09-02 DIAGNOSIS — J441 Chronic obstructive pulmonary disease with (acute) exacerbation: Secondary | ICD-10-CM | POA: Insufficient documentation

## 2021-09-02 NOTE — Assessment & Plan Note (Signed)
Patient experiencing COPD exacerbation.  Treating with prednisone as well as Augmentin.  Albuterol as needed. ?

## 2021-09-02 NOTE — Progress Notes (Signed)
? ?Subjective:  ?Patient ID: Terri Harris, female    DOB: April 17, 1941  Age: 81 y.o. MRN: [id number] ? ?CC: ?Chief Complaint  ?Patient presents with  ? Cough  ?  Patient has been sick for the past 2-3 days.  Not coughing up anything because she can't get it up  ? chest congestion  ? ? ?HPI: ? ?81 year old female with COPD presents with the above complaints. ? ?Patient reports that she has been experiencing cough and congestion for the past 3 days.  No relieving factors.  No reports of fever.  No increasing shortness of breath.  No reports of chest pain.  No other associated symptoms.  No other complaints. ? ?Patient Active Problem List  ? Diagnosis Date Noted  ? COPD exacerbation ([place]) 09/02/2021  ? Senile purpura ([place]) 08/09/2021  ? Vitamin B12 deficiency 12/09/2019  ? Lumbar stenosis 01/07/2019  ? Nocturnal headaches 01/26/2016  ? Hyperlipidemia 04/19/2015  ? Chronic lumbar pain 04/19/2015  ? HTN (hypertension) 03/27/2014  ? GERD (gastroesophageal reflux disease) 07/02/2013  ? Hx of tobacco use, presenting hazards to health 07/02/2013  ? Arthritis 07/02/2013  ? Gastritis 04/13/2013  ? Abdominal pain, unspecified site 04/13/2013  ? Osteoporosis 04/13/2013  ? Personal history of colonic polyps 04/13/2013  ? COPD (chronic obstructive pulmonary disease) ([place]) 04/13/2013  ? ? ?Social Hx   ?Social History  ? ?Socioeconomic History  ? Marital status: Married  ?  Spouse name: Not on file  ? Number of children: Not on file  ? Years of education: Not on file  ? Highest education level: Not on file  ?Occupational History  ? Not on file  ?Tobacco Use  ? Smoking status: Former  ?  Packs/day: 0.25  ?  Years: 0.00  ?  Pack years: 0.00  ?  Types: Cigarettes  ?  Quit date: 10/18/2011  ?  Years since quitting: 9.8  ? Smokeless tobacco: Never  ?Vaping Use  ? Vaping Use: Never used  ?Substance and Sexual Activity  ? Alcohol use: No  ? Drug use: No  ? Sexual activity: Not on file  ?Other Topics Concern  ? Not on file  ?Social History  Narrative  ? Not on file  ? ?Social Determinants of Health  ? ?Financial Resource Strain: Low Risk   ? Difficulty of Paying Living Expenses: Not hard at all  ?Food Insecurity: No Food Insecurity  ? Worried About Charity fundraiser in the Last Year: Never true  ? Ran Out of Food in the Last Year: Never true  ?Transportation Needs: No Transportation Needs  ? Lack of Transportation (Medical): No  ? Lack of Transportation (Non-Medical): No  ?Physical Activity: Inactive  ? Days of Exercise per Week: 0 days  ? Minutes of Exercise per Session: 0 min  ?Stress: No Stress Concern Present  ? Feeling of Stress : Not at all  ?Social Connections: Moderately Isolated  ? Frequency of Communication with Friends and Family: Three times a week  ? Frequency of Social Gatherings with Friends and Family: More than three times a week  ? Attends Religious Services: Never  ? Active Member of Clubs or Organizations: No  ? Attends Archivist Meetings: Never  ? Marital Status: Married  ? ? ?Review of Systems ?Per HPI ? ?Objective:  ?BP 140/80   Pulse 87   Temp 98.6 ?F (37 ?C) (Oral)   Wt 111 lb 3.2 oz (50.4 kg)   LMP  (LMP Unknown)   SpO2 93%  BMI 18.50 kg/m?  ? ? ?  09/01/2021  ? 11:09 AM 08/09/2021  ? 10:22 AM 07/21/2021  ?  2:30 PM  ?BP/Weight  ?Systolic BP [phone]  ?Diastolic BP 80 70 87  ?Wt. (Lbs) 111.2 115.6   ?BMI 18.5 kg/m2 19.24 kg/m2   ? ? ?Physical Exam ?Vitals and nursing note reviewed.  ?Constitutional:   ?   General: She is not in acute distress. ?   Appearance: Normal appearance. She is not ill-appearing.  ?HENT:  ?   Head: Normocephalic and atraumatic.  ?Cardiovascular:  ?   Rate and Rhythm: Normal rate and regular rhythm.  ?Pulmonary:  ?   Effort: Pulmonary effort is normal.  ?   Breath sounds: Normal breath sounds. No wheezing or rales.  ?Neurological:  ?   Mental Status: She is alert.  ?Psychiatric:     ?   Mood and Affect: Mood normal.     ?   Behavior: Behavior normal.  ? ? ?Lab Results  ?Component Value  Date  ? WBC 6.7 05/05/2021  ? HGB 13.3 05/05/2021  ? HCT 39.7 05/05/2021  ? PLT 279 05/05/2021  ? GLUCOSE 75 05/05/2021  ? CHOL 239 (H) 08/30/2020  ? TRIG 121 08/30/2020  ? HDL 74 08/30/2020  ? LDLCALC 144 (H) 08/30/2020  ? ALT 13 05/05/2021  ? AST 24 05/05/2021  ? NA 140 05/05/2021  ? K 4.3 05/05/2021  ? CL 102 05/05/2021  ? CREATININE 0.62 05/05/2021  ? BUN 10 05/05/2021  ? CO2 24 05/05/2021  ? INR 1.0 03/21/2016  ? ? ? ?Assessment & Plan:  ? ?Problem List Items Addressed This Visit   ? ?  ? Respiratory  ? COPD exacerbation ([place]) - Primary  ?  Patient experiencing COPD exacerbation.  Treating with prednisone as well as Augmentin.  Albuterol as needed. ?  ?  ? Relevant Medications  ? predniSONE (DELTASONE) 50 MG tablet  ? albuterol (VENTOLIN HFA) 108 (90 Base) MCG/ACT inhaler  ? ? ?Meds ordered this encounter  ?Medications  ? predniSONE (DELTASONE) 50 MG tablet  ?  Sig: 1 tablet daily x 5 days  ?  Dispense:  5 tablet  ?  Refill:  0  ? amoxicillin-clavulanate (AUGMENTIN) 875-125 MG tablet  ?  Sig: Take 1 tablet by mouth 2 (two) times daily.  ?  Dispense:  14 tablet  ?  Refill:  0  ? albuterol (VENTOLIN HFA) 108 (90 Base) MCG/ACT inhaler  ?  Sig: Inhale 2 puffs into the lungs every 6 (six) hours as needed for wheezing or shortness of breath.  ?  Dispense:  8 g  ?  Refill:  0  ? ?Thersa Salt DO ?[place] ? ?

## 2021-09-12 ENCOUNTER — Ambulatory Visit (INDEPENDENT_AMBULATORY_CARE_PROVIDER_SITE_OTHER): Payer: PPO | Admitting: Family Medicine

## 2021-09-12 ENCOUNTER — Ambulatory Visit (HOSPITAL_COMMUNITY)
Admission: RE | Admit: 2021-09-12 | Discharge: 2021-09-12 | Disposition: A | Discharge status: Home / Self Care | Payer: PPO | Source: Ambulatory Visit | Attending: Family Medicine | Admitting: Family Medicine

## 2021-09-12 VITALS — HR 88 | Temp 97.4°F | Wt 109.0 lb

## 2021-09-12 DIAGNOSIS — R0609 Other forms of dyspnea: Secondary | ICD-10-CM

## 2021-09-12 DIAGNOSIS — R634 Abnormal weight loss: Secondary | ICD-10-CM | POA: Diagnosis not present

## 2021-09-12 DIAGNOSIS — R059 Cough, unspecified: Secondary | ICD-10-CM | POA: Diagnosis not present

## 2021-09-12 DIAGNOSIS — R0989 Other specified symptoms and signs involving the circulatory and respiratory systems: Secondary | ICD-10-CM | POA: Diagnosis not present

## 2021-09-12 DIAGNOSIS — R079 Chest pain, unspecified: Secondary | ICD-10-CM | POA: Diagnosis not present
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
## 2021-09-13 LAB — CBC WITH DIFFERENTIAL/PLATELET
Basophils Absolute: 0.1 10*3/uL (ref 0.0–0.2)
Basos: 1 %
EOS (ABSOLUTE): 0.2 10*3/uL (ref 0.0–0.4)
Eos: 2 %
Hematocrit: 42.8 % (ref 34.0–46.6)
Hemoglobin: 14.4 g/dL (ref 11.1–15.9)
Immature Grans (Abs): 0 10*3/uL (ref 0.0–0.1)
Immature Granulocytes: 0 %
Lymphocytes Absolute: 1.8 10*3/uL (ref 0.7–3.1)
Lymphs: 18 %
MCH: 32.2 pg (ref 26.6–33.0)
MCHC: 33.6 g/dL (ref 31.5–35.7)
MCV: 96 fL (ref 79–97)
Monocytes Absolute: 0.9 10*3/uL (ref 0.1–0.9)
Monocytes: 9 %
Neutrophils Absolute: 6.7 10*3/uL (ref 1.4–7.0)
Neutrophils: 70 %
Platelets: 368 10*3/uL (ref 150–450)
RBC: 4.47 x10E6/uL (ref 3.77–5.28)
RDW: 12.7 % (ref 11.7–15.4)
WBC: 9.7 10*3/uL (ref 3.4–10.8)

## 2021-09-13 LAB — TSH: TSH: 0.831 u[IU]/mL (ref 0.450–4.500)

## 2021-09-13 LAB — D-DIMER, QUANTITATIVE: D-DIMER: 0.6 mg/L FEU — ABNORMAL HIGH (ref 0.00–0.49)

## 2021-09-14 ENCOUNTER — Other Ambulatory Visit: Payer: Self-pay

## 2021-09-14 ENCOUNTER — Encounter (HOSPITAL_COMMUNITY): Payer: Self-pay | Admitting: Radiology

## 2021-09-14 ENCOUNTER — Ambulatory Visit (HOSPITAL_COMMUNITY)
Admission: RE | Admit: 2021-09-14 | Discharge: 2021-09-14 | Disposition: A | Discharge status: Home / Self Care | Payer: PPO | Source: Ambulatory Visit | Attending: Family Medicine | Admitting: Family Medicine

## 2021-09-14 DIAGNOSIS — R0602 Shortness of breath: Secondary | ICD-10-CM | POA: Diagnosis not present

## 2021-09-14 DIAGNOSIS — R7989 Other specified abnormal findings of blood chemistry: Secondary | ICD-10-CM | POA: Diagnosis not present

## 2021-09-14 DIAGNOSIS — R0789 Other chest pain: Secondary | ICD-10-CM | POA: Diagnosis not present

## 2021-09-14 MED ORDER — IOHEXOL 350 MG/ML SOLN
100.0000 mL | Freq: Once | INTRAVENOUS | Status: AC | PRN
  Administered 2021-09-14: 75 mL via INTRAVENOUS

## 2021-09-15 LAB — POCT I-STAT CREATININE: Creatinine, Ser: 0.7 mg/dL (ref 0.44–1.00)

## 2021-09-21 ENCOUNTER — Ambulatory Visit (HOSPITAL_COMMUNITY): Payer: PPO

## 2021-09-28 ENCOUNTER — Ambulatory Visit (INDEPENDENT_AMBULATORY_CARE_PROVIDER_SITE_OTHER): Payer: PPO | Admitting: Family Medicine

## 2021-09-28 ENCOUNTER — Encounter: Payer: Self-pay | Admitting: Family Medicine

## 2021-09-28 VITALS — BP 110/57 | HR 74 | Temp 97.2°F | Ht 65.0 in

## 2021-09-28 DIAGNOSIS — E785 Hyperlipidemia, unspecified: Secondary | ICD-10-CM

## 2021-09-28 DIAGNOSIS — I700 Atherosclerosis of aorta: Secondary | ICD-10-CM | POA: Diagnosis not present

## 2021-09-28 DIAGNOSIS — I2510 Atherosclerotic heart disease of native coronary artery without angina pectoris: Secondary | ICD-10-CM

## 2021-09-28 DIAGNOSIS — J449 Chronic obstructive pulmonary disease, unspecified: Secondary | ICD-10-CM

## 2021-09-28 DIAGNOSIS — G8929 Other chronic pain: Secondary | ICD-10-CM

## 2021-09-28 DIAGNOSIS — I2584 Coronary atherosclerosis due to calcified coronary lesion: Secondary | ICD-10-CM

## 2021-09-28 HISTORY — DX: Atherosclerotic heart disease of native coronary artery without angina pectoris: I25.10

## 2021-09-28 MED ORDER — OXYCODONE-ACETAMINOPHEN 5-325 MG PO TABS: ORAL_TABLET | ORAL | 0 refills | Status: DC

## 2021-09-28 NOTE — Progress Notes (Signed)
? ?  Subjective:  ? ? Patient ID: Terri Harris, female    DOB: [birth year], 81 y.o.   MRN: [id number] ? ?Shortness of Breath ?2 week Follow up chest congestion and cough - patient states is doing better but symptoms still present  ?Doing better breathing doing better ?Had a recent scan did not show pneumonia or tumor ?Showed no blood clot ?Did show aortic atherosclerosis and coronary artery atherosclerosis and emphysema ? ?Review of Systems  ?Respiratory:  Positive for shortness of breath.   ? ?   ?Objective:  ? Physical Exam ?General-in no acute distress ?Eyes-no discharge ?Lungs-respiratory rate normal, CTA ?CV-no murmurs,RRR ?Extremities skin warm dry no edema ?Neuro grossly normal ?Behavior normal, alert ? ?Patient has ongoing back pain hip pain knee pain cannot be on anti-inflammatories.  Tylenol does not help enough.  Has been on low-dose pain medicine for years.  Does benefit her.  She tries to keep it minimal at 3 to 4/day ? ? ? ?   ?Assessment & Plan:  ?1. Hyperlipidemia, unspecified hyperlipidemia type ?Cholesterol labs checked ordered will need to be on a statin but we will see what the baseline shows first ?- Lipid panel ? ?2. Chronic obstructive pulmonary disease, unspecified COPD type ([place]) ?Patient not smoking currently and stay away from secondhand smoke ? ?3. Aortic atherosclerosis ([place]) ?Check cholesterol start statin upon result cholesterol ? ?4. Coronary artery calcification ?Same as per above ? ?5. Encounter for chronic pain management ?2 additional prescriptions given patient compliant with medicine we will do comprehensive pain visit in 2 and half months with UDT ? ? ?

## 2021-09-30 DIAGNOSIS — E785 Hyperlipidemia, unspecified: Secondary | ICD-10-CM | POA: Diagnosis not present

## 2021-10-01 LAB — LIPID PANEL
Chol/HDL Ratio: 3 ratio (ref 0.0–4.4)
Cholesterol, Total: 176 mg/dL (ref 100–199)
HDL: 58 mg/dL (ref >39)
LDL Chol Calc (NIH): 91 mg/dL (ref 0–99)
Triglycerides: 156 mg/dL — ABNORMAL HIGH (ref 0–149)
VLDL Cholesterol Cal: 27 mg/dL (ref 5–40)

## 2021-10-04 ENCOUNTER — Other Ambulatory Visit: Payer: Self-pay

## 2021-10-04 DIAGNOSIS — I700 Atherosclerosis of aorta: Secondary | ICD-10-CM

## 2021-10-04 DIAGNOSIS — E785 Hyperlipidemia, unspecified: Secondary | ICD-10-CM

## 2021-10-04 MED ORDER — ROSUVASTATIN CALCIUM 10 MG PO TABS: 10.0000 mg | ORAL_TABLET | Freq: Every day | ORAL | 5 refills | Status: DC

## 2021-10-26 ENCOUNTER — Ambulatory Visit (INDEPENDENT_AMBULATORY_CARE_PROVIDER_SITE_OTHER): Payer: PPO | Admitting: *Deleted

## 2021-10-26 DIAGNOSIS — Z87891 Personal history of nicotine dependence: Secondary | ICD-10-CM

## 2021-10-26 DIAGNOSIS — J449 Chronic obstructive pulmonary disease, unspecified: Secondary | ICD-10-CM | POA: Diagnosis not present

## 2021-10-26 DIAGNOSIS — I10 Essential (primary) hypertension: Secondary | ICD-10-CM | POA: Diagnosis not present
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
## 2021-10-26 NOTE — Patient Instructions (Signed)
Visit Information  Thank you for taking time to visit with me today. Please don't hesitate to contact me if I can be of assistance to you before our next scheduled telephone appointment.  Following are the goals we discussed today:  Take medications as prescribed   Attend all scheduled provider appointments Perform all self care activities independently  Perform IADL's (shopping, preparing meals, housekeeping, managing finances) independently Call provider office for new concerns or questions  listen for public air quality announcements every day do breathing exercises every day eliminate symptom triggers at home follow rescue plan if symptoms flare-up get at least 7 to 8 hours of sleep at night do breathing exercises every day check blood pressure weekly choose a place to take my blood pressure (home, clinic or office, retail store) write blood pressure results in a log or diary take blood pressure log to all doctor appointments keep all doctor appointments take medications for blood pressure exactly as prescribed report new symptoms to your doctor Try to get outside daily and walk Take miralax as prescribed Eat small, frequent meals and snacks, continue drinking Ensure  Our next appointment is by telephone on 01/04/22 at 130 pm  Please call the care guide team at [phone] if you need to cancel or reschedule your appointment.   If you are experiencing a Mental Health or [place] or need someone to talk to, please call the Suicide and Crisis Lifeline: 988 call the [place] National Suicide Prevention Lifeline: (847)757-2714 or TTY: 514-833-8557 TTY 304-353-3759) to talk to a trained counselor call 1-800-273-TALK (toll free, 24 hour hotline) go to [facility] [address], [place] [phone]) call the [place]: [phone] call 911   The patient verbalized understanding of instructions, educational  materials, and care plan provided today and DECLINED offer to receive copy of patient instructions, educational materials, and care plan.   Jacqlyn Larsen [facility], BSN RN Case Manager [facility] [phone]

## 2021-11-15 ENCOUNTER — Ambulatory Visit: Payer: PPO | Admitting: Family Medicine

## 2021-12-06 ENCOUNTER — Ambulatory Visit (INDEPENDENT_AMBULATORY_CARE_PROVIDER_SITE_OTHER): Payer: PPO

## 2021-12-06 VITALS — Ht 65.0 in | Wt 114.0 lb

## 2021-12-06 DIAGNOSIS — Z0000 Encounter for general adult medical examination without abnormal findings: Secondary | ICD-10-CM | POA: Diagnosis not present

## 2021-12-06 NOTE — Progress Notes (Signed)
Subjective:   Terri Harris is a 81 y.o. female who presents for Medicare Annual (Subsequent) preventive examination. Virtual Visit via Telephone Note  I connected with  Terri Harris on 12/06/21 at 10:45 AM EDT by telephone and verified that I am speaking with the correct person using two identifiers.  Location: Patient: HOME Provider: [facility] Persons participating in the virtual visit: patient/Nurse Health Advisor   I discussed the limitations, risks, security and privacy concerns of performing an evaluation and management service by telephone and the availability of in person appointments. The patient expressed understanding and agreed to proceed.  Interactive audio and video telecommunications were attempted between this nurse and patient, however failed, due to patient having technical difficulties OR patient did not have access to video capability.  We continued and completed visit with audio only.  Some vital signs may be absent or patient reported.   Chriss Driver, LPN  Review of Systems     Cardiac Risk Factors include: advanced age (>4mn, >>37women);hypertension;dyslipidemia;sedentary lifestyle;Other (see comment), Risk factor comments: COPD     Objective:    Today's Vitals   12/06/21 1038 12/06/21 1040  Weight: 114 lb (51.7 kg)   Height: '5\' 5"'$  (1.651 m)   PainSc:  8    Body mass index is 18.97 kg/m.     12/06/2021   10:46 AM 07/21/2021   10:55 AM 04/27/2021   11:13 PM 04/20/2021   11:37 AM 03/28/2021    1:21 PM 11/23/2020   10:18 AM 01/03/2019   12:50 PM  Advanced Directives  Does Patient Have a Medical Advance Directive? No No No No No No No  Would patient like information on creating a medical advance directive? No - Patient declined  No - Patient declined No - Guardian declined No - Patient declined No - Patient declined No - Patient declined    Current Medications (verified) Outpatient Encounter Medications as of 12/06/2021  Medication Sig    acetaminophen (TYLENOL) 325 MG tablet Take 2 tablets (650 mg total) by mouth every 6 (six) hours as needed for mild pain (or Fever >/= 101).   albuterol (VENTOLIN HFA) 108 (90 Base) MCG/ACT inhaler Inhale 2 puffs into the lungs every 6 (six) hours as needed for wheezing or shortness of breath.   alum & mag hydroxide-simeth (MAALOX/MYLANTA) 200-200-20 MG/5ML suspension Take 15 mLs by mouth every 6 (six) hours as needed for indigestion or heartburn.   amLODipine (NORVASC) 5 MG tablet TAKE 1 TABLET BY MOUTH EVERY DAY   cyanocobalamin 1000 MCG tablet Take 1,000 mcg by mouth daily.   oxyCODONE-acetaminophen (PERCOCET/ROXICET) 5-325 MG tablet 1 taken 3-4 times per day to last 30 days   oxyCODONE-acetaminophen (PERCOCET/ROXICET) 5-325 MG tablet 1 pill taken 4 times per day as needed for pain to last 30 days   oxyCODONE-acetaminophen (PERCOCET/ROXICET) 5-325 MG tablet 1 pill taken 3 or 4 times daily as needed for pain to last 30 days   pantoprazole (PROTONIX) 40 MG tablet Take 1 tablet (40 mg total) by mouth 2 (two) times daily before a meal.   polyethylene glycol (MIRALAX / GLYCOLAX) 17 g packet Take 17 g by mouth daily.   rosuvastatin (CRESTOR) 10 MG tablet Take 1 tablet (10 mg total) by mouth daily.   No facility-administered encounter medications on file as of 12/06/2021.    Allergies (verified) Codeine and Fosamax [alendronate sodium]   History: Past Medical History:  Diagnosis Date   ANA positive 01/2001   Arthritis  Arthritis 07/02/2013   ANA positive   Cancer ([place])    partial hysterectomy age 35-uterine cancer   Coronary artery calcification 09/28/2021   GERD (gastroesophageal reflux disease)    Hx of tobacco use, presenting hazards to health 07/02/2013   Hypercholesteremia    Hypertension    Ileus, postoperative ([place]) 07/02/2013   Osteoporosis    Pneumonia    hx of   SBO (small bowel obstruction) ([place]) 06/20/2103   Past Surgical History:  Procedure Laterality Date   ABDOMINAL  HYSTERECTOMY     ANTERIOR LAT LUMBAR FUSION N/A 06/08/2014   Procedure: LUMBAR 2-3 LUMBAR 3-4 LUMBAR 4-5 ANTEROLATERAL DECOMPRESSION/FUSION WITH PERCUTANEOUS PEDICLE SCREWS;  Surgeon: Kristeen Miss, MD;  [facility]: [facility];  Service: Neurosurgery;  Laterality: N/A;  L2-3 L3-4 L4-5 ANTEROLATERAL DECOMPRESSION/FUSION WITH PERCUTANEOUS PEDICLE SCREWS   ANTERIOR LAT LUMBAR FUSION Left 01/07/2019   Procedure: Lumbar one-two Anterolateral lumbar interbody fusion with lateral plate fixation;  Surgeon: Kristeen Miss, MD;  Location: [facility];  Service: Neurosurgery;  Laterality: Left;   BACK SURGERY     BOWEL RESECTION N/A 06/20/2013   Procedure: SMALL BOWEL RESECTION;  Surgeon: Harl Bowie, MD;  Location: [facility];  Service: General;  Laterality: N/A;   COLON SURGERY     COLONOSCOPY  10/25/2011   Procedure: COLONOSCOPY;  Surgeon: Rogene Houston, MD;  [facility];  Service: Endoscopy;  Laterality: N/A;  930   COLONOSCOPY N/A 05/09/2017   Procedure: COLONOSCOPY;  Surgeon: Rogene Houston, MD;  [facility];  Service: Endoscopy;  Laterality: N/A;  1030   ESOPHAGEAL DILATION N/A 05/25/2015   Procedure: ESOPHAGEAL DILATION;  Surgeon: Rogene Houston, MD;  [facility];  Service: Endoscopy;  Laterality: N/A;   ESOPHAGOGASTRODUODENOSCOPY  02/2009   ESOPHAGOGASTRODUODENOSCOPY N/A 05/25/2015   Procedure: ESOPHAGOGASTRODUODENOSCOPY (EGD);  Surgeon: Rogene Houston, MD;  [facility];  Service: Endoscopy;  Laterality: N/A;   HERNIA REPAIR Left    groin   INGUINAL HERNIA REPAIR Left 09/11/2013   Procedure: HERNIA REPAIR INGUINAL INCARCERATED;  Surgeon: Harl Bowie, MD;  Location: [facility];  Service: General;  Laterality: Left;   LAPAROTOMY N/A 06/20/2013   Procedure: EXPLORATORY LAPAROTOMY;  Surgeon: Harl Bowie, MD;  Location: [facility];  Service: General;  Laterality: N/A;   LUMBAR PERCUTANEOUS PEDICLE SCREW 3 LEVEL N/A 06/08/2014   Procedure: LUMBAR  PERCUTANEOUS PEDICLE SCREW 3 LEVEL;  Surgeon: Kristeen Miss, MD;  [facility];  Service: Neurosurgery;  Laterality: N/A;   POLYPECTOMY  05/09/2017   Procedure: POLYPECTOMY;  Surgeon: Rogene Houston, MD;  [facility];  Service: Endoscopy;;  sigmoid colon   TONSILLECTOMY     Family History  Problem Relation Age of Onset   Hypertension Mother    Heart disease Father    Diabetes Sister    Heart disease Sister    Diabetes Brother    Heart disease Brother    Anesthesia problems Neg Hx    Hypotension Neg Hx    Malignant hyperthermia Neg Hx    Pseudochol deficiency Neg Hx    Social History   Socioeconomic History   Marital status: Married    Spouse name: Not on file   Number of children: Not on file   Years of education: Not on file   Highest education level: Not on file  Occupational History   Not on file  Tobacco Use   Smoking status: Former    Packs/day:  0.25    Years: 0.00    Total pack years: 0.00    Types: Cigarettes    Quit date: 10/18/2011    Years since quitting: 10.1   Smokeless tobacco: Never  Vaping Use   Vaping Use: Never used  Substance and Sexual Activity   Alcohol use: No   Drug use: No   Sexual activity: Not on file  Other Topics Concern   Not on file  Social History Narrative   Not on file   Social Determinants of Health   Financial Resource Strain: Low Risk  (12/06/2021)   Overall Financial Resource Strain (CARDIA)    Difficulty of Paying Living Expenses: Not hard at all  Food Insecurity: No Food Insecurity (12/06/2021)   Hunger Vital Sign    Worried About Running Out of Food in the Last Year: Never true    Ran Out of Food in the Last Year: Never true  Transportation Needs: No Transportation Needs (12/06/2021)   PRAPARE - Hydrologist (Medical): No    Lack of Transportation (Non-Medical): No  Physical Activity: Sufficiently Active (12/06/2021)   Exercise Vital Sign    Days of Exercise per Week: 5  days    Minutes of Exercise per Session: 30 min  Stress: No Stress Concern Present (12/06/2021)   [place]    Feeling of Stress : Not at all  Social Connections: [place] (12/06/2021)   Social Connection and Isolation Panel [NHANES]    Frequency of Communication with Friends and Family: More than three times a week    Frequency of Social Gatherings with Friends and Family: More than three times a week    Attends Religious Services: More than 4 times per year    Active Member of [employer] or Organizations: Yes    Attends Music therapist: More than 4 times per year    Marital Status: Married    Tobacco Counseling Counseling given: Not Answered   Clinical Intake:  Pre-visit preparation completed: Yes  Pain : 0-10 Pain Score: 8  Pain Type: Chronic pain Pain Location: Back Pain Descriptors / Indicators: Aching, Discomfort Pain Onset: More than a month ago Pain Frequency: Intermittent     BMI - recorded: 18.97 Nutritional Status: BMI <19  Underweight Nutritional Risks: None Diabetes: No  How often do you need to have someone help you when you read instructions, pamphlets, or other written materials from your doctor or pharmacy?: 1 - Never  Diabetic?no  Interpreter Needed?: No  Information entered by :: mj Chaynce Schafer, lpn   Activities of Daily Living    12/06/2021   10:47 AM 04/28/2021    8:09 AM  In your present state of health, do you have any difficulty performing the following activities:  Hearing? 0 1  Vision? 0 0  Difficulty concentrating or making decisions? 0 0  Walking or climbing stairs? 0 0  Dressing or bathing? 0 0  Doing errands, shopping? 0 0  Preparing Food and eating ? N   Using the Toilet? N   In the past six months, have you accidently leaked urine? N   Do you have problems with loss of bowel control? N   Managing your Medications? N   Managing your Finances? N    Housekeeping or managing your Housekeeping? N     Patient Care Team: Kathyrn Drown, MD as PCP - General (Family Medicine) Kassie Mends, RN as Case Manager  Indicate any recent Medical Services you may have received from other than [facility] providers in the past year (date may be approximate).     Assessment:   This is a routine wellness examination for [place].  Hearing/Vision screen Hearing Screening - Comments:: Some hearing issues.  Vision Screening - Comments:: No glasses. [employer]. 2022.  Dietary issues and exercise activities discussed: Current Exercise Habits: Home exercise routine, Type of exercise: walking, Time (Minutes): 30, Frequency (Times/Week): 5, Weekly Exercise (Minutes/Week): 150, Intensity: Mild, Exercise limited by: cardiac condition(s)   Goals Addressed             This Visit's Progress    Patient Stated   On track    I would like to live long enough to see my granddaughter get married and have children       Depression Screen    12/06/2021   10:41 AM 09/28/2021    3:18 PM 05/11/2021    9:05 AM 04/20/2021   11:58 AM 04/20/2021   11:36 AM 12/06/2020   10:08 AM 11/23/2020   10:20 AM  PHQ 2/9 Scores  PHQ - 2 Score 0 0 0 0 0 0 0    Fall Risk    12/06/2021   10:46 AM 09/28/2021    3:18 PM 05/11/2021    9:05 AM 04/20/2021   11:36 AM 12/06/2020   10:08 AM  [place] in the past year? 0 0 0 0 0  Number falls in past yr: 0 0 0    Injury with Fall? 0 0 0    Risk for fall due to : No Fall Risks No Fall Risks No Fall Risks    Follow up Falls prevention discussed Falls evaluation completed Falls evaluation completed  Falls evaluation completed    [place]:  Any stairs in or around the home? Yes  If so, are there any without handrails? No  Home free of loose throw rugs in walkways, pet beds, electrical cords, etc? Yes  Adequate lighting in your home to reduce risk of falls? Yes   ASSISTIVE  DEVICES UTILIZED TO PREVENT FALLS:  Life alert? No  Use of a cane, walker or w/c? No  Grab bars in the bathroom? Yes  Shower chair or bench in shower? Yes  Elevated toilet seat or a handicapped toilet? Yes   TIMED UP AND GO:  Was the test performed? No .  Phone visit.  Cognitive Function:        12/06/2021   10:48 AM  6CIT Screen  What Year? 0 points  What month? 0 points  What time? 0 points  Count back from 20 0 points  Months in reverse 0 points  Repeat phrase 0 points  Total Score 0 points    Immunizations Immunization History  Administered Date(s) Administered   Fluad Quad(high Dose 65+) 03/18/2020, 04/02/2021   Influenza Split 04/02/2013   Influenza,inj,Quad PF,6+ Mos 03/27/2014, 04/19/2015, 03/21/2016, 04/09/2017, 04/10/2018, 03/01/2019   Moderna Sars-Covid-2 Vaccination 07/10/2019, 08/08/2019, 05/26/2020   Pneumococcal Conjugate-13 03/27/2014   Pneumococcal Polysaccharide-23 06/21/2013   Pneumococcal-Unspecified 02/28/2005   Td 02/25/2006   Unspecified SARS-COV-2 Vaccination 07/10/2019, 08/08/2019   Zoster Recombinat (Shingrix) 04/14/2018, 07/18/2018, 11/04/2018, 11/16/2018    TDAP status: Due, Education has been provided regarding the importance of this vaccine. Advised may receive this vaccine at local pharmacy or Health Dept. Aware to provide a copy of the vaccination record if obtained from local pharmacy or Health Dept.  Verbalized acceptance and understanding.  Flu Vaccine status: Up to date  Pneumococcal vaccine status: Up to date  Covid-19 vaccine status: Completed vaccines  Qualifies for Shingles Vaccine? Yes   Zostavax completed Yes   Shingrix Completed?: Yes  Screening Tests Health Maintenance  Topic Date Due   TETANUS/TDAP  03/28/2022 (Originally 02/26/2016)   INFLUENZA VACCINE  12/27/2021   COLONOSCOPY (Pts 45-54yr Insurance coverage will need to be confirmed)  05/09/2022   Pneumonia Vaccine 80 Years old  Completed   DEXA SCAN   Completed   Zoster Vaccines- Shingrix  Completed   HPV VACCINES  Aged Out   COVID-19 Vaccine  Discontinued    Health Maintenance  There are no preventive care reminders to display for this patient.   Colorectal cancer screening: Type of screening: Colonoscopy. Completed 05/09/2017. Repeat every 5 years  Mammogram status: Completed 09/18/2019. Repeat every year  Bone Density status: Completed 09/18/2019. Results reflect: Bone density results: OSTEOPOROSIS. Repeat every 2 years.  Lung Cancer Screening: (Low Dose CT Chest recommended if Age 80-80years, 30 pack-year currently smoking OR have quit w/in 15years.) does not qualify.   Lung Cancer Screening Referral: CT ordered 08/09/2021 by Dr. LWolfgang Phoenix  Additional Screening:  Hepatitis C Screening: does not qualify;  Vision Screening: Recommended annual ophthalmology exams for early detection of glaucoma and other disorders of the eye. Is the patient up to date with their annual eye exam?  Yes  Who is the provider or what is the name of the office in which the patient attends annual eye exams? [facility] If pt is not established with a provider, would they like to be referred to a provider to establish care? No .   Dental Screening: Recommended annual dental exams for proper oral hygiene  Community Resource Referral / Chronic Care Management: CRR required this visit?  No   CCM required this visit?  No      Plan:     I have personally reviewed and noted the following in the patient's chart:   Medical and social history Use of alcohol, tobacco or illicit drugs  Current medications and supplements including opioid prescriptions.  Functional ability and status Nutritional status Physical activity Advanced directives List of other physicians Hospitalizations, surgeries, and ER visits in previous 12 months Vitals Screenings to include cognitive, depression, and falls Referrals and appointments  In addition, I  have reviewed and discussed with patient certain preventive protocols, quality metrics, and best practice recommendations. A written personalized care plan for preventive services as well as general preventive health recommendations were provided to patient.     MChriss Driver LPN   [id number]  Nurse Notes: Pt is up to date on all vaccines.

## 2021-12-06 NOTE — Patient Instructions (Signed)
Terri Harris , Thank you for taking time to come for your Medicare Wellness Visit. I appreciate your ongoing commitment to your health goals. Please review the following plan we discussed and let me know if I can assist you in the future.   Screening recommendations/referrals: Colonoscopy: Done 05/09/2017 Repeat in 5 years  Mammogram: Done 09/18/2019. Repeat not required. Bone Density: Done 09/18/2019. Repeat not required.  Recommended yearly ophthalmology/optometry visit for glaucoma screening and checkup Recommended yearly dental visit for hygiene and checkup  Vaccinations: Influenza vaccine: Done 04/02/2021 Repeat annually  Pneumococcal vaccine: Done 03/27/2014 and 06/21/2013. Tdap vaccine: Done 02/25/2006 Repeat in 10 years  Shingles vaccine: Done 11/16/2018, 07/18/2018 and 04/14/2018.   Covid-19:Done 05/26/2020, 08/08/2019 and 07/10/2019.  Advanced directives: Advance directive discussed with you today. Even though you declined this today, please call our office should you change your mind, and we can give you the proper paperwork for you to fill out.   Conditions/risks identified: KEEP UP THE GOOD WORK!!  Next appointment: Follow up in one year for your annual wellness visit 2024.   Preventive Care 80 Years and Older, Female Preventive care refers to lifestyle choices and visits with your health care provider that can promote health and wellness. What does preventive care include? A yearly physical exam. This is also called an annual well check. Dental exams once or twice a year. Routine eye exams. Ask your health care provider how often you should have your eyes checked. Personal lifestyle choices, including: Daily care of your teeth and gums. Regular physical activity. Eating a healthy diet. Avoiding tobacco and drug use. Limiting alcohol use. Practicing safe sex. Taking low-dose aspirin every day. Taking vitamin and mineral supplements as recommended by your health care  provider. What happens during an annual well check? The services and screenings done by your health care provider during your annual well check will depend on your age, overall health, lifestyle risk factors, and family history of disease. Counseling  Your health care provider may ask you questions about your: Alcohol use. Tobacco use. Drug use. Emotional well-being. Home and relationship well-being. Sexual activity. Eating habits. History of falls. Memory and ability to understand (cognition). Work and work Statistician. Reproductive health. Screening  You may have the following tests or measurements: Height, weight, and BMI. Blood pressure. Lipid and cholesterol levels. These may be checked every 5 years, or more frequently if you are over 54 years old. Skin check. Lung cancer screening. You may have this screening every year starting at age 76 if you have a 30-pack-year history of smoking and currently smoke or have quit within the past 15 years. Fecal occult blood test (FOBT) of the stool. You may have this test every year starting at age 25. Flexible sigmoidoscopy or colonoscopy. You may have a sigmoidoscopy every 5 years or a colonoscopy every 10 years starting at age 26. Hepatitis C blood test. Hepatitis B blood test. Sexually transmitted disease (STD) testing. Diabetes screening. This is done by checking your blood sugar (glucose) after you have not eaten for a while (fasting). You may have this done every 1-3 years. Bone density scan. This is done to screen for osteoporosis. You may have this done starting at age 60. Mammogram. This may be done every 1-2 years. Talk to your health care provider about how often you should have regular mammograms. Talk with your health care provider about your test results, treatment options, and if necessary, the need for more tests. Vaccines  Your health care provider  may recommend certain vaccines, such as: Influenza vaccine. This is  recommended every year. Tetanus, diphtheria, and acellular pertussis (Tdap, Td) vaccine. You may need a Td booster every 10 years. Zoster vaccine. You may need this after age 89. Pneumococcal 13-valent conjugate (PCV13) vaccine. One dose is recommended after age 52. Pneumococcal polysaccharide (PPSV23) vaccine. One dose is recommended after age 67. Talk to your health care provider about which screenings and vaccines you need and how often you need them. This information is not intended to replace advice given to you by your health care provider. Make sure you discuss any questions you have with your health care provider. Document Released: 06/11/2015 Document Revised: 02/02/2016 Document Reviewed: 03/16/2015 Elsevier Interactive Patient Education  2017 [place] Prevention in the Home Falls can cause injuries. They can happen to people of all ages. There are many things you can do to make your home safe and to help prevent falls. What can I do on the outside of my home? Regularly fix the edges of walkways and driveways and fix any cracks. Remove anything that might make you trip as you walk through a door, such as a raised step or threshold. Trim any bushes or trees on the path to your home. Use bright outdoor lighting. Clear any walking paths of anything that might make someone trip, such as rocks or tools. Regularly check to see if handrails are loose or broken. Make sure that both sides of any steps have handrails. Any raised decks and porches should have guardrails on the edges. Have any leaves, snow, or ice cleared regularly. Use sand or salt on walking paths during winter. Clean up any spills in your garage right away. This includes oil or grease spills. What can I do in the bathroom? Use night lights. Install grab bars by the toilet and in the tub and shower. Do not use towel bars as grab bars. Use non-skid mats or decals in the tub or shower. If you need to sit down in  the shower, use a plastic, non-slip stool. Keep the floor dry. Clean up any water that spills on the floor as soon as it happens. Remove soap buildup in the tub or shower regularly. Attach bath mats securely with double-sided non-slip rug tape. Do not have throw rugs and other things on the floor that can make you trip. What can I do in the bedroom? Use night lights. Make sure that you have a light by your bed that is easy to reach. Do not use any sheets or blankets that are too big for your bed. They should not hang down onto the floor. Have a firm chair that has side arms. You can use this for support while you get dressed. Do not have throw rugs and other things on the floor that can make you trip. What can I do in the kitchen? Clean up any spills right away. Avoid walking on wet floors. Keep items that you use a lot in easy-to-reach places. If you need to reach something above you, use a strong step stool that has a grab bar. Keep electrical cords out of the way. Do not use floor polish or wax that makes floors slippery. If you must use wax, use non-skid floor wax. Do not have throw rugs and other things on the floor that can make you trip. What can I do with my stairs? Do not leave any items on the stairs. Make sure that there are handrails on both sides  of the stairs and use them. Fix handrails that are broken or loose. Make sure that handrails are as long as the stairways. Check any carpeting to make sure that it is firmly attached to the stairs. Fix any carpet that is loose or worn. Avoid having throw rugs at the top or bottom of the stairs. If you do have throw rugs, attach them to the floor with carpet tape. Make sure that you have a light switch at the top of the stairs and the bottom of the stairs. If you do not have them, ask someone to add them for you. What else can I do to help prevent falls? Wear shoes that: Do not have high heels. Have rubber bottoms. Are comfortable  and fit you well. Are closed at the toe. Do not wear sandals. If you use a stepladder: Make sure that it is fully opened. Do not climb a closed stepladder. Make sure that both sides of the stepladder are locked into place. Ask someone to hold it for you, if possible. Clearly mark and make sure that you can see: Any grab bars or handrails. First and last steps. Where the edge of each step is. Use tools that help you move around (mobility aids) if they are needed. These include: Canes. Walkers. Scooters. Crutches. Turn on the lights when you go into a dark area. Replace any light bulbs as soon as they burn out. Set up your furniture so you have a clear path. Avoid moving your furniture around. If any of your floors are uneven, fix them. If there are any pets around you, be aware of where they are. Review your medicines with your doctor. Some medicines can make you feel dizzy. This can increase your chance of falling. Ask your doctor what other things that you can do to help prevent falls. This information is not intended to replace advice given to you by your health care provider. Make sure you discuss any questions you have with your health care provider. Document Released: 03/11/2009 Document Revised: 10/21/2015 Document Reviewed: 06/19/2014 Elsevier Interactive Patient Education  2017 [employer].

## 2021-12-12 DIAGNOSIS — I700 Atherosclerosis of aorta: Secondary | ICD-10-CM | POA: Diagnosis not present

## 2021-12-12 DIAGNOSIS — E785 Hyperlipidemia, unspecified: Secondary | ICD-10-CM | POA: Diagnosis not present

## 2021-12-13 LAB — LIPID PANEL
Chol/HDL Ratio: 2.6 ratio (ref 0.0–4.4)
Cholesterol, Total: 164 mg/dL (ref 100–199)
HDL: 63 mg/dL (ref >39)
LDL Chol Calc (NIH): 82 mg/dL (ref 0–99)
Triglycerides: 104 mg/dL (ref 0–149)
VLDL Cholesterol Cal: 19 mg/dL (ref 5–40)

## 2021-12-13 LAB — HEPATIC FUNCTION PANEL
ALT: 20 IU/L (ref 0–32)
AST: 36 IU/L (ref 0–40)
Albumin: 5 g/dL — ABNORMAL HIGH (ref 3.8–4.8)
Alkaline Phosphatase: 98 IU/L (ref 44–121)
Bilirubin Total: 0.4 mg/dL (ref 0.0–1.2)
Bilirubin, Direct: 0.12 mg/dL (ref 0.00–0.40)
Total Protein: 8.1 g/dL (ref 6.0–8.5)

## 2021-12-19 ENCOUNTER — Ambulatory Visit: Payer: Self-pay | Admitting: Family Medicine

## 2021-12-29 ENCOUNTER — Ambulatory Visit (INDEPENDENT_AMBULATORY_CARE_PROVIDER_SITE_OTHER): Payer: PPO | Admitting: Family Medicine

## 2021-12-29 ENCOUNTER — Encounter: Payer: Self-pay | Admitting: Family Medicine

## 2021-12-29 VITALS — BP 127/67 | Wt 116.2 lb

## 2021-12-29 DIAGNOSIS — G8929 Other chronic pain: Secondary | ICD-10-CM

## 2021-12-29 MED ORDER — ROSUVASTATIN CALCIUM 10 MG PO TABS: 10.0000 mg | ORAL_TABLET | Freq: Every day | ORAL | 5 refills | Status: DC

## 2021-12-29 MED ORDER — PANTOPRAZOLE SODIUM 40 MG PO TBEC: 40.0000 mg | DELAYED_RELEASE_TABLET | Freq: Two times a day (BID) | ORAL | 3 refills | Status: DC

## 2021-12-29 MED ORDER — AMLODIPINE BESYLATE 5 MG PO TABS: 5.0000 mg | ORAL_TABLET | Freq: Every day | ORAL | 1 refills | Status: DC

## 2021-12-29 MED ORDER — OXYCODONE-ACETAMINOPHEN 5-325 MG PO TABS: ORAL_TABLET | ORAL | 0 refills | Status: DC

## 2021-12-29 NOTE — Progress Notes (Signed)
Subjective:    Patient ID: Terri Harris, female    DOB: 21-Nov-1940, 81 y.o.   MRN: [id number]  HPI This patient was seen today for chronic pain  The medication list was reviewed and updated.  Location of Pain for which the patient has been treated with regarding narcotics:   Onset of this pain:    -Compliance with medication: Oxycodone 5-325 mg  - Number patient states they take daily: 3-4  -when was the last dose patient took? This morning about 2:30  The patient was advised the importance of maintaining medication and not using illegal substances with these.  Here for refills and follow up  The patient was educated that we can provide 3 monthly scripts for their medication, it is their responsibility to follow the instructions.  Side effects or complications from medications: none  Patient is aware that pain medications are meant to minimize the severity of the pain to allow their pain levels to improve to allow for better function. They are aware of that pain medications cannot totally remove their pain.  Due for UDT ( at least once per year) : completed today  Scale of 1 to 10 ( 1 is least 10 is most) Your pain level without the medicine: 10 Your pain level with medication: 7  Scale 1 to 10 ( 1-helps very little, 10 helps very well) How well does your pain medication reduce your pain so you can function better through out the day? Does not take during the day; takes them at night  Quality of the pain: Aching throbbing mainly in the neck mid back low back as well as hips and knees  Persistence of the pain: Present all the time worse with activity  Modifying factors: Worse with activity She states because of the pain she is having to utilize 4/day more often and would like to go up on the dosing to 4/day I believe this is reasonable       Review of Systems     Objective:   Physical Exam General-in no acute distress Eyes-no discharge Lungs-respiratory rate  normal, CTA CV-no murmurs,RRR Extremities skin warm dry no edema Neuro grossly normal Behavior normal, alert Subjective discomfort in the thoracic and lumbar spine patient also has significant discomfort in her joints as well as her hands       Assessment & Plan:   1. Encounter for chronic pain management The patient was seen in followup for chronic pain. A review over at their current pain status was discussed. Drug registry was checked. Prescriptions were given.  Regular follow-up recommended. Discussion was held regarding the importance of compliance with medication as well as pain medication contract.  Patient was informed that medication may cause drowsiness and should not be combined  with other medications/alcohol or street drugs. If the patient feels medication is causing altered alertness then do not drive or operate dangerous equipment.  Should be noted that the patient appears to be meeting appropriate use of opioids and response.  Evidenced by improved function and decent pain control without significant side effects and no evidence of overt aberrancy issues.  Upon discussion with the patient today they understand that opioid therapy is optional and they feel that the pain has been refractory to reasonable conservative measures and is significant and affecting quality of life enough to warrant ongoing therapy and wishes to continue opioids.  Refills were provided. We will go ahead and go up to 120 tablets/month patient will follow-up within 3  months  Lipid profile looks good continue current medications - ToxASSURE Select 13 (MW), Urine

## 2022-01-03 ENCOUNTER — Encounter: Payer: Self-pay | Admitting: Family Medicine

## 2022-01-03 LAB — TOXASSURE SELECT 13 (MW), URINE

## 2022-01-04 ENCOUNTER — Ambulatory Visit (INDEPENDENT_AMBULATORY_CARE_PROVIDER_SITE_OTHER): Payer: PPO | Admitting: *Deleted

## 2022-01-04 DIAGNOSIS — J449 Chronic obstructive pulmonary disease, unspecified: Secondary | ICD-10-CM

## 2022-01-04 DIAGNOSIS — I10 Essential (primary) hypertension: Secondary | ICD-10-CM

## 2022-01-04 NOTE — Patient Instructions (Signed)
Visit Information  Thank you for taking time to visit with me today. Please don't hesitate to contact me if I can be of assistance to you before our next scheduled telephone appointment.  Following are the goals we discussed today:  Take medications as prescribed   Attend all scheduled provider appointments Perform all self care activities independently  Perform IADL's (shopping, preparing meals, housekeeping, managing finances) independently Call provider office for new concerns or questions  listen for public air quality announcements every day do breathing exercises every day eliminate symptom triggers at home follow rescue plan if symptoms flare-up get at least 7 to 8 hours of sleep at night practice relaxation or meditation daily do breathing exercises every day check blood pressure weekly choose a place to take my blood pressure (home, clinic or office, retail store) write blood pressure results in a log or diary take blood pressure log to all doctor appointments keep all doctor appointments take medications for blood pressure exactly as prescribed report new symptoms to your doctor eat more whole grains, fruits and vegetables, lean meats and healthy fats Try to get outside daily and walk Take miralax as prescribed Take pain medications as prescribed, practice relaxation Eat small, frequent meals and snacks, continue drinking Ensure Case closure today, please let your doctor know if you have any future care management needs  Please call the care guide team at [phone] if you need to cancel or reschedule your appointment.   If you are experiencing a Mental Health or [place] or need someone to talk to, please call the Suicide and Crisis Lifeline: 988 call the [place] National Suicide Prevention Lifeline: (340)784-8170 or TTY: 531-486-1953 TTY (769) 632-5790) to talk to a trained counselor call 1-800-273-TALK (toll free, 24 hour hotline) go to [facility] [address] [place] [phone]) call the [place]: (646)[phone] call 911   The patient verbalized understanding of instructions, educational materials, and care plan provided today and DECLINED offer to receive copy of patient instructions, educational materials, and care plan.   Jacqlyn Larsen RNC, BSN RN Case Manager [facility] [phone]   Chronic Back Pain When back pain lasts longer than 3 months, it is called chronic back pain. Pain may get worse at certain times (flare-ups). There are things you can do at home to manage your pain. Follow these instructions at home: Pay attention to any changes in your symptoms. Take these actions to help with your pain: Managing pain and stiffness     If told, put ice on the painful area. Your doctor may tell you to use ice for 24-48 hours after the flare-up starts. To do this: Put ice in a plastic bag. Place a towel between your skin and the bag. Leave the ice on for 20 minutes, 2-3 times a day. If told, put heat on the painful area. Do this as often as told by your doctor. Use the heat source that your doctor recommends, such as a moist heat pack or a heating pad. Place a towel between your skin and the heat source. Leave the heat on for 20-30 minutes. Take off the heat if your skin turns bright red. This is especially important if you are unable to feel pain, heat, or cold. You may have a greater risk of getting burned. Soak in a warm bath. This can help relieve pain. Activity  Avoid bending and other activities that make pain worse. When standing: Keep your upper back and  neck straight. Keep your shoulders pulled back. Avoid slouching. When sitting: Keep your back straight. Relax your shoulders. Do not round your shoulders or pull them backward. Do not sit or stand in one place for long periods of time. Take short rest breaks during the day. Lying down or  standing is usually better than sitting. Resting can help relieve pain. When sitting or lying down for a long time, do some mild activity or stretching. This will help to prevent stiffness and pain. Get regular exercise. Ask your doctor what activities are safe for you. Do not lift anything that is heavier than 10 lb (4.5 kg) or the limit that you are told, until your doctor says that it is safe. To prevent injury when you lift things: Bend your knees. Keep the weight close to your body. Avoid twisting. Sleep on a firm mattress. Try lying on your side with your knees slightly bent. If you lie on your back, put a pillow under your knees. Medicines Treatment may include medicines for pain and swelling taken by mouth or put on the skin, prescription pain medicine, or muscle relaxants. Take over-the-counter and prescription medicines only as told by your doctor. Ask your doctor if the medicine prescribed to you: Requires you to avoid driving or using machinery. Can cause trouble pooping (constipation). You may need to take these actions to prevent or treat trouble pooping: Drink enough fluid to keep your pee (urine) pale yellow. Take over-the-counter or prescription medicines. Eat foods that are high in fiber. These include beans, whole grains, and fresh fruits and vegetables. Limit foods that are high in fat and sugars. These include fried or sweet foods. General instructions Do not use any products that contain nicotine or tobacco, such as cigarettes, e-cigarettes, and chewing tobacco. If you need help quitting, ask your doctor. Keep all follow-up visits as told by your doctor. This is important. Contact a doctor if: Your pain does not get better with rest or medicine. Your pain gets worse, or you have new pain. You have a high fever. You lose weight very quickly. You have trouble doing your normal activities. Get help right away if: One or both of your legs or feet feel weak. One or both  of your legs or feet lose feeling (have numbness). You have trouble controlling when you poop (have a bowel movement) or pee (urinate). You have bad back pain and: You feel like you may vomit (nauseous), or you vomit. You have pain in your belly (abdomen). You have shortness of breath. You faint. Summary When back pain lasts longer than 3 months, it is called chronic back pain. Pain may get worse at certain times (flare-ups). Use ice and heat as told by your doctor. Your doctor may tell you to use ice after flare-ups. This information is not intended to replace advice given to you by your health care provider. Make sure you discuss any questions you have with your health care provider. Document Revised: 06/25/2019 Document Reviewed: 06/25/2019 Elsevier Patient Education  [place].
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
## 2022-01-12 ENCOUNTER — Other Ambulatory Visit: Payer: Self-pay | Admitting: Family Medicine

## 2022-01-26 DIAGNOSIS — J449 Chronic obstructive pulmonary disease, unspecified: Secondary | ICD-10-CM

## 2022-01-26 DIAGNOSIS — I10 Essential (primary) hypertension: Secondary | ICD-10-CM

## 2022-02-28 ENCOUNTER — Inpatient Hospital Stay (HOSPITAL_COMMUNITY)
Admission: RE | Admit: 2022-02-28 | Discharge: 2022-02-28 | Disposition: A | Discharge status: Home / Self Care | Payer: PPO | Source: Ambulatory Visit | Attending: Family Medicine | Admitting: Family Medicine

## 2022-02-28 ENCOUNTER — Ambulatory Visit (INDEPENDENT_AMBULATORY_CARE_PROVIDER_SITE_OTHER): Payer: PPO | Admitting: Family Medicine

## 2022-02-28 ENCOUNTER — Encounter (HOSPITAL_COMMUNITY): Payer: Self-pay | Admitting: Emergency Medicine

## 2022-02-28 ENCOUNTER — Inpatient Hospital Stay (HOSPITAL_COMMUNITY)
Admission: EM | Admit: 2022-02-28 | Discharge: 2022-03-03 | DRG: 394 | Disposition: A | Discharge status: Home / Self Care | Payer: PPO | Attending: Internal Medicine | Admitting: Internal Medicine

## 2022-02-28 ENCOUNTER — Other Ambulatory Visit: Payer: Self-pay

## 2022-02-28 VITALS — BP 137/70 | HR 71 | Temp 97.7°F | Ht 65.0 in | Wt 118.0 lb

## 2022-02-28 DIAGNOSIS — K56600 Partial intestinal obstruction, unspecified as to cause: Secondary | ICD-10-CM | POA: Insufficient documentation

## 2022-02-28 DIAGNOSIS — K56609 Unspecified intestinal obstruction, unspecified as to partial versus complete obstruction: Secondary | ICD-10-CM | POA: Diagnosis not present

## 2022-02-28 DIAGNOSIS — Z981 Arthrodesis status: Secondary | ICD-10-CM | POA: Diagnosis not present

## 2022-02-28 DIAGNOSIS — K439 Ventral hernia without obstruction or gangrene: Secondary | ICD-10-CM | POA: Diagnosis not present

## 2022-02-28 DIAGNOSIS — J449 Chronic obstructive pulmonary disease, unspecified: Secondary | ICD-10-CM | POA: Diagnosis present

## 2022-02-28 DIAGNOSIS — K219 Gastro-esophageal reflux disease without esophagitis: Secondary | ICD-10-CM | POA: Diagnosis not present

## 2022-02-28 DIAGNOSIS — Z9049 Acquired absence of other specified parts of digestive tract: Secondary | ICD-10-CM

## 2022-02-28 DIAGNOSIS — K5669 Other partial intestinal obstruction: Secondary | ICD-10-CM | POA: Diagnosis present

## 2022-02-28 DIAGNOSIS — Z8249 Family history of ischemic heart disease and other diseases of the circulatory system: Secondary | ICD-10-CM | POA: Diagnosis not present

## 2022-02-28 DIAGNOSIS — Z90710 Acquired absence of both cervix and uterus: Secondary | ICD-10-CM | POA: Diagnosis not present

## 2022-02-28 DIAGNOSIS — Z885 Allergy status to narcotic agent status: Secondary | ICD-10-CM | POA: Diagnosis not present

## 2022-02-28 DIAGNOSIS — M810 Age-related osteoporosis without current pathological fracture: Secondary | ICD-10-CM | POA: Diagnosis not present

## 2022-02-28 DIAGNOSIS — G8929 Other chronic pain: Secondary | ICD-10-CM | POA: Diagnosis not present

## 2022-02-28 DIAGNOSIS — R112 Nausea with vomiting, unspecified: Secondary | ICD-10-CM | POA: Insufficient documentation

## 2022-02-28 DIAGNOSIS — E876 Hypokalemia: Secondary | ICD-10-CM | POA: Diagnosis present

## 2022-02-28 DIAGNOSIS — Z8542 Personal history of malignant neoplasm of other parts of uterus: Secondary | ICD-10-CM | POA: Diagnosis not present

## 2022-02-28 DIAGNOSIS — K4031 Unilateral inguinal hernia, with obstruction, without gangrene, recurrent: Principal | ICD-10-CM | POA: Diagnosis present

## 2022-02-28 DIAGNOSIS — E7800 Pure hypercholesterolemia, unspecified: Secondary | ICD-10-CM | POA: Diagnosis present

## 2022-02-28 DIAGNOSIS — R1084 Generalized abdominal pain: Secondary | ICD-10-CM | POA: Insufficient documentation

## 2022-02-28 DIAGNOSIS — Z79899 Other long term (current) drug therapy: Secondary | ICD-10-CM

## 2022-02-28 DIAGNOSIS — I10 Essential (primary) hypertension: Secondary | ICD-10-CM | POA: Diagnosis present

## 2022-02-28 DIAGNOSIS — E785 Hyperlipidemia, unspecified: Secondary | ICD-10-CM | POA: Diagnosis present

## 2022-02-28 DIAGNOSIS — Z87891 Personal history of nicotine dependence: Secondary | ICD-10-CM | POA: Diagnosis not present

## 2022-02-28 DIAGNOSIS — Z833 Family history of diabetes mellitus: Secondary | ICD-10-CM

## 2022-02-28 DIAGNOSIS — K4091 Unilateral inguinal hernia, without obstruction or gangrene, recurrent: Secondary | ICD-10-CM | POA: Diagnosis not present

## 2022-02-28 DIAGNOSIS — Z888 Allergy status to other drugs, medicaments and biological substances status: Secondary | ICD-10-CM | POA: Diagnosis not present

## 2022-02-28 DIAGNOSIS — I2510 Atherosclerotic heart disease of native coronary artery without angina pectoris: Secondary | ICD-10-CM | POA: Diagnosis not present

## 2022-02-28 LAB — COMPREHENSIVE METABOLIC PANEL
ALT: 23 U/L (ref 0–44)
AST: 38 U/L (ref 15–41)
Albumin: 4.5 g/dL (ref 3.5–5.0)
Alkaline Phosphatase: 78 U/L (ref 38–126)
Anion gap: 9 (ref 5–15)
BUN: 6 mg/dL — ABNORMAL LOW (ref 8–23)
CO2: 28 mmol/L (ref 22–32)
Calcium: 9.8 mg/dL (ref 8.9–10.3)
Chloride: 101 mmol/L (ref 98–111)
Creatinine, Ser: 0.57 mg/dL (ref 0.44–1.00)
GFR, Estimated: >60 mL/min (ref >60)
Glucose, Bld: 118 mg/dL — ABNORMAL HIGH (ref 70–99)
Potassium: 3.4 mmol/L — ABNORMAL LOW (ref 3.5–5.1)
Sodium: 138 mmol/L (ref 135–145)
Total Bilirubin: 0.3 mg/dL (ref 0.3–1.2)
Total Protein: 8.1 g/dL (ref 6.5–8.1)

## 2022-02-28 LAB — URINALYSIS, ROUTINE W REFLEX MICROSCOPIC
Bacteria, UA: NONE SEEN
Bilirubin Urine: NEGATIVE
Glucose, UA: NEGATIVE mg/dL
Hgb urine dipstick: NEGATIVE
Ketones, ur: NEGATIVE mg/dL
Nitrite: NEGATIVE
Protein, ur: NEGATIVE mg/dL
Specific Gravity, Urine: >1.046 — ABNORMAL HIGH (ref 1.005–1.030)
pH: 6 (ref 5.0–8.0)

## 2022-02-28 LAB — CBC WITH DIFFERENTIAL/PLATELET
Abs Immature Granulocytes: 0.01 10*3/uL (ref 0.00–0.07)
Basophils Absolute: 0.1 10*3/uL (ref 0.0–0.1)
Basophils Relative: 1 %
Eosinophils Absolute: 0.2 10*3/uL (ref 0.0–0.5)
Eosinophils Relative: 2 %
HCT: 43.6 % (ref 36.0–46.0)
Hemoglobin: 13.9 g/dL (ref 12.0–15.0)
Immature Granulocytes: 0 %
Lymphocytes Relative: 23 %
Lymphs Abs: 1.6 10*3/uL (ref 0.7–4.0)
MCH: 31.8 pg (ref 26.0–34.0)
MCHC: 31.9 g/dL (ref 30.0–36.0)
MCV: 99.8 fL (ref 80.0–100.0)
Monocytes Absolute: 0.7 10*3/uL (ref 0.1–1.0)
Monocytes Relative: 11 %
Neutro Abs: 4.4 10*3/uL (ref 1.7–7.7)
Neutrophils Relative %: 63 %
Platelets: 236 10*3/uL (ref 150–400)
RBC: 4.37 MIL/uL (ref 3.87–5.11)
RDW: 12.4 % (ref 11.5–15.5)
WBC: 6.9 10*3/uL (ref 4.0–10.5)
nRBC: 0 % (ref 0.0–0.2)

## 2022-02-28 LAB — POCT I-STAT CREATININE: Creatinine, Ser: 0.5 mg/dL (ref 0.44–1.00)

## 2022-02-28 LAB — LIPASE, BLOOD: Lipase: 36 U/L (ref 11–51)

## 2022-02-28 MED ORDER — HYDRALAZINE HCL 20 MG/ML IJ SOLN: 10.0000 mg | Freq: Four times a day (QID) | INTRAMUSCULAR | Status: DC | PRN

## 2022-02-28 MED ORDER — ONDANSETRON HCL 4 MG/2ML IJ SOLN
4.0000 mg | Freq: Four times a day (QID) | INTRAMUSCULAR | Status: DC | PRN
  Administered 2022-03-01: 4 mg via INTRAVENOUS
  Filled 2022-02-28: qty 2

## 2022-02-28 MED ORDER — POTASSIUM CHLORIDE 10 MEQ/100ML IV SOLN
10.0000 meq | INTRAVENOUS | Status: AC
  Administered 2022-03-01 (×3): 10 meq via INTRAVENOUS
  Filled 2022-02-28 (×3): qty 100

## 2022-02-28 MED ORDER — LACTATED RINGERS IV SOLN: INTRAVENOUS | Status: AC

## 2022-02-28 MED ORDER — ALBUTEROL SULFATE (2.5 MG/3ML) 0.083% IN NEBU: 2.5000 mg | INHALATION_SOLUTION | Freq: Four times a day (QID) | RESPIRATORY_TRACT | Status: DC | PRN

## 2022-02-28 MED ORDER — ACETAMINOPHEN 325 MG PO TABS
650.0000 mg | ORAL_TABLET | Freq: Four times a day (QID) | ORAL | Status: DC | PRN
  Administered 2022-03-02: 650 mg via ORAL
  Filled 2022-02-28: qty 2

## 2022-02-28 MED ORDER — FENTANYL CITRATE PF 50 MCG/ML IJ SOSY
50.0000 ug | PREFILLED_SYRINGE | Freq: Once | INTRAMUSCULAR | Status: AC
  Administered 2022-02-28: 50 ug via INTRAVENOUS
  Filled 2022-02-28: qty 1

## 2022-02-28 MED ORDER — HYDROMORPHONE HCL 1 MG/ML IJ SOLN
0.5000 mg | INTRAMUSCULAR | Status: DC | PRN
  Administered 2022-03-01 – 2022-03-03 (×14): 0.5 mg via INTRAVENOUS
  Filled 2022-02-28 (×2): qty 0.5
  Filled 2022-02-28: qty 1
  Filled 2022-02-28 (×2): qty 0.5
  Filled 2022-02-28 (×2): qty 1
  Filled 2022-02-28 (×7): qty 0.5

## 2022-02-28 MED ORDER — IOHEXOL 300 MG/ML  SOLN
100.0000 mL | Freq: Once | INTRAMUSCULAR | Status: AC | PRN
  Administered 2022-02-28: 80 mL via INTRAVENOUS

## 2022-02-28 MED ORDER — ACETAMINOPHEN 650 MG RE SUPP: 650.0000 mg | Freq: Four times a day (QID) | RECTAL | Status: DC | PRN

## 2022-02-28 NOTE — H&P (Signed)
History and Physical    Terri Harris CLE:[id number] DOB: Apr 14, 1941 DOA: 02/28/2022  PCP: Kathyrn Drown, MD  Patient coming from: Home  I have personally briefly reviewed patient's old medical records in [place]  Chief Complaint: Bowel obstruction  HPI: Terri Harris is a 81 y.o. female with medical history significant for COPD, HTN, HLD, GERD, chronic pain on long-term Percocet, history of SBO s/p small bowel resection in 2015 by Dr. Ninfa Linden who presented to the ED for evaluation of bowel obstruction.  Patient reports developing abdominal pain and bloating 5 days prior to admission.  She had associated nausea and vomiting.  She was unable to maintain any adequate oral intake following few days.  This felt similar to her prior bowel obstruction.  She did try to take some fluids in the last 2 days without success.  Last bowel movement was on 10/2 and consisted of watery stool.  She is passing some intermittent flatus.  She is not having much nausea or vomiting anymore.  Abdominal pain is fairly well controlled.  She saw her PCP earlier today (10/3). CT abdomen/pelvis with contrast was obtained and showed a knuckle of descending colon within the lateral hernia in the left abdomen just above the iliac crest.  Descending colon is decompressed downstream from this in the colon upstream from this knuckle of bowel is fluid-filled and prominent without frank dilation.  Findings felt to reflect early or partial colonic obstruction.  Patient was advised to present to the ED for further evaluation and management.  ED Course  Labs/Imaging on admission: I have personally reviewed following labs and imaging studies.  Initial vitals showed BP 145/71, pulse 64, RR 20, temp 97.9 F, SPO2 100% on room air.  Labs show sodium 138, potassium 3.4, bicarb 28, BUN 6, creatinine 0.57, serum glucose 118, LFTs within normal limits, lipase 36, WBC 6.9, hemoglobin 13.9, platelets 236,000.  Patient was  given IV fentanyl 50 mcg.  General surgery consulted and recommended conservative management overnight but suggest may need surgical repair soon.  The hospitalist service was consulted to admit for further evaluation and management.  Review of Systems: All systems reviewed and are negative except as documented in history of present illness above.   Past Medical History:  Diagnosis Date   ANA positive 01/2001   Arthritis    Arthritis 07/02/2013   ANA positive   Cancer (HCC)    partial hysterectomy age 74-uterine cancer   Coronary artery calcification 09/28/2021   GERD (gastroesophageal reflux disease)    Hx of tobacco use, presenting hazards to health 07/02/2013   Hypercholesteremia    Hypertension    Ileus, postoperative ([place]) 07/02/2013   Osteoporosis    Pneumonia    hx of   SBO (small bowel obstruction) ([place]) 06/20/2103    Past Surgical History:  Procedure Laterality Date   ABDOMINAL HYSTERECTOMY     ANTERIOR LAT LUMBAR FUSION N/A 06/08/2014   Procedure: LUMBAR 2-3 LUMBAR 3-4 LUMBAR 4-5 ANTEROLATERAL DECOMPRESSION/FUSION WITH PERCUTANEOUS PEDICLE SCREWS;  Surgeon: Kristeen Miss, MD;  Location: [facility];  Service: Neurosurgery;  Laterality: N/A;  L2-3 L3-4 L4-5 ANTEROLATERAL DECOMPRESSION/FUSION WITH PERCUTANEOUS PEDICLE SCREWS   ANTERIOR LAT LUMBAR FUSION Left 01/07/2019   Procedure: Lumbar one-two Anterolateral lumbar interbody fusion with lateral plate fixation;  Surgeon: Kristeen Miss, MD;  Location: [place];  Service: Neurosurgery;  Laterality: Left;   BACK SURGERY     BOWEL RESECTION N/A 06/20/2013   Procedure: SMALL BOWEL RESECTION;  Surgeon:  Harl Bowie, MD;  Location: [place];  Service: General;  Laterality: N/A;   COLON SURGERY     COLONOSCOPY  10/25/2011   Procedure: COLONOSCOPY;  Surgeon: Rogene Houston, MD;  [facility];  Service: Endoscopy;  Laterality: N/A;  930   COLONOSCOPY N/A 05/09/2017   Procedure: COLONOSCOPY;  Surgeon: Rogene Houston, MD;   [facility];  Service: Endoscopy;  Laterality: N/A;  1030   ESOPHAGEAL DILATION N/A 05/25/2015   Procedure: ESOPHAGEAL DILATION;  Surgeon: Rogene Houston, MD;  [facility];  Service: Endoscopy;  Laterality: N/A;   ESOPHAGOGASTRODUODENOSCOPY  02/2009   ESOPHAGOGASTRODUODENOSCOPY N/A 05/25/2015   Procedure: ESOPHAGOGASTRODUODENOSCOPY (EGD);  Surgeon: Rogene Houston, MD;  [facility];  Service: Endoscopy;  Laterality: N/A;   HERNIA REPAIR Left    groin   INGUINAL HERNIA REPAIR Left 09/11/2013   Procedure: HERNIA REPAIR INGUINAL INCARCERATED;  Surgeon: Harl Bowie, MD;  Location: [place];  Service: General;  Laterality: Left;   LAPAROTOMY N/A 06/20/2013   Procedure: EXPLORATORY LAPAROTOMY;  Surgeon: Harl Bowie, MD;  Location: [place];  Service: General;  Laterality: N/A;   LUMBAR PERCUTANEOUS PEDICLE SCREW 3 LEVEL N/A 06/08/2014   Procedure: LUMBAR PERCUTANEOUS PEDICLE SCREW 3 LEVEL;  Surgeon: Kristeen Miss, MD;  Location: [facility];  Service: Neurosurgery;  Laterality: N/A;   POLYPECTOMY  05/09/2017   Procedure: POLYPECTOMY;  Surgeon: Rogene Houston, MD;  [facility];  Service: Endoscopy;;  sigmoid colon   TONSILLECTOMY      Social History:  reports that she quit smoking about 10 years ago. Her smoking use included cigarettes. She smoked an average of 0.25 packs per day. She has never used smokeless tobacco. She reports that she does not drink alcohol and does not use drugs.  Allergies  Allergen Reactions   Codeine Nausea And Vomiting   Fosamax [Alendronate Sodium] Other (See Comments)    gastritis    Family History  Problem Relation Age of Onset   Hypertension Mother    Heart disease Father    Diabetes Sister    Heart disease Sister    Diabetes Brother    Heart disease Brother    Anesthesia problems Neg Hx    Hypotension Neg Hx    Malignant hyperthermia Neg Hx    Pseudochol deficiency Neg Hx      Prior to  Admission medications   Medication Sig Start Date End Date Taking? Authorizing Provider  acetaminophen (TYLENOL) 325 MG tablet Take 2 tablets (650 mg total) by mouth every 6 (six) hours as needed for mild pain (or Fever >/= 101). 04/03/21   Roxan Hockey, MD  albuterol (VENTOLIN HFA) 108 (90 Base) MCG/ACT inhaler Inhale 2 puffs into the lungs every 6 (six) hours as needed for wheezing or shortness of breath. 09/01/21   Coral Spikes, DO  alum & mag hydroxide-simeth (MAALOX/MYLANTA) 200-200-20 MG/5ML suspension Take 15 mLs by mouth every 6 (six) hours as needed for indigestion or heartburn.    [provider]  amLODipine (NORVASC) 5 MG tablet Take 1 tablet (5 mg total) by mouth daily. 12/29/21   Kathyrn Drown, MD  cyanocobalamin 1000 MCG tablet Take 1,000 mcg by mouth daily.    [provider]  oxyCODONE-acetaminophen (PERCOCET/ROXICET) 5-325 MG tablet 1 taken 3-4 times per day to last 30 days 12/29/21   Kathyrn Drown, MD  oxyCODONE-acetaminophen (PERCOCET/ROXICET) 5-325 MG tablet 1 pill taken 4 times  per day as needed for pain to last 30 days 12/29/21   Kathyrn Drown, MD  oxyCODONE-acetaminophen (PERCOCET/ROXICET) 5-325 MG tablet 1 pill taken 3 or 4 times daily as needed for pain to last 30 days 12/29/21   Kathyrn Drown, MD  pantoprazole (PROTONIX) 40 MG tablet TAKE 1 TABLET BY MOUTH EVERY DAY 01/13/22   Kathyrn Drown, MD  polyethylene glycol (MIRALAX / GLYCOLAX) 17 g packet Take 17 g by mouth daily. 04/03/21   Roxan Hockey, MD  rosuvastatin (CRESTOR) 10 MG tablet Take 1 tablet (10 mg total) by mouth daily. 12/29/21   Kathyrn Drown, MD    Physical Exam: Vitals:   02/28/22 2144 02/28/22 2230 02/28/22 2300 02/28/22 2315  BP: (!) 145/71 (!) 145/71 (!) 145/74 127/62  Pulse: 64 64 65 69  Resp: 20 (!) '24 19 16  '$ Temp: 97.9 F (36.6 C)     TempSrc: Oral     SpO2: 100% 100% 98% 97%   Constitutional: Resting supine in bed, NAD, calm, comfortable Eyes: EOMI, lids and  conjunctivae normal ENMT: Mucous membranes are moist. Posterior pharynx clear of any exudate or lesions.Normal dentition.  Neck: normal, supple, no masses. Respiratory: clear to auscultation bilaterally, no wheezing, no crackles. Normal respiratory effort. No accessory muscle use.  Cardiovascular: Regular rate and rhythm, no murmurs / rubs / gallops. No extremity edema. 2+ pedal pulses. Abdomen: Mild LLQ tenderness, Bowel sounds positive.  Musculoskeletal: no clubbing / cyanosis. No joint deformity upper and lower extremities. Good ROM, no contractures. Normal muscle tone.  Skin: no rashes, lesions, ulcers. No induration Neurologic: Sensation intact. Strength 5/5 in all 4.  Psychiatric: Normal judgment and insight. Alert and oriented x 3. Normal mood.   EKG: Personally reviewed. Sinus rhythm, no acute ischemic changes, motion artifact.  Similar to prior.  Assessment/Plan Principal Problem:   Colonic obstruction (HCC) Active Problems:   COPD (chronic obstructive pulmonary disease) (HCC)   HTN (hypertension)   Hypokalemia   Hyperlipidemia   Chronic pain   Terri Harris is a 81 y.o. female with medical history significant for COPD, HTN, HLD, GERD, chronic pain on long-term Percocet, history of SBO s/p small bowel resection in 2015 by Dr. Ninfa Linden who is admitted with colonic obstruction due to a left inguinal hernia.  General surgery to determine definitive management plan.  Assessment and Plan: * Colonic obstruction (HCC) Partial colonic obstruction from recurrent left inguinal hernia. -Keep n.p.o. -Start IV fluid hydration overnight -IV analgesics and antiemetics as needed -Rest of management per general surgery  COPD (chronic obstructive pulmonary disease) (HCC) Stable, no wheezing on admission.  Continue albuterol as needed.  HTN (hypertension) Amlodipine on hold while NPO.  Hypokalemia IV supplement ordered.  Chronic pain Holding home Percocet while NPO.  Using IV  Dilaudid as needed.  Hyperlipidemia Statin held while NPO.  DVT prophylaxis: SCDs Start: 02/28/22 2350 Code Status: Full code, confirmed with patient on admission Family Communication: Discussed with patient, she has discussed with family Disposition Plan: From home, dispo pending clinical progress Consults called: General surgery Severity of Illness: The appropriate patient status for this patient is INPATIENT. Inpatient status is judged to be reasonable and necessary in order to provide the required intensity of service to ensure the patient's safety. The patient's presenting symptoms, physical exam findings, and initial radiographic and laboratory data in the context of their chronic comorbidities is felt to place them at high risk for further clinical deterioration. Furthermore, it is not anticipated that the patient  will be medically stable for discharge from the hospital within 2 midnights of admission.   * I certify that at the point of admission it is my clinical judgment that the patient will require inpatient hospital care spanning beyond 2 midnights from the point of admission due to high intensity of service, high risk for further deterioration and high frequency of surveillance required.Zada Finders MD [facility]  If 7PM-7AM, please contact night-coverage [email or web address]  02/28/2022, 11:59 PM

## 2022-02-28 NOTE — Assessment & Plan Note (Signed)
Amlodipine on hold while NPO.

## 2022-02-28 NOTE — Assessment & Plan Note (Signed)
Statin held while NPO.

## 2022-02-28 NOTE — Consult Note (Signed)
Consulting Physician: Nickola Major Phong Isenberg  Referring Provider: Dr. Liborio Nixon - Primary Care Provider  Chief Complaint: Abdominal pain, nausea, vomiting  Reason for Consult: Bowel containing left lower abdominal hernia   Subjective   HPI: Terri Harris is an 81 y.o. female who is here for abdominal pain.  Terri Harris underwent exploratory laparotomy with small bowel resection for bowel obstruction on June 20, 2013.  Later that year she underwent open left inguinal hernia repair on 09/11/2013.  Recently she is dealt with multiple bowel obstructions, and has had transition points at the small bowel anastomosis on imaging.  For the last few days she has been having vague abdominal pain across the lower abdomen nausea and vomiting with obstipation.  She did finally have a bowel movement this morning.  Her pain nausea and vomiting have improved slightly since this.  An outpatient CT scan was completed and Dr. Liborio Nixon called me from his office to review the results.  There is concern for a knuckle of the descending colon being stuck in a hernia near the left inguinal region.  I recommended she be transferred to the emergency room for evaluation.  Past Medical History:  Diagnosis Date   ANA positive 01/2001   Arthritis    Arthritis 07/02/2013   ANA positive   Cancer (HCC)    partial hysterectomy age 54-uterine cancer   Coronary artery calcification 09/28/2021   GERD (gastroesophageal reflux disease)    Hx of tobacco use, presenting hazards to health 07/02/2013   Hypercholesteremia    Hypertension    Ileus, postoperative ([place]) 07/02/2013   Osteoporosis    Pneumonia    hx of   SBO (small bowel obstruction) ([place]) 06/20/2103    Past Surgical History:  Procedure Laterality Date   ABDOMINAL HYSTERECTOMY     ANTERIOR LAT LUMBAR FUSION N/A 06/08/2014   Procedure: LUMBAR 2-3 LUMBAR 3-4 LUMBAR 4-5 ANTEROLATERAL DECOMPRESSION/FUSION WITH PERCUTANEOUS PEDICLE SCREWS;  Surgeon: Kristeen Miss, MD;  Location:  [facility];  Service: Neurosurgery;  Laterality: N/A;  L2-3 L3-4 L4-5 ANTEROLATERAL DECOMPRESSION/FUSION WITH PERCUTANEOUS PEDICLE SCREWS   ANTERIOR LAT LUMBAR FUSION Left 01/07/2019   Procedure: Lumbar one-two Anterolateral lumbar interbody fusion with lateral plate fixation;  Surgeon: Kristeen Miss, MD;  Location: [place];  Service: Neurosurgery;  Laterality: Left;   BACK SURGERY     BOWEL RESECTION N/A 06/20/2013   Procedure: SMALL BOWEL RESECTION;  Surgeon: Harl Bowie, MD;  Location: [place];  Service: General;  Laterality: N/A;   COLON SURGERY     COLONOSCOPY  10/25/2011   Procedure: COLONOSCOPY;  Surgeon: Rogene Houston, MD;  [facility];  Service: Endoscopy;  Laterality: N/A;  930   COLONOSCOPY N/A 05/09/2017   Procedure: COLONOSCOPY;  Surgeon: Rogene Houston, MD;  [facility];  Service: Endoscopy;  Laterality: N/A;  1030   ESOPHAGEAL DILATION N/A 05/25/2015   Procedure: ESOPHAGEAL DILATION;  Surgeon: Rogene Houston, MD;  [facility];  Service: Endoscopy;  Laterality: N/A;   ESOPHAGOGASTRODUODENOSCOPY  02/2009   ESOPHAGOGASTRODUODENOSCOPY N/A 05/25/2015   Procedure: ESOPHAGOGASTRODUODENOSCOPY (EGD);  Surgeon: Rogene Houston, MD;  [facility];  Service: Endoscopy;  Laterality: N/A;   HERNIA REPAIR Left    groin   INGUINAL HERNIA REPAIR Left 09/11/2013   Procedure: HERNIA REPAIR INGUINAL INCARCERATED;  Surgeon: Harl Bowie, MD;  Location: [place];  Service: General;  Laterality: Left;   LAPAROTOMY N/A 06/20/2013   Procedure: EXPLORATORY LAPAROTOMY;  Surgeon: Harl Bowie, MD;  Location: [place];  Service: General;  Laterality: N/A;   LUMBAR PERCUTANEOUS PEDICLE SCREW 3 LEVEL N/A 06/08/2014   Procedure: LUMBAR PERCUTANEOUS PEDICLE SCREW 3 LEVEL;  Surgeon: Kristeen Miss, MD;  Location: [place] [facility];  Service: Neurosurgery;  Laterality: N/A;   POLYPECTOMY  05/09/2017   Procedure: POLYPECTOMY;  Surgeon: Rogene Houston, MD;   [facility];  Service: Endoscopy;;  sigmoid colon   TONSILLECTOMY      Family History  Problem Relation Age of Onset   Hypertension Mother    Heart disease Father    Diabetes Sister    Heart disease Sister    Diabetes Brother    Heart disease Brother    Anesthesia problems Neg Hx    Hypotension Neg Hx    Malignant hyperthermia Neg Hx    Pseudochol deficiency Neg Hx     Social:  reports that she quit smoking about 10 years ago. Her smoking use included cigarettes. She smoked an average of 0.25 packs per day. She has never used smokeless tobacco. She reports that she does not drink alcohol and does not use drugs.  Allergies:  Allergies  Allergen Reactions   Codeine Nausea And Vomiting   Fosamax [Alendronate Sodium] Other (See Comments)    gastritis    Medications: Current Outpatient Medications  Medication Instructions   acetaminophen (TYLENOL) 650 mg, Oral, Every 6 hours PRN   albuterol (VENTOLIN HFA) 108 (90 Base) MCG/ACT inhaler 2 puffs, Inhalation, Every 6 hours PRN   alum & mag hydroxide-simeth (MAALOX/MYLANTA) 200-200-20 MG/5ML suspension 15 mLs, Oral, Every 6 hours PRN   amLODipine (NORVASC) 5 mg, Oral, Daily   cyanocobalamin 1,000 mcg, Oral, Daily   oxyCODONE-acetaminophen (PERCOCET/ROXICET) 5-325 MG tablet 1 taken 3-4 times per day to last 30 days   oxyCODONE-acetaminophen (PERCOCET/ROXICET) 5-325 MG tablet 1 pill taken 4 times per day as needed for pain to last 30 days   oxyCODONE-acetaminophen (PERCOCET/ROXICET) 5-325 MG tablet 1 pill taken 3 or 4 times daily as needed for pain to last 30 days   pantoprazole (PROTONIX) 40 mg, Oral, Daily   polyethylene glycol (MIRALAX / GLYCOLAX) 17 g, Oral, Daily   rosuvastatin (CRESTOR) 10 mg, Oral, Daily    ROS - all of the below systems have been reviewed with the patient and positives are indicated with bold text General: chills, fever or night sweats Eyes: blurry vision or double vision ENT: epistaxis or sore  throat Allergy/Immunology: itchy/watery eyes or nasal congestion Hematologic/Lymphatic: bleeding problems, blood clots or swollen lymph nodes Endocrine: temperature intolerance or unexpected weight changes Breast: new or changing breast lumps or nipple discharge Resp: cough, shortness of breath, or wheezing CV: chest pain or dyspnea on exertion GI: as per HPI GU: dysuria, trouble voiding, or hematuria MSK: joint pain or joint stiffness Neuro: TIA or stroke symptoms Derm: pruritus and skin lesion changes Psych: anxiety and depression  Objective   PE Blood pressure (!) 145/71, pulse 64, temperature 97.9 F (36.6 C), temperature source Oral, resp. rate 20, SpO2 100 %. Constitutional: NAD; conversant; no deformities Eyes: Moist conjunctiva; no lid lag; anicteric; PERRL Neck: Trachea midline; no thyromegaly Lungs: Normal respiratory effort; no tactile fremitus CV: RRR; no palpable thrills; no pitting edema GI: Abd soft, mild tenderness.  Point tenderness in the left groin, though it is difficult to palpate a clear reducible hernia or a clear fascial defect.  Midline laparotomy scar well-healed.  Left lower quadrant inguinal hernia scar well-healed. MSK:  Normal range of motion of extremities; no clubbing/cyanosis Psychiatric: Appropriate affect; alert and oriented x3 Lymphatic: No palpable cervical or axillary lymphadenopathy  Results for orders placed or performed during the hospital encounter of 02/28/22 (from the past 24 hour(s))  Urinalysis, Routine w reflex microscopic     Status: Abnormal   Collection Time: 02/28/22  9:54 PM  Result Value Ref Range   Color, Urine YELLOW YELLOW   APPearance CLEAR CLEAR   Specific Gravity, Urine >1.046 (H) 1.005 - 1.030   pH 6.0 5.0 - 8.0   Glucose, UA NEGATIVE NEGATIVE mg/dL   Hgb urine dipstick NEGATIVE NEGATIVE   Bilirubin Urine NEGATIVE NEGATIVE   Ketones, ur NEGATIVE NEGATIVE mg/dL   Protein, ur NEGATIVE NEGATIVE mg/dL   Nitrite NEGATIVE  NEGATIVE   Leukocytes,Ua TRACE (A) NEGATIVE   RBC / HPF 0-5 0 - 5 RBC/hpf   WBC, UA 0-5 0 - 5 WBC/hpf   Bacteria, UA NONE SEEN NONE SEEN   Squamous Epithelial / LPF 0-5 0 - 5  CBC with Differential     Status: None   Collection Time: 02/28/22 10:01 PM  Result Value Ref Range   WBC 6.9 4.0 - 10.5 K/uL   RBC 4.37 3.87 - 5.11 MIL/uL   Hemoglobin 13.9 12.0 - 15.0 g/dL   HCT 43.6 36.0 - 46.0 %   MCV 99.8 80.0 - 100.0 fL   MCH 31.8 26.0 - 34.0 pg   MCHC 31.9 30.0 - 36.0 g/dL   RDW 12.4 11.5 - 15.5 %   Platelets 236 150 - 400 K/uL   nRBC 0.0 0.0 - 0.2 %   Neutrophils Relative % 63 %   Neutro Abs 4.4 1.7 - 7.7 K/uL   Lymphocytes Relative 23 %   Lymphs Abs 1.6 0.7 - 4.0 K/uL   Monocytes Relative 11 %   Monocytes Absolute 0.7 0.1 - 1.0 K/uL   Eosinophils Relative 2 %   Eosinophils Absolute 0.2 0.0 - 0.5 K/uL   Basophils Relative 1 %   Basophils Absolute 0.1 0.0 - 0.1 K/uL   Immature Granulocytes 0 %   Abs Immature Granulocytes 0.01 0.00 - 0.07 K/uL    Imaging Orders  No imaging studies ordered today     Assessment and Plan   Terri Harris is an 81 y.o. female with abdominal pain.  Appears she may have a partial obstruction from the recurrent left inguinal hernia.  Recommend observing her symptoms overnight.  She may benefit from surgery to repair this hernia as early as tomorrow.  I will discuss the case with my partner Dr. Donne Hazel who takes over the emergency general surgery service in the morning.   Felicie Morn, MD  [facility] Use [email or web address] to contact on call provider  New Patient Billing: [phone] - High MDM

## 2022-02-28 NOTE — Assessment & Plan Note (Signed)
Stable, no wheezing on admission.  Continue albuterol as needed. 

## 2022-02-28 NOTE — Hospital Course (Signed)
Terri Harris is a 81 y.o. female with medical history significant for COPD, HTN, HLD, GERD, chronic pain on long-term Percocet, history of SBO s/p small bowel resection in 2015 by Dr. Ninfa Linden who is admitted with colonic obstruction due to a left inguinal hernia.  General surgery to determine definitive management plan.

## 2022-02-28 NOTE — Progress Notes (Addendum)
   Subjective:    Patient ID: Terri Harris, female    DOB: [birth year], 81 y.o.   MRN: [id number]  HPI Bloating and feeling full quickly after a couple of bites x 5 days Vomiting last week after full meal, last BM was today, Hx of bowel obstruction and surgery  Surgeon suggests CT - Dr Ninfa Linden Patient relates that she has had partial small bowel obstruction before things were going well until approximately 4 5 days ago then she started feeling bloated with increased discomfort off and on nausea with some vomiting.  She has been able to eat soft diet the past day along with some liquids she is using MiraLAX once per day she denies high fever no bilious vomiting no black tarry bowel movements  Review of Systems     Objective:   Physical Exam  General-in no acute distress Eyes-no discharge Lungs-respiratory rate normal, CTA CV-no murmurs,RRR Extremities skin warm dry no edema Neuro grossly normal Behavior normal, alert Abdomen is soft yet at the same time tenderness throughout no guarding or rebound there is moderate swelling.  Positive bowel sounds.      Assessment & Plan:  "Picture points toward partial small bowel obstruction.  Soft diet recommended.  CAT scan necessary to exclude other entities.  Stat scan as well as stat lab work indicated.  May have to go through the ER if his scan shows worrisome findings MiraLAX daily Soft diet More than likely referral back to Dr. Ninfa Linden but scan first Patient did not want to go to ER today If patient does develop progressive vomiting worsening pain she will need to go to the ER   Patient states that she did go to the lab to get blood drawn but apparently they did not draw the blood  She then went to radiology department had the scan done.  Scan was called to [place] this evening approximately 7 PM showed partial colon herniation with partial obstruction.  Spoke with Dr. Elodia Florence for [facility] surgery.  It was advised for the patient  to go to the emergency department for further evaluation.  Patient was spoken to she will go directly to emergency department at this point for further evaluation at [facility]  May end up with observation admission or possibility of surgery

## 2022-02-28 NOTE — ED Provider Notes (Signed)
[facility] EMERGENCY DEPARTMENT Provider Note   CSN: [id number] Arrival date & time: 02/28/22  2016     History  Chief Complaint  Patient presents with   Colon Hernia/Obstruction     Terri Harris is a 81 y.o. female.  The history is provided by the patient and medical records.   81 year old female with history of COPD, osteoporosis, GERD, hypertension, hyperlipidemia, presenting to the ED with abdominal pain.  States last Thursday after eating a large meal around 6 PM she became incredibly bloated with distended abdomen.  States she tried to force self to belch and have a bowel movement but unable to produce much.  Did have an episode of emesis after this.  Over the next few days pain persisted, still unable to pass much stool but no vomiting.  Saw PCP earlier today who sent her for outpatient CT scan which is concerning for colonic obstruction.  She was sent to the ED for further evaluation.  Prior abdominal surgeries include inguinal hernia repair, bowel resection, additional hernia repair.  Home Medications Prior to Admission medications   Medication Sig Start Date End Date Taking? Authorizing Provider  acetaminophen (TYLENOL) 325 MG tablet Take 2 tablets (650 mg total) by mouth every 6 (six) hours as needed for mild pain (or Fever >/= 101). 04/03/21   Roxan Hockey, MD  albuterol (VENTOLIN HFA) 108 (90 Base) MCG/ACT inhaler Inhale 2 puffs into the lungs every 6 (six) hours as needed for wheezing or shortness of breath. 09/01/21   Coral Spikes, DO  alum & mag hydroxide-simeth (MAALOX/MYLANTA) 200-200-20 MG/5ML suspension Take 15 mLs by mouth every 6 (six) hours as needed for indigestion or heartburn.    [provider]  amLODipine (NORVASC) 5 MG tablet Take 1 tablet (5 mg total) by mouth daily. 12/29/21   Kathyrn Drown, MD  cyanocobalamin 1000 MCG tablet Take 1,000 mcg by mouth daily.    [provider]  oxyCODONE-acetaminophen (PERCOCET/ROXICET)  5-325 MG tablet 1 taken 3-4 times per day to last 30 days 12/29/21   Kathyrn Drown, MD  oxyCODONE-acetaminophen (PERCOCET/ROXICET) 5-325 MG tablet 1 pill taken 4 times per day as needed for pain to last 30 days 12/29/21   Kathyrn Drown, MD  oxyCODONE-acetaminophen (PERCOCET/ROXICET) 5-325 MG tablet 1 pill taken 3 or 4 times daily as needed for pain to last 30 days 12/29/21   Kathyrn Drown, MD  pantoprazole (PROTONIX) 40 MG tablet TAKE 1 TABLET BY MOUTH EVERY DAY 01/13/22   Kathyrn Drown, MD  polyethylene glycol (MIRALAX / GLYCOLAX) 17 g packet Take 17 g by mouth daily. 04/03/21   Roxan Hockey, MD  rosuvastatin (CRESTOR) 10 MG tablet Take 1 tablet (10 mg total) by mouth daily. 12/29/21   Kathyrn Drown, MD      Allergies    Codeine and Fosamax [alendronate sodium]    Review of Systems   Review of Systems  Gastrointestinal:  Positive for abdominal distention and abdominal pain.  All other systems reviewed and are negative.   Physical Exam Updated Vital Signs BP (!) 145/71   Pulse 64   Temp 97.9 F (36.6 C) (Oral)   Resp 20   LMP  (LMP Unknown)   SpO2 100%   Physical Exam Vitals and nursing note reviewed.  Constitutional:      Appearance: She is well-developed.     Comments: Elderly, non-toxic in appearance  HENT:     Head: Normocephalic and atraumatic.  Eyes:  Conjunctiva/sclera: Conjunctivae normal.     Pupils: Pupils are equal, round, and reactive to light.  Cardiovascular:     Rate and Rhythm: Normal rate and regular rhythm.     Heart sounds: Normal heart sounds.  Pulmonary:     Effort: Pulmonary effort is normal. No respiratory distress.     Breath sounds: Normal breath sounds. No rhonchi.  Abdominal:     General: Bowel sounds are normal. There is distension.     Palpations: Abdomen is soft.     Tenderness: There is abdominal tenderness. There is no rebound.     Comments: Abdominal distention noted, generalized tenderness  Musculoskeletal:        General:  Normal range of motion.     Cervical back: Normal range of motion.  Skin:    General: Skin is warm and dry.  Neurological:     Mental Status: She is alert and oriented to person, place, and time.     ED Results / Procedures / Treatments   Labs (all labs ordered are listed, but only abnormal results are displayed) Labs Reviewed  COMPREHENSIVE METABOLIC PANEL - Abnormal; Notable for the following components:      Result Value   Potassium 3.4 (*)    Glucose, Bld 118 (*)    BUN 6 (*)    All other components within normal limits  URINALYSIS, ROUTINE W REFLEX MICROSCOPIC - Abnormal; Notable for the following components:   Specific Gravity, Urine >1.046 (*)    Leukocytes,Ua TRACE (*)    All other components within normal limits  CBC WITH DIFFERENTIAL/PLATELET  LIPASE, BLOOD    EKG EKG Interpretation  Date/Time:  Tuesday February 28 2022 21:49:24 EDT Ventricular Rate:  65 PR Interval:  180 QRS Duration: 90 QT Interval:  386 QTC Calculation: 401 R Axis:   -23 Text Interpretation: Normal sinus rhythm Nonspecific ST and T wave abnormality Abnormal ECG When compared with ECG of 27-Apr-2021 23:17, PREVIOUS ECG IS PRESENT when compared to prior, similar appearance. No STEMI Confirmed by Antony Blackbird [phone]) on 02/28/2022 10:22:14 PM  Radiology CT Abdomen Pelvis W Contrast  Result Date: 02/28/2022 CLINICAL DATA:  Bowel obstruction suspected partial bowel obstruction EXAM: CT ABDOMEN AND PELVIS WITH CONTRAST TECHNIQUE: Multidetector CT imaging of the abdomen and pelvis was performed using the standard protocol following bolus administration of intravenous contrast. RADIATION DOSE REDUCTION: This exam was performed according to the departmental dose-optimization program which includes automated exposure control, adjustment of the mA and/or kV according to patient size and/or use of iterative reconstruction technique. CONTRAST:  67m OMNIPAQUE IOHEXOL 300 MG/ML  SOLN COMPARISON:  March 29, 2021 FINDINGS: Lower chest: Emphysematous changes. Hepatobiliary: Unremarkable appearance of the liver. Gallbladder is unremarkable. Mild prominence of the common bile duct, likely age-related in similar comparison to prior. Pancreas: Unremarkable. No pancreatic ductal dilatation or surrounding inflammatory changes. Spleen: Normal in size without focal abnormality. Adrenals/Urinary Tract: Adrenal glands are unremarkable. Kidneys enhance symmetrically. Scarring of the superior cortex of the LEFT kidney. No hydronephrosis. Subcentimeter hypodense lesions are too small to accurately characterize. Bladder is unremarkable. Stomach/Bowel: There is a knuckle of descending colon within a lateral hernia in the LEFT abdomen just above the iliac crest. Descending colon is relatively decompressed downstream from this. The colon upstream from this knuckle of bowel is fluid filled and prominent without frank dilation. Appendix is normal. No dilated loops of small bowel are seen. Vascular/Lymphatic: Severe atherosclerotic calcifications. Reproductive: Status post hysterectomy. No adnexal masses. Other: No free air.  Musculoskeletal: Status post posterior fixation with intervertebral spacer placement throughout the lumbar spine. Osteopenia. Sclerotic focus of the RIGHT femur likely reflects a bone island. IMPRESSION: There is a knuckle of descending colon within a lateral hernia in the LEFT abdomen just above the iliac crest. Descending colon is relatively decompressed downstream from this. The colon upstream from this knuckle of bowel is fluid filled and prominent without frank dilation. Findings could reflect early or partial colonic obstruction. Recommend correlation with physical exam. Electronically Signed   By: Valentino Saxon M.D.   On: 02/28/2022 18:07    Procedures Procedures    Medications Ordered in ED Medications  fentaNYL (SUBLIMAZE) injection 50 mcg (50 mcg Intravenous Given 02/28/22 2304)    ED Course/  Medical Decision Making/ A&P                           Medical Decision Making Amount and/or Complexity of Data Reviewed Radiology: ordered and independent interpretation performed. ECG/medicine tests: ordered and independent interpretation performed.  Risk Prescription drug management. Decision regarding hospitalization.   81 y.o. F here with abdominal pain x 4 days.  Had OP CT today which showed colonic obstruction, sent here for further evaluation.  She is afebrile, non-toxic.  Abdomen does appear distended on exam, generally tender.  Labs reassuring without leukocytosis or electrolyte derangement.  Lipase WNL.  UA without signs of infection.    Spoke with Dr. Thermon Leyland-- will evaluate in consult, try to reduce in the ED and plan for hernia repair tomorrow.  Will need medical admission.    Discussed with hospitalist, Dr. Posey Pronto-- will admit for ongoing care.  Final Clinical Impression(s) / ED Diagnoses Final diagnoses:  Colonic obstruction [facility])    Rx / DC Orders ED Discharge Orders     None         Terri Pickett, PA-C 02/28/22 2347    Tegeler, Gwenyth Allegra, MD 03/01/22 [phone]

## 2022-02-28 NOTE — Assessment & Plan Note (Addendum)
Partial colonic obstruction from recurrent left inguinal hernia. -Keep n.p.o. -Start IV fluid hydration overnight -IV analgesics and antiemetics as needed -Rest of management per general surgery

## 2022-02-28 NOTE — ED Notes (Signed)
X1 no response for triage vitals

## 2022-02-28 NOTE — ED Triage Notes (Signed)
Patient received a call from her gastroenterologist advised her to go to ER due to abnormal CT scan result taken today with colon hernia/obstruction , she adds intermittent pain across her abdomen with emesis for several days .

## 2022-02-28 NOTE — Assessment & Plan Note (Signed)
IV supplement ordered. 

## 2022-02-28 NOTE — Assessment & Plan Note (Signed)
Holding home Percocet while NPO.  Using IV Dilaudid as needed.

## 2022-02-28 NOTE — ED Provider Triage Note (Signed)
Emergency Medicine Provider Triage Evaluation Note  Terri Harris , a 81 y.o. female  was evaluated in triage.  Pt complains of abdominal pain, nausea vomiting.  Had a CT scan today which showed partial bowel obstruction and herniation.  GI told to go to ED  She then went to radiology department had the scan done.  Scan was called to [place] this evening approximately 7 PM showed partial colon herniation with partial obstruction.  Spoke with Dr. Elodia Florence for [facility] surgery.  It was advised for the patient to go to the emergency department for further evaluation.  Patient was spoken to she will go directly to emergency department at this point for further evaluation at [facility]   IMPRESSION: There is a knuckle of descending colon within a lateral hernia in the LEFT abdomen just above the iliac crest. Descending colon is relatively decompressed downstream from this. The colon upstream from this knuckle of bowel is fluid filled and prominent without frank dilation. Findings could reflect early or partial colonic obstruction. Recommend correlation with physical exam.    Review of Systems  PER HPI  Physical Exam  BP (!) 145/71   Pulse 64   Temp 97.9 F (36.6 C) (Oral)   Resp 20   LMP  (LMP Unknown)   SpO2 100%  Gen:   Awake, no distress   Resp:  Normal effort  MSK:   Moves extremities without difficulty  Other:  Generalization tenderness  Medical Decision Making  Medically screening exam initiated at 9:53 PM.  Appropriate orders placed.  [name] was informed that the remainder of the evaluation will be completed by another provider, this initial triage assessment does not replace that evaluation, and the importance of remaining in the ED until their evaluation is complete.     Sherrill Raring, PA-C 02/28/22 2158

## 2022-03-01 ENCOUNTER — Encounter (HOSPITAL_COMMUNITY): Payer: Self-pay | Admitting: Internal Medicine

## 2022-03-01 ENCOUNTER — Observation Stay (HOSPITAL_COMMUNITY): Payer: PPO

## 2022-03-01 DIAGNOSIS — K56609 Unspecified intestinal obstruction, unspecified as to partial versus complete obstruction: Secondary | ICD-10-CM | POA: Diagnosis not present

## 2022-03-01 LAB — BASIC METABOLIC PANEL
Anion gap: 8 (ref 5–15)
BUN: 6 mg/dL — ABNORMAL LOW (ref 8–23)
CO2: 27 mmol/L (ref 22–32)
Calcium: 9 mg/dL (ref 8.9–10.3)
Chloride: 104 mmol/L (ref 98–111)
Creatinine, Ser: 0.52 mg/dL (ref 0.44–1.00)
GFR, Estimated: >60 mL/min (ref >60)
Glucose, Bld: 86 mg/dL (ref 70–99)
Potassium: 4 mmol/L (ref 3.5–5.1)
Sodium: 139 mmol/L (ref 135–145)

## 2022-03-01 LAB — CBC
HCT: 37.6 % (ref 36.0–46.0)
Hemoglobin: 12.5 g/dL (ref 12.0–15.0)
MCH: 32.8 pg (ref 26.0–34.0)
MCHC: 33.2 g/dL (ref 30.0–36.0)
MCV: 98.7 fL (ref 80.0–100.0)
Platelets: 200 10*3/uL (ref 150–400)
RBC: 3.81 MIL/uL — ABNORMAL LOW (ref 3.87–5.11)
RDW: 12.5 % (ref 11.5–15.5)
WBC: 6.1 10*3/uL (ref 4.0–10.5)
nRBC: 0 % (ref 0.0–0.2)

## 2022-03-01 MED ORDER — DIATRIZOATE MEGLUMINE & SODIUM 66-10 % PO SOLN
90.0000 mL | Freq: Once | ORAL | Status: AC
  Administered 2022-03-01: 90 mL via ORAL
  Filled 2022-03-01: qty 90

## 2022-03-01 MED ORDER — INFLUENZA VAC A&B SA ADJ QUAD 0.5 ML IM PRSY: 0.5000 mL | PREFILLED_SYRINGE | INTRAMUSCULAR | Status: DC | PRN

## 2022-03-01 MED ORDER — LACTATED RINGERS IV SOLN: INTRAVENOUS | Status: AC

## 2022-03-01 NOTE — Progress Notes (Signed)
Subjective/Chief Complaint: Having some bowel function, some bloating, no n/v, no ab pain   Objective: Vital signs in last 24 hours: Temp:  [97.7 F (36.5 C)-98.1 F (36.7 C)] 98.1 F (36.7 C) (10/04 0854) Pulse Rate:  [56-71] 68 (10/04 0730) Resp:  [14-24] 20 (10/04 0730) BP: (97-145)/(51-74) 142/71 (10/04 0730) SpO2:  [90 %-100 %] 97 % (10/04 0730) Weight:  [53.5 kg] 53.5 kg (10/03 1352)    Intake/Output from previous day: 10/03 0701 - 10/04 0700 In: 294.6 [IV Piggyback:294.6] Out: -  Intake/Output this shift: No intake/output data recorded.  Ab midline and left groin incisions well healed, she has scarring in left groin that is mildly tender, this does not appear to be where ct abnormality is, otherwise abdomen is not tender at all  Lab Results:  Recent Labs    02/28/22 2201 03/01/22 0307  WBC 6.9 6.1  HGB 13.9 12.5  HCT 43.6 37.6  PLT 236 200   BMET Recent Labs    02/28/22 2201 03/01/22 0307  NA 138 139  K 3.4* 4.0  CL 101 104  CO2 28 27  GLUCOSE 118* 86  BUN 6* 6*  CREATININE 0.57 0.52  CALCIUM 9.8 9.0   PT/INR No results for input(s): "LABPROT", "INR" in the last 72 hours. ABG No results for input(s): "PHART", "HCO3" in the last 72 hours.  Invalid input(s): "PCO2", "PO2"  Studies/Results: CT Abdomen Pelvis W Contrast  Result Date: 02/28/2022 CLINICAL DATA:  Bowel obstruction suspected partial bowel obstruction EXAM: CT ABDOMEN AND PELVIS WITH CONTRAST TECHNIQUE: Multidetector CT imaging of the abdomen and pelvis was performed using the standard protocol following bolus administration of intravenous contrast. RADIATION DOSE REDUCTION: This exam was performed according to the departmental dose-optimization program which includes automated exposure control, adjustment of the mA and/or kV according to patient size and/or use of iterative reconstruction technique. CONTRAST:  21m OMNIPAQUE IOHEXOL 300 MG/ML  SOLN COMPARISON:  March 29, 2021  FINDINGS: Lower chest: Emphysematous changes. Hepatobiliary: Unremarkable appearance of the liver. Gallbladder is unremarkable. Mild prominence of the common bile duct, likely age-related in similar comparison to prior. Pancreas: Unremarkable. No pancreatic ductal dilatation or surrounding inflammatory changes. Spleen: Normal in size without focal abnormality. Adrenals/Urinary Tract: Adrenal glands are unremarkable. Kidneys enhance symmetrically. Scarring of the superior cortex of the LEFT kidney. No hydronephrosis. Subcentimeter hypodense lesions are too small to accurately characterize. Bladder is unremarkable. Stomach/Bowel: There is a knuckle of descending colon within a lateral hernia in the LEFT abdomen just above the iliac crest. Descending colon is relatively decompressed downstream from this. The colon upstream from this knuckle of bowel is fluid filled and prominent without frank dilation. Appendix is normal. No dilated loops of small bowel are seen. Vascular/Lymphatic: Severe atherosclerotic calcifications. Reproductive: Status post hysterectomy. No adnexal masses. Other: No free air. Musculoskeletal: Status post posterior fixation with intervertebral spacer placement throughout the lumbar spine. Osteopenia. Sclerotic focus of the RIGHT femur likely reflects a bone island. IMPRESSION: There is a knuckle of descending colon within a lateral hernia in the LEFT abdomen just above the iliac crest. Descending colon is relatively decompressed downstream from this. The colon upstream from this knuckle of bowel is fluid filled and prominent without frank dilation. Findings could reflect early or partial colonic obstruction. Recommend correlation with physical exam. Electronically Signed   By: SValentino SaxonM.D.   On: 02/28/2022 18:07    Anti-infectives: Anti-infectives (From admission, onward)    None       Assessment/Plan:  Ab pain -not sure what source is, she certainly has adhesive disease.  There is nothing urgent that makes me concerned she needs to go to OR. Not sure if this is eventration of that area or an actual hernia -we discussed surgery vs observation. I think observation and giving her some po contrast to follow is reasonable. Discussed if worse or not better will need to consider surgery  I reviewed hospitalist notes, last 24 h vitals and pain scores, last 24 h labs and trends, and last 24 h imaging results.  This care required straight-forward level of medical decision making.    Rolm Bookbinder 03/01/2022

## 2022-03-01 NOTE — ED Notes (Signed)
Received verbal report Crissie Figures RN at this time

## 2022-03-01 NOTE — ED Notes (Signed)
Care transferred, report given Marlynn Perking, RN.

## 2022-03-01 NOTE — Progress Notes (Signed)
PROGRESS NOTE  Terri Harris  YOV:[id number] DOB: 01-Dec-1940 DOA: 02/28/2022 PCP: Kathyrn Drown, MD   Brief Narrative:  Patient is a 81 year old female with history of COPD, hypertension, hyperlipidemia, GERD, SBO status post small bowel resection, left inguinal hernia who presented with abdominal pain, bloating, nausea, vomiting.  She saw her PCP for that on 10/3, CT abdomen/pelvis showed partial colonic obstruction due to left inguinal hernia.  General surgery consult.   Assessment & Plan:  Principal Problem:   Colonic obstruction (HCC) Active Problems:   COPD (chronic obstructive pulmonary disease) (HCC)   HTN (hypertension)   Hypokalemia   Hyperlipidemia   Chronic pain   Partial colonic obstruction: Secondary to recurrent inguinal hernia on the left. She has history of SBO status post exploratory laparotomy with small bowel resection on January, 2015.  Status post open left inguinal hernia repair on 09/12/2023. Presented with abdomen pain, nausea, vomiting.  CT imaging consistent of partial colonic obstruction secondary to recurrent left inguinal hernia. General surgery consulted here, started on IV fluids, pain management, antiemetics. She had 2 bowel movements this morning.  This morning her abdomen is soft, nontender, nondistended, no signs of hernia seen. General surgery following.  Diet advancement/surgical intervention as per surgery team  Hypertension: Takes amlodipine 5 mg daily.  Continue monitoring blood pressure  History of COPD: Currently not in exacerbation.  Continue albuterol as needed  Hypokalemia: Supplemented  Chronic pain: On Percocet at home.  History of hyperlipidemia: On Statin at home.         DVT prophylaxis:SCDs Start: 02/28/22 2350     Code Status: Full Code  Family Communication: Husband  at bedside  Patient status:Inpatient  Patient is from :Home  Anticipated discharge XA:JOIN  Estimated DC date:1-2 days   Consultants:  Surgery  Procedures:None  Antimicrobials:  Anti-infectives (From admission, onward)    None       Subjective: Patient seen and examined at the bedside today.  Hemodynamically stable, comfortable, lying in bed.  Had 2 bowel movements today.  Denies any abdomen pain   Objective: Vitals:   03/01/22 0311 03/01/22 0530 03/01/22 0600 03/01/22 0730  BP:  133/64 (!) 97/59 (!) 142/71  Pulse:  60 (!) 58 68  Resp:  '20 18 20  '$ Temp: 98 F (36.7 C)     TempSrc: Oral     SpO2:  95% 90% 97%    Intake/Output Summary (Last 24 hours) at 03/01/2022 0848 Last data filed at 03/01/2022 0444 Gross per 24 hour  Intake 294.59 ml  Output --  Net 294.59 ml   There were no vitals filed for this visit.  Examination:  General exam: Overall comfortable, not in distress, pleasant elderly female HEENT: PERRL Respiratory system:  no wheezes or crackles  Cardiovascular system: S1 & S2 heard, RRR.  Gastrointestinal system: Abdomen is nondistended, soft and nontender. Central nervous system: Alert and oriented Extremities: No edema, no clubbing ,no cyanosis Skin: No rashes, no ulcers,no icterus     Data Reviewed: I have personally reviewed following labs and imaging studies  CBC: Recent Labs  Lab 02/28/22 2201 03/01/22 0307  WBC 6.9 6.1  NEUTROABS 4.4  --   HGB 13.9 12.5  HCT 43.6 37.6  MCV 99.8 98.7  PLT 236 867   Basic Metabolic Panel: Recent Labs  Lab 02/28/22 1739 02/28/22 2201 03/01/22 0307  NA  --  138 139  K  --  3.4* 4.0  CL  --  101 104  CO2  --  28 27  GLUCOSE  --  118* 86  BUN  --  6* 6*  CREATININE 0.50 0.57 0.52  CALCIUM  --  9.8 9.0     No results found for this or any previous visit (from the past 240 hour(s)).   Radiology Studies: CT Abdomen Pelvis W Contrast  Result Date: 02/28/2022 CLINICAL DATA:  Bowel obstruction suspected partial bowel obstruction EXAM: CT ABDOMEN AND PELVIS WITH CONTRAST TECHNIQUE: Multidetector CT imaging of the abdomen and pelvis  was performed using the standard protocol following bolus administration of intravenous contrast. RADIATION DOSE REDUCTION: This exam was performed according to the departmental dose-optimization program which includes automated exposure control, adjustment of the mA and/or kV according to patient size and/or use of iterative reconstruction technique. CONTRAST:  57m OMNIPAQUE IOHEXOL 300 MG/ML  SOLN COMPARISON:  March 29, 2021 FINDINGS: Lower chest: Emphysematous changes. Hepatobiliary: Unremarkable appearance of the liver. Gallbladder is unremarkable. Mild prominence of the common bile duct, likely age-related in similar comparison to prior. Pancreas: Unremarkable. No pancreatic ductal dilatation or surrounding inflammatory changes. Spleen: Normal in size without focal abnormality. Adrenals/Urinary Tract: Adrenal glands are unremarkable. Kidneys enhance symmetrically. Scarring of the superior cortex of the LEFT kidney. No hydronephrosis. Subcentimeter hypodense lesions are too small to accurately characterize. Bladder is unremarkable. Stomach/Bowel: There is a knuckle of descending colon within a lateral hernia in the LEFT abdomen just above the iliac crest. Descending colon is relatively decompressed downstream from this. The colon upstream from this knuckle of bowel is fluid filled and prominent without frank dilation. Appendix is normal. No dilated loops of small bowel are seen. Vascular/Lymphatic: Severe atherosclerotic calcifications. Reproductive: Status post hysterectomy. No adnexal masses. Other: No free air. Musculoskeletal: Status post posterior fixation with intervertebral spacer placement throughout the lumbar spine. Osteopenia. Sclerotic focus of the RIGHT femur likely reflects a bone island. IMPRESSION: There is a knuckle of descending colon within a lateral hernia in the LEFT abdomen just above the iliac crest. Descending colon is relatively decompressed downstream from this. The colon upstream  from this knuckle of bowel is fluid filled and prominent without frank dilation. Findings could reflect early or partial colonic obstruction. Recommend correlation with physical exam. Electronically Signed   By: SValentino SaxonM.D.   On: 02/28/2022 18:07    Scheduled Meds: Continuous Infusions:  lactated ringers 100 mL/hr at 03/01/22 0015     LOS: 1 day   AShelly Coss MD [facility] P10/08/2021, 8:48 AM

## 2022-03-01 NOTE — ED Notes (Signed)
Pt ambulated to restroom with assistance.  

## 2022-03-02 DIAGNOSIS — K56609 Unspecified intestinal obstruction, unspecified as to partial versus complete obstruction: Secondary | ICD-10-CM | POA: Diagnosis not present

## 2022-03-02 MED ORDER — ADULT MULTIVITAMIN W/MINERALS CH
1.0000 | ORAL_TABLET | Freq: Every day | ORAL | Status: DC
  Administered 2022-03-02 – 2022-03-03 (×2): 1 via ORAL
  Filled 2022-03-02 (×2): qty 1

## 2022-03-02 MED ORDER — ENSURE ENLIVE PO LIQD
237.0000 mL | Freq: Two times a day (BID) | ORAL | Status: DC
  Administered 2022-03-02 – 2022-03-03 (×3): 237 mL via ORAL

## 2022-03-02 MED ORDER — SODIUM CHLORIDE 0.9 % IV SOLN: INTRAVENOUS | Status: DC

## 2022-03-02 NOTE — Progress Notes (Addendum)
Subjective: CC: Doing well. Had pain yesterday requiring IV pain medication (last at 846 am). Recently all of her abdominal pain has resolved. She currently has no abdominal pain. Denies n/v. Passing flatus. Had 2 liquid bm's overnight.   Objective: Vital signs in last 24 hours: Temp:  [97.7 F (36.5 C)-98.8 F (37.1 C)] 98.1 F (36.7 C) (10/05 0723) Pulse Rate:  [57-72] 72 (10/05 0723) Resp:  [14-18] 14 (10/05 0723) BP: (116-142)/(60-71) 123/61 (10/05 0723) SpO2:  [93 %-95 %] 95 % (10/05 0723) Weight:  [56 kg] 56 kg (10/04 1310) Last BM Date : 03/01/22  Intake/Output from previous day: 10/04 0701 - 10/05 0700 In: 1028.8 [I.V.:1028.8] Out: -  Intake/Output this shift: No intake/output data recorded.  PE: Gen:  Alert, NAD, pleasant Abd: Midline and left groin incisions well healed. She is ND, NT to palpation including over the prior left groin incision where she had mild ttp yesterday per MDs note. +BS  Lab Results:  Recent Labs    02/28/22 2201 03/01/22 0307  WBC 6.9 6.1  HGB 13.9 12.5  HCT 43.6 37.6  PLT 236 200   BMET Recent Labs    02/28/22 2201 03/01/22 0307  NA 138 139  K 3.4* 4.0  CL 101 104  CO2 28 27  GLUCOSE 118* 86  BUN 6* 6*  CREATININE 0.57 0.52  CALCIUM 9.8 9.0   PT/INR No results for input(s): "LABPROT", "INR" in the last 72 hours. CMP     Component Value Date/Time   NA 139 03/01/2022 0307   NA 140 05/05/2021 0842   K 4.0 03/01/2022 0307   CL 104 03/01/2022 0307   CO2 27 03/01/2022 0307   GLUCOSE 86 03/01/2022 0307   BUN 6 (L) 03/01/2022 0307   BUN 10 05/05/2021 0842   CREATININE 0.52 03/01/2022 0307   CALCIUM 9.0 03/01/2022 0307   PROT 8.1 02/28/2022 2201   PROT 8.1 12/12/2021 0909   ALBUMIN 4.5 02/28/2022 2201   ALBUMIN 5.0 (H) 12/12/2021 0909   AST 38 02/28/2022 2201   ALT 23 02/28/2022 2201   ALKPHOS 78 02/28/2022 2201   BILITOT 0.3 02/28/2022 2201   BILITOT 0.4 12/12/2021 0909   GFRNONAA >60 03/01/2022 0307    GFRAA 103 03/12/2020 0833   Lipase     Component Value Date/Time   LIPASE 36 02/28/2022 2201    Studies/Results: DG Abd Portable 1V-Small Bowel Obstruction Protocol-initial, 8 hr delay  Result Date: 03/01/2022 CLINICAL DATA:  Small bowel obstruction EXAM: PORTABLE ABDOMEN - 1 VIEW COMPARISON:  CT abdomen/pelvis dated 02/28/2022 FINDINGS: Nonobstructive bowel gas pattern. Contrast in the left colon, from the splenic flexure to the sigmoid colon. Lumbar spine fixation hardware. IMPRESSION: Nonobstructive bowel gas pattern. Contrast in the left colon, extending to the sigmoid. Electronically Signed   By: Julian Hy M.D.   On: 03/01/2022 20:08   CT Abdomen Pelvis W Contrast  Result Date: 02/28/2022 CLINICAL DATA:  Bowel obstruction suspected partial bowel obstruction EXAM: CT ABDOMEN AND PELVIS WITH CONTRAST TECHNIQUE: Multidetector CT imaging of the abdomen and pelvis was performed using the standard protocol following bolus administration of intravenous contrast. RADIATION DOSE REDUCTION: This exam was performed according to the departmental dose-optimization program which includes automated exposure control, adjustment of the mA and/or kV according to patient size and/or use of iterative reconstruction technique. CONTRAST:  52m OMNIPAQUE IOHEXOL 300 MG/ML  SOLN COMPARISON:  March 29, 2021 FINDINGS: Lower chest: Emphysematous changes. Hepatobiliary: Unremarkable appearance of the  liver. Gallbladder is unremarkable. Mild prominence of the common bile duct, likely age-related in similar comparison to prior. Pancreas: Unremarkable. No pancreatic ductal dilatation or surrounding inflammatory changes. Spleen: Normal in size without focal abnormality. Adrenals/Urinary Tract: Adrenal glands are unremarkable. Kidneys enhance symmetrically. Scarring of the superior cortex of the LEFT kidney. No hydronephrosis. Subcentimeter hypodense lesions are too small to accurately characterize. Bladder is  unremarkable. Stomach/Bowel: There is a knuckle of descending colon within a lateral hernia in the LEFT abdomen just above the iliac crest. Descending colon is relatively decompressed downstream from this. The colon upstream from this knuckle of bowel is fluid filled and prominent without frank dilation. Appendix is normal. No dilated loops of small bowel are seen. Vascular/Lymphatic: Severe atherosclerotic calcifications. Reproductive: Status post hysterectomy. No adnexal masses. Other: No free air. Musculoskeletal: Status post posterior fixation with intervertebral spacer placement throughout the lumbar spine. Osteopenia. Sclerotic focus of the RIGHT femur likely reflects a bone island. IMPRESSION: There is a knuckle of descending colon within a lateral hernia in the LEFT abdomen just above the iliac crest. Descending colon is relatively decompressed downstream from this. The colon upstream from this knuckle of bowel is fluid filled and prominent without frank dilation. Findings could reflect early or partial colonic obstruction. Recommend correlation with physical exam. Electronically Signed   By: Valentino Saxon M.D.   On: 02/28/2022 18:07    Anti-infectives: Anti-infectives (From admission, onward)    None        Assessment/Plan Ab pain - CT questions knuckle of descending colon within a lateral hernia in the LEFT abdomen with possible obstruction. Contrast has made it to the colon on xray and she is now having bowel function. She is completely NT on exam and feels symptomatically improved. Her vitals are reassuring without fever, tachycardia or hypotension. WBC wnl. I do not think she needs anything emergent done currently. Reasonable to start cld and see how she tolerates. If she was to worsen or fail to improve, we may need to consider surgery.    LOS: 1 day    Jillyn Ledger , [facility] 03/02/2022, 10:31 AM Please see Amion for pager number during day hours  7:00am-4:30pm

## 2022-03-02 NOTE — Progress Notes (Signed)
PROGRESS NOTE  Terri Harris  QJJ:[id number] DOB: [birth year] DOA: 02/28/2022 PCP: Kathyrn Drown, MD   Brief Narrative:  Patient is a 81 year old female with history of COPD, hypertension, hyperlipidemia, GERD, SBO status post small bowel resection, left inguinal hernia who presented with abdominal pain, bloating, nausea, vomiting.  She saw her PCP for that on 10/3, CT abdomen/pelvis showed partial colonic obstruction due to left inguinal hernia.  General surgery consulted.  Looks like the obstruction has Cheryll Cockayne is having  bowel movements.  Started on liquid diet   Assessment & Plan:  Principal Problem:   Colonic obstruction ([place]) Active Problems:   COPD (chronic obstructive pulmonary disease) (HCC)   HTN (hypertension)   Hypokalemia   Hyperlipidemia   Chronic pain   Partial colonic obstruction: Secondary to recurrent inguinal hernia on the left. She has history of SBO status post exploratory laparotomy with small bowel resection on January, 2015.  Status post open left inguinal hernia repair on 09/12/2023. Presented with abdomen pain, nausea, vomiting.  CT imaging consistent of partial colonic obstruction secondary to recurrent left inguinal hernia. General surgery consulted here, started on IV fluids, pain management, antiemetics.  This morning her abdomen remains soft, nontender, nondistended, no signs of hernia seen. General surgery following.  She had bowel movement last night.  Started on full liquid diet  Hypertension: Takes amlodipine 5 mg daily.  Continue monitoring blood pressure  History of COPD: Currently not in exacerbation.  Continue albuterol as needed  Hypokalemia: Supplemented  Chronic pain: On Percocet at home.  History of hyperlipidemia: On Statin at home.         DVT prophylaxis:SCDs Start: 02/28/22 2350     Code Status: Full Code  Family Communication: Husband  at bedside on 03/01/22  Patient status:Inpatient  Patient is from  :Home  Anticipated discharge GY:JEHU  Estimated DC date:1-2 days,needs surgical clearance   Consultants: Surgery  Procedures:None  Antimicrobials:  Anti-infectives (From admission, onward)    None       Subjective: Patient seen and examined at the bedside today.  Hemodynamically stable.  Denies any abdomen pain, nausea or vomiting.  Had bowel movement last night.  Objective: Vitals:   03/01/22 1918 03/01/22 2304 03/02/22 0328 03/02/22 0723  BP: 129/65 121/62 116/60 123/61  Pulse: 65 (!) 59 63 72  Resp:    14  Temp: 97.7 F (36.5 C) 98.2 F (36.8 C) 97.8 F (36.6 C) 98.1 F (36.7 C)  TempSrc: Oral Oral Oral Oral  SpO2: 95% 93% 94% 95%  Weight:      Height:        Intake/Output Summary (Last 24 hours) at 03/02/2022 1113 Last data filed at 03/01/2022 1539 Gross per 24 hour  Intake 1028.83 ml  Output --  Net 1028.83 ml   Filed Weights   03/01/22 1310  Weight: 56 kg    Examination:  General exam: Overall comfortable, not in distress HEENT: PERRL Respiratory system:  no wheezes or crackles  Cardiovascular system: S1 & S2 heard, RRR.  Gastrointestinal system: Abdomen is nondistended, soft and nontender. Central nervous system: Alert and oriented Extremities: No edema, no clubbing ,no cyanosis Skin: No rashes, no ulcers,no icterus     Data Reviewed: I have personally reviewed following labs and imaging studies  CBC: Recent Labs  Lab 02/28/22 2201 03/01/22 0307  WBC 6.9 6.1  NEUTROABS 4.4  --   HGB 13.9 12.5  HCT 43.6 37.6  MCV 99.8 98.7  PLT 236 200   Basic  Metabolic Panel: Recent Labs  Lab 02/28/22 1739 02/28/22 2201 03/01/22 0307  NA  --  138 139  K  --  3.4* 4.0  CL  --  101 104  CO2  --  28 27  GLUCOSE  --  118* 86  BUN  --  6* 6*  CREATININE 0.50 0.57 0.52  CALCIUM  --  9.8 9.0     No results found for this or any previous visit (from the past 240 hour(s)).   Radiology Studies: DG Abd Portable 1V-Small Bowel Obstruction  Protocol-initial, 8 hr delay  Result Date: 03/01/2022 CLINICAL DATA:  Small bowel obstruction EXAM: PORTABLE ABDOMEN - 1 VIEW COMPARISON:  CT abdomen/pelvis dated 02/28/2022 FINDINGS: Nonobstructive bowel gas pattern. Contrast in the left colon, from the splenic flexure to the sigmoid colon. Lumbar spine fixation hardware. IMPRESSION: Nonobstructive bowel gas pattern. Contrast in the left colon, extending to the sigmoid. Electronically Signed   By: Julian Hy M.D.   On: 03/01/2022 20:08   CT Abdomen Pelvis W Contrast  Result Date: 02/28/2022 CLINICAL DATA:  Bowel obstruction suspected partial bowel obstruction EXAM: CT ABDOMEN AND PELVIS WITH CONTRAST TECHNIQUE: Multidetector CT imaging of the abdomen and pelvis was performed using the standard protocol following bolus administration of intravenous contrast. RADIATION DOSE REDUCTION: This exam was performed according to the departmental dose-optimization program which includes automated exposure control, adjustment of the mA and/or kV according to patient size and/or use of iterative reconstruction technique. CONTRAST:  76m OMNIPAQUE IOHEXOL 300 MG/ML  SOLN COMPARISON:  March 29, 2021 FINDINGS: Lower chest: Emphysematous changes. Hepatobiliary: Unremarkable appearance of the liver. Gallbladder is unremarkable. Mild prominence of the common bile duct, likely age-related in similar comparison to prior. Pancreas: Unremarkable. No pancreatic ductal dilatation or surrounding inflammatory changes. Spleen: Normal in size without focal abnormality. Adrenals/Urinary Tract: Adrenal glands are unremarkable. Kidneys enhance symmetrically. Scarring of the superior cortex of the LEFT kidney. No hydronephrosis. Subcentimeter hypodense lesions are too small to accurately characterize. Bladder is unremarkable. Stomach/Bowel: There is a knuckle of descending colon within a lateral hernia in the LEFT abdomen just above the iliac crest. Descending colon is relatively  decompressed downstream from this. The colon upstream from this knuckle of bowel is fluid filled and prominent without frank dilation. Appendix is normal. No dilated loops of small bowel are seen. Vascular/Lymphatic: Severe atherosclerotic calcifications. Reproductive: Status post hysterectomy. No adnexal masses. Other: No free air. Musculoskeletal: Status post posterior fixation with intervertebral spacer placement throughout the lumbar spine. Osteopenia. Sclerotic focus of the RIGHT femur likely reflects a bone island. IMPRESSION: There is a knuckle of descending colon within a lateral hernia in the LEFT abdomen just above the iliac crest. Descending colon is relatively decompressed downstream from this. The colon upstream from this knuckle of bowel is fluid filled and prominent without frank dilation. Findings could reflect early or partial colonic obstruction. Recommend correlation with physical exam. Electronically Signed   By: SValentino SaxonM.D.   On: 02/28/2022 18:07    Scheduled Meds: Continuous Infusions:  sodium chloride 75 mL/hr at 03/02/22 0842     LOS: 1 day   AShelly Coss MD [facility] P10/09/2021, 11:13 AM

## 2022-03-02 NOTE — Plan of Care (Signed)

## 2022-03-02 NOTE — Progress Notes (Signed)
Initial Nutrition Assessment  DOCUMENTATION CODES:   Non-severe (moderate) malnutrition in context of chronic illness  INTERVENTION:  Ensure Enlive po BID, each supplement provides 350 kcal and 20 grams of protein. Magic cup TID with meals, each supplement provides 290 kcal and 9 grams of protein MVI with minerals daily "High Calorie, High Protein Nutrition Therapy" handout added to AVS  NUTRITION DIAGNOSIS:   Moderate Malnutrition related to chronic illness (recurrent SBO) as evidenced by moderate fat depletion, severe muscle depletion, moderate muscle depletion, energy intake < or equal to 75% for > or equal to 1 month.  GOAL:   Patient will meet greater than or equal to 90% of their needs  MONITOR:   PO intake, Supplement acceptance, Diet advancement, Labs, Weight trends  REASON FOR ASSESSMENT:   Malnutrition Screening Tool    ASSESSMENT:   Pt admitted with partial colonic bowel obstruction d/t L inguinal hernia. PMH significant for COPD, HTN, HLD, GERD, chronic pain, h/o SBO s/p small bowel resection (2015)   Surgery following. Pt remains on observation for now and if condition worsens, may consider surgery. Advancing to liquid diet.  Spoke with pt at bedside. She reports that she had COVID in October 2022 and was hospitalized for an obstruction in November and December. Since having COVID she has had a significant loss of appetite in addition to loss of taste. She reports eating very minimally and recalls having maybe half to a whole vegetable frozen meal or a small snack. She c/o early satiety when eating or drinking beverages. She has added in Boost BID but states that she often can't finish one d/t fullness.   Pt reports that Thursday and Friday, she did not eat anything. She had something small Saturday and Sunday and her last PO intake was Monday. She does endorse feeling hungry.    Discussed with her ways to help enhance flavor of foods. Encouraged her to eat smaller  more frequent calorie/protein dense foods. Encouraged continued use of nutrition supplements. Provided ways to increase her nutritional intake and added handout with additional ideas to AVS, pt aware.   Per review of weight history, pt's weight had remained stable at 50 kg. More recently, her weight appears to have been increasing. Current weight noted to be 56 kg. Will continue to monitor throughout admission.   Medications reviewed and include IV NaCl @ 24m/hr  Labs: reviewed  NUTRITION - FOCUSED PHYSICAL EXAM:  Flowsheet Row Most Recent Value  Orbital Region Moderate depletion  Upper Arm Region Moderate depletion  Thoracic and Lumbar Region Mild depletion  Buccal Region Moderate depletion  Temple Region Moderate depletion  Clavicle Bone Region Moderate depletion  Clavicle and Acromion Bone Region Moderate depletion  Scapular Bone Region Moderate depletion  Dorsal Hand Moderate depletion  Patellar Region Severe depletion  Anterior Thigh Region Severe depletion  Posterior Calf Region Moderate depletion  Edema (RD Assessment) None  Hair Reviewed  Eyes Reviewed  Mouth Reviewed  Skin Reviewed  Nails Reviewed       Diet Order:   Diet Order             Diet full liquid Room service appropriate? Yes; Fluid consistency: Thin  Diet effective now                   EDUCATION NEEDS:   Education needs have been addressed  Skin:  Skin Assessment: Reviewed RN Assessment  Last BM:  10/4  Height:   Ht Readings from Last 1 Encounters:  03/01/22 '5\' 5"'$  (1.651 m)    Weight:   Wt Readings from Last 1 Encounters:  03/01/22 56 kg   BMI:  Body mass index is 20.54 kg/m.  Estimated Nutritional Needs:   Kcal:  1550-1750  Protein:  80-95g  Fluid:  >/=1.5L  Terri Harris, RDN, LDN Clinical Nutrition

## 2022-03-02 NOTE — Discharge Instructions (Signed)

## 2022-03-03 DIAGNOSIS — K56609 Unspecified intestinal obstruction, unspecified as to partial versus complete obstruction: Secondary | ICD-10-CM | POA: Diagnosis not present

## 2022-03-03 LAB — BASIC METABOLIC PANEL
Anion gap: 9 (ref 5–15)
BUN: 9 mg/dL (ref 8–23)
CO2: 25 mmol/L (ref 22–32)
Calcium: 8.9 mg/dL (ref 8.9–10.3)
Chloride: 105 mmol/L (ref 98–111)
Creatinine, Ser: 0.46 mg/dL (ref 0.44–1.00)
GFR, Estimated: >60 mL/min (ref >60)
Glucose, Bld: 82 mg/dL (ref 70–99)
Potassium: 3.4 mmol/L — ABNORMAL LOW (ref 3.5–5.1)
Sodium: 139 mmol/L (ref 135–145)

## 2022-03-03 MED ORDER — POTASSIUM CHLORIDE CRYS ER 20 MEQ PO TBCR
40.0000 meq | EXTENDED_RELEASE_TABLET | Freq: Once | ORAL | Status: AC
  Administered 2022-03-03: 40 meq via ORAL
  Filled 2022-03-03: qty 2

## 2022-03-03 MED ORDER — POTASSIUM CHLORIDE CRYS ER 20 MEQ PO TBCR: 40.0000 meq | EXTENDED_RELEASE_TABLET | Freq: Every day | ORAL | 0 refills | Status: DC

## 2022-03-03 NOTE — Discharge Summary (Signed)
Physician Discharge Summary  Terri Harris TFT:[id number] DOB: 04-Jun-1940 DOA: 02/28/2022  PCP: Kathyrn Drown, MD  Admit date: 02/28/2022 Discharge date: 03/03/2022  Admitted From: Home Disposition:  Home  Discharge Condition:Stable CODE STATUS:FULL Diet recommendation: Heart Healthy  Brief/Interim Summary:  Patient is a 81 year old female with history of COPD, hypertension, hyperlipidemia, GERD, SBO status post small bowel resection, left inguinal hernia who presented with abdominal pain, bloating, nausea, vomiting.  She saw her PCP for that on 10/3, CT abdomen/pelvis showed partial colonic obstruction due to left inguinal hernia.  General surgery consulted.  Looks like the obstruction has Cheryll Cockayne is having  bowel movements.  General surgery cleared for discharge home recommended outpatient follow-up. Medically stable for discharge home today.  Following problems were addressed during her hospitalization:  Partial colonic obstruction: Secondary to recurrent inguinal hernia on the left. She has history of SBO status post exploratory laparotomy with small bowel resection on January, 2015.  Status post open left inguinal hernia repair on 09/12/2023. Presented with abdomen pain, nausea, vomiting.  CT imaging consistent of partial colonic obstruction secondary to recurrent left inguinal hernia. General surgery consulted here, started on IV fluids, pain management, antiemetics. This morning her abdomen remains soft, nontender, nondistended, no signs of hernia seen. General surgery following.  She has been having  bowel movements.  General surgery cleared for discharge and recommended outpatient follow-up   Hypertension: Takes amlodipine 5 mg daily.     History of COPD: Currently not in exacerbation.   Hypokalemia: Supplemented   Chronic pain: On Percocet at home.   History of hyperlipidemia: On Statin at home.   Discharge Diagnoses:  Principal Problem:   Colonic obstruction  ([place]) Active Problems:   COPD (chronic obstructive pulmonary disease) (HCC)   HTN (hypertension)   Hypokalemia   Hyperlipidemia   Chronic pain    Discharge Instructions  Discharge Instructions     Diet - low sodium heart healthy   Complete by: As directed    Discharge instructions   Complete by: As directed    1)Please follow up with general surgery as an outpatient   Increase activity slowly   Complete by: As directed       Allergies as of 03/03/2022       Reactions   Codeine Nausea And Vomiting   Fosamax [alendronate Sodium] Other (See Comments)   gastritis        Medication List     TAKE these medications    acetaminophen 325 MG tablet Commonly known as: TYLENOL Take 2 tablets (650 mg total) by mouth every 6 (six) hours as needed for mild pain (or Fever >/= 101).   albuterol 108 (90 Base) MCG/ACT inhaler Commonly known as: VENTOLIN HFA Inhale 2 puffs into the lungs every 6 (six) hours as needed for wheezing or shortness of breath.   alum & mag hydroxide-simeth 200-200-20 MG/5ML suspension Commonly known as: MAALOX/MYLANTA Take 15 mLs by mouth every 6 (six) hours as needed for indigestion or heartburn.   amLODipine 5 MG tablet Commonly known as: NORVASC Take 1 tablet (5 mg total) by mouth daily.   cyanocobalamin 1000 MCG tablet Take 1,000 mcg by mouth daily.   oxyCODONE-acetaminophen 5-325 MG tablet Commonly known as: PERCOCET/ROXICET 1 pill taken 3 or 4 times daily as needed for pain to last 30 days What changed:  how much to take how to take this when to take this additional instructions   pantoprazole 40 MG tablet Commonly known as: PROTONIX TAKE 1 TABLET  BY MOUTH EVERY DAY   polyethylene glycol 17 g packet Commonly known as: MIRALAX / GLYCOLAX Take 17 g by mouth daily.   potassium chloride SA 20 MEQ tablet Commonly known as: KLOR-CON M Take 2 tablets (40 mEq total) by mouth daily. Start taking on: March 04, 2022   rosuvastatin 10  MG tablet Commonly known as: Crestor Take 1 tablet (10 mg total) by mouth daily.        Follow-up Information     Luking, Elayne Snare, MD. Schedule an appointment as soon as possible for a visit.   Specialty: Family Medicine Contact information: [place] [address] [phone]                Allergies  Allergen Reactions   Codeine Nausea And Vomiting   Fosamax [Alendronate Sodium] Other (See Comments)    gastritis    Consultations: Surgery   Procedures/Studies: DG Abd Portable 1V-Small Bowel Obstruction Protocol-initial, 8 hr delay  Result Date: 03/01/2022 CLINICAL DATA:  Small bowel obstruction EXAM: PORTABLE ABDOMEN - 1 VIEW COMPARISON:  CT abdomen/pelvis dated 02/28/2022 FINDINGS: Nonobstructive bowel gas pattern. Contrast in the left colon, from the splenic flexure to the sigmoid colon. Lumbar spine fixation hardware. IMPRESSION: Nonobstructive bowel gas pattern. Contrast in the left colon, extending to the sigmoid. Electronically Signed   By: Julian Hy M.D.   On: 03/01/2022 20:08   CT Abdomen Pelvis W Contrast  Result Date: 02/28/2022 CLINICAL DATA:  Bowel obstruction suspected partial bowel obstruction EXAM: CT ABDOMEN AND PELVIS WITH CONTRAST TECHNIQUE: Multidetector CT imaging of the abdomen and pelvis was performed using the standard protocol following bolus administration of intravenous contrast. RADIATION DOSE REDUCTION: This exam was performed according to the departmental dose-optimization program which includes automated exposure control, adjustment of the mA and/or kV according to patient size and/or use of iterative reconstruction technique. CONTRAST:  67m OMNIPAQUE IOHEXOL 300 MG/ML  SOLN COMPARISON:  March 29, 2021 FINDINGS: Lower chest: Emphysematous changes. Hepatobiliary: Unremarkable appearance of the liver. Gallbladder is unremarkable. Mild prominence of the common bile duct, likely age-related in similar comparison to  prior. Pancreas: Unremarkable. No pancreatic ductal dilatation or surrounding inflammatory changes. Spleen: Normal in size without focal abnormality. Adrenals/Urinary Tract: Adrenal glands are unremarkable. Kidneys enhance symmetrically. Scarring of the superior cortex of the LEFT kidney. No hydronephrosis. Subcentimeter hypodense lesions are too small to accurately characterize. Bladder is unremarkable. Stomach/Bowel: There is a knuckle of descending colon within a lateral hernia in the LEFT abdomen just above the iliac crest. Descending colon is relatively decompressed downstream from this. The colon upstream from this knuckle of bowel is fluid filled and prominent without frank dilation. Appendix is normal. No dilated loops of small bowel are seen. Vascular/Lymphatic: Severe atherosclerotic calcifications. Reproductive: Status post hysterectomy. No adnexal masses. Other: No free air. Musculoskeletal: Status post posterior fixation with intervertebral spacer placement throughout the lumbar spine. Osteopenia. Sclerotic focus of the RIGHT femur likely reflects a bone island. IMPRESSION: There is a knuckle of descending colon within a lateral hernia in the LEFT abdomen just above the iliac crest. Descending colon is relatively decompressed downstream from this. The colon upstream from this knuckle of bowel is fluid filled and prominent without frank dilation. Findings could reflect early or partial colonic obstruction. Recommend correlation with physical exam. Electronically Signed   By: SValentino SaxonM.D.   On: 02/28/2022 18:07      Subjective: Patient seen and examined at the bedside today.  Hemodynamically stable.  Denies abdomen pain, nausea or vomiting.  Had some loose bowel movements yesterday which stopped  overnight.  Medically stable for discharge.  I called the husband and discussed about discharge planning  Discharge Exam: Vitals:   03/03/22 0516 03/03/22 0733  BP: 121/68 123/64  Pulse: 63  63  Resp: 18 18  Temp: 98.2 F (36.8 C) 97.8 F (36.6 C)  SpO2: 96% 95%   Vitals:   03/02/22 1545 03/02/22 2015 03/03/22 0516 03/03/22 0733  BP: (!) 126/56 128/74 121/68 123/64  Pulse: 72 64 63 63  Resp: '15 19 18 18  '$ Temp: 98.1 F (36.7 C) 98.7 F (37.1 C) 98.2 F (36.8 C) 97.8 F (36.6 C)  TempSrc: Oral Oral  Oral  SpO2: 96% 93% 96% 95%  Weight:      Height:        General: Pt is alert, awake, not in acute distress Cardiovascular: RRR, S1/S2 +, no rubs, no gallops Respiratory: CTA bilaterally, no wheezing, no rhonchi Abdominal: Soft, NT, ND, bowel sounds + Extremities: no edema, no cyanosis    The results of significant diagnostics from this hospitalization (including imaging, microbiology, ancillary and laboratory) are listed below for reference.     Microbiology: No results found for this or any previous visit (from the past 240 hour(s)).   Labs: BNP (last 3 results) No results for input(s): "BNP" in the last 8760 hours. Basic Metabolic Panel: Recent Labs  Lab 02/28/22 1739 02/28/22 2201 03/01/22 0307 03/03/22 0449  NA  --  138 139 139  K  --  3.4* 4.0 3.4*  CL  --  101 104 105  CO2  --  '28 27 25  '$ GLUCOSE  --  118* 86 82  BUN  --  6* 6* 9  CREATININE 0.50 0.57 0.52 0.46  CALCIUM  --  9.8 9.0 8.9   Liver Function Tests: Recent Labs  Lab 02/28/22 2201  AST 38  ALT 23  ALKPHOS 78  BILITOT 0.3  PROT 8.1  ALBUMIN 4.5   Recent Labs  Lab 02/28/22 2201  LIPASE 36   No results for input(s): "AMMONIA" in the last 168 hours. CBC: Recent Labs  Lab 02/28/22 2201 03/01/22 0307  WBC 6.9 6.1  NEUTROABS 4.4  --   HGB 13.9 12.5  HCT 43.6 37.6  MCV 99.8 98.7  PLT 236 200   Cardiac Enzymes: No results for input(s): "CKTOTAL", "CKMB", "CKMBINDEX", "TROPONINI" in the last 168 hours. BNP: Invalid input(s): "POCBNP" CBG: No results for input(s): "GLUCAP" in the last 168 hours. D-Dimer No results for input(s): "DDIMER" in the last 72 hours. Hgb  A1c No results for input(s): "HGBA1C" in the last 72 hours. Lipid Profile No results for input(s): "CHOL", "HDL", "LDLCALC", "TRIG", "CHOLHDL", "LDLDIRECT" in the last 72 hours. Thyroid function studies No results for input(s): "TSH", "T4TOTAL", "T3FREE", "THYROIDAB" in the last 72 hours.  Invalid input(s): "FREET3" Anemia work up No results for input(s): "VITAMINB12", "FOLATE", "FERRITIN", "TIBC", "IRON", "RETICCTPCT" in the last 72 hours. Urinalysis    Component Value Date/Time   COLORURINE YELLOW 02/28/2022 2154   APPEARANCEUR CLEAR 02/28/2022 2154   LABSPEC >1.046 (H) 02/28/2022 2154   PHURINE 6.0 02/28/2022 2154   GLUCOSEU NEGATIVE 02/28/2022 2154   HGBUR NEGATIVE 02/28/2022 2154   BILIRUBINUR NEGATIVE 02/28/2022 2154   [place] NEGATIVE 02/28/2022 2154   PROTEINUR NEGATIVE 02/28/2022 2154   UROBILINOGEN 0.2 12/28/2014 0333   NITRITE NEGATIVE 02/28/2022 2154   LEUKOCYTESUR TRACE (A) 02/28/2022 2154   Sepsis Labs  Recent Labs  Lab 02/28/22 2201 03/01/22 0307  WBC 6.9 6.1   Microbiology No results found for this or any previous visit (from the past 240 hour(s)).  Please note: You were cared for by a hospitalist during your hospital stay. Once you are discharged, your primary care physician will handle any further medical issues. Please note that NO REFILLS for any discharge medications will be authorized once you are discharged, as it is imperative that you return to your primary care physician (or establish a relationship with a primary care physician if you do not have one) for your post hospital discharge needs so that they can reassess your need for medications and monitor your lab values.    Time coordinating discharge: 40 minutes  SIGNED:   Shelly Coss, MD  [facility] 03/03/2022, 10:35 AM Pager [phone]  If 7PM-7AM, please contact night-coverage [email or web address] Password [email or web address]

## 2022-03-03 NOTE — TOC Transition Note (Signed)
Transition of Care [facility]) - CM/SW Discharge Note   Patient Details  Name: Terri Harris MRN: [id number] Date of Birth: Mar 14, 1941  Transition of Care [facility]) CM/SW Contact:  Curlene Labrum, RN Phone Number: 03/03/2022, 10:43 AM   Clinical Narrative:      Transition of Care [facility]) Screening Note   Patient Details  Name: Terri Harris Date of Birth: 02-Apr-1941   Transition of Care [facility]) CM/SW Contact:    Curlene Labrum, RN Phone Number: 03/03/2022, 10:43 AM    Transition of Care Department [facility]) has reviewed patient and no TOC needs have been identified at this time. We will continue to monitor patient advancement through interdisciplinary progression rounds. If new patient transition needs arise, please place a TOC consult.  Patient is discharging home today with family  Discharge medications have been called in to the patient's home pharmacy.          Patient Goals and CMS Choice        Discharge Placement                       Discharge Plan and Services                                     Social Determinants of Health (SDOH) Interventions     Readmission Risk Interventions     No data to display

## 2022-03-03 NOTE — Progress Notes (Signed)
Mobility Specialist Progress Note:   03/03/22 0941  Mobility  Activity Ambulated independently in hallway  Activity Response Tolerated well  Distance Ambulated (ft) 300 ft  $Mobility charge 1 Mobility  Level of Assistance Independent  Assistive Device None  Mobility Referral Yes   Pt received in bed and agreeable. No complaints. Pt left in bathroom with all needs met and call bell in reach.   Makenze Ellett Mobility Specialist-Acute Rehab Secure Chat only

## 2022-03-03 NOTE — Progress Notes (Signed)
   Subjective/Chief Complaint: Having bowel function, some bloating with some foods   Objective: Vital signs in last 24 hours: Temp:  [97.8 F (36.6 C)-98.7 F (37.1 C)] 97.8 F (36.6 C) (10/06 0733) Pulse Rate:  [63-72] 63 (10/06 0733) Resp:  [15-19] 18 (10/06 0733) BP: (121-128)/(56-74) 123/64 (10/06 0733) SpO2:  [93 %-96 %] 95 % (10/06 0733) Last BM Date : 03/01/22  Intake/Output from previous day: 10/05 0701 - 10/06 0700 In: 677 [I.V.:677] Out: -  Intake/Output this shift: No intake/output data recorded.  Ab soft nontender no hernia noted  Lab Results:  Recent Labs    02/28/22 2201 03/01/22 0307  WBC 6.9 6.1  HGB 13.9 12.5  HCT 43.6 37.6  PLT 236 200   BMET Recent Labs    03/01/22 0307 03/03/22 0449  NA 139 139  K 4.0 3.4*  CL 104 105  CO2 27 25  GLUCOSE 86 82  BUN 6* 9  CREATININE 0.52 0.46  CALCIUM 9.0 8.9   PT/INR No results for input(s): "LABPROT", "INR" in the last 72 hours. ABG No results for input(s): "PHART", "HCO3" in the last 72 hours.  Invalid input(s): "PCO2", "PO2"  Studies/Results: DG Abd Portable 1V-Small Bowel Obstruction Protocol-initial, 8 hr delay  Result Date: 03/01/2022 CLINICAL DATA:  Small bowel obstruction EXAM: PORTABLE ABDOMEN - 1 VIEW COMPARISON:  CT abdomen/pelvis dated 02/28/2022 FINDINGS: Nonobstructive bowel gas pattern. Contrast in the left colon, from the splenic flexure to the sigmoid colon. Lumbar spine fixation hardware. IMPRESSION: Nonobstructive bowel gas pattern. Contrast in the left colon, extending to the sigmoid. Electronically Signed   By: Julian Hy M.D.   On: 03/01/2022 20:08    Anti-infectives: Anti-infectives (From admission, onward)    None       Assessment/Plan: Ab pain -question of obstruction although dont think was obstructed.  I don't think this is true hernia and I don't think she needs surgery.she has bowel function. I think some of this is functional with bloating and may be  worse with some foods -she can dc home today and f/u as needed with Dr Ninfa Linden  I reviewed hospitalist notes, last 24 h vitals and pain scores, last 24 h labs and trends, and last 24 h imaging results.  This care required straight-forward level of medical decision making.   Rolm Bookbinder 03/03/2022

## 2022-03-03 NOTE — Plan of Care (Signed)

## 2022-03-03 NOTE — Progress Notes (Signed)
Patient has been discharged.

## 2022-03-03 NOTE — Care Management Important Message (Signed)
Important Message  Patient Details  Name: Terri Harris MRN: [id number] Date of Birth: December 07, 1940   Medicare Important Message Given:  Yes     Orbie Pyo 03/03/2022, 2:56 PM

## 2022-03-06 ENCOUNTER — Telehealth: Payer: Self-pay | Admitting: *Deleted

## 2022-03-06 ENCOUNTER — Encounter: Payer: Self-pay | Admitting: *Deleted

## 2022-03-06 NOTE — Patient Outreach (Signed)
  Care Coordination [facility] Note Transition Care Management Follow-up Telephone Call Date of discharge and from where: 03/03/22 from [facility] How have you been since you were released from the hospital? "I feel pretty good. I just don't have any energy. My stomach is bloated and I can't eat very much" Any questions or concerns? Yes. Questions about diagnosis of colon blockage and hernia. Reviewed hosp notes and imaging reports. Questions answered.  Items Reviewed: Did the pt receive and understand the discharge instructions provided? Yes  Medications obtained and verified? Yes . Potassium due to hypokalemia. Last result was 3.4. Other? Yes . Discussed recurrent abdominal bloating and early satiety. Denies abd pain. Taking Mylanta Maximum strength 32m 2 to 3 times a day. Advised that 4 doses in a 24 hour period is the maximum amount. Discussed left inguinal hernia. Patient denies pain. She can feel the hernia but is not able to reduce it. Associates the abdominal bloating with the hernia. Developed bloating and early satiety about 4 days before presenting to the hospital. Bloating improved while in the hospital but returned once home. Recommended she eat multiple small meals and to reduce the amount of gas producing foods that she eats for now (cruciferous vegetables, beans, etc). Has had somewhat normal bowel movements since discharge. Two today and 2nd was a little more formed. Has not seen a GI in years. Surgeon was consulted but did not think she was a surgical candidate.  Any new allergies since your discharge? No  Dietary orders reviewed? Yes. Low sodium heat healthy Do you have support at home? Yes   Home Care and Equipment/Supplies: Were home health services ordered? no If so, what is the name of the agency? N/a  Has the agency set up a time to come to the patient's home? not applicable Were any new equipment or medical supplies ordered?  No What is the name of the medical supply agency? N/a Were  you able to get the supplies/equipment? not applicable Do you have any questions related to the use of the equipment or supplies? No  Functional Questionnaire: (I = Independent and D = Dependent) ADLs: I  Bathing/Dressing- I  Meal Prep- I  Eating- I  Maintaining continence- I  Transferring/Ambulation- I  Managing Meds- I  Follow up appointments reviewed:  PCP Hospital f/u appt confirmed? Yes  Scheduled to see Dr LWolfgang Phoenixon 03/13/22 @ 11:20. [facility]  Not indicated. RNCC will recommend GI f/u to PCP   Are transportation arrangements needed? No  If their condition worsens, is the pt aware to call PCP or go to the Emergency Dept.? Yes Was the patient provided with contact information for the PCP's office or ED? Yes Was to pt encouraged to call back with questions or concerns? Yes  SDOH assessments and interventions completed:   Yes  Care Coordination Interventions Activated:  Yes   Care Coordination Interventions:  Added appointment notes that potassium needs to be recked at PCP visit and collaborated with PCP regarding current symptoms and potential need for GI referral to assess abd bloating    Encounter Outcome:  Pt. Visit Completed    KChong Sicilian BSN, RN-BC [place] [phone] #: [phone]

## 2022-03-13 ENCOUNTER — Ambulatory Visit (INDEPENDENT_AMBULATORY_CARE_PROVIDER_SITE_OTHER): Payer: PPO | Admitting: Family Medicine

## 2022-03-13 VITALS — BP 150/70 | HR 68 | Temp 97.2°F | Ht 65.0 in | Wt 119.0 lb

## 2022-03-13 DIAGNOSIS — Z23 Encounter for immunization: Secondary | ICD-10-CM | POA: Diagnosis not present

## 2022-03-13 DIAGNOSIS — E876 Hypokalemia: Secondary | ICD-10-CM

## 2022-03-13 NOTE — Progress Notes (Signed)
   Subjective:    Patient ID: Terri Harris, female    DOB: 05-15-1941, 81 y.o.   MRN: [id number]  HPI Hospitalization follow up for hypokalemia Patient has completed potassium pills  She was in the hospital with a possible hernia and abdominal partial bowel obstruction versus large bowel hernia After 48 hours in the hospital her abdominal swelling went down and she felt better and she was released She is having bowel movements urinating okay but does states she swells up with eating  Review of Systems     Objective:   Physical Exam Lungs clear hearts regular HEENT benign abdomen soft       Assessment & Plan:   Partial small bowel obstruction Patient I am sure has adhesions which are complicating the issue but nonetheless we will go forward with small meals frequently if this gets worse reconsult general surgery  Patient has a follow-up in November keep this for pain management

## 2022-03-14 ENCOUNTER — Encounter: Payer: Self-pay | Admitting: Family Medicine

## 2022-03-14 LAB — BASIC METABOLIC PANEL WITH GFR
BUN/Creatinine Ratio: 19 (ref 12–28)
BUN: 13 mg/dL (ref 8–27)
CO2: 26 mmol/L (ref 20–29)
Calcium: 10 mg/dL (ref 8.7–10.3)
Chloride: 100 mmol/L (ref 96–106)
Creatinine, Ser: 0.69 mg/dL (ref 0.57–1.00)
Glucose: 91 mg/dL (ref 70–99)
Potassium: 4.1 mmol/L (ref 3.5–5.2)
Sodium: 141 mmol/L (ref 134–144)
eGFR: 88 mL/min/1.73

## 2022-03-14 LAB — MAGNESIUM: Magnesium: 2.3 mg/dL (ref 1.6–2.3)

## 2022-03-15 ENCOUNTER — Telehealth: Payer: Self-pay | Admitting: Family Medicine

## 2022-03-15 NOTE — Telephone Encounter (Signed)
Infusion center states they need a new order Reclast 5 mg IV once per year

## 2022-03-16 NOTE — Telephone Encounter (Signed)
This was signed thank you 

## 2022-03-20 ENCOUNTER — Other Ambulatory Visit: Payer: Self-pay

## 2022-03-22 ENCOUNTER — Ambulatory Visit
Admission: EM | Admit: 2022-03-22 | Discharge: 2022-03-22 | Disposition: A | Discharge status: Home / Self Care | Payer: PPO | Attending: Family Medicine | Admitting: Family Medicine

## 2022-03-22 DIAGNOSIS — J069 Acute upper respiratory infection, unspecified: Secondary | ICD-10-CM

## 2022-03-22 DIAGNOSIS — J441 Chronic obstructive pulmonary disease with (acute) exacerbation: Secondary | ICD-10-CM

## 2022-03-22 MED ORDER — METHYLPREDNISOLONE SODIUM SUCC 125 MG IJ SOLR
80.0000 mg | Freq: Once | INTRAMUSCULAR | Status: AC
  Administered 2022-03-22: 80 mg via INTRAMUSCULAR

## 2022-03-22 MED ORDER — ALBUTEROL SULFATE HFA 108 (90 BASE) MCG/ACT IN AERS: 2.0000 | INHALATION_SPRAY | RESPIRATORY_TRACT | 0 refills | Status: DC | PRN

## 2022-03-22 MED ORDER — DOXYCYCLINE HYCLATE 100 MG PO CAPS: 100.0000 mg | ORAL_CAPSULE | Freq: Two times a day (BID) | ORAL | 0 refills | Status: DC

## 2022-03-22 MED ORDER — IPRATROPIUM-ALBUTEROL 0.5-2.5 (3) MG/3ML IN SOLN
3.0000 mL | Freq: Once | RESPIRATORY_TRACT | Status: AC
  Administered 2022-03-22: 3 mL via RESPIRATORY_TRACT

## 2022-03-22 MED ORDER — BUDESONIDE-FORMOTEROL FUMARATE 160-4.5 MCG/ACT IN AERO: 2.0000 | INHALATION_SPRAY | Freq: Two times a day (BID) | RESPIRATORY_TRACT | 0 refills | Status: DC

## 2022-03-22 NOTE — ED Triage Notes (Signed)
Pt reports cough, shortness of breath and chest congestion x 4 days. Tylenol gives no relief.

## 2022-03-22 NOTE — ED Provider Notes (Signed)
[facility]    CSN: [id number] Arrival date & time: 03/22/22  1046      History   Chief Complaint Chief Complaint  Patient presents with   Shortness of Breath   Cough    HPI Terri Harris is a 81 y.o. female.   Presenting today with 4-day history of progressively worsening cough, wheezing, shortness of breath, chest congestion.  Denies known fever, chills, chest pain, abdominal pain, nausea vomiting or diarrhea.  Known history of COPD, does not currently have any inhalers at home.  No known sick contacts recently.  Taking Tylenol with minimal relief.    Past Medical History:  Diagnosis Date   ANA positive 01/2001   Arthritis    Arthritis 07/02/2013   ANA positive   Cancer ([place])    partial hysterectomy age 35-uterine cancer   Coronary artery calcification 09/28/2021   GERD (gastroesophageal reflux disease)    Hx of tobacco use, presenting hazards to health 07/02/2013   Hypercholesteremia    Hypertension    Ileus, postoperative ([place]) 07/02/2013   Osteoporosis    Pneumonia    hx of   SBO (small bowel obstruction) ([place]) 06/20/2103    Patient Active Problem List   Diagnosis Date Noted   Colonic obstruction ([place]) 02/28/2022   Hypokalemia 02/28/2022   Aortic atherosclerosis ([place]) 09/28/2021   Coronary artery calcification 09/28/2021   COPD exacerbation ([place]) 09/02/2021   Senile purpura ([place]) 08/09/2021   Vitamin B12 deficiency 12/09/2019   Lumbar stenosis 01/07/2019   Nocturnal headaches 01/26/2016   Hyperlipidemia 04/19/2015   Chronic pain 04/19/2015   HTN (hypertension) 03/27/2014   GERD (gastroesophageal reflux disease) 07/02/2013   Hx of tobacco use, presenting hazards to health 07/02/2013   Arthritis 07/02/2013   Gastritis 04/13/2013   Abdominal pain, unspecified site 04/13/2013   Osteoporosis 04/13/2013   Personal history of colonic polyps 04/13/2013   COPD (chronic obstructive pulmonary disease) ([place]) 04/13/2013    Past Surgical History:   Procedure Laterality Date   ABDOMINAL HYSTERECTOMY     ANTERIOR LAT LUMBAR FUSION N/A 06/08/2014   Procedure: LUMBAR 2-3 LUMBAR 3-4 LUMBAR 4-5 ANTEROLATERAL DECOMPRESSION/FUSION WITH PERCUTANEOUS PEDICLE SCREWS;  Surgeon: Kristeen Miss, MD;  [facility];  Service: Neurosurgery;  Laterality: N/A;  L2-3 L3-4 L4-5 ANTEROLATERAL DECOMPRESSION/FUSION WITH PERCUTANEOUS PEDICLE SCREWS   ANTERIOR LAT LUMBAR FUSION Left 01/07/2019   Procedure: Lumbar one-two Anterolateral lumbar interbody fusion with lateral plate fixation;  Surgeon: Kristeen Miss, MD;  Location: [place];  Service: Neurosurgery;  Laterality: Left;   BACK SURGERY     BOWEL RESECTION N/A 06/20/2013   Procedure: SMALL BOWEL RESECTION;  Surgeon: Harl Bowie, MD;  Location: [place];  Service: General;  Laterality: N/A;   COLON SURGERY     COLONOSCOPY  10/25/2011   Procedure: COLONOSCOPY;  Surgeon: Rogene Houston, MD;  [facility];  Service: Endoscopy;  Laterality: N/A;  930   COLONOSCOPY N/A 05/09/2017   Procedure: COLONOSCOPY;  Surgeon: Rogene Houston, MD;  [facility];  Service: Endoscopy;  Laterality: N/A;  1030   ESOPHAGEAL DILATION N/A 05/25/2015   Procedure: ESOPHAGEAL DILATION;  Surgeon: Rogene Houston, MD;  [facility];  Service: Endoscopy;  Laterality: N/A;   ESOPHAGOGASTRODUODENOSCOPY  02/2009   ESOPHAGOGASTRODUODENOSCOPY N/A 05/25/2015   Procedure: ESOPHAGOGASTRODUODENOSCOPY (EGD);  Surgeon: Rogene Houston, MD;  [facility];  Service: Endoscopy;  Laterality: N/A;   HERNIA REPAIR Left    groin  INGUINAL HERNIA REPAIR Left 09/11/2013   Procedure: HERNIA REPAIR INGUINAL INCARCERATED;  Surgeon: Harl Bowie, MD;  Location: [place];  Service: General;  Laterality: Left;   LAPAROTOMY N/A 06/20/2013   Procedure: EXPLORATORY LAPAROTOMY;  Surgeon: Harl Bowie, MD;  Location: [place];  Service: General;  Laterality: N/A;   LUMBAR PERCUTANEOUS PEDICLE SCREW 3 LEVEL  N/A 06/08/2014   Procedure: LUMBAR PERCUTANEOUS PEDICLE SCREW 3 LEVEL;  Surgeon: Kristeen Miss, MD;  [facility];  Service: Neurosurgery;  Laterality: N/A;   POLYPECTOMY  05/09/2017   Procedure: POLYPECTOMY;  Surgeon: Rogene Houston, MD;  [facility];  Service: Endoscopy;;  sigmoid colon   TONSILLECTOMY      OB History   No obstetric history on file.      Home Medications    Prior to Admission medications   Medication Sig Start Date End Date Taking? Authorizing Provider  albuterol (VENTOLIN HFA) 108 (90 Base) MCG/ACT inhaler Inhale 2 puffs into the lungs every 4 (four) hours as needed for wheezing or shortness of breath. 03/22/22  Yes Volney American, PA-C  budesonide-formoterol [facility]) 160-4.5 MCG/ACT inhaler Inhale 2 puffs into the lungs 2 (two) times daily. Rinse mouth with water after each use 03/22/22  Yes Volney American, PA-C  doxycycline (VIBRAMYCIN) 100 MG capsule Take 1 capsule (100 mg total) by mouth 2 (two) times daily. 03/22/22  Yes Volney American, PA-C  acetaminophen (TYLENOL) 325 MG tablet Take 2 tablets (650 mg total) by mouth every 6 (six) hours as needed for mild pain (or Fever >/= 101). 04/03/21   Roxan Hockey, MD  albuterol (VENTOLIN HFA) 108 (90 Base) MCG/ACT inhaler Inhale 2 puffs into the lungs every 6 (six) hours as needed for wheezing or shortness of breath. 09/01/21   Coral Spikes, DO  alum & mag hydroxide-simeth (MAALOX/MYLANTA) 200-200-20 MG/5ML suspension Take 15 mLs by mouth every 6 (six) hours as needed for indigestion or heartburn.    [provider]  amLODipine (NORVASC) 5 MG tablet Take 1 tablet (5 mg total) by mouth daily. 12/29/21   Kathyrn Drown, MD  cyanocobalamin 1000 MCG tablet Take 1,000 mcg by mouth daily.    [provider]  oxyCODONE-acetaminophen (PERCOCET/ROXICET) 5-325 MG tablet 1 pill taken 3 or 4 times daily as needed for pain to last 30 days Patient taking differently: Take  1 tablet by mouth See admin instructions. Take 1 tablet by mouth 3 or 4 times daily as needed for pain to last 30 days 12/29/21   Kathyrn Drown, MD  pantoprazole (PROTONIX) 40 MG tablet TAKE 1 TABLET BY MOUTH EVERY DAY 01/13/22   Kathyrn Drown, MD  polyethylene glycol (MIRALAX / GLYCOLAX) 17 g packet Take 17 g by mouth daily. 04/03/21   Roxan Hockey, MD  potassium chloride SA (KLOR-CON M) 20 MEQ tablet Take 2 tablets (40 mEq total) by mouth daily. Patient not taking: Reported on 03/13/2022 03/04/22   Shelly Coss, MD  rosuvastatin (CRESTOR) 10 MG tablet Take 1 tablet (10 mg total) by mouth daily. 12/29/21   Kathyrn Drown, MD    Family History Family History  Problem Relation Age of Onset   Hypertension Mother    Heart disease Father    Diabetes Sister    Heart disease Sister    Diabetes Brother    Heart disease Brother    Anesthesia problems Neg Hx    Hypotension Neg Hx    Malignant hyperthermia Neg Hx  Pseudochol deficiency Neg Hx     Social History Social History   Tobacco Use   Smoking status: Former    Packs/day: 0.25    Years: 0.00    Total pack years: 0.00    Types: Cigarettes    Quit date: 10/18/2011    Years since quitting: 10.4   Smokeless tobacco: Never  Vaping Use   Vaping Use: Never used  Substance Use Topics   Alcohol use: No   Drug use: No     Allergies   Codeine and Fosamax [alendronate sodium]   Review of Systems Review of Systems PER HPI  Physical Exam Triage Vital Signs ED Triage Vitals  Enc Vitals Group     BP 03/22/22 1106 138/72     Pulse Rate 03/22/22 1106 100     Resp 03/22/22 1106 (!) 28     Temp 03/22/22 1106 98.9 F (37.2 C)     Temp Source 03/22/22 1106 Oral     SpO2 03/22/22 1106 92 %     Weight --      Height --      Head Circumference --      Peak Flow --      Pain Score 03/22/22 1105 0     Pain Loc --      Pain Edu? --      Excl. in [place]? --    No data found.  Updated Vital Signs BP 138/72 (BP Location:  Right Arm)   Pulse 100   Temp 98.9 F (37.2 C) (Oral)   Resp (!) 28   LMP  (LMP Unknown)   SpO2 98%   Visual Acuity Right Eye Distance:   Left Eye Distance:   Bilateral Distance:    Right Eye Near:   Left Eye Near:    Bilateral Near:     Physical Exam Vitals and nursing note reviewed.  Constitutional:      Comments: Upon initial presentation, slightly tripoding and tachypneic.  This improved significantly after DuoNeb treatment  HENT:     Head: Atraumatic.     Right Ear: Tympanic membrane and external ear normal.     Left Ear: Tympanic membrane and external ear normal.     Nose: Rhinorrhea present.     Mouth/Throat:     Mouth: Mucous membranes are moist.     Pharynx: Posterior oropharyngeal erythema present.  Eyes:     Extraocular Movements: Extraocular movements intact.     Conjunctiva/sclera: Conjunctivae normal.  Cardiovascular:     Rate and Rhythm: Normal rate and regular rhythm.     Heart sounds: Normal heart sounds.  Pulmonary:     Breath sounds: Wheezing present. No rales.     Comments: Tachypneic, tripoding and audible wheezes upon initial presentation but significant improvement after DuoNeb treatment.  Oxygen saturations on room air improved to 98% from 89 to 92% previously and she is breathing comfortably on room air. Musculoskeletal:        General: Normal range of motion.     Cervical back: Normal range of motion and neck supple.  Skin:    General: Skin is warm and dry.  Neurological:     Mental Status: She is alert and oriented to person, place, and time.  Psychiatric:        Mood and Affect: Mood normal.        Thought Content: Thought content normal.        Judgment: Judgment normal.      UC Treatments /  Results  Labs (all labs ordered are listed, but only abnormal results are displayed) Labs Reviewed - No data to display  EKG   Radiology No results found.  Procedures Procedures (including critical care time)  Medications Ordered in  UC Medications  ipratropium-albuterol (DUONEB) 0.5-2.5 (3) MG/3ML nebulizer solution 3 mL (3 mLs Nebulization Given 03/22/22 1111)  methylPREDNISolone sodium succinate (SOLU-MEDROL) 125 mg/2 mL injection 80 mg (80 mg Intramuscular Given 03/22/22 1151)    Initial Impression / Assessment and Plan / UC Course  I have reviewed the triage vital signs and the nursing notes.  Pertinent labs & imaging results that were available during my care of the patient were reviewed by me and considered in my medical decision making (see chart for details).     Tachypneic and borderline hypoxic in triage, both significantly improved after DuoNeb treatment.  Suspect viral upper respiratory infection causing a COPD exacerbation.  Treat with IM Solu-Medrol, doxycycline, albuterol and Symbicort inhaler.  She already has a follow-up scheduled next week with her PCP and will use this as a lung recheck.  Knows to follow-up sooner if not improving or worsening.  Final Clinical Impressions(s) / UC Diagnoses   Final diagnoses:  Viral URI with cough  COPD exacerbation [facility])   Discharge Instructions   None    ED Prescriptions     Medication Sig Dispense Auth. Provider   doxycycline (VIBRAMYCIN) 100 MG capsule Take 1 capsule (100 mg total) by mouth 2 (two) times daily. 14 capsule Volney American, [place]   albuterol (VENTOLIN HFA) 108 (90 Base) MCG/ACT inhaler Inhale 2 puffs into the lungs every 4 (four) hours as needed for wheezing or shortness of breath. [place], PA-C   budesonide-formoterol [facility]) 160-4.5 MCG/ACT inhaler Inhale 2 puffs into the lungs 2 (two) times daily. Rinse mouth with water after each use 1 each Volney American, PA-C      PDMP not reviewed this encounter.   Volney American, [place] 03/22/22 1156

## 2022-03-31 ENCOUNTER — Ambulatory Visit: Payer: PPO | Admitting: Family Medicine

## 2022-04-04 ENCOUNTER — Encounter: Payer: Self-pay | Admitting: Family Medicine

## 2022-04-04 ENCOUNTER — Ambulatory Visit (INDEPENDENT_AMBULATORY_CARE_PROVIDER_SITE_OTHER): Payer: PPO | Admitting: Family Medicine

## 2022-04-04 VITALS — BP 132/70 | Wt 117.0 lb

## 2022-04-04 DIAGNOSIS — K56600 Partial intestinal obstruction, unspecified as to cause: Secondary | ICD-10-CM

## 2022-04-04 DIAGNOSIS — I10 Essential (primary) hypertension: Secondary | ICD-10-CM | POA: Diagnosis not present

## 2022-04-04 MED ORDER — ALBUTEROL SULFATE HFA 108 (90 BASE) MCG/ACT IN AERS: 2.0000 | INHALATION_SPRAY | RESPIRATORY_TRACT | 0 refills | Status: DC | PRN

## 2022-04-04 MED ORDER — OXYCODONE-ACETAMINOPHEN 5-325 MG PO TABS: ORAL_TABLET | ORAL | 0 refills | Status: DC

## 2022-04-04 NOTE — Patient Instructions (Signed)
Please read carefully-this is the current policy.  All patients who are on chronic pain management will need to follow this policy.  As part of your visit today we have covered your chronic pain. You have been given prescription(s) for pain medicines.The DEA and [place] require that any patient on pain medications must be seen every 3 months. You will need to schedule an office visit today before further pain medications are issued. If you fail to come for your office visit we will not be sending in another month on the prescription pain meds.  You will always need to bring your pain medicine bottle to your office visit. We will be doing a random pill count.  Since we are managing your pain, do not get pain scripts from other doctors. We check the electronic database prescription registry regularly. If you are receiving pain medicines from another source we will STOP prescribing pain medicines.   We will not refill medications or early nor will we give an extended month supply at the end of these prescriptions.It is your responsibility to keep up with medications and attend your office visit. Lost or stolen pain medications will not be replaced.  It is your responsibility to schedule an office visit in 3 months to be seen before you are out of your medication. Do not call our office to request early refills or additional refills.  Office appointments fill up quickly so therefore we will schedule this office visit now for 3 months.  We believe that most patients take their meds as prescribed but drug misuse and diversion is a serious problem in the [place]. Our office does standard measures to insure proper care to all. All patients are subject to random urine drug screens/ saliva tests and random pill counts. Also all patients drug prescription records are reviewed on a regular basis in accordance with [facility] medical board policies.  Remember, do not use alcohol or illegal drugs with your pain  medications. If you are feeling drowsy or affected by your medicine you are not to operate any machinery , do any dangerous activities or drive while this is occurring.   We are required by law to adhere to strict regulations. Failure on our part to follow these regulations could jeopardize our prescription license which in turn would cause [place] not to be able to care for you.Thank you for your understanding and following these policies.  [place]

## 2022-04-04 NOTE — Progress Notes (Signed)
Subjective:    Patient ID: Terri Harris, female    DOB: [birth year], 81 y.o.   MRN: [id number]  HPI This patient was seen today for chronic pain  The medication list was reviewed and updated.  Location of Pain for which the patient has been treated with regarding narcotics: Mid back low back hips  Onset of this pain: Her pain is primarily been going on for multiple years getting worse as she gets older   -Compliance with medication: Oxycodone 5-325 mg  - Number patient states they take daily: 4  -when was the last dose patient took? This morning  The patient was advised the importance of maintaining medication and not using illegal substances with these.  Here for refills and follow up  The patient was educated that we can provide 3 monthly scripts for their medication, it is their responsibility to follow the instructions.  Side effects or complications from medications: none  Patient is aware that pain medications are meant to minimize the severity of the pain to allow their pain levels to improve to allow for better function. They are aware of that pain medications cannot totally remove their pain.  Due for UDT ( at least once per year) : completed 12/2021  Scale of 1 to 10 ( 1 is least 10 is most) Your pain level without the medicine: 10 Your pain level with medication: 4  Scale 1 to 10 ( 1-helps very little, 10 helps very well) How well does your pain medication reduce your pain so you can function better through out the day? 10  Quality of the pain: Throbbing aching  Persistence of the pain: Present all the time  Modifying factors: Worse with active getting  Patient in the past has been on anti-inflammatories which did not help enough.  Also she has been on pain medicines intermittently for multiple years responsibly using the pain medicines not abusing them.      Review of Systems     Objective:   Physical Exam  General-in no acute distress Eyes-no  discharge Lungs-respiratory rate normal, CTA CV-no murmurs,RRR Extremities skin warm dry no edema Neuro grossly normal Behavior normal, alert       Assessment & Plan:  1. Primary hypertension Blood pressure good control continue current measures  2. Partial small bowel obstruction (HCC) No obvious signs of any small bowel obstruction currently soft diet vegetables fruits lean meats recommended.  If any ongoing troubles problems or worsening issues to immediately notify us  The patient was seen in followup for chronic pain. A review over at their current pain status was discussed. Drug registry was checked. Prescriptions were given.  Regular follow-up recommended. Discussion was held regarding the importance of compliance with medication as well as pain medication contract.  Patient was informed that medication may cause drowsiness and should not be combined  with other medications/alcohol or street drugs. If the patient feels medication is causing altered alertness then do not drive or operate dangerous equipment.  Should be noted that the patient appears to be meeting appropriate use of opioids and response.  Evidenced by improved function and decent pain control without significant side effects and no evidence of overt aberrancy issues.  Upon discussion with the patient today they understand that opioid therapy is optional and they feel that the pain has been refractory to reasonable conservative measures and is significant and affecting quality of life enough to warrant ongoing therapy and wishes to continue opioids.  Refills were provided.  Not to exceed 4 tablets daily

## 2022-04-17 ENCOUNTER — Encounter (INDEPENDENT_AMBULATORY_CARE_PROVIDER_SITE_OTHER): Payer: Self-pay | Admitting: *Deleted

## 2022-04-21 ENCOUNTER — Other Ambulatory Visit: Payer: Self-pay | Admitting: Family Medicine

## 2022-04-21 NOTE — Telephone Encounter (Signed)
Urgent Care patient Requested Prescriptions  Pending Prescriptions Disp Refills   SYMBICORT 160-4.5 MCG/ACT inhaler [Pharmacy Med Name: SYMBICORT 160-4.5 MCG INHALER] 10.2 each     Sig: INHALE 2 PUFFS INTO THE LUNGS 2 (TWO) TIMES DAILY. RINSE MOUTH WITH WATER AFTER EACH USE     There is no refill protocol information for this order

## 2022-05-16 ENCOUNTER — Other Ambulatory Visit: Payer: Self-pay

## 2022-05-16 ENCOUNTER — Telehealth: Payer: Self-pay | Admitting: Pharmacy Technician

## 2022-05-16 DIAGNOSIS — M810 Age-related osteoporosis without current pathological fracture: Secondary | ICD-10-CM | POA: Insufficient documentation

## 2022-05-16 NOTE — Telephone Encounter (Signed)
Dr. Wolfgang Phoenix, Juluis Rainier note:  Auth Submission: NO AUTH NEEDED Payer: [email or web address]t Medication & CPT/J Code(s) submitted: Reclast (Zolendronic acid) B7127 Route of submission (phone, fax, portal):  Phone # Fax # Auth type: Buy/Bill Units/visits requested: x1 Reference number:  Approval from: 05/16/22 to 05/17/23   Patient will be scheduled as soon as possible

## 2022-05-16 NOTE — Telephone Encounter (Signed)
Thank you :)

## 2022-05-23 ENCOUNTER — Encounter (HOSPITAL_COMMUNITY): Payer: PPO

## 2022-05-23 ENCOUNTER — Encounter (HOSPITAL_COMMUNITY)
Admission: RE | Admit: 2022-05-23 | Discharge: 2022-05-23 | Disposition: A | Discharge status: Home / Self Care | Payer: PPO | Source: Ambulatory Visit | Attending: Family Medicine | Admitting: Family Medicine

## 2022-05-23 VITALS — BP 140/69 | HR 75 | Temp 97.9°F | Resp 16

## 2022-05-23 DIAGNOSIS — M810 Age-related osteoporosis without current pathological fracture: Secondary | ICD-10-CM | POA: Insufficient documentation

## 2022-05-23 MED ORDER — ZOLEDRONIC ACID 5 MG/100ML IV SOLN
5.0000 mg | Freq: Once | INTRAVENOUS | Status: AC
  Administered 2022-05-23: 5 mg via INTRAVENOUS
  Filled 2022-05-23 (×2): qty 100

## 2022-05-23 NOTE — Progress Notes (Signed)
Diagnosis: Osteoporosis  Provider:  Sallee Lange MD  Procedure: Infusion  IV Type: Peripheral, IV Location: L Antecubital  Reclast (Zolendronic Acid), Dose: 5 mg  Infusion Start Time: 8329  Infusion Stop Time: 1916  Post Infusion IV Care: Patient declined observation  Discharge: Condition: Good, Destination: Home . AVS provided to patient.   Performed by:  Grayland Ormond, RN

## 2022-07-05 ENCOUNTER — Ambulatory Visit (INDEPENDENT_AMBULATORY_CARE_PROVIDER_SITE_OTHER): Payer: PPO | Admitting: Family Medicine

## 2022-07-05 VITALS — BP 122/84 | Wt 117.0 lb

## 2022-07-05 DIAGNOSIS — I10 Essential (primary) hypertension: Secondary | ICD-10-CM | POA: Diagnosis not present

## 2022-07-05 DIAGNOSIS — J449 Chronic obstructive pulmonary disease, unspecified: Secondary | ICD-10-CM | POA: Diagnosis not present

## 2022-07-05 DIAGNOSIS — E785 Hyperlipidemia, unspecified: Secondary | ICD-10-CM | POA: Diagnosis not present

## 2022-07-05 DIAGNOSIS — Z780 Asymptomatic menopausal state: Secondary | ICD-10-CM | POA: Diagnosis not present

## 2022-07-05 DIAGNOSIS — I700 Atherosclerosis of aorta: Secondary | ICD-10-CM

## 2022-07-05 DIAGNOSIS — D692 Other nonthrombocytopenic purpura: Secondary | ICD-10-CM

## 2022-07-05 DIAGNOSIS — Z13820 Encounter for screening for osteoporosis: Secondary | ICD-10-CM

## 2022-07-05 DIAGNOSIS — G8929 Other chronic pain: Secondary | ICD-10-CM

## 2022-07-05 MED ORDER — OXYCODONE-ACETAMINOPHEN 5-325 MG PO TABS: ORAL_TABLET | ORAL | 0 refills | Status: DC

## 2022-07-05 MED ORDER — ROSUVASTATIN CALCIUM 10 MG PO TABS: 10.0000 mg | ORAL_TABLET | Freq: Every day | ORAL | 1 refills | Status: DC

## 2022-07-05 MED ORDER — AMLODIPINE BESYLATE 5 MG PO TABS: 5.0000 mg | ORAL_TABLET | Freq: Every day | ORAL | 1 refills | Status: DC

## 2022-07-05 NOTE — Progress Notes (Signed)
This patient was seen today for chronic pain  The medication list was reviewed and updated.  Location of Pain for which the patient has been treated with regarding narcotics: Lumbar pain with sciatica  Onset of this pain: Present for years   -Compliance with medication: Good compliance with medicine  - Number patient states they take daily: Typically takes 3 or 4 every day  -when was the last dose patient took? 0200 this morning  The patient was advised the importance of maintaining medication and not using illegal substances with these.  Here for refills and follow up  The patient was educated that we can provide 3 monthly scripts for their medication, it is their responsibility to follow the instructions.  Side effects or complications from medications: Denies side effects with medicine  Patient is aware that pain medications are meant to minimize the severity of the pain to allow their pain levels to improve to allow for better function. They are aware of that pain medications cannot totally remove their pain.  Due for UDT ( at least once per year) : 12/29/2021  Scale of 1 to 10 ( 1 is least 10 is most) Your pain level without the medicine: 9 Your pain level with medication 5  Scale 1 to 10 ( 1-helps very little, 10 helps very well) How well does your pain medication reduce your pain so you can function better through out the day? 7  Quality of the pain: Burning throbbing aching  Persistence of the pain: Present all the time  Modifying factors: Worse with activity       Subjective:    Patient ID: Terri Harris, female    DOB: 11/14/1940, 82 y.o.   MRN: [id number]  HPI Primary hypertension - Plan: Basic metabolic panel  Hyperlipidemia, unspecified hyperlipidemia type - Plan: Lipid panel, Hepatic function panel  Encounter for chronic pain management  Encounter for osteoporosis screening in asymptomatic postmenopausal patient - Plan: DG Bone Density  Chronic  obstructive pulmonary disease, unspecified COPD type ([place]), Chronic  Aortic atherosclerosis ([place]), Chronic  Senile purpura ([place]), Chronic  Patient for blood pressure check up.  The patient does have hypertension.   Patient relates dietary measures try to minimize salt The importance of healthy diet and activity were discussed Patient relates compliance  Patient here for follow-up regarding cholesterol.    Patient relates taking medication on a regular basis Denies problems with medication Importance of dietary measures discussed Regular lab work regarding lipid and liver was checked and if needing additional labs was appropriately ordered  Patient also has osteoporosis gets her infusion on a yearly basis tolerates this well  Review of Systems     Objective:   Physical Exam General-in no acute distress Eyes-no discharge Lungs-respiratory rate normal, CTA CV-no murmurs,RRR Extremities skin warm dry no edema Neuro grossly normal Behavior normal, alert Some senile purpura on the arms       Assessment & Plan:  1. Primary hypertension Blood pressure decent control continue current measures - Basic metabolic panel  2. Hyperlipidemia, unspecified hyperlipidemia type Check lab work before next visit healthy diet recommended continue medicine - Lipid panel - Hepatic function panel  3. Encounter for chronic pain management The patient was seen in followup for chronic pain. A review over at their current pain status was discussed. Drug registry was checked. Prescriptions were given.  Regular follow-up recommended. Discussion was held regarding the importance of compliance with medication as well as pain medication contract.  Patient was informed  that medication may cause drowsiness and should not be combined  with other medications/alcohol or street drugs. If the patient feels medication is causing altered alertness then do not drive or operate dangerous equipment.  Should  be noted that the patient appears to be meeting appropriate use of opioids and response.  Evidenced by improved function and decent pain control without significant side effects and no evidence of overt aberrancy issues.  Upon discussion with the patient today they understand that opioid therapy is optional and they feel that the pain has been refractory to reasonable conservative measures and is significant and affecting quality of life enough to warrant ongoing therapy and wishes to continue opioids.  Refills were provided. Drug registry checked Patient states medication is beneficial and allows her to function better 3 scripts given Follow-up 3 months  4. Encounter for osteoporosis screening in asymptomatic postmenopausal patient Bone density - DG Bone Density  5. Chronic obstructive pulmonary disease, unspecified COPD type ([place]) Patient no longer smokes  6. Aortic atherosclerosis (HCC) Cholesterol medicine healthy diet follow-up on lipid profile  7. Senile purpura (HCC) Mild on arms not severe  Follow-up 3 months

## 2022-07-05 NOTE — Progress Notes (Signed)
   Subjective:    Patient ID: Terri Harris, female    DOB: February 14, 1941, 82 y.o.   MRN: [id number]  HPI     Review of Systems     Objective:   Physical Exam        Assessment & Plan:

## 2022-07-13 ENCOUNTER — Ambulatory Visit (HOSPITAL_COMMUNITY)
Admission: RE | Admit: 2022-07-13 | Discharge: 2022-07-13 | Disposition: A | Discharge status: Home / Self Care | Payer: PPO | Source: Ambulatory Visit | Attending: Family Medicine | Admitting: Family Medicine

## 2022-07-13 DIAGNOSIS — Z13820 Encounter for screening for osteoporosis: Secondary | ICD-10-CM | POA: Diagnosis not present

## 2022-07-13 DIAGNOSIS — Z780 Asymptomatic menopausal state: Secondary | ICD-10-CM | POA: Diagnosis not present

## 2022-07-13 DIAGNOSIS — M810 Age-related osteoporosis without current pathological fracture: Secondary | ICD-10-CM | POA: Diagnosis not present

## 2022-07-16 ENCOUNTER — Encounter: Payer: Self-pay | Admitting: Family Medicine

## 2022-08-12 ENCOUNTER — Other Ambulatory Visit: Payer: Self-pay | Admitting: Family Medicine

## 2022-08-14 NOTE — Telephone Encounter (Signed)
Unable to refill per protocol, last refill by another provider not at this practice.  Requested Prescriptions  Pending Prescriptions Disp Refills   SYMBICORT 160-4.5 MCG/ACT inhaler [Pharmacy Med Name: SYMBICORT 160-4.5 MCG INHALER] 10.2 each     Sig: INHALE 2 PUFFS INTO THE LUNGS 2 (TWO) TIMES DAILY. RINSE MOUTH WITH WATER AFTER EACH USE     There is no refill protocol information for this order

## 2022-09-12 ENCOUNTER — Other Ambulatory Visit: Payer: Self-pay | Admitting: Family Medicine

## 2022-09-27 DIAGNOSIS — I10 Essential (primary) hypertension: Secondary | ICD-10-CM | POA: Diagnosis not present

## 2022-09-27 DIAGNOSIS — E785 Hyperlipidemia, unspecified: Secondary | ICD-10-CM | POA: Diagnosis not present

## 2022-09-28 LAB — HEPATIC FUNCTION PANEL
ALT: 21 IU/L (ref 0–32)
AST: 32 IU/L (ref 0–40)
Albumin: 5.1 g/dL — ABNORMAL HIGH (ref 3.7–4.7)
Alkaline Phosphatase: 73 IU/L (ref 44–121)
Bilirubin Total: 0.5 mg/dL (ref 0.0–1.2)
Bilirubin, Direct: 0.15 mg/dL (ref 0.00–0.40)
Total Protein: 8.2 g/dL (ref 6.0–8.5)

## 2022-09-28 LAB — BASIC METABOLIC PANEL
BUN/Creatinine Ratio: 20 (ref 12–28)
BUN: 14 mg/dL (ref 8–27)
CO2: 22 mmol/L (ref 20–29)
Calcium: 10 mg/dL (ref 8.7–10.3)
Chloride: 100 mmol/L (ref 96–106)
Creatinine, Ser: 0.69 mg/dL (ref 0.57–1.00)
Glucose: 84 mg/dL (ref 70–99)
Potassium: 4.7 mmol/L (ref 3.5–5.2)
Sodium: 143 mmol/L (ref 134–144)
eGFR: 87 mL/min/{1.73_m2} (ref >59)

## 2022-09-28 LAB — LIPID PANEL
Chol/HDL Ratio: 2.1 ratio (ref 0.0–4.4)
Cholesterol, Total: 165 mg/dL (ref 100–199)
HDL: 79 mg/dL (ref >39)
LDL Chol Calc (NIH): 72 mg/dL (ref 0–99)
Triglycerides: 76 mg/dL (ref 0–149)
VLDL Cholesterol Cal: 14 mg/dL (ref 5–40)

## 2022-10-05 ENCOUNTER — Ambulatory Visit (INDEPENDENT_AMBULATORY_CARE_PROVIDER_SITE_OTHER): Payer: PPO | Admitting: Family Medicine

## 2022-10-05 VITALS — BP 129/70 | HR 72 | Ht 65.0 in | Wt 113.4 lb

## 2022-10-05 DIAGNOSIS — I10 Essential (primary) hypertension: Secondary | ICD-10-CM | POA: Diagnosis not present

## 2022-10-05 DIAGNOSIS — E785 Hyperlipidemia, unspecified: Secondary | ICD-10-CM | POA: Diagnosis not present

## 2022-10-05 DIAGNOSIS — G894 Chronic pain syndrome: Secondary | ICD-10-CM

## 2022-10-05 DIAGNOSIS — D692 Other nonthrombocytopenic purpura: Secondary | ICD-10-CM | POA: Diagnosis not present

## 2022-10-05 DIAGNOSIS — G8929 Other chronic pain: Secondary | ICD-10-CM

## 2022-10-05 MED ORDER — OXYCODONE-ACETAMINOPHEN 5-325 MG PO TABS: ORAL_TABLET | ORAL | 0 refills | Status: DC

## 2022-10-05 MED ORDER — ROSUVASTATIN CALCIUM 10 MG PO TABS: 10.0000 mg | ORAL_TABLET | Freq: Every day | ORAL | 1 refills | Status: DC

## 2022-10-05 MED ORDER — PANTOPRAZOLE SODIUM 40 MG PO TBEC: 40.0000 mg | DELAYED_RELEASE_TABLET | Freq: Every day | ORAL | 1 refills | Status: DC

## 2022-10-05 MED ORDER — AMLODIPINE BESYLATE 5 MG PO TABS: 5.0000 mg | ORAL_TABLET | Freq: Every day | ORAL | 1 refills | Status: DC

## 2022-10-05 NOTE — Progress Notes (Signed)
Subjective:    Patient ID: Terri Harris, female    DOB: 29-Sep-1940, 82 y.o.   MRN: [id number]  HPI This patient was seen today for chronic pain  The medication list was reviewed and updated.   Location of Pain for which the patient has been treated with regarding narcotics: Lumbar spine  Onset of this pain: Present for over 20 years   -Compliance with medication: Good compliance with medicine  - Number patient states they take daily: Number 120/month 4/day  -Reason for ongoing use of opioids lumbar spine pain related to failed relief of pain from previous measures.  Has tried in the past NSAIDs Tylenol physical therapy injections and surgery has chronic pain and discomfort in the back is on pain medication, has pain medicine contract, does yearly urine drug screen  What other measures have been tried outside of opioids Tylenol injections NSAIDs physical therapy surgery  In the ongoing specialists regarding this condition back specialist Dr. Wynetta Emery  -when was the last dose patient took? 0700 this morning  The patient was advised the importance of maintaining medication and not using illegal substances with these.  Here for refills and follow up  The patient was educated that we can provide 3 monthly scripts for their medication, it is their responsibility to follow the instructions.  Side effects or complications from medications: Denies side effects  Patient is aware that pain medications are meant to minimize the severity of the pain to allow their pain levels to improve to allow for better function. They are aware of that pain medications cannot totally remove their pain.  Due for UDT ( at least once per year) (pain management contract is also completed at the time of the UDT): 12/29/2021  Scale of 1 to 10 ( 1 is least 10 is most) Your pain level without the medicine: 9 Your pain level with medication 6  Scale 1 to 10 ( 1-helps very little, 10 helps very well) How well  does your pain medication reduce your pain so you can function better through out the day? 8  Quality of the pain: Aching throbbing  Persistence of the pain: Present all the time  Modifying factors: Worse with activity         Review of Systems     Objective:   Physical Exam General-in no acute distress Eyes-no discharge Lungs-respiratory rate normal, CTA CV-no murmurs,RRR Extremities skin warm dry no edema Neuro grossly normal Behavior normal, alert Senile purpura noted on the left arm       Assessment & Plan:  1. Encounter for chronic pain management The patient was seen in followup for chronic pain. A review over at their current pain status was discussed. Drug registry was checked. Prescriptions were given.  Regular follow-up recommended. Discussion was held regarding the importance of compliance with medication as well as pain medication contract.  Patient was informed that medication may cause drowsiness and should not be combined  with other medications/alcohol or street drugs. If the patient feels medication is causing altered alertness then do not drive or operate dangerous equipment.  Should be noted that the patient appears to be meeting appropriate use of opioids and response.  Evidenced by improved function and decent pain control without significant side effects and no evidence of overt aberrancy issues.  Upon discussion with the patient today they understand that opioid therapy is optional and they feel that the pain has been refractory to reasonable conservative measures and is significant and affecting  quality of life enough to warrant ongoing therapy and wishes to continue opioids.  Refills were provided. Drug registry checked 3 scripts given   2. Hyperlipidemia, unspecified hyperlipidemia type Healthy diet, labs reviewed, continue medicine  3. Primary hypertension Blood pressure doing well continue current medication  4. Senile purpura (HCC) Noted  on the arms stable Follow-up in 3 months

## 2022-12-13 ENCOUNTER — Other Ambulatory Visit: Payer: Self-pay

## 2022-12-27 ENCOUNTER — Ambulatory Visit (INDEPENDENT_AMBULATORY_CARE_PROVIDER_SITE_OTHER): Payer: PPO | Admitting: Family Medicine

## 2022-12-27 VITALS — BP 145/72 | HR 72 | Temp 97.7°F | Ht 65.0 in | Wt 114.2 lb

## 2022-12-27 DIAGNOSIS — R319 Hematuria, unspecified: Secondary | ICD-10-CM | POA: Diagnosis not present

## 2022-12-27 DIAGNOSIS — I159 Secondary hypertension, unspecified: Secondary | ICD-10-CM

## 2022-12-27 DIAGNOSIS — R300 Dysuria: Secondary | ICD-10-CM | POA: Diagnosis not present

## 2022-12-27 LAB — POCT URINALYSIS DIP (CLINITEK)
Glucose, UA: NEGATIVE mg/dL
Ketones, POC UA: NEGATIVE mg/dL
Nitrite, UA: NEGATIVE
Spec Grav, UA: 1.015 (ref 1.010–1.025)
Urobilinogen, UA: 0.2 E.U./dL
pH, UA: 6.5 (ref 5.0–8.0)

## 2022-12-27 NOTE — Progress Notes (Signed)
   Subjective:    Patient ID: Terri Harris, female    DOB: 1940-06-29, 82 y.o.   MRN: [id number]  HPI Pt comes in today for blood in your urine on Wednesday and Thursday but haven't seen it since Patient states she noticed blood in her urine on 2 days earlier this week denies dysuria denies fever chills sweats does have some risk factors for bladder cancer denies weight loss states she has not been eating well but she relates this to some emotional stress that she is going through She denies being depressed just stressed She denies flank pain but does relate low back pain but she has a history of low back pain and she relates it is radiating into the legs  Review of Systems     Objective:   Physical Exam  Subjective no flank pain subjective low back pain Lungs clear heart regular Pulse normal Abdomen soft Urine with trace RBC under the microscope I did not see any RBC      Assessment & Plan:  Gross hematuria-CT scan per protocol May well need urology with cystoscope Lab work ordered Patient has follow-up visit for pain management next week warning signs discussed

## 2022-12-28 NOTE — Addendum Note (Signed)
Addended byTrinidad Curet on: 12/28/2022 08:04 AM   Modules accepted: Orders

## 2022-12-29 DIAGNOSIS — I159 Secondary hypertension, unspecified: Secondary | ICD-10-CM | POA: Diagnosis not present

## 2023-01-04 ENCOUNTER — Ambulatory Visit (HOSPITAL_COMMUNITY)
Admission: RE | Admit: 2023-01-04 | Discharge: 2023-01-04 | Disposition: A | Discharge status: Home / Self Care | Payer: PPO | Source: Ambulatory Visit | Attending: Family Medicine | Admitting: Family Medicine

## 2023-01-04 DIAGNOSIS — R319 Hematuria, unspecified: Secondary | ICD-10-CM | POA: Diagnosis not present

## 2023-01-04 MED ORDER — IOHEXOL 300 MG/ML  SOLN
100.0000 mL | Freq: Once | INTRAMUSCULAR | Status: AC | PRN
  Administered 2023-01-04: 100 mL via INTRAVENOUS

## 2023-01-05 ENCOUNTER — Other Ambulatory Visit: Payer: Self-pay

## 2023-01-05 ENCOUNTER — Telehealth: Payer: Self-pay | Admitting: Family Medicine

## 2023-01-05 ENCOUNTER — Encounter: Payer: Self-pay | Admitting: Family Medicine

## 2023-01-05 ENCOUNTER — Ambulatory Visit: Payer: PPO | Admitting: Family Medicine

## 2023-01-05 VITALS — BP 120/80 | Temp 97.7°F | Ht 65.0 in | Wt 113.0 lb

## 2023-01-05 DIAGNOSIS — R319 Hematuria, unspecified: Secondary | ICD-10-CM

## 2023-01-05 DIAGNOSIS — G8929 Other chronic pain: Secondary | ICD-10-CM | POA: Diagnosis not present

## 2023-01-05 MED ORDER — OXYCODONE-ACETAMINOPHEN 5-325 MG PO TABS: ORAL_TABLET | ORAL | 0 refills | Status: DC

## 2023-01-05 NOTE — Progress Notes (Unsigned)
Subjective:    Patient ID: Terri Harris, female    DOB: [birth year], 82 y.o.   MRN: [id number]  HPI 3 month follow up pain management  Last dose this morning at 3 am when she wakes in pain 2 tabs at a time HTN follow up CT scan done yesterday She does have a hernia noted in the right lower groin There is no major setbacks or problems recently No more hematuria This patient was seen today for chronic pain  The medication list was reviewed and updated.   Location of Pain for which the patient has been treated with regarding narcotics: Lumbar  Onset of this pain: Present for years   -Compliance with medication: Good compliance  - Number patient states they take daily: 3  -Reason for ongoing use of opioids has tried NSAIDs tried Tylenol tried injections tried surgery unfortunately saddled with severe pain  What other measures have been tried outside of opioids please see above  In the ongoing specialists regarding this condition she does see her neurosurgeon specialist periodically she does not want to have any additional surgery  -when was the last dose patient took?  Yesterday  The patient was advised the importance of maintaining medication and not using illegal substances with these.  Here for refills and follow up  The patient was educated that we can provide 3 monthly scripts for their medication, it is their responsibility to follow the instructions.  Side effects or complications from medications: Denies  Patient is aware that pain medications are meant to minimize the severity of the pain to allow their pain levels to improve to allow for better function. They are aware of that pain medications cannot totally remove their pain.  Due for UDT ( at least once per year) (pain management contract is also completed at the time of the UDT): Due today  Scale of 1 to 10 ( 1 is least 10 is most) Your pain level without the medicine: 8 Your pain level with medication  5  Scale 1 to 10 ( 1-helps very little, 10 helps very well) How well does your pain medication reduce your pain so you can function better through out the day?  7  Quality of the pain: Throbbing aching  Persistence of the pain: Present all the time  Modifying factors: Worse with activity       Review of Systems     Objective:   Physical Exam General-in no acute distress Eyes-no discharge Lungs-respiratory rate normal, CTA CV-no murmurs,RRR Extremities skin warm dry no edema Neuro grossly normal Behavior normal, alert        Assessment & Plan:  Chronic pain 3 scripts given PDMP was checked It is in the patient's best interest to continue medication to allow for her to have better quality of life She does not abuse the medicine  Inguinal hernia not consequential warning signs discussed the risk of surgery outweighs the benefit of surgery monitor for now  Urology referral recommended for hematuria although this has resolved-I would be concerned about the possibility of bladder cancer in someone her age  The patient was seen in followup for chronic pain. A review over at their current pain status was discussed. Drug registry was checked. Prescriptions were given.  Regular follow-up recommended. Discussion was held regarding the importance of compliance with medication as well as pain medication contract.  Patient was informed that medication may cause drowsiness and should not be combined  with other medications/alcohol or street drugs.  If the patient feels medication is causing altered alertness then do not drive or operate dangerous equipment.  Should be noted that the patient appears to be meeting appropriate use of opioids and response.  Evidenced by improved function and decent pain control without significant side effects and no evidence of overt aberrancy issues.  Upon discussion with the patient today they understand that opioid therapy is optional and they feel  that the pain has been refractory to reasonable conservative measures and is significant and affecting quality of life enough to warrant ongoing therapy and wishes to continue opioids.  Refills were provided.  [facility] medical Board guidelines regarding the pain medicine has been reviewed.  CDC guidelines most updated 2022 has been reviewed by the prescriber.  PDMP is checked on a regular basis yearly urine drug screen and pain management contract

## 2023-01-05 NOTE — Telephone Encounter (Signed)
Nurses I did discuss CAT scan results with her is not necessary to call her Please find the result note and resolved that note by documenting that patient is aware Please go ahead with consultation with urology for hematuria she agrees to do that

## 2023-01-12 ENCOUNTER — Other Ambulatory Visit (HOSPITAL_COMMUNITY): Payer: Self-pay | Admitting: Neurological Surgery

## 2023-01-12 DIAGNOSIS — M48061 Spinal stenosis, lumbar region without neurogenic claudication: Secondary | ICD-10-CM | POA: Diagnosis not present

## 2023-01-12 DIAGNOSIS — S32009K Unspecified fracture of unspecified lumbar vertebra, subsequent encounter for fracture with nonunion: Secondary | ICD-10-CM | POA: Diagnosis not present

## 2023-01-12 DIAGNOSIS — M546 Pain in thoracic spine: Secondary | ICD-10-CM | POA: Diagnosis not present

## 2023-01-12 DIAGNOSIS — Z681 Body mass index (BMI) 19 or less, adult: Secondary | ICD-10-CM | POA: Diagnosis not present

## 2023-01-26 ENCOUNTER — Ambulatory Visit (HOSPITAL_COMMUNITY)
Admission: RE | Admit: 2023-01-26 | Discharge: 2023-01-26 | Disposition: A | Discharge status: Home / Self Care | Payer: PPO | Source: Ambulatory Visit | Attending: Neurological Surgery | Admitting: Neurological Surgery

## 2023-01-26 DIAGNOSIS — M48061 Spinal stenosis, lumbar region without neurogenic claudication: Secondary | ICD-10-CM | POA: Diagnosis not present

## 2023-01-26 DIAGNOSIS — M549 Dorsalgia, unspecified: Secondary | ICD-10-CM | POA: Diagnosis not present

## 2023-01-26 MED ORDER — GADOBUTROL 1 MMOL/ML IV SOLN
5.0000 mL | Freq: Once | INTRAVENOUS | Status: AC | PRN
  Administered 2023-01-26: 5 mL via INTRAVENOUS

## 2023-01-30 DIAGNOSIS — M5414 Radiculopathy, thoracic region: Secondary | ICD-10-CM | POA: Diagnosis not present

## 2023-01-30 DIAGNOSIS — M5114 Intervertebral disc disorders with radiculopathy, thoracic region: Secondary | ICD-10-CM | POA: Diagnosis not present

## 2023-02-12 DIAGNOSIS — M5137 Other intervertebral disc degeneration, lumbosacral region: Secondary | ICD-10-CM | POA: Diagnosis not present

## 2023-02-16 ENCOUNTER — Ambulatory Visit: Payer: PPO | Admitting: Urology

## 2023-02-22 ENCOUNTER — Emergency Department (HOSPITAL_COMMUNITY): Payer: PPO

## 2023-02-22 ENCOUNTER — Other Ambulatory Visit: Payer: Self-pay

## 2023-02-22 ENCOUNTER — Emergency Department (HOSPITAL_COMMUNITY)
Admission: EM | Admit: 2023-02-22 | Discharge: 2023-02-22 | Disposition: A | Discharge status: Home / Self Care | Payer: PPO | Attending: Emergency Medicine | Admitting: Emergency Medicine

## 2023-02-22 DIAGNOSIS — Z87891 Personal history of nicotine dependence: Secondary | ICD-10-CM | POA: Diagnosis not present

## 2023-02-22 DIAGNOSIS — I6782 Cerebral ischemia: Secondary | ICD-10-CM | POA: Insufficient documentation

## 2023-02-22 DIAGNOSIS — M549 Dorsalgia, unspecified: Secondary | ICD-10-CM | POA: Insufficient documentation

## 2023-02-22 DIAGNOSIS — G319 Degenerative disease of nervous system, unspecified: Secondary | ICD-10-CM | POA: Diagnosis not present

## 2023-02-22 DIAGNOSIS — Y92019 Unspecified place in single-family (private) house as the place of occurrence of the external cause: Secondary | ICD-10-CM | POA: Diagnosis not present

## 2023-02-22 DIAGNOSIS — S0181XA Laceration without foreign body of other part of head, initial encounter: Secondary | ICD-10-CM

## 2023-02-22 DIAGNOSIS — Z23 Encounter for immunization: Secondary | ICD-10-CM | POA: Diagnosis not present

## 2023-02-22 DIAGNOSIS — Z043 Encounter for examination and observation following other accident: Secondary | ICD-10-CM | POA: Diagnosis not present

## 2023-02-22 DIAGNOSIS — I700 Atherosclerosis of aorta: Secondary | ICD-10-CM | POA: Diagnosis not present

## 2023-02-22 DIAGNOSIS — S0291XA Unspecified fracture of skull, initial encounter for closed fracture: Secondary | ICD-10-CM | POA: Diagnosis not present

## 2023-02-22 DIAGNOSIS — J449 Chronic obstructive pulmonary disease, unspecified: Secondary | ICD-10-CM | POA: Insufficient documentation

## 2023-02-22 DIAGNOSIS — S02612A Fracture of condylar process of left mandible, initial encounter for closed fracture: Secondary | ICD-10-CM | POA: Diagnosis not present

## 2023-02-22 DIAGNOSIS — S0993XA Unspecified injury of face, initial encounter: Secondary | ICD-10-CM

## 2023-02-22 DIAGNOSIS — S01511A Laceration without foreign body of lip, initial encounter: Secondary | ICD-10-CM

## 2023-02-22 DIAGNOSIS — F10129 Alcohol abuse with intoxication, unspecified: Secondary | ICD-10-CM | POA: Diagnosis not present

## 2023-02-22 DIAGNOSIS — M79642 Pain in left hand: Secondary | ICD-10-CM | POA: Insufficient documentation

## 2023-02-22 DIAGNOSIS — F10220 Alcohol dependence with intoxication, uncomplicated: Secondary | ICD-10-CM | POA: Insufficient documentation

## 2023-02-22 DIAGNOSIS — I10 Essential (primary) hypertension: Secondary | ICD-10-CM | POA: Diagnosis not present

## 2023-02-22 DIAGNOSIS — F10920 Alcohol use, unspecified with intoxication, uncomplicated: Secondary | ICD-10-CM

## 2023-02-22 DIAGNOSIS — M47812 Spondylosis without myelopathy or radiculopathy, cervical region: Secondary | ICD-10-CM | POA: Diagnosis not present

## 2023-02-22 DIAGNOSIS — R519 Headache, unspecified: Secondary | ICD-10-CM | POA: Diagnosis not present

## 2023-02-22 DIAGNOSIS — S02622A Fracture of subcondylar process of left mandible, initial encounter for closed fracture: Secondary | ICD-10-CM | POA: Diagnosis not present

## 2023-02-22 DIAGNOSIS — G8929 Other chronic pain: Secondary | ICD-10-CM | POA: Diagnosis not present

## 2023-02-22 DIAGNOSIS — M4801 Spinal stenosis, occipito-atlanto-axial region: Secondary | ICD-10-CM | POA: Diagnosis not present

## 2023-02-22 DIAGNOSIS — R9431 Abnormal electrocardiogram [ECG] [EKG]: Secondary | ICD-10-CM | POA: Diagnosis not present

## 2023-02-22 DIAGNOSIS — W19XXXA Unspecified fall, initial encounter: Secondary | ICD-10-CM | POA: Diagnosis not present

## 2023-02-22 DIAGNOSIS — M160 Bilateral primary osteoarthritis of hip: Secondary | ICD-10-CM | POA: Diagnosis not present

## 2023-02-22 DIAGNOSIS — J439 Emphysema, unspecified: Secondary | ICD-10-CM | POA: Diagnosis not present

## 2023-02-22 DIAGNOSIS — S02609A Fracture of mandible, unspecified, initial encounter for closed fracture: Secondary | ICD-10-CM | POA: Diagnosis not present

## 2023-02-22 DIAGNOSIS — R58 Hemorrhage, not elsewhere classified: Secondary | ICD-10-CM | POA: Diagnosis not present

## 2023-02-22 DIAGNOSIS — F10929 Alcohol use, unspecified with intoxication, unspecified: Secondary | ICD-10-CM | POA: Diagnosis not present

## 2023-02-22 DIAGNOSIS — M19042 Primary osteoarthritis, left hand: Secondary | ICD-10-CM | POA: Diagnosis not present

## 2023-02-22 LAB — COMPREHENSIVE METABOLIC PANEL
ALT: 19 U/L (ref 0–44)
AST: 25 U/L (ref 15–41)
Albumin: 3.9 g/dL (ref 3.5–5.0)
Alkaline Phosphatase: 51 U/L (ref 38–126)
Anion gap: 15 (ref 5–15)
BUN: 22 mg/dL (ref 8–23)
CO2: 20 mmol/L — ABNORMAL LOW (ref 22–32)
Calcium: 9.1 mg/dL (ref 8.9–10.3)
Chloride: 102 mmol/L (ref 98–111)
Creatinine, Ser: 0.89 mg/dL (ref 0.44–1.00)
GFR, Estimated: >60 mL/min (ref >60)
Glucose, Bld: 126 mg/dL — ABNORMAL HIGH (ref 70–99)
Potassium: 3.5 mmol/L (ref 3.5–5.1)
Sodium: 137 mmol/L (ref 135–145)
Total Bilirubin: 0.6 mg/dL (ref 0.3–1.2)
Total Protein: 6.9 g/dL (ref 6.5–8.1)

## 2023-02-22 LAB — CBC
HCT: 44.4 % (ref 36.0–46.0)
Hemoglobin: 14.7 g/dL (ref 12.0–15.0)
MCH: 33.6 pg (ref 26.0–34.0)
MCHC: 33.1 g/dL (ref 30.0–36.0)
MCV: 101.6 fL — ABNORMAL HIGH (ref 80.0–100.0)
Platelets: 206 10*3/uL (ref 150–400)
RBC: 4.37 MIL/uL (ref 3.87–5.11)
RDW: 14 % (ref 11.5–15.5)
WBC: 10.4 10*3/uL (ref 4.0–10.5)
nRBC: 0 % (ref 0.0–0.2)

## 2023-02-22 LAB — CK: Total CK: 59 U/L (ref 38–234)

## 2023-02-22 LAB — I-STAT CHEM 8, ED
BUN: 29 mg/dL — ABNORMAL HIGH (ref 8–23)
Calcium, Ion: 1.11 mmol/L — ABNORMAL LOW (ref 1.15–1.40)
Chloride: 103 mmol/L (ref 98–111)
Creatinine, Ser: 1.2 mg/dL — ABNORMAL HIGH (ref 0.44–1.00)
Glucose, Bld: 124 mg/dL — ABNORMAL HIGH (ref 70–99)
HCT: 46 % (ref 36.0–46.0)
Hemoglobin: 15.6 g/dL — ABNORMAL HIGH (ref 12.0–15.0)
Potassium: 3.7 mmol/L (ref 3.5–5.1)
Sodium: 138 mmol/L (ref 135–145)
TCO2: 23 mmol/L (ref 22–32)

## 2023-02-22 LAB — SAMPLE TO BLOOD BANK

## 2023-02-22 LAB — ETHANOL: Alcohol, Ethyl (B): 223 mg/dL — ABNORMAL HIGH (ref <10)

## 2023-02-22 MED ORDER — HYDROMORPHONE HCL 1 MG/ML IJ SOLN
0.5000 mg | Freq: Once | INTRAMUSCULAR | Status: AC
  Administered 2023-02-22: 0.5 mg via INTRAVENOUS
  Filled 2023-02-22: qty 1

## 2023-02-22 MED ORDER — TETANUS-DIPHTH-ACELL PERTUSSIS 5-2.5-18.5 LF-MCG/0.5 IM SUSY
0.5000 mL | PREFILLED_SYRINGE | Freq: Once | INTRAMUSCULAR | Status: AC
  Administered 2023-02-22: 0.5 mL via INTRAMUSCULAR
  Filled 2023-02-22: qty 0.5

## 2023-02-22 MED ORDER — OXYCODONE HCL 5 MG PO TABS
5.0000 mg | ORAL_TABLET | ORAL | 0 refills | Status: DC | PRN
Start: 2023-02-22 — End: 2023-07-10

## 2023-02-22 MED ORDER — FENTANYL CITRATE PF 50 MCG/ML IJ SOSY
50.0000 ug | PREFILLED_SYRINGE | Freq: Once | INTRAMUSCULAR | Status: AC
  Administered 2023-02-22: 50 ug via INTRAVENOUS
  Filled 2023-02-22: qty 1

## 2023-02-22 MED ORDER — AMOXICILLIN-POT CLAVULANATE 875-125 MG PO TABS
1.0000 | ORAL_TABLET | Freq: Two times a day (BID) | ORAL | 0 refills | Status: AC
Start: 2023-02-22 — End: 2023-03-08

## 2023-02-22 MED ORDER — ACETAMINOPHEN 500 MG PO TABS
1000.0000 mg | ORAL_TABLET | ORAL | Status: AC
  Administered 2023-02-22: 1000 mg via ORAL
  Filled 2023-02-22: qty 2

## 2023-02-22 MED ORDER — LIDOCAINE-EPINEPHRINE (PF) 2 %-1:200000 IJ SOLN
10.0000 mL | Freq: Once | INTRAMUSCULAR | Status: AC
  Administered 2023-02-22: 10 mL via INTRADERMAL
  Filled 2023-02-22: qty 20

## 2023-02-22 MED ORDER — FENTANYL CITRATE PF 50 MCG/ML IJ SOSY
PREFILLED_SYRINGE | INTRAMUSCULAR | Status: AC
  Administered 2023-02-22: 50 ug
  Filled 2023-02-22: qty 1

## 2023-02-22 NOTE — Discharge Instructions (Addendum)
You were seen for your jaw fracture and facial trauma in the emergency department.   At home, please take the antibiotics (Augmentin) to prevent infection.  Take Tylenol for your pain.  You may take the oxycodone for any breakthrough pain that you have.  Please eat a soft diet until you follow-up with ENT.  This includes foods that you can drink through a straw.  See the attached sheet for additional information.  Check your MyChart online for the results of any tests that had not resulted by the time you left the emergency department.   Follow-up with your primary doctor in 2-3 days regarding your visit.  Follow-up with ENT on Tuesday.  Return immediately to the emergency department if you experience any of the following: Difficulty breathing, severe headache, vomiting, or any other concerning symptoms.    Thank you for visiting our Emergency Department. It was a pleasure taking care of you today.

## 2023-02-22 NOTE — Consult Note (Signed)
ENT CONSULT:  Reason for Consult: condylar head fracture of the left mandible s/p fall    HPI: Terri Harris is an 82 y.o. female with hx COPD, former smoker, hx of chronic back pain, colonic obstruction/inguinal hernia requiring surgery per husband, who presented to the ED today via EMS following unwitnessed fall, admits to drinking prior to falling, unsure if she passed out, complaining of left facial pain.  CT max face with acute nondisplaced fracture of condylar head of the left mandible no other fractures.  CT head and CT cervical spine without acute findings.  Patient reports facial pain and sustained 2 lacerations along the upper chin near vermilion border and in the mid submental area as well as along the lower lip vestibule.  She is nearly edentulous at baseline with few remnant teeth along the anterior mandible and some molar teeth intact along the posterior maxilla bilaterally.  She primarily consumes soft and very foods due to history of bowel obstruction requiring surgery per husband.  Not on blood thinners or aspirin.   Records Reviewed:  ED documentation from today  83 year old female with a history of arthritis and chronic back pain who presents emergency department after a fall. Per EMS patient fell at home. Family reported that they thought she was drinking prior to this happening. Patient does not recall how she fell. Complaining of facial pain and left hand pain. EMS noted that several teeth came out. Patient unsure exactly when this happened. Does not take aspirin or any blood thinners.   Office note by Dr Gerda Diss, Family Medicine    Past Medical History:  Diagnosis Date   ANA positive 01/2001   Arthritis    Arthritis 07/02/2013   ANA positive   Cancer (HCC)    partial hysterectomy age 82-uterine cancer   Coronary artery calcification 09/28/2021   GERD (gastroesophageal reflux disease)    Hx of tobacco use, presenting hazards to health 07/02/2013   Hypercholesteremia     Hypertension    Ileus, postoperative (HCC) 07/02/2013   Osteoporosis    Pneumonia    hx of   SBO (small bowel obstruction) (HCC) 06/20/2103    Past Surgical History:  Procedure Laterality Date   ABDOMINAL HYSTERECTOMY     ANTERIOR LAT LUMBAR FUSION N/A 06/08/2014   Procedure: LUMBAR 2-3 LUMBAR 3-4 LUMBAR 4-5 ANTEROLATERAL DECOMPRESSION/FUSION WITH PERCUTANEOUS PEDICLE SCREWS;  Surgeon: Barnett Abu, MD;  Location: [facility];  Service: Neurosurgery;  Laterality: N/A;  L2-3 L3-4 L4-5 ANTEROLATERAL DECOMPRESSION/FUSION WITH PERCUTANEOUS PEDICLE SCREWS   ANTERIOR LAT LUMBAR FUSION Left 01/07/2019   Procedure: Lumbar one-two Anterolateral lumbar interbody fusion with lateral plate fixation;  Surgeon: Barnett Abu, MD;  Location: [facility] OR;  Service: Neurosurgery;  Laterality: Left;   BACK SURGERY     BOWEL RESECTION N/A 06/20/2013   Procedure: SMALL BOWEL RESECTION;  Surgeon: Shelly Rubenstein, MD;  Location: [facility] OR;  Service: General;  Laterality: N/A;   COLON SURGERY     COLONOSCOPY  10/25/2011   Procedure: COLONOSCOPY;  Surgeon: Malissa Hippo, MD;  Location: [facility];  Service: Endoscopy;  Laterality: N/A;  930   COLONOSCOPY N/A 05/09/2017   Procedure: COLONOSCOPY;  Surgeon: Malissa Hippo, MD;  Location: [facility];  Service: Endoscopy;  Laterality: N/A;  1030   ESOPHAGEAL DILATION N/A 05/25/2015   Procedure: ESOPHAGEAL DILATION;  Surgeon: Malissa Hippo, MD;  Location: [facility];  Service: Endoscopy;  Laterality: N/A;   ESOPHAGOGASTRODUODENOSCOPY  02/2009   ESOPHAGOGASTRODUODENOSCOPY  N/A 05/25/2015   Procedure: ESOPHAGOGASTRODUODENOSCOPY (EGD);  Surgeon: Malissa Hippo, MD;  Location: [facility];  Service: Endoscopy;  Laterality: N/A;   HERNIA REPAIR Left    groin   INGUINAL HERNIA REPAIR Left 09/11/2013   Procedure: HERNIA REPAIR INGUINAL INCARCERATED;  Surgeon: Shelly Rubenstein, MD;  Location: [facility] OR;  Service: General;  Laterality: Left;   LAPAROTOMY N/A 06/20/2013    Procedure: EXPLORATORY LAPAROTOMY;  Surgeon: Shelly Rubenstein, MD;  Location: [facility] OR;  Service: General;  Laterality: N/A;   LUMBAR PERCUTANEOUS PEDICLE SCREW 3 LEVEL N/A 06/08/2014   Procedure: LUMBAR PERCUTANEOUS PEDICLE SCREW 3 LEVEL;  Surgeon: Barnett Abu, MD;  Location: [facility];  Service: Neurosurgery;  Laterality: N/A;   POLYPECTOMY  05/09/2017   Procedure: POLYPECTOMY;  Surgeon: Malissa Hippo, MD;  Location: [facility];  Service: Endoscopy;;  sigmoid colon   TONSILLECTOMY      Family History  Problem Relation Age of Onset   Hypertension Mother    Heart disease Father    Diabetes Sister    Heart disease Sister    Diabetes Brother    Heart disease Brother    Anesthesia problems Neg Hx    Hypotension Neg Hx    Malignant hyperthermia Neg Hx    Pseudochol deficiency Neg Hx     Social History:  reports that she quit smoking about 11 years ago. Her smoking use included cigarettes. She started smoking about 11 years ago. She has never used smokeless tobacco. She reports that she does not drink alcohol and does not use drugs.  Allergies:  Allergies  Allergen Reactions   Codeine Nausea And Vomiting   Fosamax [Alendronate Sodium] Other (See Comments)    gastritis    Medications: I have reviewed the patient's current medications.  The PMH, PSH, Medications, Allergies, and SH were reviewed and updated.  ROS: Constitutional: Negative for fever Cardiovascular: Negative for chest pain has dyspnea at baseline 2/2 hx of COPD  Respiratory: Is not experiencing shortness of breath at rest. Gastrointestinal: Negative for nausea and vomiting. Neurological: Negative for headaches. Psychiatric: The patient is s/p ETOH tonight   Blood pressure 139/73, pulse 84, temperature 97.7 F (36.5 C), temperature source Oral, resp. rate (!) 27, height 5\' 5"  (1.651 m), weight 51.3 kg, SpO2 98%.  PHYSICAL EXAM:  Exam: General: frail, thin Respiratory effort: Equal inspiration and  expiration without stridor Cardiovascular Peripheral Vascular: Warm extremities with equal color/perfusion Eyes: No nystagmus with equal extraocular motion bilaterally Neuro/Psych/Balance: Patient oriented to person, place, and time; Appropriate mood and affect; Gait is intact with no imbalance; Cranial nerves I-XII are intact Head and Face Inspection: Normocephalic and atraumatic without mass or lesion Deep laceration along the inferior aspect of the mid and right vermilion border of the lower lip 4 cm Another deep laceration along the anterior mid submental area 3 cm Palpation: Facial skeleton intact without bony stepoffs Facial Strength: Facial motility symmetric and full bilaterally ENT Pinna: External ear intact and fully developed External canal: Canal is patent with intact skin Tympanic Membrane: Clear and mobile External Nose: No scar or anatomic deformity Internal Nose: Septum is without septal hematoma.  Lips, Teeth, and gums: Nearly edentulous. Intact anterior incisors along the mandible, rest of mandibular teeth absent. Molar teeth intact along maxilla b/l rest of the maxillary dentition absent Laceration along the right and mid vestibular aspect of the lower lip mucosa and underlying soft tissues opposite of the intact anterior mandibular incisors 3 cm Unable  to fully appreciate occlusion due to nearly edentulous status, no jaw subluxation or obvious signs of residual dentition malocclusion  TMJ: No pain to palpation with full mobility no trismus Oral cavity/oropharynx: No erythema or exudate, no lesions present deep oral laceration along right lower lip vestibule opposite of the anterior mandibular incisors Neck Neck and Trachea: Midline trachea without mass or lesion, in c-spine collar Thyroid: No mass or nodularity Lymphatics: No lymphadenopathy  Procedure: Multilayer closure of complex laceration deep laceration along right lower lip vestibule opposite of the anterior  mandibular incisors 3 cm Deep laceration along the inferior aspect of the mid and right vermilion border of the lower lip 4 cm Another deep laceration along the anterior mid submental area 3 cm  Informed consent was obtained.Approximately 8cc of lidocaine with epi was used for local anesthesia.  The areas were then thoroughly irrigated and explored and no foreign bodies were identified. The patient's wounds were cleaned with saline and gauze. The wound was prepped with betadine for the skin lacerations.   A 4-0 vicryl was used to approximately deep layers of the lacerations, and 5-0 fast gut was used for the superficial layer of the skin and 5-0 chromic gut for the superficial layer of the mucosa.   There were no complications and patient tolerated well.    Studies Reviewed: CT max/face EXAM: CT MAXILLOFACIAL WITHOUT CONTRAST   TECHNIQUE: Multidetector CT imaging of the maxillofacial structures was performed. Multiplanar CT image reconstructions were also generated.   RADIATION DOSE REDUCTION: This exam was performed according to the departmental dose-optimization program which includes automated exposure control, adjustment of the mA and/or kV according to patient size and/or use of iterative reconstruction technique.   COMPARISON:  None Available.   FINDINGS: Osseous: An acute, nondisplaced fracture is seen involving the mandible on the left, just below the region of the mandibular condyle.   Orbits: Negative. No traumatic or inflammatory finding.   Sinuses: Clear.   Soft tissues: Negative.   Limited intracranial: No significant or unexpected finding.   IMPRESSION: Acute, nondisplaced fracture of the mandible on the left  CT head and C-spine  IMPRESSION: 1. No acute cervical spine fracture or subluxation. 2. Acute fracture involving the mandible on the left. 3. Marked severity multilevel degenerative changes, as described above. 4. Moderate to marked severity  emphysematous lung disease.  IMPRESSION: 1. Acute nondisplaced left-sided mandibular fracture. 2. Generalized cerebral atrophy with chronic white matter small vessel ischemic changes. 3. No acute intracranial abnormality.  Assessment/Plan: Non-displaced fracture of the left mandibular condyle Near-edentulous  Facial lacerations and oral laceration  82 year old female nearly edentulous who sustained nondisplaced left mandibular condylar head fracture, currently has no trismus, consumes a soft diet at baseline due to nearly edentulous status and chronic digestive issues from bowel obstruction in the past.   No evidence of upper or lower jaw malalignment on exam with full mouth opening, not a candidate for IMF/MMF fixation which would be a standard approach if she had malocclusion due to lack of dentition to place upper and lower jaw into fixation.  Additionally, if compliant with soft diet due to lack of evidence of malocclusion based on exam and patient report, and non-displaced nature of there left condylar head fracture, the fracture will heal without fixation as long as she is compliant with soft none chew diet and avoids any further trauma.   S/p successful repair of lacerations at bedside.   - Augmentin 875-125 mg BID x 14 days - good oral hygiene -  liquid and soft/non-chew diet  - f/u with me on Tuesday 02/27/23 at [address], [place], [place]  Thank you for allowing me to participate in the care of this patient. Please do not hesitate to contact me with any questions or concerns.   Ashok Croon, MD Otolaryngology [facility] Phone: [phone] Fax: [phone]    02/22/2023, 11:17 PM

## 2023-02-22 NOTE — Progress Notes (Signed)
Orthopedic Tech Progress Note Patient Details:  Terri Harris [birth year] [id number]  Patient ID: Terri Harris, female   DOB: May 22, 1941, 82 y.o.   MRN: [id number] Level II; not currently needed. Terri Harris 02/22/2023, 6:59 PM

## 2023-02-22 NOTE — ED Provider Notes (Signed)
[place] EMERGENCY DEPARTMENT AT [facility] Provider Note   CSN: [id number] Arrival date & time: 02/22/23  1834     History  Chief Complaint  Patient presents with   Trauma    Terri Harris is a 82 y.o. female.  82 year old female with a history of arthritis and chronic back pain who presents emergency department with facial, after a fall.  Per EMS patient fell at home.  Family reported that they thought she was drinking prior to this happening.  Patient does not recall how she fell.  Complaining of facial pain and left hand pain.  EMS noted that several teeth came out.  Patient unsure exactly when this happened.  Does not take aspirin or any blood thinners.       Home Medications Prior to Admission medications   Medication Sig Start Date End Date Taking? Authorizing Provider  amoxicillin-clavulanate (AUGMENTIN) 875-125 MG tablet Take 1 tablet by mouth every 12 (twelve) hours for 14 days. 02/22/23 03/08/23 Yes Rondel Baton, MD  oxyCODONE (ROXICODONE) 5 MG immediate release tablet Take 1 tablet (5 mg total) by mouth every 4 (four) hours as needed for severe pain. 02/22/23  Yes Rondel Baton, MD  acetaminophen (TYLENOL) 325 MG tablet Take 2 tablets (650 mg total) by mouth every 6 (six) hours as needed for mild pain (or Fever >/= 101). 04/03/21   Shon Hale, MD  albuterol (VENTOLIN HFA) 108 (90 Base) MCG/ACT inhaler INHALE 2 PUFFS BY MOUTH EVERY 4 HOURS AS NEEDED FOR WHEEZE OR FOR SHORTNESS OF BREATH 09/12/22   Babs Sciara, MD  alum & mag hydroxide-simeth (MAALOX/MYLANTA) 200-200-20 MG/5ML suspension Take 15 mLs by mouth every 6 (six) hours as needed for indigestion or heartburn.    [provider]  amLODipine (NORVASC) 5 MG tablet Take 1 tablet (5 mg total) by mouth daily. 10/05/22   Babs Sciara, MD  cyanocobalamin 1000 MCG tablet Take 1,000 mcg by mouth daily.    [provider]  oxyCODONE-acetaminophen (PERCOCET/ROXICET) 5-325 MG  tablet 1 pill taken 3 or 4 times daily as needed for pain to last 30 days 01/05/23   Babs Sciara, MD  oxyCODONE-acetaminophen (PERCOCET/ROXICET) 5-325 MG tablet 1 qid prn pain 01/05/23   Babs Sciara, MD  oxyCODONE-acetaminophen (PERCOCET/ROXICET) 5-325 MG tablet 1 qid prn pain 01/05/23   Babs Sciara, MD  pantoprazole (PROTONIX) 40 MG tablet Take 1 tablet (40 mg total) by mouth daily. 10/05/22   Babs Sciara, MD  polyethylene glycol (MIRALAX / GLYCOLAX) 17 g packet Take 17 g by mouth daily. 04/03/21   Shon Hale, MD  rosuvastatin (CRESTOR) 10 MG tablet Take 1 tablet (10 mg total) by mouth daily. 10/05/22   Babs Sciara, MD      Allergies    Codeine and Fosamax [alendronate sodium]    Review of Systems   Review of Systems  Physical Exam Updated Vital Signs BP 139/73   Pulse 84   Temp 97.7 F (36.5 C) (Oral)   Resp (!) 27   Ht 5\' 5"  (1.651 m)   Wt 51.3 kg   LMP  (LMP Unknown)   SpO2 98%   BMI 18.80 kg/m  Physical Exam Constitutional:      General: She is not in acute distress.    Appearance: Normal appearance. She is not ill-appearing.  HENT:     Head: Normocephalic.     Comments: Laceration to right chin approximately 2 cm.  Upper central and  lateral incisors missing.    Right Ear: External ear normal.     Left Ear: External ear normal.     Mouth/Throat:     Mouth: Mucous membranes are moist.     Pharynx: Oropharynx is clear.  Eyes:     Extraocular Movements: Extraocular movements intact.     Conjunctiva/sclera: Conjunctivae normal.     Pupils: Pupils are equal, round, and reactive to light.     Comments: Pupils 2 mm bilaterally  Neck:     Comments: C-collar in place Cardiovascular:     Rate and Rhythm: Normal rate and regular rhythm.     Pulses: Normal pulses.     Heart sounds: Normal heart sounds.  Pulmonary:     Effort: Pulmonary effort is normal. No respiratory distress.     Breath sounds: Normal breath sounds.  Abdominal:     General: Abdomen is  flat. There is no distension.     Palpations: Abdomen is soft. There is no mass.     Tenderness: There is no abdominal tenderness. There is no guarding.  Musculoskeletal:        General: No deformity. Normal range of motion.     Comments: No tenderness to palpation of midline thoracic or lumbar spine.  No step-offs palpated.  No tenderness to palpation of chest wall.  No bruising noted.  No tenderness to palpation of bilateral clavicles.  No tenderness to palpation, bruising, or deformities noted of bilateral shoulders, elbows, wrists, hips, knees, or ankles.  Small abrasion to left hand  Neurological:     General: No focal deficit present.     Mental Status: She is alert. Mental status is at baseline.     Cranial Nerves: No cranial nerve deficit.     Sensory: No sensory deficit.     Motor: No weakness.     ED Results / Procedures / Treatments   Labs (all labs ordered are listed, but only abnormal results are displayed) Labs Reviewed  COMPREHENSIVE METABOLIC PANEL - Abnormal; Notable for the following components:      Result Value   CO2 20 (*)    Glucose, Bld 126 (*)    All other components within normal limits  CBC - Abnormal; Notable for the following components:   MCV 101.6 (*)    All other components within normal limits  ETHANOL - Abnormal; Notable for the following components:   Alcohol, Ethyl (B) 223 (*)    All other components within normal limits  I-STAT CHEM 8, ED - Abnormal; Notable for the following components:   BUN 29 (*)    Creatinine, Ser 1.20 (*)    Glucose, Bld 124 (*)    Calcium, Ion 1.11 (*)    Hemoglobin 15.6 (*)    All other components within normal limits  CK  SAMPLE TO BLOOD BANK    EKG EKG Interpretation Date/Time:  Thursday February 22 2023 19:06:38 EDT Ventricular Rate:  79 PR Interval:  165 QRS Duration:  97 QT Interval:  373 QTC Calculation: 428 R Axis:   -32  Text Interpretation: Sinus rhythm Left axis deviation Confirmed by  Vonita Moss [phone]) on 02/22/2023 7:13:04 PM  Radiology CT Cervical Spine Wo Contrast  Result Date: 02/22/2023 CLINICAL DATA:  Status post fall. EXAM: CT CERVICAL SPINE WITHOUT CONTRAST TECHNIQUE: Multidetector CT imaging of the cervical spine was performed without intravenous contrast. Multiplanar CT image reconstructions were also generated. RADIATION DOSE REDUCTION: This exam was performed according to the departmental dose-optimization program which  includes automated exposure control, adjustment of the mA and/or kV according to patient size and/or use of iterative reconstruction technique. COMPARISON:  None Available. FINDINGS: Alignment: Normal. Skull base and vertebrae: No acute cervical spine fracture. Acute fracture is seen involving the mandible on the left, just below the level of mandibular condyle. Soft tissues and spinal canal: No prevertebral fluid or swelling. No visible canal hematoma. Disc levels: Marked severity endplate sclerosis, anterior osteophyte formation and posterior bony spurring are seen at the levels of C4-C5 and C5-C6. Moderate severity endplate sclerosis is seen at C6-C7, with marked severity posterior bony spurring noted at C3-C4. There is marked severity narrowing of the anterior atlantoaxial articulation. Marked severity intervertebral disc space narrowing is seen at C4-C5 and C5-C6 with moderate severity intervertebral disc space narrowing seen at C6-C7. Bilateral multilevel moderate severity facet joint hypertrophy is noted. Upper chest: There is moderate to marked severity emphysematous lung disease. Other: Marked severity carotid artery calcification is noted. IMPRESSION: 1. No acute cervical spine fracture or subluxation. 2. Acute fracture involving the mandible on the left. 3. Marked severity multilevel degenerative changes, as described above. 4. Moderate to marked severity emphysematous lung disease. Emphysema (ICD10-J43.9). Electronically Signed   By: Aram Candela M.D.   On: 02/22/2023 21:47   CT Maxillofacial Wo Contrast  Result Date: 02/22/2023 CLINICAL DATA:  Status post fall. EXAM: CT MAXILLOFACIAL WITHOUT CONTRAST TECHNIQUE: Multidetector CT imaging of the maxillofacial structures was performed. Multiplanar CT image reconstructions were also generated. RADIATION DOSE REDUCTION: This exam was performed according to the departmental dose-optimization program which includes automated exposure control, adjustment of the mA and/or kV according to patient size and/or use of iterative reconstruction technique. COMPARISON:  None Available. FINDINGS: Osseous: An acute, nondisplaced fracture is seen involving the mandible on the left, just below the region of the mandibular condyle. Orbits: Negative. No traumatic or inflammatory finding. Sinuses: Clear. Soft tissues: Negative. Limited intracranial: No significant or unexpected finding. IMPRESSION: Acute, nondisplaced fracture of the mandible on the left. Electronically Signed   By: Aram Candela M.D.   On: 02/22/2023 21:42   CT Head Wo Contrast  Result Date: 02/22/2023 CLINICAL DATA:  Status post fall. EXAM: CT HEAD WITHOUT CONTRAST TECHNIQUE: Contiguous axial images were obtained from the base of the skull through the vertex without intravenous contrast. RADIATION DOSE REDUCTION: This exam was performed according to the departmental dose-optimization program which includes automated exposure control, adjustment of the mA and/or kV according to patient size and/or use of iterative reconstruction technique. COMPARISON:  November 19, 2005 FINDINGS: Brain: There is mild cerebral atrophy with widening of the extra-axial spaces and ventricular dilatation. There are areas of decreased attenuation within the white matter tracts of the supratentorial brain, consistent with microvascular disease changes. Vascular: No hyperdense vessel or unexpected calcification. Skull: An acute nondisplaced left-sided mandibular fracture  is seen involving the region just below the mandibular condyle. Sinuses/Orbits: No acute finding. Other: None. IMPRESSION: 1. Acute nondisplaced left-sided mandibular fracture. 2. Generalized cerebral atrophy with chronic white matter small vessel ischemic changes. 3. No acute intracranial abnormality. Electronically Signed   By: Aram Candela M.D.   On: 02/22/2023 21:39   DG Pelvis Portable  Result Date: 02/22/2023 CLINICAL DATA:  Status post fall. EXAM: PORTABLE PELVIS 1-2 VIEWS COMPARISON:  January 12, 2023 FINDINGS: There is no evidence of an acute pelvic fracture or diastasis. Moderate severity degenerative changes seen involving both hips in the form of joint space narrowing and acetabular sclerosis. Postoperative changes are  seen throughout the visualized portion of the lumbar spine. IMPRESSION: Moderate severity degenerative changes without evidence of an acute osseous abnormality. Electronically Signed   By: Aram Candela M.D.   On: 02/22/2023 21:34   DG Hand 2 View Left  Result Date: 02/22/2023 CLINICAL DATA:  Status post fall. EXAM: LEFT HAND - 2 VIEW COMPARISON:  None Available. FINDINGS: There is no evidence of an acute fracture or dislocation. Chronic and degenerative changes are seen involving the proximal and medial aspects of the left wrist, with additional degenerative changes seen involving numerous interphalangeal joints. Soft tissues are unremarkable. IMPRESSION: Chronic and degenerative changes without evidence of acute osseous abnormality. Electronically Signed   By: Aram Candela M.D.   On: 02/22/2023 21:32   DG Chest Port 1 View  Result Date: 02/22/2023 CLINICAL DATA:  Status post fall. EXAM: PORTABLE CHEST 1 VIEW COMPARISON:  September 12, 2021 FINDINGS: The heart size and mediastinal contours are within normal limits. There is moderate severity calcification of the aortic arch. Both lungs are clear. Postoperative changes are seen within the lower thoracic and upper  lumbar spine. IMPRESSION: No active cardiopulmonary disease. Electronically Signed   By: Aram Candela M.D.   On: 02/22/2023 21:29    Procedures Procedures    Medications Ordered in ED Medications  fentaNYL (SUBLIMAZE) 50 MCG/ML injection (50 mcg  Given 02/22/23 1845)  Tdap (BOOSTRIX) injection 0.5 mL (0.5 mLs Intramuscular Given 02/22/23 1927)  lidocaine-EPINEPHrine (XYLOCAINE W/EPI) 2 %-1:200000 (PF) injection 10 mL (10 mLs Intradermal Given 02/22/23 1927)  acetaminophen (TYLENOL) tablet 1,000 mg (1,000 mg Oral Given 02/22/23 2003)  fentaNYL (SUBLIMAZE) injection 50 mcg (50 mcg Intravenous Given 02/22/23 2003)  HYDROmorphone (DILAUDID) injection 0.5 mg (0.5 mg Intravenous Given 02/22/23 2106)    ED Course/ Medical Decision Making/ A&P Clinical Course as of 02/23/23 1446  Thu Feb 22, 2023  2105 Dr Irene Pap consulted from ent. Will call back shortly.  [RP]  2206 Dr Irene Pap has evaluated the patient will perform laceration prepare and recommends having the patient on soft diet and following up on Tuesday in clinic.  [RP]    Clinical Course User Index [RP] Rondel Baton, MD                                 Medical Decision Making Amount and/or Complexity of Data Reviewed Labs: ordered. Radiology: ordered.  Risk OTC drugs. Prescription drug management.   Terri Harris is a 82 y.o. female with comorbidities that complicate the patient evaluation including alcohol use who presents to the emergency department after a fall with facial trauma  Initial Ddx:  TBI, concussion, C-spine injury, facial fracture, and fracture  MDM/Course:  Patient presents to the emergency department with facial trauma after a fall.  Appears that the patient may have been drinking prior to this happening.  On arrival was maintaining her airway and did appear to be intoxicated.  Alcohol level came back elevated and I suspect this is the cause of her fall.  She had CT scan of her head, C-spine, and  face which showed a mandibular fracture on the left.  ENT was consulted and came to the bedside and evaluated the patient.  They feel that this will likely be nonoperative and after repairing her lacerations requested that she follow-up with them in clinic and be started on Augmentin.  Upon re-evaluation patient remained stable.  Had abrasion to her hand but no  evidence of fracture.  Not having any snuffbox tenderness that would warrant a thumb spica splint.  Patient discharged home with instructions to follow-up with her primary doctor and ENT.  This patient presents to the ED for concern of complaints listed in HPI, this involves an extensive number of treatment options, and is a complaint that carries with it a high risk of complications and morbidity. Disposition including potential need for admission considered.   Dispo: DC Home. Return precautions discussed including, but not limited to, those listed in the AVS. Allowed pt time to ask questions which were answered fully prior to dc.  Additional history obtained from spouse Records reviewed Outpatient Clinic Notes The following labs were independently interpreted: Chemistry and show no acute abnormality I independently reviewed the following imaging with scope of interpretation limited to determining acute life threatening conditions related to emergency care: CT Head and agree with the radiologist interpretation with the following exceptions: none I personally reviewed and interpreted cardiac monitoring: normal sinus rhythm  I personally reviewed and interpreted the pt's EKG: see above for interpretation  I have reviewed the patients home medications and made adjustments as needed Consults:  ENT Social Determinants of health:  Elderly         Final Clinical Impression(s) / ED Diagnoses Final diagnoses:  Closed subcondylar fracture of left side of mandible, initial encounter (HCC)  Facial injury, initial encounter  Facial  laceration, initial encounter  Alcoholic intoxication without complication (HCC)    Rx / DC Orders ED Discharge Orders          Ordered    amoxicillin-clavulanate (AUGMENTIN) 875-125 MG tablet  Every 12 hours        02/22/23 2235    oxyCODONE (ROXICODONE) 5 MG immediate release tablet  Every 4 hours PRN        02/22/23 2235              Rondel Baton, MD 02/23/23 1446

## 2023-02-22 NOTE — ED Notes (Signed)
X-ray at bedside

## 2023-02-22 NOTE — ED Triage Notes (Signed)
Pt bib [place] [facility] coming from home after a fall. EMS states pt fell into the wall. Denies LOC, pt not on thinners. No pain with palpation to neck. EMS states family says they suspect ETOH being the reason for the fall. EMS states patient knocked out six teeth after fall. Obvious facial trauma noted. It is unknown if patient took any medications today. Pt has hx of HTN, cheonic back pain.   EMS VS: 180/89 86 95% RA 18-20 RR 20 RAC Gcs 15

## 2023-02-27 ENCOUNTER — Ambulatory Visit (INDEPENDENT_AMBULATORY_CARE_PROVIDER_SITE_OTHER): Payer: PPO | Admitting: Otolaryngology

## 2023-02-27 ENCOUNTER — Encounter (INDEPENDENT_AMBULATORY_CARE_PROVIDER_SITE_OTHER): Payer: Self-pay | Admitting: Otolaryngology

## 2023-02-27 VITALS — BP 158/86 | HR 109 | Ht 65.0 in | Wt 109.0 lb

## 2023-02-27 DIAGNOSIS — W19XXXD Unspecified fall, subsequent encounter: Secondary | ICD-10-CM

## 2023-02-27 DIAGNOSIS — S02622A Fracture of subcondylar process of left mandible, initial encounter for closed fracture: Secondary | ICD-10-CM

## 2023-02-27 DIAGNOSIS — B370 Candidal stomatitis: Secondary | ICD-10-CM

## 2023-02-27 DIAGNOSIS — S01511A Laceration without foreign body of lip, initial encounter: Secondary | ICD-10-CM

## 2023-02-27 DIAGNOSIS — S0181XA Laceration without foreign body of other part of head, initial encounter: Secondary | ICD-10-CM

## 2023-02-27 MED ORDER — NYSTATIN 100000 UNIT/ML MT SUSP: 5.0000 mL | Freq: Four times a day (QID) | OROMUCOSAL | 0 refills | Status: DC

## 2023-02-27 NOTE — Progress Notes (Signed)
ENT CONSULT:  Reason for Consult: facial fractures following a fall    HPI: Terri Harris is an 82 y.o. female with hx of a recent fall, 02/22/23, seen in ED as a consult, who is here for evaluation of non-displaced fx of the left condylar head. She is nearly-edentulous, and was not a candidate for IMF/MMF. She had repair of complex laceration of the lower lip in the ED, and repair of the chin/submental area lacerations. All closed with dissolvable sutures. She reports no significant pain. She is able to eat liquid and pureed diet without chewing which is her baseline diet. She had an upper bridge for missing front and lateral dentition along maxilla, which was lost during the fall. She had no other facial fractures on imaging. Taking abx.   Records Reviewed: ED note from 02/22/23 82 year old female with a history of arthritis and chronic back pain who presents emergency department with facial, after a fall.  Per EMS patient fell at home.  Family reported that they thought she was drinking prior to this happening.  Patient does not recall how she fell.  Complaining of facial pain and left hand pain.  EMS noted that several teeth came out.  Patient unsure exactly when this happened.  Does not take aspirin or any blood thinners.         Home Medications        Prior to Admission medications   Medication Sig Start Date End Date Taking? Authorizing Provider  amoxicillin-clavulanate (AUGMENTIN) 875-125 MG tablet Take 1 tablet by mouth every 12 (twelve) hours for 14 days. 02/22/23 03/08/23 Yes Rondel Baton, MD  oxyCODONE (ROXICODONE) 5 MG immediate release tablet Take 1 tablet (5 mg total) by mouth every 4 (four) hours as needed for severe pain. 02/22/23   Yes Rondel Baton, MD  acetaminophen (TYLENOL) 325 MG tablet Take 2 tablets (650 mg total) by mouth every 6 (six) hours as needed for mild pain (or Fever >/= 101). 04/03/21     Shon Hale, MD  albuterol (VENTOLIN HFA) 108 (90 Base)  MCG/ACT inhaler INHALE 2 PUFFS BY MOUTH EVERY 4 HOURS AS NEEDED FOR WHEEZE OR FOR SHORTNESS OF BREATH 09/12/22     Babs Sciara, MD  alum & mag hydroxide-simeth (MAALOX/MYLANTA) 200-200-20 MG/5ML suspension Take 15 mLs by mouth every 6 (six) hours as needed for indigestion or heartburn.       [provider]  amLODipine (NORVASC) 5 MG tablet Take 1 tablet (5 mg total) by mouth daily. 10/05/22     Babs Sciara, MD  cyanocobalamin 1000 MCG tablet Take 1,000 mcg by mouth daily.       [provider]  oxyCODONE-acetaminophen (PERCOCET/ROXICET) 5-325 MG tablet 1 pill taken 3 or 4 times daily as needed for pain to last 30 days 01/05/23     Babs Sciara, MD  oxyCODONE-acetaminophen (PERCOCET/ROXICET) 5-325 MG tablet 1 qid prn pain 01/05/23     Babs Sciara, MD  oxyCODONE-acetaminophen (PERCOCET/ROXICET) 5-325 MG tablet 1 qid prn pain 01/05/23     Babs Sciara, MD  pantoprazole (PROTONIX) 40 MG tablet Take 1 tablet (40 mg total) by mouth daily. 10/05/22     Babs Sciara, MD  polyethylene glycol (MIRALAX / GLYCOLAX) 17 g packet Take 17 g by mouth daily. 04/03/21     Shon Hale, MD  rosuvastatin (CRESTOR) 10 MG tablet Take 1 tablet (10 mg total) by mouth daily. 10/05/22     Babs Sciara,  MD       Allergies            Codeine and Fosamax [alendronate sodium]     Review of Systems   Review of Systems   Physical Exam Updated Vital Signs BP 139/73   Pulse 84   Temp 97.7 F (36.5 C) (Oral)   Resp (!) 27   Ht 5\' 5"  (1.651 m)   Wt 51.3 kg   LMP  (LMP Unknown)   SpO2 98%   BMI 18.80 kg/m  Physical Exam Constitutional:      General: She is not in acute distress.    Appearance: Normal appearance. She is not ill-appearing.  HENT:     Head: Normocephalic.     Comments: Laceration to right chin approximately 2 cm.  Upper central and lateral incisors missing.    Right Ear: External ear normal.     Left Ear: External ear normal.     Mouth/Throat:     Mouth: Mucous  membranes are moist.     Pharynx: Oropharynx is clear.  Eyes:     Extraocular Movements: Extraocular movements intact.     Conjunctiva/sclera: Conjunctivae normal.     Pupils: Pupils are equal, round, and reactive to light.     Comments: Pupils 2 mm bilaterally  Neck:     Comments: C-collar in place Cardiovascular:     Rate and Rhythm: Normal rate and regular rhythm.     Pulses: Normal pulses.     Heart sounds: Normal heart sounds.  Pulmonary:     Effort: Pulmonary effort is normal. No respiratory distress.     Breath sounds: Normal breath sounds.  Abdominal:     General: Abdomen is flat. There is no distension.     Palpations: Abdomen is soft. There is no mass.     Tenderness: There is no abdominal tenderness. There is no guarding.  Musculoskeletal:        General: No deformity. Normal range of motion.     Comments: No tenderness to palpation of midline thoracic or lumbar spine.  No step-offs palpated.   No tenderness to palpation of chest wall.  No bruising noted.  No tenderness to palpation of bilateral clavicles.   No tenderness to palpation, bruising, or deformities noted of bilateral shoulders, elbows, wrists, hips, knees, or ankles.  Small abrasion to left hand  Neurological:     General: No focal deficit present.     Mental Status: She is alert. Mental status is at baseline.     Cranial Nerves: No cranial nerve deficit.     Sensory: No sensory deficit.     Motor: No weakness.        ED Results / Procedures / Treatments   Labs (all labs ordered are listed, but only abnormal results are displayed)      Labs Reviewed  COMPREHENSIVE METABOLIC PANEL - Abnormal; Notable for the following components:      Result Value     CO2 20 (*)      Glucose, Bld 126 (*)      All other components within normal limits  CBC - Abnormal; Notable for the following components:    MCV 101.6 (*)      All other components within normal limits  ETHANOL - Abnormal; Notable for the following  components:    Alcohol, Ethyl (B) 223 (*)      All other components within normal limits  I-STAT CHEM 8, ED - Abnormal; Notable for the following  components:    BUN 29 (*)      Creatinine, Ser 1.20 (*)      Glucose, Bld 124 (*)      Calcium, Ion 1.11 (*)      Hemoglobin 15.6 (*)      All other components within normal limits  CK  SAMPLE TO BLOOD BANK      EKG EKG Interpretation Date/Time:                  Thursday February 22 2023 19:06:38 EDT Ventricular Rate:         79 PR Interval:                 165 QRS Duration:             97 QT Interval:                 373 QTC Calculation:428 R Axis:                         -32   Text Interpretation:Sinus rhythm Left axis deviation Confirmed by Vonita Moss [phone]) on 02/22/2023 7:13:04 PM   Radiology  Imaging Results (Last 48 hours)  CT Cervical Spine Wo Contrast   Result Date: 02/22/2023 CLINICAL DATA:  Status post fall. EXAM: CT CERVICAL SPINE WITHOUT CONTRAST TECHNIQUE: Multidetector CT imaging of the cervical spine was performed without intravenous contrast. Multiplanar CT image reconstructions were also generated. RADIATION DOSE REDUCTION: This exam was performed according to the departmental dose-optimization program which includes automated exposure control, adjustment of the mA and/or kV according to patient size and/or use of iterative reconstruction technique. COMPARISON:  None Available. FINDINGS: Alignment: Normal. Skull base and vertebrae: No acute cervical spine fracture. Acute fracture is seen involving the mandible on the left, just below the level of mandibular condyle. Soft tissues and spinal canal: No prevertebral fluid or swelling. No visible canal hematoma. Disc levels: Marked severity endplate sclerosis, anterior osteophyte formation and posterior bony spurring are seen at the levels of C4-C5 and C5-C6. Moderate severity endplate sclerosis is seen at C6-C7, with marked severity posterior bony spurring noted at C3-C4.  There is marked severity narrowing of the anterior atlantoaxial articulation. Marked severity intervertebral disc space narrowing is seen at C4-C5 and C5-C6 with moderate severity intervertebral disc space narrowing seen at C6-C7. Bilateral multilevel moderate severity facet joint hypertrophy is noted. Upper chest: There is moderate to marked severity emphysematous lung disease. Other: Marked severity carotid artery calcification is noted. IMPRESSION: 1. No acute cervical spine fracture or subluxation. 2. Acute fracture involving the mandible on the left. 3. Marked severity multilevel degenerative changes, as described above. 4. Moderate to marked severity emphysematous lung disease. Emphysema (ICD10-J43.9). Electronically Signed   By: Aram Candela M.D.   On: 02/22/2023 21:47    CT Maxillofacial Wo Contrast   Result Date: 02/22/2023 CLINICAL DATA:  Status post fall. EXAM: CT MAXILLOFACIAL WITHOUT CONTRAST TECHNIQUE: Multidetector CT imaging of the maxillofacial structures was performed. Multiplanar CT image reconstructions were also generated. RADIATION DOSE REDUCTION: This exam was performed according to the departmental dose-optimization program which includes automated exposure control, adjustment of the mA and/or kV according to patient size and/or use of iterative reconstruction technique. COMPARISON:  None Available. FINDINGS: Osseous: An acute, nondisplaced fracture is seen involving the mandible on the left, just below the region of the mandibular condyle. Orbits: Negative. No traumatic or inflammatory finding. Sinuses: Clear. Soft tissues: Negative. Limited intracranial:  No significant or unexpected finding. IMPRESSION: Acute, nondisplaced fracture of the mandible on the left. Electronically Signed   By: Aram Candela M.D.   On: 02/22/2023 21:42    CT Head Wo Contrast   Result Date: 02/22/2023 CLINICAL DATA:  Status post fall. EXAM: CT HEAD WITHOUT CONTRAST TECHNIQUE: Contiguous axial  images were obtained from the base of the skull through the vertex without intravenous contrast. RADIATION DOSE REDUCTION: This exam was performed according to the departmental dose-optimization program which includes automated exposure control, adjustment of the mA and/or kV according to patient size and/or use of iterative reconstruction technique. COMPARISON:  November 19, 2005 FINDINGS: Brain: There is mild cerebral atrophy with widening of the extra-axial spaces and ventricular dilatation. There are areas of decreased attenuation within the white matter tracts of the supratentorial brain, consistent with microvascular disease changes. Vascular: No hyperdense vessel or unexpected calcification. Skull: An acute nondisplaced left-sided mandibular fracture is seen involving the region just below the mandibular condyle. Sinuses/Orbits: No acute finding. Other: None. IMPRESSION: 1. Acute nondisplaced left-sided mandibular fracture. 2. Generalized cerebral atrophy with chronic white matter small vessel ischemic changes. 3. No acute intracranial abnormality. Electronically Signed   By: Aram Candela M.D.   On: 02/22/2023 21:39    DG Pelvis Portable   Result Date: 02/22/2023 CLINICAL DATA:  Status post fall. EXAM: PORTABLE PELVIS 1-2 VIEWS COMPARISON:  January 12, 2023 FINDINGS: There is no evidence of an acute pelvic fracture or diastasis. Moderate severity degenerative changes seen involving both hips in the form of joint space narrowing and acetabular sclerosis. Postoperative changes are seen throughout the visualized portion of the lumbar spine. IMPRESSION: Moderate severity degenerative changes without evidence of an acute osseous abnormality. Electronically Signed   By: Aram Candela M.D.   On: 02/22/2023 21:34    DG Hand 2 View Left   Result Date: 02/22/2023 CLINICAL DATA:  Status post fall. EXAM: LEFT HAND - 2 VIEW COMPARISON:  None Available. FINDINGS: There is no evidence of an acute fracture or  dislocation. Chronic and degenerative changes are seen involving the proximal and medial aspects of the left wrist, with additional degenerative changes seen involving numerous interphalangeal joints. Soft tissues are unremarkable. IMPRESSION: Chronic and degenerative changes without evidence of acute osseous abnormality. Electronically Signed   By: Aram Candela M.D.   On: 02/22/2023 21:32    DG Chest Port 1 View   Result Date: 02/22/2023 CLINICAL DATA:  Status post fall. EXAM: PORTABLE CHEST 1 VIEW COMPARISON:  September 12, 2021 FINDINGS: The heart size and mediastinal contours are within normal limits. There is moderate severity calcification of the aortic arch. Both lungs are clear. Postoperative changes are seen within the lower thoracic and upper lumbar spine. IMPRESSION: No active cardiopulmonary disease. Electronically Signed   By: Aram Candela M.D.   On: 02/22/2023 21:29      Initial Ddx:  TBI, concussion, C-spine injury, facial fracture, and fracture   MDM/Course:  Patient presents to the emergency department with facial trauma after a fall.  Appears that the patient may have been drinking prior to this happening.  On arrival was maintaining her airway and did appear to be intoxicated.  Alcohol level came back elevated and I suspect this is the cause of her fall.  She had CT scan of her head, C-spine, and face which showed a mandibular fracture on the left.  ENT was consulted and came to the bedside and evaluated the patient.  They feel that this will  likely be nonoperative and after repairing her lacerations requested that she follow-up with them in clinic and be started on Augmentin.  Upon re-evaluation patient remained stable.  Had abrasion to her hand but no evidence of fracture.  Not having any snuffbox tenderness that would warrant a thumb spica splint.  Patient discharged home with instructions to follow-up with her primary doctor and ENT.   This patient presents to the ED for  concern of complaints listed in HPI, this involves an extensive number of treatment options, and is a complaint that carries with it a high risk of complications and morbidity. Disposition including potential need for admission considered.    Dispo: DC Home. Return precautions discussed including, but not limited to, those listed in the AVS. Allowed pt time to ask questions which were answered fully prior to dc.   Additional history obtained from spouse Records reviewed Outpatient Clinic Notes The following labs were independently interpreted: Chemistry and show no acute abnormality I independently reviewed the following imaging with scope of interpretation limited to determining acute life threatening conditions related to emergency care: CT Head and agree with the radiologist interpretation with the following exceptions: none I personally reviewed and interpreted cardiac monitoring: normal sinus rhythm  I personally reviewed and interpreted the pt's EKG: see above for interpretation  I have reviewed the patients home medications and made adjustments as needed Consults:  ENT Social Determinants of health:  Elderly                 Final Clinical Impression(s) / ED Diagnoses Final diagnoses:  Closed subcondylar fracture of left side of mandible, initial encounter (HCC)  Facial injury, initial encounter  Facial laceration, initial encounter  Alcoholic intoxication without complication (HCC)      Rx / DC Orders ED Discharge Orders             Ordered      amoxicillin-clavulanate (AUGMENTIN) 875-125 MG tablet  Every 12 hours        02/22/23 2235      oxyCODONE (ROXICODONE) 5 MG immediate release tablet  Every 4 hours PRN        02/22/23 2235                    Rondel Baton, MD 02/23/23 1446              Past Medical History:  Diagnosis Date   ANA positive 01/2001   Arthritis    Arthritis 07/02/2013   ANA positive   Cancer (HCC)    partial hysterectomy  age 73-uterine cancer   Coronary artery calcification 09/28/2021   GERD (gastroesophageal reflux disease)    Hx of tobacco use, presenting hazards to health 07/02/2013   Hypercholesteremia    Hypertension    Ileus, postoperative (HCC) 07/02/2013   Osteoporosis    Pneumonia    hx of   SBO (small bowel obstruction) (HCC) 06/20/2103    Past Surgical History:  Procedure Laterality Date   ABDOMINAL HYSTERECTOMY     ANTERIOR LAT LUMBAR FUSION N/A 06/08/2014   Procedure: LUMBAR 2-3 LUMBAR 3-4 LUMBAR 4-5 ANTEROLATERAL DECOMPRESSION/FUSION WITH PERCUTANEOUS PEDICLE SCREWS;  Surgeon: Barnett Abu, MD;  Location: [facility];  Service: Neurosurgery;  Laterality: N/A;  L2-3 L3-4 L4-5 ANTEROLATERAL DECOMPRESSION/FUSION WITH PERCUTANEOUS PEDICLE SCREWS   ANTERIOR LAT LUMBAR FUSION Left 01/07/2019   Procedure: Lumbar one-two Anterolateral lumbar interbody fusion with lateral plate fixation;  Surgeon: Barnett Abu, MD;  Location: [facility];  Service: Neurosurgery;  Laterality: Left;   BACK SURGERY     BOWEL RESECTION N/A 06/20/2013   Procedure: SMALL BOWEL RESECTION;  Surgeon: Shelly Rubenstein, MD;  Location: [facility];  Service: General;  Laterality: N/A;   COLON SURGERY     COLONOSCOPY  10/25/2011   Procedure: COLONOSCOPY;  Surgeon: Malissa Hippo, MD;  Location: [facility];  Service: Endoscopy;  Laterality: N/A;  930   COLONOSCOPY N/A 05/09/2017   Procedure: COLONOSCOPY;  Surgeon: Malissa Hippo, MD;  Location: [facility];  Service: Endoscopy;  Laterality: N/A;  1030   ESOPHAGEAL DILATION N/A 05/25/2015   Procedure: ESOPHAGEAL DILATION;  Surgeon: Malissa Hippo, MD;  Location: [facility];  Service: Endoscopy;  Laterality: N/A;   ESOPHAGOGASTRODUODENOSCOPY  02/2009   ESOPHAGOGASTRODUODENOSCOPY N/A 05/25/2015   Procedure: ESOPHAGOGASTRODUODENOSCOPY (EGD);  Surgeon: Malissa Hippo, MD;  Location: [facility];  Service: Endoscopy;  Laterality: N/A;   HERNIA REPAIR Left    groin   INGUINAL HERNIA  REPAIR Left 09/11/2013   Procedure: HERNIA REPAIR INGUINAL INCARCERATED;  Surgeon: Shelly Rubenstein, MD;  Location: [facility];  Service: General;  Laterality: Left;   LAPAROTOMY N/A 06/20/2013   Procedure: EXPLORATORY LAPAROTOMY;  Surgeon: Shelly Rubenstein, MD;  Location: [facility];  Service: General;  Laterality: N/A;   LUMBAR PERCUTANEOUS PEDICLE SCREW 3 LEVEL N/A 06/08/2014   Procedure: LUMBAR PERCUTANEOUS PEDICLE SCREW 3 LEVEL;  Surgeon: Barnett Abu, MD;  Location: [facility];  Service: Neurosurgery;  Laterality: N/A;   POLYPECTOMY  05/09/2017   Procedure: POLYPECTOMY;  Surgeon: Malissa Hippo, MD;  Location: [facility];  Service: Endoscopy;;  sigmoid colon   TONSILLECTOMY      Family History  Problem Relation Age of Onset   Hypertension Mother    Heart disease Father    Diabetes Sister    Heart disease Sister    Diabetes Brother    Heart disease Brother    Anesthesia problems Neg Hx    Hypotension Neg Hx    Malignant hyperthermia Neg Hx    Pseudochol deficiency Neg Hx     Social History:  reports that she quit smoking about 11 years ago. Her smoking use included cigarettes. She started smoking about 11 years ago. She has never used smokeless tobacco. She reports that she does not drink alcohol and does not use drugs.  Allergies:  Allergies  Allergen Reactions   Codeine Nausea And Vomiting   Fosamax [Alendronate Sodium] Other (See Comments)    gastritis    Medications: I have reviewed the patient's current medications.  The PMH, PSH, Medications, Allergies, and SH were reviewed and updated.  ROS: Constitutional: Negative for fever, weight loss and weight gain. Cardiovascular: Negative for chest pain and dyspnea on exertion. Respiratory: Is not experiencing shortness of breath at rest. Gastrointestinal: Negative for nausea and vomiting. Neurological: Negative for headaches. Psychiatric: The patient is not nervous/anxious  Blood pressure (!) 158/86, pulse (!) 109,  height 5\' 5"  (1.651 m), weight 109 lb (49.4 kg), SpO2 95%.  PHYSICAL EXAM:  Exam: General: Well-developed, well-nourished Respiratory Respiratory effort: Equal inspiration and expiration without stridor Cardiovascular Peripheral Vascular: Warm extremities with equal color/perfusion Eyes: No nystagmus with equal extraocular motion bilaterally Neuro/Psych/Balance: Patient oriented to person, place, and time; Appropriate mood and affect; Gait is intact with no imbalance; Cranial nerves I-XII are intact Head and Face Inspection: Normocephalic and atraumatic with edema and mild ecchymosis along the lower lip (around the repaired laceration)  Right chin and submental incisions healed at this point with small amount of crusting but otherwise without fluctuance or erythema Facial Strength: Facial motility symmetric and full bilaterally ENT Pinna: External ear intact and fully developed External canal: Canal is patent with intact skin Tympanic Membrane: Clear  External Nose: No scar or anatomic deformity No septal hematoma on anterior rhinoscopy Lips, Teeth, and gums: nearly edentulous, remnant teeth at mandibular symphysis and maxillary molars intact, otherwise rest of the teeth missing.  Repaired laceration of the lower lip and vestibule healing well, appears clean dry and without fluctuance or erythema  TMJ: No pain to palpation with full mobility no step-offs along left TMJ no trismus Oral cavity/oropharynx: No erythema or exudate, no lesions present, white scrapable plaques along the dorsal tongue concerning for thrush Neck Neck and Trachea: Midline trachea without mass or lesion   Procedure: none  Studies Reviewed: CT max/face 02/22/23  FINDINGS: Osseous: An acute, nondisplaced fracture is seen involving the mandible on the left, just below the region of the mandibular condyle.   Orbits: Negative. No traumatic or inflammatory finding.   Sinuses: Clear.   Soft tissues:  Negative.   Limited intracranial: No significant or unexpected finding.   IMPRESSION: Acute, nondisplaced fracture of the mandible on the left.    CT head and neck - no acute findings   Assessment/Plan: Encounter Diagnoses  Name Primary?   Closed subcondylar fracture of left side of mandible, initial encounter (HCC) Yes   Laceration of intraoral surface of lip, initial encounter    Facial laceration, initial encounter    Thrush    Non-displaced fracture of the left condylar head -appears to be doing well on liquids and soft none to diet, no evidence of malocclusion although she is nearly edentulous and I am unable to see what her occlusion will be without a bridge for dental implants to replace missing dentition -she reports that she used to have an upper bridge but it got lost during the fall -Continue Augmentin 875.25 twice daily for a full course of 14 days -Continue liquid and soft pured diet -Good oral hygiene with mouthwash and salt water rinses after meals -Return for follow-up in 2 weeks  2.  Right lower lip laceration and right vestibular laceration of the lower lip status post repair -healing as expected on exam today with mild swelling and ecchymosis after trauma -No evidence of infection on exam -Good oral hygiene and continue antibiotic as above  3.  Oral candidiasis -evidence of nondescript "white plaques along the dorsal tongue will treat with nystatin swish and spit -no pain with swallowing -She only had 3 days of antibiotic and I instructed her to continue as long as her thrush symptoms will not worsen in the next 2 days -Probiotic supplement or yogurt with meals  4.  Near edentulous -I advised the patient to make an appointment with her dentist for restoration and replacement of the dental bridge sometime in November, explained that no major dental work should be done immediately due to the fact that wide mouth opening for prolonged period of time could potentially  compromise healing of nondisplaced left condylar head fracture  She will return to follow-up in 2 weeks   Thank you for allowing me to participate in the care of this patient. Please do not hesitate to contact me with any questions or concerns.   Ashok Croon, MD Otolaryngology [facility] Phone: [phone] Fax: [phone]    02/27/2023, 10:42 AM

## 2023-03-15 ENCOUNTER — Ambulatory Visit (INDEPENDENT_AMBULATORY_CARE_PROVIDER_SITE_OTHER): Payer: PPO | Admitting: Otolaryngology

## 2023-03-15 ENCOUNTER — Encounter (INDEPENDENT_AMBULATORY_CARE_PROVIDER_SITE_OTHER): Payer: Self-pay | Admitting: Otolaryngology

## 2023-03-15 VITALS — BP 144/76 | HR 81 | Ht 66.0 in | Wt 107.6 lb

## 2023-03-15 DIAGNOSIS — S0181XD Laceration without foreign body of other part of head, subsequent encounter: Secondary | ICD-10-CM | POA: Diagnosis not present

## 2023-03-15 DIAGNOSIS — W19XXXD Unspecified fall, subsequent encounter: Secondary | ICD-10-CM | POA: Diagnosis not present

## 2023-03-15 DIAGNOSIS — S02622D Fracture of subcondylar process of left mandible, subsequent encounter for fracture with routine healing: Secondary | ICD-10-CM | POA: Diagnosis not present

## 2023-03-15 DIAGNOSIS — S02622A Fracture of subcondylar process of left mandible, initial encounter for closed fracture: Secondary | ICD-10-CM

## 2023-03-15 DIAGNOSIS — S01511A Laceration without foreign body of lip, initial encounter: Secondary | ICD-10-CM

## 2023-03-15 DIAGNOSIS — S01511D Laceration without foreign body of lip, subsequent encounter: Secondary | ICD-10-CM

## 2023-03-15 DIAGNOSIS — S0181XA Laceration without foreign body of other part of head, initial encounter: Secondary | ICD-10-CM

## 2023-03-15 NOTE — Progress Notes (Signed)
ENT progress note:  Update 03/15/2023 She returns for follow-up.  Now 3 weeks out following a fall which resulted in nondisplaced left condylar head fracture and lip and chin lacerations.  On liquid and soft diet.  Has pain with wider mouth opening but avoids wider mouth opening per instructions.  No swelling no numbness or paresthesia.  Postponed her dentist appointment to ensure that healing prior to dentures or any procedures requiring wide mouth opening.  She took nystatin swish and spit for thrush and finished antibiotics denies pain with swallowing.   Initial evaluation 02/27/2023  Reason for Consult: facial fractures following a fall    HPI: Terri Harris is an 82 y.o. female with hx of a recent fall, 02/22/23, seen in ED as a consult, who is here for evaluation of non-displaced fx of the left condylar head. She is nearly-edentulous, and was not a candidate for IMF/MMF. She had repair of complex laceration of the lower lip in the ED, and repair of the chin/submental area lacerations. All closed with dissolvable sutures. She reports no significant pain. She is able to eat liquid and pureed diet without chewing which is her baseline diet. She had an upper bridge for missing front and lateral dentition along maxilla, which was lost during the fall. She had no other facial fractures on imaging. Taking abx.   Records Reviewed: ED note from 02/22/23 82 year old female with a history of arthritis and chronic back pain who presents emergency department with facial, after a fall.  Per EMS patient fell at home.  Family reported that they thought she was drinking prior to this happening.  Patient does not recall how she fell.  Complaining of facial pain and left hand pain.  EMS noted that several teeth came out.  Patient unsure exactly when this happened.  Does not take aspirin or any blood thinners.         Home Medications        Prior to Admission medications   Medication Sig Start Date End Date  Taking? Authorizing Provider  amoxicillin-clavulanate (AUGMENTIN) 875-125 MG tablet Take 1 tablet by mouth every 12 (twelve) hours for 14 days. 02/22/23 03/08/23 Yes Rondel Baton, MD  oxyCODONE (ROXICODONE) 5 MG immediate release tablet Take 1 tablet (5 mg total) by mouth every 4 (four) hours as needed for severe pain. 02/22/23   Yes Rondel Baton, MD  acetaminophen (TYLENOL) 325 MG tablet Take 2 tablets (650 mg total) by mouth every 6 (six) hours as needed for mild pain (or Fever >/= 101). 04/03/21     Shon Hale, MD  albuterol (VENTOLIN HFA) 108 (90 Base) MCG/ACT inhaler INHALE 2 PUFFS BY MOUTH EVERY 4 HOURS AS NEEDED FOR WHEEZE OR FOR SHORTNESS OF BREATH 09/12/22     Babs Sciara, MD  alum & mag hydroxide-simeth (MAALOX/MYLANTA) 200-200-20 MG/5ML suspension Take 15 mLs by mouth every 6 (six) hours as needed for indigestion or heartburn.       [provider]  amLODipine (NORVASC) 5 MG tablet Take 1 tablet (5 mg total) by mouth daily. 10/05/22     Babs Sciara, MD  cyanocobalamin 1000 MCG tablet Take 1,000 mcg by mouth daily.       [provider]  oxyCODONE-acetaminophen (PERCOCET/ROXICET) 5-325 MG tablet 1 pill taken 3 or 4 times daily as needed for pain to last 30 days 01/05/23     Babs Sciara, MD  oxyCODONE-acetaminophen (PERCOCET/ROXICET) 5-325 MG tablet 1 qid prn pain 01/05/23  Babs Sciara, MD  oxyCODONE-acetaminophen (PERCOCET/ROXICET) 5-325 MG tablet 1 qid prn pain 01/05/23     Babs Sciara, MD  pantoprazole (PROTONIX) 40 MG tablet Take 1 tablet (40 mg total) by mouth daily. 10/05/22     Babs Sciara, MD  polyethylene glycol (MIRALAX / GLYCOLAX) 17 g packet Take 17 g by mouth daily. 04/03/21     Shon Hale, MD  rosuvastatin (CRESTOR) 10 MG tablet Take 1 tablet (10 mg total) by mouth daily. 10/05/22     Babs Sciara, MD       Allergies            Codeine and Fosamax [alendronate sodium]     Review of Systems   Review of Systems    Physical Exam Updated Vital Signs BP 139/73   Pulse 84   Temp 97.7 F (36.5 C) (Oral)   Resp (!) 27   Ht 5\' 5"  (1.651 m)   Wt 51.3 kg   LMP  (LMP Unknown)   SpO2 98%   BMI 18.80 kg/m  Physical Exam Constitutional:      General: She is not in acute distress.    Appearance: Normal appearance. She is not ill-appearing.  HENT:     Head: Normocephalic.     Comments: Laceration to right chin approximately 2 cm.  Upper central and lateral incisors missing.    Right Ear: External ear normal.     Left Ear: External ear normal.     Mouth/Throat:     Mouth: Mucous membranes are moist.     Pharynx: Oropharynx is clear.  Eyes:     Extraocular Movements: Extraocular movements intact.     Conjunctiva/sclera: Conjunctivae normal.     Pupils: Pupils are equal, round, and reactive to light.     Comments: Pupils 2 mm bilaterally  Neck:     Comments: C-collar in place Cardiovascular:     Rate and Rhythm: Normal rate and regular rhythm.     Pulses: Normal pulses.     Heart sounds: Normal heart sounds.  Pulmonary:     Effort: Pulmonary effort is normal. No respiratory distress.     Breath sounds: Normal breath sounds.  Abdominal:     General: Abdomen is flat. There is no distension.     Palpations: Abdomen is soft. There is no mass.     Tenderness: There is no abdominal tenderness. There is no guarding.  Musculoskeletal:        General: No deformity. Normal range of motion.     Comments: No tenderness to palpation of midline thoracic or lumbar spine.  No step-offs palpated.   No tenderness to palpation of chest wall.  No bruising noted.  No tenderness to palpation of bilateral clavicles.   No tenderness to palpation, bruising, or deformities noted of bilateral shoulders, elbows, wrists, hips, knees, or ankles.  Small abrasion to left hand  Neurological:     General: No focal deficit present.     Mental Status: She is alert. Mental status is at baseline.     Cranial Nerves: No cranial  nerve deficit.     Sensory: No sensory deficit.     Motor: No weakness.        ED Results / Procedures / Treatments   Labs (all labs ordered are listed, but only abnormal results are displayed)      Labs Reviewed  COMPREHENSIVE METABOLIC PANEL - Abnormal; Notable for the following components:      Result  Value     CO2 20 (*)      Glucose, Bld 126 (*)      All other components within normal limits  CBC - Abnormal; Notable for the following components:    MCV 101.6 (*)      All other components within normal limits  ETHANOL - Abnormal; Notable for the following components:    Alcohol, Ethyl (B) 223 (*)      All other components within normal limits  I-STAT CHEM 8, ED - Abnormal; Notable for the following components:    BUN 29 (*)      Creatinine, Ser 1.20 (*)      Glucose, Bld 124 (*)      Calcium, Ion 1.11 (*)      Hemoglobin 15.6 (*)      All other components within normal limits  CK  SAMPLE TO BLOOD BANK      EKG EKG Interpretation Date/Time:                  Thursday February 22 2023 19:06:38 EDT Ventricular Rate:         79 PR Interval:                 165 QRS Duration:             97 QT Interval:                 373 QTC Calculation:428 R Axis:                         -32   Text Interpretation:Sinus rhythm Left axis deviation Confirmed by Vonita Moss [phone]) on 02/22/2023 7:13:04 PM   Radiology  Imaging Results (Last 48 hours)  CT Cervical Spine Wo Contrast   Result Date: 02/22/2023 CLINICAL DATA:  Status post fall. EXAM: CT CERVICAL SPINE WITHOUT CONTRAST TECHNIQUE: Multidetector CT imaging of the cervical spine was performed without intravenous contrast. Multiplanar CT image reconstructions were also generated. RADIATION DOSE REDUCTION: This exam was performed according to the departmental dose-optimization program which includes automated exposure control, adjustment of the mA and/or kV according to patient size and/or use of iterative reconstruction  technique. COMPARISON:  None Available. FINDINGS: Alignment: Normal. Skull base and vertebrae: No acute cervical spine fracture. Acute fracture is seen involving the mandible on the left, just below the level of mandibular condyle. Soft tissues and spinal canal: No prevertebral fluid or swelling. No visible canal hematoma. Disc levels: Marked severity endplate sclerosis, anterior osteophyte formation and posterior bony spurring are seen at the levels of C4-C5 and C5-C6. Moderate severity endplate sclerosis is seen at C6-C7, with marked severity posterior bony spurring noted at C3-C4. There is marked severity narrowing of the anterior atlantoaxial articulation. Marked severity intervertebral disc space narrowing is seen at C4-C5 and C5-C6 with moderate severity intervertebral disc space narrowing seen at C6-C7. Bilateral multilevel moderate severity facet joint hypertrophy is noted. Upper chest: There is moderate to marked severity emphysematous lung disease. Other: Marked severity carotid artery calcification is noted. IMPRESSION: 1. No acute cervical spine fracture or subluxation. 2. Acute fracture involving the mandible on the left. 3. Marked severity multilevel degenerative changes, as described above. 4. Moderate to marked severity emphysematous lung disease. Emphysema (ICD10-J43.9). Electronically Signed   By: Aram Candela M.D.   On: 02/22/2023 21:47    CT Maxillofacial Wo Contrast   Result Date: 02/22/2023 CLINICAL DATA:  Status post fall. EXAM: CT MAXILLOFACIAL  WITHOUT CONTRAST TECHNIQUE: Multidetector CT imaging of the maxillofacial structures was performed. Multiplanar CT image reconstructions were also generated. RADIATION DOSE REDUCTION: This exam was performed according to the departmental dose-optimization program which includes automated exposure control, adjustment of the mA and/or kV according to patient size and/or use of iterative reconstruction technique. COMPARISON:  None Available.  FINDINGS: Osseous: An acute, nondisplaced fracture is seen involving the mandible on the left, just below the region of the mandibular condyle. Orbits: Negative. No traumatic or inflammatory finding. Sinuses: Clear. Soft tissues: Negative. Limited intracranial: No significant or unexpected finding. IMPRESSION: Acute, nondisplaced fracture of the mandible on the left. Electronically Signed   By: Aram Candela M.D.   On: 02/22/2023 21:42    CT Head Wo Contrast   Result Date: 02/22/2023 CLINICAL DATA:  Status post fall. EXAM: CT HEAD WITHOUT CONTRAST TECHNIQUE: Contiguous axial images were obtained from the base of the skull through the vertex without intravenous contrast. RADIATION DOSE REDUCTION: This exam was performed according to the departmental dose-optimization program which includes automated exposure control, adjustment of the mA and/or kV according to patient size and/or use of iterative reconstruction technique. COMPARISON:  November 19, 2005 FINDINGS: Brain: There is mild cerebral atrophy with widening of the extra-axial spaces and ventricular dilatation. There are areas of decreased attenuation within the white matter tracts of the supratentorial brain, consistent with microvascular disease changes. Vascular: No hyperdense vessel or unexpected calcification. Skull: An acute nondisplaced left-sided mandibular fracture is seen involving the region just below the mandibular condyle. Sinuses/Orbits: No acute finding. Other: None. IMPRESSION: 1. Acute nondisplaced left-sided mandibular fracture. 2. Generalized cerebral atrophy with chronic white matter small vessel ischemic changes. 3. No acute intracranial abnormality. Electronically Signed   By: Aram Candela M.D.   On: 02/22/2023 21:39    DG Pelvis Portable   Result Date: 02/22/2023 CLINICAL DATA:  Status post fall. EXAM: PORTABLE PELVIS 1-2 VIEWS COMPARISON:  January 12, 2023 FINDINGS: There is no evidence of an acute pelvic fracture or  diastasis. Moderate severity degenerative changes seen involving both hips in the form of joint space narrowing and acetabular sclerosis. Postoperative changes are seen throughout the visualized portion of the lumbar spine. IMPRESSION: Moderate severity degenerative changes without evidence of an acute osseous abnormality. Electronically Signed   By: Aram Candela M.D.   On: 02/22/2023 21:34    DG Hand 2 View Left   Result Date: 02/22/2023 CLINICAL DATA:  Status post fall. EXAM: LEFT HAND - 2 VIEW COMPARISON:  None Available. FINDINGS: There is no evidence of an acute fracture or dislocation. Chronic and degenerative changes are seen involving the proximal and medial aspects of the left wrist, with additional degenerative changes seen involving numerous interphalangeal joints. Soft tissues are unremarkable. IMPRESSION: Chronic and degenerative changes without evidence of acute osseous abnormality. Electronically Signed   By: Aram Candela M.D.   On: 02/22/2023 21:32    DG Chest Port 1 View   Result Date: 02/22/2023 CLINICAL DATA:  Status post fall. EXAM: PORTABLE CHEST 1 VIEW COMPARISON:  September 12, 2021 FINDINGS: The heart size and mediastinal contours are within normal limits. There is moderate severity calcification of the aortic arch. Both lungs are clear. Postoperative changes are seen within the lower thoracic and upper lumbar spine. IMPRESSION: No active cardiopulmonary disease. Electronically Signed   By: Aram Candela M.D.   On: 02/22/2023 21:29      Initial Ddx:  TBI, concussion, C-spine injury, facial fracture, and fracture   MDM/Course:  Patient presents to the emergency department with facial trauma after a fall.  Appears that the patient may have been drinking prior to this happening.  On arrival was maintaining her airway and did appear to be intoxicated.  Alcohol level came back elevated and I suspect this is the cause of her fall.  She had CT scan of her head, C-spine, and  face which showed a mandibular fracture on the left.  ENT was consulted and came to the bedside and evaluated the patient.  They feel that this will likely be nonoperative and after repairing her lacerations requested that she follow-up with them in clinic and be started on Augmentin.  Upon re-evaluation patient remained stable.  Had abrasion to her hand but no evidence of fracture.  Not having any snuffbox tenderness that would warrant a thumb spica splint.  Patient discharged home with instructions to follow-up with her primary doctor and ENT.   This patient presents to the ED for concern of complaints listed in HPI, this involves an extensive number of treatment options, and is a complaint that carries with it a high risk of complications and morbidity. Disposition including potential need for admission considered.    Dispo: DC Home. Return precautions discussed including, but not limited to, those listed in the AVS. Allowed pt time to ask questions which were answered fully prior to dc.   Additional history obtained from spouse Records reviewed Outpatient Clinic Notes The following labs were independently interpreted: Chemistry and show no acute abnormality I independently reviewed the following imaging with scope of interpretation limited to determining acute life threatening conditions related to emergency care: CT Head and agree with the radiologist interpretation with the following exceptions: none I personally reviewed and interpreted cardiac monitoring: normal sinus rhythm  I personally reviewed and interpreted the pt's EKG: see above for interpretation  I have reviewed the patients home medications and made adjustments as needed Consults:  ENT Social Determinants of health:  Elderly                 Final Clinical Impression(s) / ED Diagnoses Final diagnoses:  Closed subcondylar fracture of left side of mandible, initial encounter (HCC)  Facial injury, initial encounter  Facial  laceration, initial encounter  Alcoholic intoxication without complication (HCC)      Rx / DC Orders ED Discharge Orders             Ordered      amoxicillin-clavulanate (AUGMENTIN) 875-125 MG tablet  Every 12 hours        02/22/23 2235      oxyCODONE (ROXICODONE) 5 MG immediate release tablet  Every 4 hours PRN        02/22/23 2235                    Rondel Baton, MD 02/23/23 1446              Past Medical History:  Diagnosis Date   ANA positive 01/2001   Arthritis    Arthritis 07/02/2013   ANA positive   Cancer (HCC)    partial hysterectomy age 69-uterine cancer   Coronary artery calcification 09/28/2021   GERD (gastroesophageal reflux disease)    Hx of tobacco use, presenting hazards to health 07/02/2013   Hypercholesteremia    Hypertension    Ileus, postoperative (HCC) 07/02/2013   Osteoporosis    Pneumonia    hx of   SBO (small bowel obstruction) (HCC) 06/20/2103    Past  Surgical History:  Procedure Laterality Date   ABDOMINAL HYSTERECTOMY     ANTERIOR LAT LUMBAR FUSION N/A 06/08/2014   Procedure: LUMBAR 2-3 LUMBAR 3-4 LUMBAR 4-5 ANTEROLATERAL DECOMPRESSION/FUSION WITH PERCUTANEOUS PEDICLE SCREWS;  Surgeon: Barnett Abu, MD;  Location: [facility];  Service: Neurosurgery;  Laterality: N/A;  L2-3 L3-4 L4-5 ANTEROLATERAL DECOMPRESSION/FUSION WITH PERCUTANEOUS PEDICLE SCREWS   ANTERIOR LAT LUMBAR FUSION Left 01/07/2019   Procedure: Lumbar one-two Anterolateral lumbar interbody fusion with lateral plate fixation;  Surgeon: Barnett Abu, MD;  Location: [facility] OR;  Service: Neurosurgery;  Laterality: Left;   BACK SURGERY     BOWEL RESECTION N/A 06/20/2013   Procedure: SMALL BOWEL RESECTION;  Surgeon: Shelly Rubenstein, MD;  Location: [facility] OR;  Service: General;  Laterality: N/A;   COLON SURGERY     COLONOSCOPY  10/25/2011   Procedure: COLONOSCOPY;  Surgeon: Malissa Hippo, MD;  Location: [facility];  Service: Endoscopy;  Laterality: N/A;  930   COLONOSCOPY N/A  05/09/2017   Procedure: COLONOSCOPY;  Surgeon: Malissa Hippo, MD;  Location: [facility];  Service: Endoscopy;  Laterality: N/A;  1030   ESOPHAGEAL DILATION N/A 05/25/2015   Procedure: ESOPHAGEAL DILATION;  Surgeon: Malissa Hippo, MD;  Location: [facility];  Service: Endoscopy;  Laterality: N/A;   ESOPHAGOGASTRODUODENOSCOPY  02/2009   ESOPHAGOGASTRODUODENOSCOPY N/A 05/25/2015   Procedure: ESOPHAGOGASTRODUODENOSCOPY (EGD);  Surgeon: Malissa Hippo, MD;  Location: [facility];  Service: Endoscopy;  Laterality: N/A;   HERNIA REPAIR Left    groin   INGUINAL HERNIA REPAIR Left 09/11/2013   Procedure: HERNIA REPAIR INGUINAL INCARCERATED;  Surgeon: Shelly Rubenstein, MD;  Location: [facility] OR;  Service: General;  Laterality: Left;   LAPAROTOMY N/A 06/20/2013   Procedure: EXPLORATORY LAPAROTOMY;  Surgeon: Shelly Rubenstein, MD;  Location: [facility] OR;  Service: General;  Laterality: N/A;   LUMBAR PERCUTANEOUS PEDICLE SCREW 3 LEVEL N/A 06/08/2014   Procedure: LUMBAR PERCUTANEOUS PEDICLE SCREW 3 LEVEL;  Surgeon: Barnett Abu, MD;  Location: [facility];  Service: Neurosurgery;  Laterality: N/A;   POLYPECTOMY  05/09/2017   Procedure: POLYPECTOMY;  Surgeon: Malissa Hippo, MD;  Location: [facility];  Service: Endoscopy;;  sigmoid colon   TONSILLECTOMY      Family History  Problem Relation Age of Onset   Hypertension Mother    Heart disease Father    Diabetes Sister    Heart disease Sister    Diabetes Brother    Heart disease Brother    Anesthesia problems Neg Hx    Hypotension Neg Hx    Malignant hyperthermia Neg Hx    Pseudochol deficiency Neg Hx     Social History:  reports that she quit smoking about 11 years ago. Her smoking use included cigarettes. She started smoking about 11 years ago. She has never used smokeless tobacco. She reports that she does not drink alcohol and does not use drugs.  Allergies:  Allergies  Allergen Reactions   Codeine Nausea And Vomiting   Fosamax  [Alendronate Sodium] Other (See Comments)    gastritis    Medications: I have reviewed the patient's current medications.  The PMH, PSH, Medications, Allergies, and SH were reviewed and updated.  ROS: Constitutional: Negative for fever, weight loss and weight gain. Cardiovascular: Negative for chest pain and dyspnea on exertion. Respiratory: Is not experiencing shortness of breath at rest. Gastrointestinal: Negative for nausea and vomiting. Neurological: Negative for headaches. Psychiatric: The patient is not nervous/anxious  Blood pressure Marland Kitchen)  144/76, pulse 81, height 5\' 6"  (1.676 m), weight 107 lb 9.6 oz (48.8 kg), SpO2 94%.  PHYSICAL EXAM:  Exam: General: Well-developed, well-nourished Respiratory Respiratory effort: Equal inspiration and expiration without stridor Cardiovascular Peripheral Vascular: Warm extremities with equal color/perfusion Eyes: No nystagmus with equal extraocular motion bilaterally Neuro/Psych/Balance: Patient oriented to person, place, and time; Appropriate mood and affect; Gait is intact with no imbalance; Cranial nerves I-XII are intact Head and Face Inspection: Normocephalic and atraumatic with edema and mild ecchymosis along the lower lip (around the repaired laceration) Right chin and submental incisions healed at this point with small amount of crusting but otherwise without fluctuance or erythema Facial Strength: Facial motility symmetric and full bilaterally ENT Pinna: External ear intact and fully developed External canal: Canal is patent with intact skin Tympanic Membrane: Clear  External Nose: No scar or anatomic deformity No septal hematoma on anterior rhinoscopy Lips, Teeth, and gums: nearly edentulous, remnant teeth at mandibular symphysis and maxillary molars intact, otherwise rest of the teeth missing.  Repaired laceration of the lower lip and vestibule healed well, without fluctuance or surrounding erythema  TMJ: No pain to palpation  but discomfort with wide mouth opening, no step-offs along left TMJ no trismus Oral cavity/oropharynx: No erythema or exudate, no lesions present, no evidence of persistent thrush Neck Neck and Trachea: Midline trachea without mass or lesion   Procedure: none  Studies Reviewed: CT max/face 02/22/23  FINDINGS: Osseous: An acute, nondisplaced fracture is seen involving the mandible on the left, just below the region of the mandibular condyle.   Orbits: Negative. No traumatic or inflammatory finding.   Sinuses: Clear.   Soft tissues: Negative.   Limited intracranial: No significant or unexpected finding.   IMPRESSION: Acute, nondisplaced fracture of the mandible on the left.    CT head and neck - no acute findings   Assessment/Plan: Encounter Diagnoses  Name Primary?   Closed subcondylar fracture of left side of mandible, initial encounter (HCC) Yes   Laceration of intraoral surface of lip, initial encounter    Facial laceration, initial encounter     Non-displaced fracture of the left condylar head -appears to be doing well on liquids and soft none to diet, no evidence of malocclusion although she is nearly edentulous and I am unable to see what her occlusion will be without a bridge for dental implants to replace missing dentition -she reports that she used to have an upper bridge but it got lost during the fall -Continue Augmentin 875.25 twice daily for a full course of 14 days -Continue liquid and soft pured diet -Good oral hygiene with mouthwash and salt water rinses after meals -Return for follow-up in 2 weeks  2.  Right lower lip laceration and right vestibular laceration of the lower lip status post repair -healing as expected on exam today with mild swelling and ecchymosis after trauma -No evidence of infection on exam -Good oral hygiene and continue antibiotic as above  3.  Oral candidiasis -evidence of nondescript "white plaques along the dorsal tongue will  treat with nystatin swish and spit -no pain with swallowing -She only had 3 days of antibiotic and I instructed her to continue as long as her thrush symptoms will not worsen in the next 2 days -Probiotic supplement or yogurt with meals  4.  Near edentulous -I advised the patient to make an appointment with her dentist for restoration and replacement of the dental bridge sometime in November, explained that no major dental work should  be done immediately due to the fact that wide mouth opening for prolonged period of time could potentially compromise healing of nondisplaced left condylar head fracture  She will return to follow-up in 2 weeks  Update 03/15/23 Non-displaced fracture of the left condylar head -appears to be doing well on liquids and soft none to diet, no evidence of malocclusion although she is nearly edentulous and I am unable to see what her occlusion will be without a bridge for dental implants to replace missing dentition -she reports that she used to have an upper bridge but it got lost during the fall -Continue soft pured diet -Good oral hygiene with mouthwash and salt water rinses after meals   2.  Right lower lip laceration and right vestibular laceration of the lower lip status post repair -healed well on exam today  -No evidence of infection on exam -Good oral hygiene and continue antibiotic as above  3.  Oral candidiasis -resolved after she finished antibiotics and started nystatin. -Okay to discontinue nystatin  3.  Near edentulous -I advised the patient to make an appointment with her dentist for restoration and replacement of the dental bridge sometime in December, explained that no major dental work should be done immediately due to the fact that wide mouth opening for prolonged period of time could potentially compromise healing of nondisplaced left condylar head fracture  I spent 40 minutes in total face-to-face time and in reviewing records during pre-charting,  more than 50% of which was spent in counseling and coordination of care, reviewing test results, reviewing medications and treatment regimen and/or in discussing or reviewing the diagnosis, the prognosis and treatment options. Pertinent laboratory and imaging test results that were available during this visit with the patient were reviewed by me and considered in my medical decision making (see chart for details).    Ashok Croon, MD Otolaryngology [facility] Phone: [phone] Fax: [phone]    03/15/2023, 7:39 PM

## 2023-03-17 ENCOUNTER — Other Ambulatory Visit: Payer: Self-pay | Admitting: Family Medicine

## 2023-04-09 ENCOUNTER — Ambulatory Visit (INDEPENDENT_AMBULATORY_CARE_PROVIDER_SITE_OTHER): Payer: PPO | Admitting: Family Medicine

## 2023-04-09 VITALS — BP 116/68 | HR 76 | Ht 66.0 in | Wt 106.4 lb

## 2023-04-09 DIAGNOSIS — I10 Essential (primary) hypertension: Secondary | ICD-10-CM | POA: Diagnosis not present

## 2023-04-09 DIAGNOSIS — R634 Abnormal weight loss: Secondary | ICD-10-CM

## 2023-04-09 DIAGNOSIS — G8929 Other chronic pain: Secondary | ICD-10-CM | POA: Diagnosis not present

## 2023-04-09 MED ORDER — OXYCODONE-ACETAMINOPHEN 5-325 MG PO TABS: ORAL_TABLET | ORAL | 0 refills | Status: DC

## 2023-04-09 MED ORDER — AMLODIPINE BESYLATE 5 MG PO TABS: 5.0000 mg | ORAL_TABLET | Freq: Every day | ORAL | 1 refills | Status: DC

## 2023-04-09 MED ORDER — ROSUVASTATIN CALCIUM 10 MG PO TABS: 10.0000 mg | ORAL_TABLET | Freq: Every day | ORAL | 1 refills | Status: DC

## 2023-04-09 NOTE — Progress Notes (Signed)
Subjective:    Patient ID: Terri Harris, female    DOB: 08-09-1940, 82 y.o.   MRN: [id number]  Discussed the use of AI scribe software for clinical note transcription with the patient, who gave verbal consent to proceed.  History of Present Illness   The patient, with a history of chronic pain and recent facial trauma, presents with ongoing distress related to interpersonal conflicts and living conditions. She reports a recent fall, during which she knocked out her teeth, and subsequent surgical repair. She describes persistent discomfort and hardness in the area of repair, which she attributes to scar tissue. She expresses a desire to discuss this with the surgeon in the future.  The patient also reports chronic, pervasive pain, which she manages with a regimen of three pain pills at night and one in the morning. She describes feeling cold all the time, even in the summer, and often wears long sleeves and pants to manage this. Despite these challenges, she is making efforts to maintain physical activity, walking three times a day.  The patient's nutritional status is compromised due to her recent dental trauma. She reports difficulty eating and has resorted to a diet of soft foods, including cream potatoes and grits. She is also supplementing her diet with protein-rich drinks 2-3 times a day. Despite these efforts, she has experienced weight loss.  The patient's mental health appears to be significantly impacted by ongoing stressors, including interpersonal conflicts and living conditions. She recently moved out of a stressful living situation and is currently staying with a family member. She reports feeling better in this new environment, but continues to struggle with the emotional fallout of her previous living situation. She denies the need for antidepressant medication at this time.  The patient's blood pressure is managed with regular medication, which she reports taking consistently. She  also reports abstaining from alcohol. Despite the numerous challenges she faces, the patient expresses a desire to maintain her independence and continue to care for herself as much as possible.     This patient was seen today for chronic pain  The medication list was reviewed and updated.   Location of Pain for which the patient has been treated with regarding narcotics: Lumbar area  Onset of this pain: Present for years   -Compliance with medication: Good compliance  - Number patient states they take daily: Anywhere from 2 pills to a maximum of 4 pills/day  -Reason for ongoing use of opioids Tylenol NSAIDs does not give adequate relief  What other measures have been tried outside of opioids Tylenol, NSAIDs, previous surgery  In the ongoing specialists regarding this condition previously with back specialist no longer  -when was the last dose patient took?  Earlier today  The patient was advised the importance of maintaining medication and not using illegal substances with these.  Here for refills and follow up  The patient was educated that we can provide 3 monthly scripts for their medication, it is their responsibility to follow the instructions.  Side effects or complications from medications: No side effects  Patient is aware that pain medications are meant to minimize the severity of the pain to allow their pain levels to improve to allow for better function. They are aware of that pain medications cannot totally remove their pain.  Due for UDT ( at least once per year) (pain management contract is also completed at the time of the UDT): Up-to-date  Scale of 1 to 10 ( 1 is least  10 is most) Your pain level without the medicine: 9 Your pain level with medication 6  Scale 1 to 10 ( 1-helps very little, 10 helps very well) How well does your pain medication reduce your pain so you can function better through out the day?  8  Quality of the pain: Throbbing aching  present  Persistence of the pain: Present all the time  Modifying factors: Worse with activity         Review of Systems     Objective:    Physical Exam         General-in no acute distress Eyes-no discharge Lungs-respiratory rate normal, CTA CV-no murmurs,RRR Extremities skin warm dry no edema Neuro grossly normal Behavior normal, alert       Assessment & Plan:  Assessment and Plan    Chronic Pain Reports constant pain, currently managed with 5mg  Oxycodone. -Adjust Oxycodone schedule to one in the morning, one around supper, and two closer to bedtime for better pain management.  Hypertension Reports regular use of blood pressure medication. -Continue current blood pressure medication.  Weight Loss Reports significant weight loss and difficulty eating due to dental issues. -Encourage high protein and high calorie foods, including Boost 2-3 times a day, peanut butter, and pureed foods. -Goal to gain at least 5 pounds before next visit.  Dental Issues Reports broken dental bridge and hard scar tissue causing discomfort. -Reach out to the doctor who performed the dental procedure to discuss potential interventions. -Consider revision surgery in the new year if scar tissue remains problematic.  Depression Reports significant stress and mood changes, but does not feel the need for medication at this time. -Monitor mood and consider counseling or medication if symptoms worsen.  General Health Maintenance -Order blood work to check protein and thyroid levels. -Continue to avoid alcohol. -Encourage regular walking exercise, as currently doing. -Follow-up in the new year to reassess dental issues and overall health status.      Patient defers on medications for depression  1. Weight loss More than likely related to not enough calories taken in check thyroid function - TSH + free T4  2. Encounter for chronic pain management The patient was seen in followup  for chronic pain. A review over at their current pain status was discussed. Drug registry was checked. Prescriptions were given.  Regular follow-up recommended. Discussion was held regarding the importance of compliance with medication as well as pain medication contract.  Patient was informed that medication may cause drowsiness and should not be combined  with other medications/alcohol or street drugs. If the patient feels medication is causing altered alertness then do not drive or operate dangerous equipment.  Should be noted that the patient appears to be meeting appropriate use of opioids and response.  Evidenced by improved function and decent pain control without significant side effects and no evidence of overt aberrancy issues.  Upon discussion with the patient today they understand that opioid therapy is optional and they feel that the pain has been refractory to reasonable conservative measures and is significant and affecting quality of life enough to warrant ongoing therapy and wishes to continue opioids.  Refills were provided.  [facility] medical Board guidelines regarding the pain medicine has been reviewed.  CDC guidelines most updated 2022 has been reviewed by the prescriber.  PDMP is checked on a regular basis yearly urine drug screen and pain management contract  - Hepatic Function Panel  3. Primary hypertension HTN- patient seen for follow-up regarding  HTN.   Diet, medication compliance, appropriate labs and refills were completed.   Importance of keeping blood pressure under good control to lessen the risk of complications discussed Regular follow-up visits discussed  - Basic Metabolic Panel - CBC with Differential

## 2023-04-10 ENCOUNTER — Telehealth: Payer: Self-pay | Admitting: Family Medicine

## 2023-04-10 LAB — BASIC METABOLIC PANEL
BUN/Creatinine Ratio: 29 — ABNORMAL HIGH (ref 12–28)
BUN: 17 mg/dL (ref 8–27)
CO2: 23 mmol/L (ref 20–29)
Calcium: 10 mg/dL (ref 8.7–10.3)
Chloride: 101 mmol/L (ref 96–106)
Creatinine, Ser: 0.58 mg/dL (ref 0.57–1.00)
Glucose: 98 mg/dL (ref 70–99)
Potassium: 3.9 mmol/L (ref 3.5–5.2)
Sodium: 141 mmol/L (ref 134–144)
eGFR: 91 mL/min/{1.73_m2} (ref >59)

## 2023-04-10 LAB — HEPATIC FUNCTION PANEL
ALT: 15 [IU]/L (ref 0–32)
AST: 28 [IU]/L (ref 0–40)
Albumin: 5 g/dL — ABNORMAL HIGH (ref 3.7–4.7)
Alkaline Phosphatase: 68 [IU]/L (ref 44–121)
Bilirubin Total: 0.5 mg/dL (ref 0.0–1.2)
Bilirubin, Direct: 0.15 mg/dL (ref 0.00–0.40)
Total Protein: 7.9 g/dL (ref 6.0–8.5)

## 2023-04-10 LAB — CBC WITH DIFFERENTIAL/PLATELET
Basophils Absolute: 0 10*3/uL (ref 0.0–0.2)
Basos: 1 %
EOS (ABSOLUTE): 0.1 10*3/uL (ref 0.0–0.4)
Eos: 2 %
Hematocrit: 45.8 % (ref 34.0–46.6)
Hemoglobin: 15.5 g/dL (ref 11.1–15.9)
Immature Grans (Abs): 0 10*3/uL (ref 0.0–0.1)
Immature Granulocytes: 0 %
Lymphocytes Absolute: 1.3 10*3/uL (ref 0.7–3.1)
Lymphs: 16 %
MCH: 34.1 pg — ABNORMAL HIGH (ref 26.6–33.0)
MCHC: 33.8 g/dL (ref 31.5–35.7)
MCV: 101 fL — ABNORMAL HIGH (ref 79–97)
Monocytes Absolute: 0.6 10*3/uL (ref 0.1–0.9)
Monocytes: 7 %
Neutrophils Absolute: 5.9 10*3/uL (ref 1.4–7.0)
Neutrophils: 74 %
Platelets: 293 10*3/uL (ref 150–450)
RBC: 4.55 x10E6/uL (ref 3.77–5.28)
RDW: 11.8 % (ref 11.7–15.4)
WBC: 8 10*3/uL (ref 3.4–10.8)

## 2023-04-10 LAB — TSH+FREE T4
Free T4: 1.08 ng/dL (ref 0.82–1.77)
TSH: 0.648 u[IU]/mL (ref 0.450–4.500)

## 2023-04-10 NOTE — Telephone Encounter (Signed)
Patient was just seen for pain management visit.  She was instructed to follow-up in 3 months.  It appears that her follow-up appointment was made with Dr. Adriana Simas.  This should be made for me not Dr. Van Clines correct and notify patient thank you

## 2023-05-13 ENCOUNTER — Encounter: Payer: Self-pay | Admitting: Family Medicine

## 2023-05-13 NOTE — Progress Notes (Signed)
Please mail to patient thank you ?

## 2023-05-16 ENCOUNTER — Telehealth: Payer: Self-pay | Admitting: Family Medicine

## 2023-05-16 NOTE — Telephone Encounter (Signed)
Estanislado Emms nurse with infusion has stated that the patient needs new order  Good morning, this pt is due for her yearly reclast infusion 12/26. Could you fax [place] a new order please? [phone]   Will research and handle

## 2023-05-21 NOTE — Telephone Encounter (Signed)
Please fax order. thank you!

## 2023-05-21 NOTE — Telephone Encounter (Signed)
Order faxed to infusion center.

## 2023-05-23 ENCOUNTER — Other Ambulatory Visit: Payer: Self-pay

## 2023-05-23 ENCOUNTER — Encounter (HOSPITAL_COMMUNITY): Payer: Self-pay

## 2023-05-23 ENCOUNTER — Emergency Department (HOSPITAL_COMMUNITY): Payer: PPO

## 2023-05-23 ENCOUNTER — Emergency Department (HOSPITAL_COMMUNITY)
Admission: EM | Admit: 2023-05-23 | Discharge: 2023-05-23 | Disposition: A | Discharge status: Home / Self Care | Payer: PPO | Attending: Emergency Medicine | Admitting: Emergency Medicine

## 2023-05-23 DIAGNOSIS — W19XXXA Unspecified fall, initial encounter: Secondary | ICD-10-CM | POA: Insufficient documentation

## 2023-05-23 DIAGNOSIS — S60221A Contusion of right hand, initial encounter: Secondary | ICD-10-CM | POA: Diagnosis not present

## 2023-05-23 DIAGNOSIS — M79641 Pain in right hand: Secondary | ICD-10-CM | POA: Diagnosis not present

## 2023-05-23 DIAGNOSIS — M19041 Primary osteoarthritis, right hand: Secondary | ICD-10-CM | POA: Diagnosis not present

## 2023-05-23 MED ORDER — OXYCODONE-ACETAMINOPHEN 5-325 MG PO TABS: 1.0000 | ORAL_TABLET | Freq: Four times a day (QID) | ORAL | 0 refills | Status: DC | PRN

## 2023-05-23 MED ORDER — CEPHALEXIN 500 MG PO CAPS: 500.0000 mg | ORAL_CAPSULE | Freq: Four times a day (QID) | ORAL | 0 refills | Status: DC

## 2023-05-23 NOTE — ED Provider Notes (Signed)
[place] EMERGENCY DEPARTMENT AT [facility] Provider Note   CSN: [id number] Arrival date & time: 05/23/23  0865     History  Chief Complaint  Patient presents with   Hand Injury    Terri Harris is a 82 y.o. female.  Patient fell couple days ago and hit her right elbow and now has swelling and pain to her right hand  The history is provided by the patient and medical records. No language interpreter was used.  Hand Injury Location:  Hand Hand location:  R hand Pain details:    Quality:  Aching   Radiates to:  Does not radiate   Severity:  Moderate   Onset quality:  Sudden   Timing:  Constant   Progression:  Worsening Handedness:  Right-handed Dislocation: no   Associated symptoms: no back pain and no fatigue        Home Medications Prior to Admission medications   Medication Sig Start Date End Date Taking? Authorizing Provider  cephALEXin (KEFLEX) 500 MG capsule Take 1 capsule (500 mg total) by mouth 4 (four) times daily. 05/23/23  Yes Bethann Berkshire, MD  oxyCODONE-acetaminophen (PERCOCET/ROXICET) 5-325 MG tablet Take 1 tablet by mouth every 6 (six) hours as needed for severe pain (pain score 7-10). 05/23/23  Yes Bethann Berkshire, MD  acetaminophen (TYLENOL) 325 MG tablet Take 2 tablets (650 mg total) by mouth every 6 (six) hours as needed for mild pain (or Fever >/= 101). 04/03/21   Shon Hale, MD  albuterol (VENTOLIN HFA) 108 (90 Base) MCG/ACT inhaler INHALE 2 PUFFS BY MOUTH EVERY 4 HOURS AS NEEDED FOR WHEEZE OR FOR SHORTNESS OF BREATH 09/12/22   Babs Sciara, MD  alum & mag hydroxide-simeth (MAALOX/MYLANTA) 200-200-20 MG/5ML suspension Take 15 mLs by mouth every 6 (six) hours as needed for indigestion or heartburn.    [provider]  amLODipine (NORVASC) 5 MG tablet Take 1 tablet (5 mg total) by mouth daily. 04/09/23   Babs Sciara, MD  cyanocobalamin 1000 MCG tablet Take 1,000 mcg by mouth daily.    [provider]  oxyCODONE  (ROXICODONE) 5 MG immediate release tablet Take 1 tablet (5 mg total) by mouth every 4 (four) hours as needed for severe pain. 02/22/23   Rondel Baton, MD  pantoprazole (PROTONIX) 40 MG tablet TAKE 1 TABLET (40 MG TOTAL) BY MOUTH TWICE A DAY BEFORE MEALS 03/20/23   Luking, Jonna Coup, MD  polyethylene glycol (MIRALAX / GLYCOLAX) 17 g packet Take 17 g by mouth daily. 04/03/21   Shon Hale, MD  rosuvastatin (CRESTOR) 10 MG tablet Take 1 tablet (10 mg total) by mouth daily. 04/09/23   Babs Sciara, MD      Allergies    Codeine and Fosamax [alendronate sodium]    Review of Systems   Review of Systems  Constitutional:  Negative for appetite change and fatigue.  HENT:  Negative for congestion, ear discharge and sinus pressure.   Eyes:  Negative for discharge.  Respiratory:  Negative for cough.   Cardiovascular:  Negative for chest pain.  Gastrointestinal:  Negative for abdominal pain and diarrhea.  Genitourinary:  Negative for frequency and hematuria.  Musculoskeletal:  Negative for back pain.       Pain in her right hand  Skin:  Negative for rash.  Neurological:  Negative for seizures and headaches.  Psychiatric/Behavioral:  Negative for hallucinations.     Physical Exam Updated Vital Signs BP (!) 159/78 (BP Location: Left Arm)  Pulse 66   Temp 97.6 F (36.4 C) (Oral)   Resp 18   LMP  (LMP Unknown)   SpO2 97%  Physical Exam Vitals and nursing note reviewed.  Constitutional:      Appearance: She is well-developed.  HENT:     Head: Normocephalic.     Nose: Nose normal.  Eyes:     General: No scleral icterus.    Conjunctiva/sclera: Conjunctivae normal.  Neck:     Thyroid: No thyromegaly.  Cardiovascular:     Rate and Rhythm: Normal rate and regular rhythm.     Heart sounds: No murmur heard.    No friction rub. No gallop.  Pulmonary:     Breath sounds: No stridor. No wheezing or rales.  Chest:     Chest wall: No tenderness.  Abdominal:     General: There is no  distension.     Tenderness: There is no abdominal tenderness. There is no rebound.  Musculoskeletal:     Cervical back: Neck supple.     Comments: Tender swollen right hand  Lymphadenopathy:     Cervical: No cervical adenopathy.  Skin:    Findings: No erythema or rash.  Neurological:     Mental Status: She is alert and oriented to person, place, and time.     Motor: No abnormal muscle tone.     Coordination: Coordination normal.  Psychiatric:        Behavior: Behavior normal.     ED Results / Procedures / Treatments   Labs (all labs ordered are listed, but only abnormal results are displayed) Labs Reviewed - No data to display  EKG None  Radiology DG Hand Complete Right Result Date: 05/23/2023 CLINICAL DATA:  82 year old female with history of right hand pain. EXAM: RIGHT HAND - COMPLETE 3+ VIEW COMPARISON:  No priors. FINDINGS: No acute displaced fracture or dislocation. Multifocal joint space narrowing, subchondral sclerosis, subchondral cyst formation and osteophyte formation, most pronounced throughout the DIP and PIP joints, as well as in the carpals, indicative of advanced osteoarthritis. IMPRESSION: 1. No acute radiographic abnormality of the right hand. 2. Severe osteoarthritis, as above. Electronically Signed   By: Trudie Reed M.D.   On: 05/23/2023 10:10    Procedures Procedures    Medications Ordered in ED Medications - No data to display  ED Course/ Medical Decision Making/ A&P                                 Medical Decision Making Amount and/or Complexity of Data Reviewed Radiology: ordered.  Risk Prescription drug management.   Contusion to right hand.  Patient is given pain medicine and antibiotics and will follow-up with her family doctor next week.  She has severe osteoarthritis of the hand        Final Clinical Impression(s) / ED Diagnoses Final diagnoses:  Contusion of right hand, initial encounter    Rx / DC Orders ED Discharge  Orders          Ordered    oxyCODONE-acetaminophen (PERCOCET/ROXICET) 5-325 MG tablet  Every 6 hours PRN        05/23/23 1148    cephALEXin (KEFLEX) 500 MG capsule  4 times daily        05/23/23 1148              Bethann Berkshire, MD 05/25/23 1043

## 2023-05-23 NOTE — ED Triage Notes (Signed)
Pt c/o right hand swelling that started last night, had a recent fall where she hit her elbow but does not remember hitting her hand.

## 2023-05-23 NOTE — Discharge Instructions (Signed)
Keep your hand elevated.  Follow-up with your doctor next week.  If he gets worse over the next couple days then you should see someone the end of this week

## 2023-05-24 ENCOUNTER — Ambulatory Visit: Payer: PPO

## 2023-05-24 ENCOUNTER — Other Ambulatory Visit: Payer: Self-pay

## 2023-05-24 ENCOUNTER — Telehealth: Payer: Self-pay

## 2023-05-24 ENCOUNTER — Telehealth: Payer: Self-pay | Admitting: Family Medicine

## 2023-05-24 ENCOUNTER — Inpatient Hospital Stay (HOSPITAL_COMMUNITY): Admission: RE | Admit: 2023-05-24 | Payer: PPO | Source: Ambulatory Visit

## 2023-05-24 NOTE — Telephone Encounter (Signed)
Auth Submission: NO AUTH NEEDED Site of care: Site of care: [facility] Payer: [employer] Medication & CPT/J Code(s) submitted: Reclast (Zolendronic acid) [id number] Route of submission (phone, fax, portal): phone Phone # Fax # Auth type: Buy/Bill PB Units/visits requested: 1 dose Reference number:  Approval from: 05/24/23 to 05/29/23

## 2023-05-24 NOTE — Telephone Encounter (Unsigned)
Copied from CRM [id number]. Topic: Appointments - Appointment Scheduling >> May 24, 2023  9:29 AM Fuller Canada P wrote: Patient/patient representative is calling to schedule an appointment. Patient is looking to be seen as soon as possible was in hospital yesterday and swelling on the hand and wrist is getting worse. Requesting call back at [phone].

## 2023-05-29 ENCOUNTER — Inpatient Hospital Stay: Payer: PPO | Admitting: Family Medicine

## 2023-06-11 ENCOUNTER — Encounter: Payer: PPO | Attending: Family Medicine | Admitting: Internal Medicine

## 2023-06-11 VITALS — BP 119/60 | HR 70 | Temp 97.3°F | Resp 16

## 2023-06-11 DIAGNOSIS — M810 Age-related osteoporosis without current pathological fracture: Secondary | ICD-10-CM

## 2023-06-11 MED ORDER — ACETAMINOPHEN 325 MG PO TABS
650.0000 mg | ORAL_TABLET | Freq: Once | ORAL | Status: DC
Start: 2023-06-11 — End: 2023-06-11

## 2023-06-11 MED ORDER — DIPHENHYDRAMINE HCL 25 MG PO CAPS: 25.0000 mg | ORAL_CAPSULE | Freq: Once | ORAL | Status: DC

## 2023-06-11 MED ORDER — ZOLEDRONIC ACID 5 MG/100ML IV SOLN
5.0000 mg | Freq: Once | INTRAVENOUS | Status: AC
  Administered 2023-06-11: 5 mg via INTRAVENOUS

## 2023-06-11 NOTE — Progress Notes (Signed)
 Diagnosis: Osteoporosis  Provider:  Alphonsa Hamilton MD  Procedure: IV Infusion  IV Type: Peripheral, IV Location: L Antecubital  Reclast  (Zolendronic Acid), Dose: 5 mg  Infusion Start Time: 1331  Infusion Stop Time: 1405  Post Infusion IV Care: Observation period completed  Discharge: Condition: Good, Destination: Home . AVS Provided  Performed by:  Blanca Selinda SAUNDERS, LPN

## 2023-07-10 ENCOUNTER — Ambulatory Visit: Payer: PPO | Admitting: Family Medicine

## 2023-07-10 ENCOUNTER — Encounter: Payer: Self-pay | Admitting: Family Medicine

## 2023-07-10 VITALS — BP 134/72 | HR 76 | Temp 97.8°F | Ht 66.0 in | Wt 110.0 lb

## 2023-07-10 DIAGNOSIS — G8929 Other chronic pain: Secondary | ICD-10-CM | POA: Diagnosis not present

## 2023-07-10 DIAGNOSIS — I10 Essential (primary) hypertension: Secondary | ICD-10-CM | POA: Diagnosis not present

## 2023-07-10 MED ORDER — OXYCODONE-ACETAMINOPHEN 5-325 MG PO TABS: ORAL_TABLET | ORAL | 0 refills | Status: DC

## 2023-07-10 NOTE — Progress Notes (Signed)
Subjective:    Patient ID: Terri Harris, female    DOB: 1940-10-13, 83 y.o.   MRN: [id number]  Discussed the use of AI scribe software for clinical note transcription with the patient, who gave verbal consent to proceed. This patient was seen today for chronic pain  The medication list was reviewed and updated.   Location of Pain for which the patient has been treated with regarding narcotics: Lower back mid back  Onset of this pain: Present for years   -Compliance with medication: Good compliance  - Number patient states they take daily: Typically 4/day  -Reason for ongoing use of opioids cannot get adequate relief with Tylenol or NSAIDs  What other measures have been tried outside of opioids Tylenol NSAIDs surgery and injections  In the ongoing specialists regarding this condition has seen back specialist  -when was the last dose patient took?  Earlier today  The patient was advised the importance of maintaining medication and not using illegal substances with these.  Here for refills and follow up  The patient was educated that we can provide 3 monthly scripts for their medication, it is their responsibility to follow the instructions.  Side effects or complications from medications: Denies side effects  Patient is aware that pain medications are meant to minimize the severity of the pain to allow their pain levels to improve to allow for better function. They are aware of that pain medications cannot totally remove their pain.  Due for UDT ( at least once per year) (pain management contract is also completed at the time of the UDT): Up-to-date  Scale of 1 to 10 ( 1 is least 10 is most) Your pain level without the medicine: 9 Your pain level with medication 6  Scale 1 to 10 ( 1-helps very little, 10 helps very well) How well does your pain medication reduce your pain so you can function better through out the day?  8  Quality of the pain: Throbbing  aching  Persistence of the pain: Present all the time  Modifying factors: Worse with activity      History of Present Illness   Terri Harris is an 83 year old female who presents with lower back pain.  She experiences significant lower back pain localized to the lower back without radiation to the legs. The pain is alleviated when she lies flat on her back or moves around. She had previously received an injection for upper back pain, which was effective, and had planned to receive another injection for her lower back pain but fell and has yet to reschedule the appointment.  She manages her chronic pain with oxycodone, taking approximately four pills a day. She typically takes one pill around 5:30 to 6:00 PM, another around midnight, and then one upon waking around 5:00 AM, with one pill left for use during the day if needed. She saves her medication for nighttime due to difficulty sleeping caused by pain.  She recounts an incident on Christmas where her hand swelled after being hit by a storm door, leading to a hospital visit where she was given antibiotics. She still experiences numbness in her thumb, particularly on one side, which she attributes to possible nerve damage.  Her appetite is 'okay,' and she is trying to eat well, consuming two to three meals a day with snacks in between. Her weight is stable at 110 pounds, and she is aware of the importance of maintaining her weight above 100 pounds. She experiences  shortness of breath with exertion but finds comfort in resting until she feels comfortable. Her bowel movements are regular without any blood, and she denies any episodes of vomiting. She received a flu shot in the fall and recalls her last pneumonia booster was in 2015.         Review of Systems     Objective:    Physical Exam   MEASUREMENTS: WT- 110 EXTREMITIES: Right hand edema. Decreased sensation on the radial aspect of the right thumb.   Subjective discomfort in her  lower back negative straight leg raise       Assessment & Plan:  Assessment and Plan    Lower Back Pain Pain localized to the lower back, not radiating down the legs. Pain is relieved by laying flat or moving around. Previous injection provided relief. -Plan to schedule another injection with Dr. Everardo All.  Chronic Pain Management Taking Oxycodone approximately four times a day, with one dose saved for nighttime due to difficulty sleeping from pain. -Continue current regimen of Oxycodone.  General Health Maintenance Last pneumonia vaccine was in 2015. -Recommend getting a Pneumococcal 20 vaccine.  Possible Nerve Damage Numbness in thumb and two fingers following a hit to the arm. Likely nerve damage. -No immediate plan discussed in the conversation.  Influenza Prevention Patient has received a flu shot this season. -Continue annual flu vaccinations.      The patient was seen in followup for chronic pain. A review over at their current pain status was discussed. Drug registry was checked. Prescriptions were given.  Regular follow-up recommended. Discussion was held regarding the importance of compliance with medication as well as pain medication contract.  Patient was informed that medication may cause drowsiness and should not be combined  with other medications/alcohol or street drugs. If the patient feels medication is causing altered alertness then do not drive or operate dangerous equipment.  Should be noted that the patient appears to be meeting appropriate use of opioids and response.  Evidenced by improved function and decent pain control without significant side effects and no evidence of overt aberrancy issues.  Upon discussion with the patient today they understand that opioid therapy is optional and they feel that the pain has been refractory to reasonable conservative measures and is significant and affecting quality of life enough to warrant ongoing therapy and wishes  to continue opioids.  Refills were provided.  [facility] medical Board guidelines regarding the pain medicine has been reviewed.  CDC guidelines most updated 2022 has been reviewed by the prescriber.  PDMP is checked on a regular basis yearly urine drug screen and pain management contract

## 2023-10-01 ENCOUNTER — Encounter: Payer: Self-pay | Admitting: Family Medicine

## 2023-10-01 ENCOUNTER — Ambulatory Visit (INDEPENDENT_AMBULATORY_CARE_PROVIDER_SITE_OTHER): Payer: PPO | Admitting: Family Medicine

## 2023-10-01 VITALS — BP 146/83 | HR 64 | Temp 98.4°F | Ht 66.0 in | Wt 110.0 lb

## 2023-10-01 DIAGNOSIS — E538 Deficiency of other specified B group vitamins: Secondary | ICD-10-CM | POA: Diagnosis not present

## 2023-10-01 DIAGNOSIS — E559 Vitamin D deficiency, unspecified: Secondary | ICD-10-CM

## 2023-10-01 DIAGNOSIS — G8929 Other chronic pain: Secondary | ICD-10-CM

## 2023-10-01 DIAGNOSIS — I10 Essential (primary) hypertension: Secondary | ICD-10-CM | POA: Diagnosis not present

## 2023-10-01 MED ORDER — OXYCODONE-ACETAMINOPHEN 5-325 MG PO TABS
ORAL_TABLET | ORAL | 0 refills | Status: AC
Start: 2023-10-01 — End: ?

## 2023-10-01 MED ORDER — OXYCODONE-ACETAMINOPHEN 5-325 MG PO TABS: ORAL_TABLET | ORAL | 0 refills | Status: DC

## 2023-10-01 NOTE — Progress Notes (Signed)
 Subjective:    Patient ID: Terri Harris, female    DOB: 1940-10-17, 82 y.o.   MRN: [id number]  HPI This patient was seen today for chronic pain  The medication list was reviewed and updated.   Location of Pain for which the patient has been treated with regarding narcotics: Mainly in the low back has had previous surgery  Onset of this pain: Present for years   -Compliance with medication: 100%  - Number patient states they take daily: 4  -Reason for ongoing use of opioids pain   What other measures have been tried outside of opioids Tylenol   In the ongoing specialists regarding this condition none  -when was the last dose patient took? 9AM 10/01/23  The patient was advised the importance of maintaining medication and not using illegal substances with these.  Here for refills and follow up  The patient was educated that we can provide 3 monthly scripts for their medication, it is their responsibility to follow the instructions.  Side effects or complications from medications: Denies side effects  Patient is aware that pain medications are meant to minimize the severity of the pain to allow their pain levels to improve to allow for better function. They are aware of that pain medications cannot totally remove their pain.  Due for UDT ( at least once per year) (pain management contract is also completed at the time of the UDT): 01/05/2023  Scale of 1 to 10 ( 1 is least 10 is most) Your pain level without the medicine: 10 Your pain level with medication 6  Scale 1 to 10 ( 1-helps very little, 10 helps very well) How well does your pain medication reduce your pain so you can function better through out the day? 5  Quality of the pain: Throbbing aching  Persistence of the pain: Present all time  Modifying factors: Worse with activity    Patient does not desire to go up on pain medicine would like to stay where she is at  Discussed the use of AI scribe software for  clinical note transcription with the patient, who gave verbal consent to proceed.  History of Present Illness   Terri Harris is an 83 year old female who presents with fatigue and weakness.  She experiences significant fatigue and weakness, particularly in her legs, with an unspecified duration. She becomes easily winded and has difficulty breathing with exertion, such as walking around her house. She can sometimes walk four to five times around her house but often needs to rest after two or three laps due to fatigue and shortness of breath. No chest pain, fever, chills, or significant cough. She uses an inhaler, particularly in the spring when mowing the lawn, as she experiences difficulty breathing after exertion.  She describes a loss of appetite and mentions losing a pound or two, which she attributes to the recent loss of her sister. She acknowledges feeling depressed after her sister's passing but states 'life goes on'.  She experiences chronic low back pain, which she attributes to arthritis. The pain can be severe enough to wake her at night, particularly in her hip. She takes pain medication four times a day, which provides variable relief. She typically takes one dose in the morning and another after breakfast, with the remaining doses spread throughout the day.         Review of Systems     Objective:   Physical Exam General-in no acute distress Eyes-no discharge Lungs-respiratory rate  normal, CTA CV-no murmurs,RRR Extremities skin warm dry no edema Neuro grossly normal Behavior normal, alert        Assessment & Plan:  Assessment and Plan    Chronic pain due to arthritis Chronic low back and hip pain due to arthritis, variable relief with current medication. - Continue current pain medication regimen, up to four doses per day as needed.  Dyspnea on exertion Dyspnea on exertion, possibly due to deconditioning. - Encourage regular, paced physical activity as  tolerated. - Consider further evaluation if symptoms persist or worsen.      1. Primary hypertension (Primary) BP check level BP looks good today await lab results healthy diet - Basic metabolic panel with GFR  2. Encounter for chronic pain management The patient was seen in followup for chronic pain. A review over at their current pain status was discussed. Drug registry was checked. Prescriptions were given.  Regular follow-up recommended. Discussion was held regarding the importance of compliance with medication as well as pain medication contract.  Patient was informed that medication may cause drowsiness and should not be combined  with other medications/alcohol or street drugs. If the patient feels medication is causing altered alertness then do not drive or operate dangerous equipment.  Should be noted that the patient appears to be meeting appropriate use of opioids and response.  Evidenced by improved function and decent pain control without significant side effects and no evidence of overt aberrancy issues.  Upon discussion with the patient today they understand that opioid therapy is optional and they feel that the pain has been refractory to reasonable conservative measures and is significant and affecting quality of life enough to warrant ongoing therapy and wishes to continue opioids.  Refills were provided.  [place]  medical Board guidelines regarding the pain medicine has been reviewed.  CDC guidelines most updated 2022 has been reviewed by the prescriber.  PDMP is checked on a regular basis yearly urine drug screen and pain management contract  Treatment plan for this patient includes #1-gentle stretching exercises as shown daily basis 2.  Mild strength exercises 3 times per week #3 continue pain medications #4 notify us  if any digression  Patient does benefit from medicine allows her to be more functional she follows all the guidelines  3. Vitamin B 12  deficiency Check lab work continue OTC supplement - Vitamin B12  4. Vitamin D  deficiency Lab work continue OTC supplement - VITAMIN D  25 Hydroxy (Vit-D Deficiency, Fractures)  Patient can planes of a lot of fatigue and tiredness more than likely related to age related issues and deconditioning  Follow-up 3 months

## 2023-10-10 DIAGNOSIS — I10 Essential (primary) hypertension: Secondary | ICD-10-CM | POA: Diagnosis not present

## 2023-10-10 DIAGNOSIS — E559 Vitamin D deficiency, unspecified: Secondary | ICD-10-CM | POA: Diagnosis not present

## 2023-10-10 DIAGNOSIS — E538 Deficiency of other specified B group vitamins: Secondary | ICD-10-CM | POA: Diagnosis not present

## 2023-10-11 LAB — BASIC METABOLIC PANEL WITH GFR
BUN/Creatinine Ratio: 15 (ref 12–28)
BUN: 10 mg/dL (ref 8–27)
CO2: 22 mmol/L (ref 20–29)
Calcium: 9.4 mg/dL (ref 8.7–10.3)
Chloride: 103 mmol/L (ref 96–106)
Creatinine, Ser: 0.66 mg/dL (ref 0.57–1.00)
Glucose: 82 mg/dL (ref 70–99)
Potassium: 4.3 mmol/L (ref 3.5–5.2)
Sodium: 140 mmol/L (ref 134–144)
eGFR: 88 mL/min/{1.73_m2} (ref >59)

## 2023-10-11 LAB — VITAMIN D 25 HYDROXY (VIT D DEFICIENCY, FRACTURES): Vit D, 25-Hydroxy: 51.2 ng/mL (ref 30.0–100.0)

## 2023-10-11 LAB — VITAMIN B12: Vitamin B-12: 1215 pg/mL (ref 232–1245)

## 2023-10-15 ENCOUNTER — Ambulatory Visit: Payer: Self-pay | Admitting: Family Medicine

## 2023-10-17 ENCOUNTER — Ambulatory Visit: Payer: PPO | Admitting: Family Medicine

## 2023-11-14 ENCOUNTER — Inpatient Hospital Stay (HOSPITAL_COMMUNITY)
Admission: EM | Admit: 2023-11-14 | Discharge: 2023-11-16 | DRG: 389 | Disposition: A | Discharge status: Home / Self Care | Attending: Family Medicine | Admitting: Family Medicine

## 2023-11-14 ENCOUNTER — Other Ambulatory Visit: Payer: Self-pay

## 2023-11-14 ENCOUNTER — Inpatient Hospital Stay (HOSPITAL_COMMUNITY)

## 2023-11-14 ENCOUNTER — Encounter (HOSPITAL_COMMUNITY): Payer: Self-pay

## 2023-11-14 ENCOUNTER — Emergency Department (HOSPITAL_COMMUNITY)

## 2023-11-14 DIAGNOSIS — Z4682 Encounter for fitting and adjustment of non-vascular catheter: Secondary | ICD-10-CM | POA: Diagnosis not present

## 2023-11-14 DIAGNOSIS — E7849 Other hyperlipidemia: Secondary | ICD-10-CM | POA: Diagnosis not present

## 2023-11-14 DIAGNOSIS — Z79891 Long term (current) use of opiate analgesic: Secondary | ICD-10-CM

## 2023-11-14 DIAGNOSIS — I700 Atherosclerosis of aorta: Secondary | ICD-10-CM | POA: Diagnosis not present

## 2023-11-14 DIAGNOSIS — R1011 Right upper quadrant pain: Secondary | ICD-10-CM | POA: Diagnosis not present

## 2023-11-14 DIAGNOSIS — G894 Chronic pain syndrome: Secondary | ICD-10-CM | POA: Diagnosis present

## 2023-11-14 DIAGNOSIS — M810 Age-related osteoporosis without current pathological fracture: Secondary | ICD-10-CM | POA: Diagnosis not present

## 2023-11-14 DIAGNOSIS — R109 Unspecified abdominal pain: Secondary | ICD-10-CM | POA: Diagnosis present

## 2023-11-14 DIAGNOSIS — Z90711 Acquired absence of uterus with remaining cervical stump: Secondary | ICD-10-CM

## 2023-11-14 DIAGNOSIS — K219 Gastro-esophageal reflux disease without esophagitis: Secondary | ICD-10-CM | POA: Diagnosis present

## 2023-11-14 DIAGNOSIS — E876 Hypokalemia: Secondary | ICD-10-CM | POA: Diagnosis not present

## 2023-11-14 DIAGNOSIS — Z9049 Acquired absence of other specified parts of digestive tract: Secondary | ICD-10-CM

## 2023-11-14 DIAGNOSIS — M1990 Unspecified osteoarthritis, unspecified site: Secondary | ICD-10-CM | POA: Diagnosis present

## 2023-11-14 DIAGNOSIS — Z981 Arthrodesis status: Secondary | ICD-10-CM

## 2023-11-14 DIAGNOSIS — J438 Other emphysema: Secondary | ICD-10-CM

## 2023-11-14 DIAGNOSIS — I10 Essential (primary) hypertension: Secondary | ICD-10-CM | POA: Diagnosis present

## 2023-11-14 DIAGNOSIS — I2510 Atherosclerotic heart disease of native coronary artery without angina pectoris: Secondary | ICD-10-CM | POA: Diagnosis not present

## 2023-11-14 DIAGNOSIS — K828 Other specified diseases of gallbladder: Secondary | ICD-10-CM | POA: Diagnosis present

## 2023-11-14 DIAGNOSIS — J449 Chronic obstructive pulmonary disease, unspecified: Secondary | ICD-10-CM | POA: Diagnosis present

## 2023-11-14 DIAGNOSIS — K56609 Unspecified intestinal obstruction, unspecified as to partial versus complete obstruction: Principal | ICD-10-CM | POA: Diagnosis present

## 2023-11-14 DIAGNOSIS — Z87891 Personal history of nicotine dependence: Secondary | ICD-10-CM | POA: Diagnosis not present

## 2023-11-14 DIAGNOSIS — E7800 Pure hypercholesterolemia, unspecified: Secondary | ICD-10-CM | POA: Diagnosis present

## 2023-11-14 DIAGNOSIS — Z833 Family history of diabetes mellitus: Secondary | ICD-10-CM

## 2023-11-14 DIAGNOSIS — J439 Emphysema, unspecified: Secondary | ICD-10-CM | POA: Diagnosis present

## 2023-11-14 DIAGNOSIS — R112 Nausea with vomiting, unspecified: Secondary | ICD-10-CM | POA: Diagnosis not present

## 2023-11-14 DIAGNOSIS — K8689 Other specified diseases of pancreas: Secondary | ICD-10-CM | POA: Diagnosis not present

## 2023-11-14 DIAGNOSIS — Z888 Allergy status to other drugs, medicaments and biological substances status: Secondary | ICD-10-CM | POA: Diagnosis not present

## 2023-11-14 DIAGNOSIS — Z79899 Other long term (current) drug therapy: Secondary | ICD-10-CM

## 2023-11-14 DIAGNOSIS — Z8542 Personal history of malignant neoplasm of other parts of uterus: Secondary | ICD-10-CM | POA: Diagnosis not present

## 2023-11-14 DIAGNOSIS — Z8249 Family history of ischemic heart disease and other diseases of the circulatory system: Secondary | ICD-10-CM

## 2023-11-14 DIAGNOSIS — Z885 Allergy status to narcotic agent status: Secondary | ICD-10-CM

## 2023-11-14 DIAGNOSIS — K460 Unspecified abdominal hernia with obstruction, without gangrene: Secondary | ICD-10-CM | POA: Diagnosis present

## 2023-11-14 DIAGNOSIS — R140 Abdominal distension (gaseous): Secondary | ICD-10-CM | POA: Diagnosis not present

## 2023-11-14 DIAGNOSIS — E785 Hyperlipidemia, unspecified: Secondary | ICD-10-CM | POA: Diagnosis present

## 2023-11-14 LAB — GLUCOSE, CAPILLARY
Glucose-Capillary: 105 mg/dL — ABNORMAL HIGH (ref 70–99)
Glucose-Capillary: 110 mg/dL — ABNORMAL HIGH (ref 70–99)

## 2023-11-14 LAB — MAGNESIUM: Magnesium: 2.5 mg/dL — ABNORMAL HIGH (ref 1.7–2.4)

## 2023-11-14 LAB — CBC WITH DIFFERENTIAL/PLATELET
Abs Immature Granulocytes: 0.02 10*3/uL (ref 0.00–0.07)
Basophils Absolute: 0 10*3/uL (ref 0.0–0.1)
Basophils Relative: 1 %
Eosinophils Absolute: 0 10*3/uL (ref 0.0–0.5)
Eosinophils Relative: 0 %
HCT: 48.1 % — ABNORMAL HIGH (ref 36.0–46.0)
Hemoglobin: 15.6 g/dL — ABNORMAL HIGH (ref 12.0–15.0)
Immature Granulocytes: 0 %
Lymphocytes Relative: 14 %
Lymphs Abs: 1.1 10*3/uL (ref 0.7–4.0)
MCH: 31.6 pg (ref 26.0–34.0)
MCHC: 32.4 g/dL (ref 30.0–36.0)
MCV: 97.6 fL (ref 80.0–100.0)
Monocytes Absolute: 1.2 10*3/uL — ABNORMAL HIGH (ref 0.1–1.0)
Monocytes Relative: 15 %
Neutro Abs: 5.5 10*3/uL (ref 1.7–7.7)
Neutrophils Relative %: 70 %
Platelets: 234 10*3/uL (ref 150–400)
RBC: 4.93 MIL/uL (ref 3.87–5.11)
RDW: 13.5 % (ref 11.5–15.5)
WBC: 7.8 10*3/uL (ref 4.0–10.5)
nRBC: 0 % (ref 0.0–0.2)

## 2023-11-14 LAB — COMPREHENSIVE METABOLIC PANEL WITH GFR
ALT: 20 U/L (ref 0–44)
AST: 36 U/L (ref 15–41)
Albumin: 4.7 g/dL (ref 3.5–5.0)
Alkaline Phosphatase: 52 U/L (ref 38–126)
Anion gap: 17 — ABNORMAL HIGH (ref 5–15)
BUN: 17 mg/dL (ref 8–23)
CO2: 35 mmol/L — ABNORMAL HIGH (ref 22–32)
Calcium: 11.1 mg/dL — ABNORMAL HIGH (ref 8.9–10.3)
Chloride: 90 mmol/L — ABNORMAL LOW (ref 98–111)
Creatinine, Ser: 0.84 mg/dL (ref 0.44–1.00)
GFR, Estimated: >60 mL/min (ref >60)
Glucose, Bld: 125 mg/dL — ABNORMAL HIGH (ref 70–99)
Potassium: 2.9 mmol/L — ABNORMAL LOW (ref 3.5–5.1)
Sodium: 142 mmol/L (ref 135–145)
Total Bilirubin: 0.7 mg/dL (ref 0.0–1.2)
Total Protein: 8.3 g/dL — ABNORMAL HIGH (ref 6.5–8.1)

## 2023-11-14 LAB — LIPASE, BLOOD: Lipase: 43 U/L (ref 11–51)

## 2023-11-14 MED ORDER — SODIUM CHLORIDE 0.9% FLUSH
3.0000 mL | Freq: Two times a day (BID) | INTRAVENOUS | Status: DC
  Administered 2023-11-16 (×2): 3 mL via INTRAVENOUS

## 2023-11-14 MED ORDER — SODIUM CHLORIDE 0.9% FLUSH
3.0000 mL | Freq: Two times a day (BID) | INTRAVENOUS | Status: DC
  Administered 2023-11-14 – 2023-11-16 (×2): 3 mL via INTRAVENOUS

## 2023-11-14 MED ORDER — HYDROMORPHONE HCL 1 MG/ML IJ SOLN
1.0000 mg | INTRAMUSCULAR | Status: DC | PRN
  Administered 2023-11-14 – 2023-11-16 (×11): 1 mg via INTRAVENOUS
  Filled 2023-11-14 (×12): qty 1

## 2023-11-14 MED ORDER — FAMOTIDINE IN NACL 20-0.9 MG/50ML-% IV SOLN
20.0000 mg | Freq: Every day | INTRAVENOUS | Status: DC
  Administered 2023-11-14 – 2023-11-15 (×2): 20 mg via INTRAVENOUS
  Filled 2023-11-14 (×2): qty 50

## 2023-11-14 MED ORDER — ONDANSETRON HCL 4 MG/2ML IJ SOLN: 4.0000 mg | Freq: Once | INTRAMUSCULAR | Status: DC

## 2023-11-14 MED ORDER — POTASSIUM CHLORIDE 10 MEQ/100ML IV SOLN
10.0000 meq | INTRAVENOUS | Status: DC
  Administered 2023-11-14: 10 meq via INTRAVENOUS
  Filled 2023-11-14: qty 100

## 2023-11-14 MED ORDER — IOHEXOL 300 MG/ML  SOLN
100.0000 mL | Freq: Once | INTRAMUSCULAR | Status: AC | PRN
  Administered 2023-11-14: 100 mL via INTRAVENOUS

## 2023-11-14 MED ORDER — SODIUM CHLORIDE 0.9 % IV SOLN: INTRAVENOUS | Status: DC

## 2023-11-14 MED ORDER — ACETAMINOPHEN 650 MG RE SUPP
650.0000 mg | RECTAL | Status: DC | PRN
  Administered 2023-11-14 – 2023-11-15 (×2): 650 mg via RECTAL
  Filled 2023-11-14 (×2): qty 1

## 2023-11-14 MED ORDER — POTASSIUM CHLORIDE 10 MEQ/100ML IV SOLN: 10.0000 meq | INTRAVENOUS | Status: DC

## 2023-11-14 MED ORDER — ONDANSETRON HCL 4 MG PO TABS: 4.0000 mg | ORAL_TABLET | Freq: Four times a day (QID) | ORAL | Status: DC | PRN

## 2023-11-14 MED ORDER — KETOROLAC TROMETHAMINE 15 MG/ML IJ SOLN
15.0000 mg | Freq: Four times a day (QID) | INTRAMUSCULAR | Status: DC | PRN
  Administered 2023-11-14 – 2023-11-16 (×2): 15 mg via INTRAVENOUS
  Filled 2023-11-14 (×2): qty 1

## 2023-11-14 MED ORDER — BISACODYL 10 MG RE SUPP
10.0000 mg | Freq: Every day | RECTAL | Status: DC
  Filled 2023-11-14: qty 1

## 2023-11-14 MED ORDER — HYDROMORPHONE HCL 1 MG/ML IJ SOLN: 1.0000 mg | Freq: Once | INTRAMUSCULAR | Status: DC

## 2023-11-14 MED ORDER — MORPHINE SULFATE (PF) 4 MG/ML IV SOLN
4.0000 mg | Freq: Once | INTRAVENOUS | Status: AC
  Administered 2023-11-14: 4 mg via INTRAVENOUS
  Filled 2023-11-14: qty 1

## 2023-11-14 MED ORDER — HEPARIN SODIUM (PORCINE) 5000 UNIT/ML IJ SOLN
5000.0000 [IU] | Freq: Three times a day (TID) | INTRAMUSCULAR | Status: DC
  Administered 2023-11-14 – 2023-11-16 (×5): 5000 [IU] via SUBCUTANEOUS
  Filled 2023-11-14 (×4): qty 1

## 2023-11-14 MED ORDER — ONDANSETRON HCL 4 MG/2ML IJ SOLN: 4.0000 mg | Freq: Four times a day (QID) | INTRAMUSCULAR | Status: DC | PRN

## 2023-11-14 MED ORDER — ONDANSETRON HCL 4 MG/2ML IJ SOLN
4.0000 mg | Freq: Once | INTRAMUSCULAR | Status: AC
  Administered 2023-11-14: 4 mg via INTRAVENOUS
  Filled 2023-11-14: qty 2

## 2023-11-14 MED ORDER — SODIUM CHLORIDE 0.9% FLUSH: 3.0000 mL | INTRAVENOUS | Status: DC | PRN

## 2023-11-14 MED ORDER — POLYETHYLENE GLYCOL 3350 17 G PO PACK: 17.0000 g | PACK | Freq: Every day | ORAL | Status: DC | PRN

## 2023-11-14 MED ORDER — SODIUM CHLORIDE 0.9 % IV SOLN: INTRAVENOUS | Status: AC | PRN

## 2023-11-14 MED ORDER — HYDRALAZINE HCL 20 MG/ML IJ SOLN: 10.0000 mg | Freq: Four times a day (QID) | INTRAMUSCULAR | Status: DC | PRN

## 2023-11-14 MED ORDER — POTASSIUM CHLORIDE 10 MEQ/100ML IV SOLN
INTRAVENOUS | Status: AC
  Administered 2023-11-14: 10 meq via INTRAVENOUS
  Filled 2023-11-14: qty 100

## 2023-11-14 MED ORDER — TRAZODONE HCL 50 MG PO TABS
50.0000 mg | ORAL_TABLET | Freq: Every evening | ORAL | Status: DC | PRN
  Administered 2023-11-15: 50 mg via ORAL
  Filled 2023-11-14: qty 1

## 2023-11-14 MED ORDER — ONDANSETRON HCL 4 MG/2ML IJ SOLN
4.0000 mg | Freq: Three times a day (TID) | INTRAMUSCULAR | Status: DC | PRN
  Administered 2023-11-14: 4 mg via INTRAVENOUS
  Filled 2023-11-14: qty 2

## 2023-11-14 NOTE — H&P (Signed)
 History and Physical    Patient: Terri Harris WGN:[id number] DOB: [birth year] DOA: 11/14/2023 DOS: the patient was seen and examined on 11/14/2023 PCP: Bennet Brasil, MD  Patient coming from: Home  Chief Complaint:  Chief Complaint  Patient presents with   Abdominal Pain   HPI: Terri Harris is a 83 y.o. female reformed smoker with past medical history relevant for GERD, HLD, history of B12 deficiency on chronic B12 supplementation, HTN, chronic back pain pain and chronic pain syndrome with chronic narcotic use history of recurrent small bowel obstruction--now presents to the ED with abdominal pain, constipation concerns, says abdomen is bloated had emesis with ingested food but no bile no blood -Patient reports recurrent episodes of SBO since having abdominal surgery back in 2015, pleasestatus post prior abdominal hysterectomy status post multiple back surgeries, status post prior partial resection of her colon, status post prior hernia repair. No fever  Or chills  - No urinary symptoms no flank pain - Had mushy BM on 11/13/2023 on 11/14/2023 - No chest pain, no palpitations, no dizziness, no significant dyspnea - In the ED WBC 7.8, hemoglobin 15.6 platelets 234,,  - Potassium is low at 2.9 magnesium  not low, chloride 90, sodium 142, bicarb 35, glucose 125, creatinine 0.8 with a BUN of 17, LFTs are not elevated, lipase WNL - CT abdomen and pelvis with--Findings consistent with high-grade small bowel obstruction, several areas of suspected transition within the small bowel with segments of decompressed small bowel that exhibit mild wall thickening. Distal small bowel in the pelvis is completely decompressed. Findings could be secondary to adhesive disease. Aleft posterolateral hernia containing fat and short segment of dilated fluid-filled bowel is noted, but there appears to be fluid-filled distended bowel both proximal and distal to this hernia. -Also consistent with emphysema and distended  gallbladder without acute cholecystitis noted -. Distended gallbladder. Mild central intra hepatic biliary dilatation and slight dilatation of pancreatic duct. Correlate with LFTs with follow-up MRCP if indicated  Review of Systems: As mentioned in the history of present illness. All other systems reviewed and are negative. Past Medical History:  Diagnosis Date   ANA positive 01/2001   Arthritis    Arthritis 07/02/2013   ANA positive   Cancer (HCC)    partial hysterectomy age 41-uterine cancer   Coronary artery calcification 09/28/2021   GERD (gastroesophageal reflux disease)    Hx of tobacco use, presenting hazards to health 07/02/2013   Hypercholesteremia    Hypertension    Ileus, postoperative (HCC) 07/02/2013   Osteoporosis    Pneumonia    hx of   SBO (small bowel obstruction) (HCC) 06/20/2103   Past Surgical History:  Procedure Laterality Date   ABDOMINAL HYSTERECTOMY     ANTERIOR LAT LUMBAR FUSION N/A 06/08/2014   Procedure: LUMBAR 2-3 LUMBAR 3-4 LUMBAR 4-5 ANTEROLATERAL DECOMPRESSION/FUSION WITH PERCUTANEOUS PEDICLE SCREWS;  Surgeon: Elna Haggis, MD;  Location: [facility];  Service: Neurosurgery;  Laterality: N/A;  L2-3 L3-4 L4-5 ANTEROLATERAL DECOMPRESSION/FUSION WITH PERCUTANEOUS PEDICLE SCREWS   ANTERIOR LAT LUMBAR FUSION Left 01/07/2019   Procedure: Lumbar one-two Anterolateral lumbar interbody fusion with lateral plate fixation;  Surgeon: Elna Haggis, MD;  Location: [facility] OR;  Service: Neurosurgery;  Laterality: Left;   BACK SURGERY     BOWEL RESECTION N/A 06/20/2013   Procedure: SMALL BOWEL RESECTION;  Surgeon: Rogena Class, MD;  Location: [facility] OR;  Service: General;  Laterality: N/A;   COLON SURGERY     COLONOSCOPY  10/25/2011   Procedure:  COLONOSCOPY;  Surgeon: Ruby Corporal, MD;  Location: [facility];  Service: Endoscopy;  Laterality: N/A;  930   COLONOSCOPY N/A 05/09/2017   Procedure: COLONOSCOPY;  Surgeon: Ruby Corporal, MD;  Location: [facility];   Service: Endoscopy;  Laterality: N/A;  1030   ESOPHAGEAL DILATION N/A 05/25/2015   Procedure: ESOPHAGEAL DILATION;  Surgeon: Ruby Corporal, MD;  Location: [facility];  Service: Endoscopy;  Laterality: N/A;   ESOPHAGOGASTRODUODENOSCOPY  02/2009   ESOPHAGOGASTRODUODENOSCOPY N/A 05/25/2015   Procedure: ESOPHAGOGASTRODUODENOSCOPY (EGD);  Surgeon: Ruby Corporal, MD;  Location: [facility];  Service: Endoscopy;  Laterality: N/A;   HERNIA REPAIR Left    groin   INGUINAL HERNIA REPAIR Left 09/11/2013   Procedure: HERNIA REPAIR INGUINAL INCARCERATED;  Surgeon: Rogena Class, MD;  Location: [facility] OR;  Service: General;  Laterality: Left;   LAPAROTOMY N/A 06/20/2013   Procedure: EXPLORATORY LAPAROTOMY;  Surgeon: Rogena Class, MD;  Location: [facility] OR;  Service: General;  Laterality: N/A;   LUMBAR PERCUTANEOUS PEDICLE SCREW 3 LEVEL N/A 06/08/2014   Procedure: LUMBAR PERCUTANEOUS PEDICLE SCREW 3 LEVEL;  Surgeon: Elna Haggis, MD;  Location: [facility];  Service: Neurosurgery;  Laterality: N/A;   POLYPECTOMY  05/09/2017   Procedure: POLYPECTOMY;  Surgeon: Ruby Corporal, MD;  Location: [facility];  Service: Endoscopy;;  sigmoid colon   TONSILLECTOMY     Social History:  reports that she quit smoking about 12 years ago. Her smoking use included cigarettes. She started smoking about 12 years ago. She has never used smokeless tobacco. She reports that she does not drink alcohol and does not use drugs.  Allergies  Allergen Reactions   Codeine Nausea And Vomiting   Fosamax  [Alendronate  Sodium] Other (See Comments)    Gastritis     Family History  Problem Relation Age of Onset   Hypertension Mother    Heart disease Father    Diabetes Sister    Heart disease Sister    Diabetes Brother    Heart disease Brother    Anesthesia problems Neg Hx    Hypotension Neg Hx    Malignant hyperthermia Neg Hx    Pseudochol deficiency Neg Hx     Prior to Admission medications   Not on File     Physical Exam: Vitals:   11/14/23 1116 11/14/23 1116 11/14/23 1400 11/14/23 1605  BP:  (!) 157/114 (!) 158/74 (!) 166/91  Pulse:  92  81  Resp:  20  (!) 22  Temp:  98.6 F (37 C)  98.6 F (37 C)  TempSrc:    Oral  SpO2:  90%  98%  Weight: 49.9 kg     Height: 5' 5 (1.651 m)       Physical Exam  Gen:- Awake Alert, in no acute distress  HEENT:- [place].AT, No sclera icterus Nose- NG tube with gastric contents Neck-Supple Neck,No JVD,.  Lungs-  CTAB , fair air movement bilaterally  CV- S1, S2 normal, RRR Abd-abdominal distention, healed abdominal scars, diminished bowel sounds, generalized abdominal tenderness Extremity/Skin:- No  edema,   good pedal pulses  Psych-affect is appropriate, oriented x3 Neuro-no new focal deficits, no tremors  Data Reviewed:  WBC 7.8, hemoglobin 15.6 platelets 234,,  - Potassium is low at 2.9 magnesium  not low, chloride 90, sodium 142, bicarb 35, glucose 125, creatinine 0.8 with a BUN of 17, LFTs are not elevated, lipase WNL - CT abdomen and pelvis with--Findings consistent with high-grade small bowel obstruction, several  areas of suspected transition within the small bowel with segments of decompressed small bowel that exhibit mild wall thickening. Distal small bowel in the pelvis is completely decompressed. Findings could be secondary to adhesive disease. Aleft posterolateral hernia containing fat and short segment of dilated fluid-filled bowel is noted, but there appears to be fluid-filled distended bowel both proximal and distal to this hernia. -Also consistent with emphysema and distended gallbladder without acute cholecystitis noted . Distended gallbladder. Mild central intra hepatic biliary dilatation and slight dilatation of pancreatic duct. Correlate with LFTs with follow-up MRCP if indicated Assessment and Plan: 1)Recurrent SBO --- CT abdomen and pelvis findings as above - General Surgery consult appreciated - NG tube in situ - N.p.o.  except for ice chips -- Conservative management recommended by general surgeon at this time, may need surgery if she fails - As needed antiemetics, IV fluids while n.p.o., as needed pain control opiates  2)AbNormal gallbladder imaging--. Distended gallbladder. Mild central intra hepatic biliary dilatation and slight dilatation of pancreatic duct. Correlate with LFTs with follow-up MRCP if indicated --- Largely asymptomatic from a gallbladder standpoint --Defer decision on further imaging to general surgery  3)COPD--no acute exacerbation at this time - As needed bronchodilators as ordered  4)HTN--IV hydralazine  as needed elevated BP while NPO  5)Hypokalemia--magnesium  WNL - Suspect hypokalemia due to emesis/GI losses- --replace potassium IV while n.p.o.  6) chronic pain syndrome/chronic back pain--- hold PTA oral opiates -- IV opiates as above #1  7)GERD--switch to IV Pepcid while n.p.o.    Advance Care Planning:   Code Status: Full Code   Consults: Gen surgery  Family Communication: None available at this time  Severity of Illness: The appropriate patient status for this patient is INPATIENT. Inpatient status is judged to be reasonable and necessary in order to provide the required intensity of service to ensure the patient's safety. The patient's presenting symptoms, physical exam findings, and initial radiographic and laboratory data in the context of their chronic comorbidities is felt to place them at high risk for further clinical deterioration. Furthermore, it is not anticipated that the patient will be medically stable for discharge from the hospital within 2 midnights of admission.   * I certify that at the point of admission it is my clinical judgment that the patient will require inpatient hospital care spanning beyond 2 midnights from the point of admission due to high intensity of service, high risk for further deterioration and high frequency of surveillance  required.*  Author: Colin Dawley, MD 11/14/2023 7:00 PM  For on call review [email or web address].

## 2023-11-14 NOTE — Plan of Care (Signed)

## 2023-11-14 NOTE — ED Triage Notes (Signed)
 Pt arrived via POV c/o abdominal pain, pressure and constipation. Pt reports last BM was yesterday. Pt endorses feeling distended and bloated. Pt reports a few episodes of emesis last night.

## 2023-11-14 NOTE — Progress Notes (Signed)
 Patient stated she put an ice chip in her mouth to dissolve and it slid to the L side of her throat. Patient c/o having difficulty to swallow. Patient Ng tube started draining pink secretions. Order for chest xray placed. MD Segars made aware.

## 2023-11-14 NOTE — Progress Notes (Signed)
 Abdominal xray came back recommending to advance the NGT 7cm. NGT is now marked at the nostril at 55cm.

## 2023-11-14 NOTE — ED Notes (Signed)
 Patient transported to CT

## 2023-11-14 NOTE — Consult Note (Signed)
 Reason for Consult: Small bowel obstruction Referring Physician: Dr. Adan Holms, ER  Terri Harris is an 83 y.o. female.  HPI: Patient is an 83 year old white female with multiple medical problems who presents to the hospital with a 24-hour history of worsening abdominal distention, nausea, and vomiting.  CT scan of the abdomen pelvis formal report is still pending with the patient does have multiple loops of dilated small bowel consistent with a small bowel obstruction.  Patient states that she has had this several times since her surgery in 2015.  She does not remember the last time she had an episode.  She did pass gas while in the emergency room.  She did have a bowel movement yesterday.  She states she has to take medication to help with her bowel movements.  She states her abdomen is definitely more distended than usual.  She denies any fever, chills.  Past Medical History:  Diagnosis Date   ANA positive 01/2001   Arthritis    Arthritis 07/02/2013   ANA positive   Cancer (HCC)    partial hysterectomy age 44-uterine cancer   Coronary artery calcification 09/28/2021   GERD (gastroesophageal reflux disease)    Hx of tobacco use, presenting hazards to health 07/02/2013   Hypercholesteremia    Hypertension    Ileus, postoperative (HCC) 07/02/2013   Osteoporosis    Pneumonia    hx of   SBO (small bowel obstruction) (HCC) 06/20/2103    Past Surgical History:  Procedure Laterality Date   ABDOMINAL HYSTERECTOMY     ANTERIOR LAT LUMBAR FUSION N/A 06/08/2014   Procedure: LUMBAR 2-3 LUMBAR 3-4 LUMBAR 4-5 ANTEROLATERAL DECOMPRESSION/FUSION WITH PERCUTANEOUS PEDICLE SCREWS;  Surgeon: Elna Haggis, MD;  Location: [facility];  Service: Neurosurgery;  Laterality: N/A;  L2-3 L3-4 L4-5 ANTEROLATERAL DECOMPRESSION/FUSION WITH PERCUTANEOUS PEDICLE SCREWS   ANTERIOR LAT LUMBAR FUSION Left 01/07/2019   Procedure: Lumbar one-two Anterolateral lumbar interbody fusion with lateral plate fixation;  Surgeon:  Elna Haggis, MD;  Location: [facility] OR;  Service: Neurosurgery;  Laterality: Left;   BACK SURGERY     BOWEL RESECTION N/A 06/20/2013   Procedure: SMALL BOWEL RESECTION;  Surgeon: Rogena Class, MD;  Location: [facility] OR;  Service: General;  Laterality: N/A;   COLON SURGERY     COLONOSCOPY  10/25/2011   Procedure: COLONOSCOPY;  Surgeon: Ruby Corporal, MD;  Location: [facility];  Service: Endoscopy;  Laterality: N/A;  930   COLONOSCOPY N/A 05/09/2017   Procedure: COLONOSCOPY;  Surgeon: Ruby Corporal, MD;  Location: [facility];  Service: Endoscopy;  Laterality: N/A;  1030   ESOPHAGEAL DILATION N/A 05/25/2015   Procedure: ESOPHAGEAL DILATION;  Surgeon: Ruby Corporal, MD;  Location: [facility];  Service: Endoscopy;  Laterality: N/A;   ESOPHAGOGASTRODUODENOSCOPY  02/2009   ESOPHAGOGASTRODUODENOSCOPY N/A 05/25/2015   Procedure: ESOPHAGOGASTRODUODENOSCOPY (EGD);  Surgeon: Ruby Corporal, MD;  [facility]: [facility];  Service: Endoscopy;  Laterality: N/A;   HERNIA REPAIR Left    groin   INGUINAL HERNIA REPAIR Left 09/11/2013   Procedure: HERNIA REPAIR INGUINAL INCARCERATED;  Surgeon: Rogena Class, MD;  Location: [facility];  Service: General;  Laterality: Left;   LAPAROTOMY N/A 06/20/2013   Procedure: EXPLORATORY LAPAROTOMY;  Surgeon: Rogena Class, MD;  Location: [facility];  Service: General;  Laterality: N/A;   LUMBAR PERCUTANEOUS PEDICLE SCREW 3 LEVEL N/A 06/08/2014   Procedure: LUMBAR PERCUTANEOUS PEDICLE SCREW 3 LEVEL;  Surgeon: Elna Haggis, MD;  Location: [facility];  Service: Neurosurgery;  Laterality: N/A;   POLYPECTOMY  05/09/2017   Procedure: POLYPECTOMY;  Surgeon: Ruby Corporal, MD;  [facility]: [facility];  Service: Endoscopy;;  sigmoid colon   TONSILLECTOMY      Family History  Problem Relation Age of Onset   Hypertension Mother    Heart disease Father    Diabetes Sister    Heart disease Sister    Diabetes Brother    Heart disease Brother    Anesthesia  problems Neg Hx    Hypotension Neg Hx    Malignant hyperthermia Neg Hx    Pseudochol deficiency Neg Hx     Social History:  reports that she quit smoking about 12 years ago. Her smoking use included cigarettes. She started smoking about 12 years ago. She has never used smokeless tobacco. She reports that she does not drink alcohol and does not use drugs.  Allergies:  Allergies  Allergen Reactions   Codeine Nausea And Vomiting   Fosamax  [Alendronate  Sodium] Other (See Comments)    gastritis    Medications: I have reviewed the patient's current medications. Prior to Admission: (Not in a hospital admission)   Results for orders placed or performed during the hospital encounter of 11/14/23 (from the past 48 hours)  CBC with Differential     Status: Abnormal   Collection Time: 11/14/23 12:05 PM  Result Value Ref Range   WBC 7.8 4.0 - 10.5 K/uL   RBC 4.93 3.87 - 5.11 MIL/uL   Hemoglobin 15.6 (H) 12.0 - 15.0 g/dL   HCT 16.1 (H) 09.6 - 04.5 %   MCV 97.6 80.0 - 100.0 fL   MCH 31.6 26.0 - 34.0 pg   MCHC 32.4 30.0 - 36.0 g/dL   RDW 40.9 81.1 - 91.4 %   Platelets 234 150 - 400 K/uL   nRBC 0.0 0.0 - 0.2 %   Neutrophils Relative % 70 %   Neutro Abs 5.5 1.7 - 7.7 K/uL   Lymphocytes Relative 14 %   Lymphs Abs 1.1 0.7 - 4.0 K/uL   Monocytes Relative 15 %   Monocytes Absolute 1.2 (H) 0.1 - 1.0 K/uL   Eosinophils Relative 0 %   Eosinophils Absolute 0.0 0.0 - 0.5 K/uL   Basophils Relative 1 %   Basophils Absolute 0.0 0.0 - 0.1 K/uL   Immature Granulocytes 0 %   Abs Immature Granulocytes 0.02 0.00 - 0.07 K/uL    Comment: Performed at [facility], [address], [place], [place] [address]  Comprehensive metabolic panel     Status: Abnormal   Collection Time: 11/14/23 12:05 PM  Result Value Ref Range   Sodium 142 135 - 145 mmol/L   Potassium 2.9 (L) 3.5 - 5.1 mmol/L   Chloride 90 (L) 98 - 111 mmol/L   CO2 35 (H) 22 - 32 mmol/L   Glucose, Bld 125 (H) 70 - 99 mg/dL    Comment: Glucose  reference range applies only to samples taken after fasting for at least 8 hours.   BUN 17 8 - 23 mg/dL   Creatinine, Ser 6.21 0.44 - 1.00 mg/dL   Calcium  11.1 (H) 8.9 - 10.3 mg/dL   Total Protein 8.3 (H) 6.5 - 8.1 g/dL   Albumin  4.7 3.5 - 5.0 g/dL   AST 36 15 - 41 U/L   ALT 20 0 - 44 U/L   Alkaline Phosphatase 52 38 - 126 U/L   Total Bilirubin 0.7 0.0 - 1.2 mg/dL   GFR, Estimated >30 >86  mL/min    Comment: (NOTE) Calculated using the CKD-EPI Creatinine Equation (2021)    Anion gap 17 (H) 5 - 15    Comment: Performed at [facility], [address] [place], [place] [address]  Lipase, blood     Status: None   Collection Time: 11/14/23 12:05 PM  Result Value Ref Range   Lipase 43 11 - 51 U/L    Comment: Performed at [facility], [address], [place], [place] [address]  Magnesium      Status: Abnormal   Collection Time: 11/14/23 12:05 PM  Result Value Ref Range   Magnesium  2.5 (H) 1.7 - 2.4 mg/dL    Comment: Performed at [facility], [address] [place], [place] [address]    No results found.  ROS:  Pertinent items are noted in HPI.  Blood pressure (!) 157/114, pulse 92, temperature 98.6 F (37 C), resp. rate 20, height 5' 5 (1.651 m), weight 49.9 kg, SpO2 90%. Physical Exam: Pleasant white female in no acute distress Head is normocephalic, atraumatic Lungs clear to auscultation with your breath sounds bilaterally Heart examination reveals a regular rate and rhythm without S3, S4, murmurs Abdomen is distended but soft.  Occasional tinkling bowel sounds appreciated.  No rigidity is noted.  CT scan images personally reviewed  Assessment/Plan: Impression: Small bowel obstruction most likely secondary to adhesive disease.  Patient is also severely hypokalemic.  No need for acute surgical intervention at the present time. Plan: Will have NG tube placed to low intermittent suction.  Would replete potassium.  We will reassess tomorrow in AM. Alanda Allegra 11/14/2023, 2:38 PM

## 2023-11-14 NOTE — ED Provider Notes (Cosign Needed)
 [place] EMERGENCY DEPARTMENT AT [facility] Provider Note   CSN: [id number] Arrival date & time: 11/14/23  1107     Patient presents with: Abdominal Pain  Terri Harris is a 83 y.o. female presenting for evaluation of abdominal pain, distention, nausea and vomiting which started yesterday evening.  She has a history of small bowel obstruction which she suspects is the source of today's symptoms.  She does also have a history of constipation probably due to chronic narcotic use for chronic back pain, but she states she takes daily MiraLAX  to balance this problem.  She underwent surgery for a partial small bowel resection in 2015 secondary to obstruction, she states since that surgery that section of intestine is smaller than normal causing intermittent problems.  She denies fevers or chills.  Denies dysuria, her last bowel movement was yesterday and normal for her, although that event occurred prior to onset of her pain and distention symptoms.  Has had no treatment prior to arrival.   The history is provided by the patient.       Prior to Admission medications   Medication Sig Start Date End Date Taking? Authorizing Provider  acetaminophen  (TYLENOL ) 325 MG tablet Take 2 tablets (650 mg total) by mouth every 6 (six) hours as needed for mild pain (or Fever >/= 101). 04/03/21   Colin Dawley, MD  albuterol  (VENTOLIN  HFA) 108 (90 Base) MCG/ACT inhaler INHALE 2 PUFFS BY MOUTH EVERY 4 HOURS AS NEEDED FOR WHEEZE OR FOR SHORTNESS OF BREATH 09/12/22   Bennet Brasil, MD  alum & mag hydroxide-simeth (MAALOX/MYLANTA) 200-200-20 MG/5ML suspension Take 15 mLs by mouth every 6 (six) hours as needed for indigestion or heartburn.    [provider]  amLODipine  (NORVASC ) 5 MG tablet Take 1 tablet (5 mg total) by mouth daily. 04/09/23   Bennet Brasil, MD  cyanocobalamin  1000 MCG tablet Take 1,000 mcg by mouth daily.    [provider]  oxyCODONE -acetaminophen   (PERCOCET/ROXICET) 5-325 MG tablet 1 taken up to 4 times daily as needed for pain 10/01/23   Bennet Brasil, MD  oxyCODONE -acetaminophen  (PERCOCET/ROXICET) 5-325 MG tablet 1 taken 4 times daily as needed for pain 10/01/23   Bennet Brasil, MD  oxyCODONE -acetaminophen  (PERCOCET/ROXICET) 5-325 MG tablet 1 taken 4 times daily as needed for pain 10/01/23   Bennet Brasil, MD  polyethylene glycol (MIRALAX  / GLYCOLAX ) 17 g packet Take 17 g by mouth daily. 04/03/21   Colin Dawley, MD  rosuvastatin  (CRESTOR ) 10 MG tablet Take 1 tablet (10 mg total) by mouth daily. 04/09/23   Bennet Brasil, MD    Allergies: Codeine and Fosamax  [alendronate  sodium]    Review of Systems  Constitutional:  Negative for chills and fever.  HENT: Negative.    Eyes: Negative.   Respiratory:  Negative for chest tightness and shortness of breath.   Cardiovascular:  Negative for chest pain.  Gastrointestinal:  Positive for abdominal distention, abdominal pain, diarrhea, nausea and vomiting.  Genitourinary: Negative.   Musculoskeletal:  Positive for arthralgias and back pain. Negative for joint swelling and neck pain.       Chronic back pain  Skin: Negative.  Negative for rash and wound.  Neurological:  Negative for dizziness, weakness, light-headedness, numbness and headaches.  Psychiatric/Behavioral: Negative.      Updated Vital Signs BP (!) 157/114 (BP Location: Right Arm)   Pulse 92   Temp 98.6 F (37 C)   Resp 20   Ht 5' 5 (  1.651 m)   Wt 49.9 kg   LMP  (LMP Unknown)   SpO2 90%   BMI 18.30 kg/m   Physical Exam Vitals and nursing note reviewed.  Constitutional:      Appearance: She is well-developed.  HENT:     Head: Normocephalic and atraumatic.   Eyes:     Conjunctiva/sclera: Conjunctivae normal.    Cardiovascular:     Rate and Rhythm: Normal rate and regular rhythm.     Heart sounds: Normal heart sounds.  Pulmonary:     Effort: Pulmonary effort is normal.     Breath sounds: Normal breath  sounds. No wheezing.  Abdominal:     General: Bowel sounds are normal. There is distension.     Palpations: Abdomen is soft. There is no pulsatile mass.     Tenderness: There is generalized abdominal tenderness. There is guarding.     Comments: Increased tympany throughout, hyperactive bs.   Musculoskeletal:        General: Normal range of motion.     Cervical back: Normal range of motion.   Skin:    General: Skin is warm and dry.   Neurological:     Mental Status: She is alert.     (all labs ordered are listed, but only abnormal results are displayed) Labs Reviewed  CBC WITH DIFFERENTIAL/PLATELET - Abnormal; Notable for the following components:      Result Value   Hemoglobin 15.6 (*)    HCT 48.1 (*)    Monocytes Absolute 1.2 (*)    All other components within normal limits  COMPREHENSIVE METABOLIC PANEL WITH GFR - Abnormal; Notable for the following components:   Potassium 2.9 (*)    Chloride 90 (*)    CO2 35 (*)    Glucose, Bld 125 (*)    Calcium  11.1 (*)    Total Protein 8.3 (*)    Anion gap 17 (*)    All other components within normal limits  MAGNESIUM  - Abnormal; Notable for the following components:   Magnesium  2.5 (*)    All other components within normal limits  LIPASE, BLOOD  URINALYSIS, ROUTINE W REFLEX MICROSCOPIC    EKG: None  Radiology: No results found.   Procedures   Medications Ordered in the ED  potassium chloride  10 mEq in 100 mL IVPB (10 mEq Intravenous New Bag/Given 11/14/23 1349)  ondansetron  (ZOFRAN ) injection 4 mg (has no administration in time range)  HYDROmorphone  (DILAUDID ) injection 1 mg (has no administration in time range)  potassium chloride  10 mEq in 100 mL IVPB (has no administration in time range)  morphine  (PF) 4 MG/ML injection 4 mg (4 mg Intravenous Given 11/14/23 1206)  ondansetron  (ZOFRAN ) injection 4 mg (4 mg Intravenous Given 11/14/23 1206)  iohexol  (OMNIPAQUE ) 300 MG/ML solution 100 mL (100 mLs Intravenous Contrast  Given 11/14/23 1320)  morphine  (PF) 4 MG/ML injection 4 mg (4 mg Intravenous Given 11/14/23 1411)    Clinical Course as of 11/14/23 1444  Wed Nov 14, 2023  1303 Labs resulting.  K+ 2.9 - x 2 ordered. [JI]    Clinical Course User Index [JI] Alyse July                                 Medical Decision Making Pt presenting with abdominal pain, distention, n/v ddx including constipation, sbo, colitis, ileus, AAA although CT imaging in 2024 negative for aneurysm,  diverticulitis with abscess/perforation.  Surgical hx including appy and cholecystecomy, hysterectomy.  Labs and imaging below.  Pt continues to have pain not relieved by morphine ,  suspect increased tolerance given chronic oxycodone  use, added dilaudid .    Amount and/or Complexity of Data Reviewed Labs: ordered.    Details: Pertinent labs including K+ 2.9, co2  elevated at 35 (smoker),  chloride 90, anion gap 17, wbc normal at7.8 Radiology: ordered and independent interpretation performed.    Details: Sbo,  pending radiology interpretation Discussion of management or test interpretation with external provider(s): Spoke with Dr. Larrie Po who reviewed CT and agrees with sbo, no free air noted.  Will consult pt,  NG tube with low intermittent suction.  Call to hospitalist. Discussed pt with Dr. Quintella Buck who accepts pt for admission.  Risk Decision regarding hospitalization.        Final diagnoses:  SBO (small bowel obstruction) [facility])  Hypokalemia    ED Discharge Orders     None          Katherine Pancake, Kirby Peoples 11/14/23 1444

## 2023-11-15 ENCOUNTER — Inpatient Hospital Stay (HOSPITAL_COMMUNITY)

## 2023-11-15 DIAGNOSIS — J438 Other emphysema: Secondary | ICD-10-CM | POA: Diagnosis not present

## 2023-11-15 DIAGNOSIS — K219 Gastro-esophageal reflux disease without esophagitis: Secondary | ICD-10-CM

## 2023-11-15 DIAGNOSIS — Z4682 Encounter for fitting and adjustment of non-vascular catheter: Secondary | ICD-10-CM | POA: Diagnosis not present

## 2023-11-15 DIAGNOSIS — I10 Essential (primary) hypertension: Secondary | ICD-10-CM | POA: Diagnosis not present

## 2023-11-15 DIAGNOSIS — K56609 Unspecified intestinal obstruction, unspecified as to partial versus complete obstruction: Secondary | ICD-10-CM | POA: Diagnosis not present

## 2023-11-15 LAB — BASIC METABOLIC PANEL WITH GFR
Anion gap: 9 (ref 5–15)
BUN: 18 mg/dL (ref 8–23)
CO2: 32 mmol/L (ref 22–32)
Calcium: 10.3 mg/dL (ref 8.9–10.3)
Chloride: 100 mmol/L (ref 98–111)
Creatinine, Ser: 0.75 mg/dL (ref 0.44–1.00)
GFR, Estimated: >60 mL/min (ref >60)
Glucose, Bld: 104 mg/dL — ABNORMAL HIGH (ref 70–99)
Potassium: 3 mmol/L — ABNORMAL LOW (ref 3.5–5.1)
Sodium: 141 mmol/L (ref 135–145)

## 2023-11-15 LAB — GLUCOSE, CAPILLARY
Glucose-Capillary: 109 mg/dL — ABNORMAL HIGH (ref 70–99)
Glucose-Capillary: 70 mg/dL (ref 70–99)
Glucose-Capillary: 84 mg/dL (ref 70–99)

## 2023-11-15 MED ORDER — DIATRIZOATE MEGLUMINE & SODIUM 66-10 % PO SOLN
90.0000 mL | Freq: Once | ORAL | Status: AC
  Administered 2023-11-15: 90 mL via NASOGASTRIC
  Filled 2023-11-15: qty 90

## 2023-11-15 MED ORDER — POTASSIUM CHLORIDE 10 MEQ/100ML IV SOLN
INTRAVENOUS | Status: AC
  Filled 2023-11-15: qty 100

## 2023-11-15 MED ORDER — KCL IN DEXTROSE-NACL 20-5-0.45 MEQ/L-%-% IV SOLN: INTRAVENOUS | Status: DC

## 2023-11-15 MED ORDER — DEXTROSE 50 % IV SOLN
1.0000 | Freq: Once | INTRAVENOUS | Status: AC
  Administered 2023-11-15: 50 mL via INTRAVENOUS
  Filled 2023-11-15: qty 50

## 2023-11-15 MED ORDER — POTASSIUM CHLORIDE 10 MEQ/100ML IV SOLN
10.0000 meq | INTRAVENOUS | Status: AC
  Administered 2023-11-15 (×2): 10 meq via INTRAVENOUS
  Filled 2023-11-15 (×2): qty 100

## 2023-11-15 MED ORDER — POTASSIUM CHLORIDE 10 MEQ/100ML IV SOLN
10.0000 meq | INTRAVENOUS | Status: DC
  Administered 2023-11-15 (×2): 10 meq via INTRAVENOUS
  Filled 2023-11-15: qty 100

## 2023-11-15 NOTE — Progress Notes (Signed)
 Mobility Specialist Progress Note:    11/15/23 1335  Mobility  Activity Transferred to/from [facility]  Level of Assistance Modified independent, requires aide device or extra time  Assistive Device None  Distance Ambulated (ft) 2 ft  Range of Motion/Exercises Active;All extremities  Activity Response Tolerated well  Mobility Referral Yes  Mobility visit 1 Mobility  Mobility Specialist Start Time (ACUTE ONLY) 1330  Mobility Specialist Stop Time (ACUTE ONLY) 1335  Mobility Specialist Time Calculation (min) (ACUTE ONLY) 5 min   Pt received in bed, agreeable to mobility. Required SBA to stand and transfer with no AD. Tolerated well, deferred further mobiltiy d/t 10/10 pain. Left pt with RN, all needs met.   Glinda Lapping Mobility Specialist Please contact via Special educational needs teacher or  Rehab office at [phone]

## 2023-11-15 NOTE — Progress Notes (Addendum)
 Patient Ng tube hooked to low intermittent suction, having brown/coffee ground output. Patient abdomen is distended, tender to touch. Patient c/o feeling bloating. Pain 10/10. PRN Dilaudid  given.

## 2023-11-15 NOTE — Progress Notes (Signed)
 Ac Peterson Brandt advanced Ng tube, placement checked. 55 cm marked on tubing. Tape intact. Clip intact.

## 2023-11-15 NOTE — TOC CM/SW Note (Signed)
 Transition of Care [facility]) - Inpatient Brief Assessment   Patient Details  Name: Terri Harris MRN: [id number] Date of Birth: Apr 07, 1941  Transition of Care [facility]) CM/SW Contact:    Grandville Lax, LCSWA Phone Number: 11/15/2023, 9:12 AM   Clinical Narrative: Transition of Care Department [facility]) has reviewed patient and no TOC needs have been identified at this time. We will continue to monitor patient advancement through interdiciplinary progression rounds. If new patient transition needs arise, please place a TOC consult.   Transition of Care Asessment: Insurance and Status: Insurance coverage has been reviewed Patient has primary care physician: Yes Home environment has been reviewed: From home Prior level of function:: Independent Prior/Current Home Services: No current home services Social Drivers of Health Review: SDOH reviewed no interventions necessary Readmission risk has been reviewed: Yes Transition of care needs: no transition of care needs at this time

## 2023-11-15 NOTE — Progress Notes (Signed)
 PROGRESS NOTE  Terri Harris, is a 83 y.o. female, DOB - 1941/05/20, ZOX:[id number]  Admit date - 11/14/2023   Admitting Physician Spyros Winch Quintella Buck, MD  Outpatient Primary MD for the patient is Bennet Brasil, MD  LOS - 1  Chief Complaint  Patient presents with   Abdominal Pain     Brief Narrative:  83 y.o. female reformed smoker with past medical history relevant for GERD, HLD, history of B12 deficiency on chronic B12 supplementation, HTN, chronic back pain pain and chronic pain syndrome with chronic narcotic use history of recurrent small bowel obstruction and chronic constipation admitted on 11/14/2023 with SBO    -Assessment and Plan: 1)1)Recurrent SBO --- CT abdomen and pelvis findings of SBO noted - General Surgery consult appreciated - NG tube in situ - N.p.o. except for ice chips -- Conservative management recommended by general surgeon at this time, may need surgery if she fails - As needed antiemetics, IV fluids while n.p.o., as needed pain control opiates 11/15/23 --Patient having liquid BMs -General surgeon has requested Gastrografin  small bowel follow-through imaging studies-   2)AbNormal gallbladder imaging--. Distended gallbladder. Mild central intra hepatic biliary dilatation and slight dilatation of pancreatic duct. Correlate with LFTs with follow-up MRCP if indicated --- Largely asymptomatic from a gallbladder standpoint - Discussed with general surgeon-- will proceed with MRCP   3)COPD--no acute exacerbation at this time - As needed bronchodilators as ordered   4)HTN--IV hydralazine  as needed elevated BP while NPO   5)Hypokalemia--magnesium  WNL - Suspect hypokalemia due to emesis/GI losses- -additional potassium replacements   6) chronic pain syndrome/chronic back pain--- hold PTA oral opiates -- IV opiates as above #1   7)GERD--c/n IV Pepcid while n.p.o.   Status is: Inpatient  Disposition: The patient is from: Home              Anticipated d/c is to:  Home              Anticipated d/c date is: 2 days              Patient currently is not medically stable to d/c. Barriers: Not Clinically Stable-   Code Status :  -  Code Status: Full Code   Family Communication:    NA (patient is alert, awake and coherent)   DVT Prophylaxis  :   - SCDs  heparin  injection 5,000 Units Start: 11/14/23 2200 SCDs Start: 11/14/23 1840 Place TED hose Start: 11/14/23 1840 SCDs Start: 11/14/23 1451 Place TED hose Start: 11/14/23 1451   Lab Results  Component Value Date   PLT 234 11/14/2023    Inpatient Medications  Scheduled Meds:  bisacodyl   10 mg Rectal QHS   heparin   5,000 Units Subcutaneous Q8H   sodium chloride  flush  3 mL Intravenous Q12H   sodium chloride  flush  3 mL Intravenous Q12H   Continuous Infusions:  sodium chloride  83 mL/hr at 11/15/23 0735   sodium chloride      famotidine (PEPCID) IV Stopped (11/14/23 2240)   PRN Meds:.sodium chloride , acetaminophen , hydrALAZINE , HYDROmorphone  (DILAUDID ) injection, ketorolac , ondansetron  **OR** ondansetron  (ZOFRAN ) IV, polyethylene glycol, sodium chloride  flush, traZODone   Anti-infectives (From admission, onward)    None         Subjective: Terri Harris today has no fevers,,  No chest pain,   --Having Liquid BMs - Tolerating NG tube well - No further emesis  Objective: Vitals:   11/15/23 0038 11/15/23 0353 11/15/23 0522 11/15/23 1211  BP: (!) 162/68 (!) 143/67 (!) 142/71 (!) 166/71  Pulse: 73 71 73 69  Resp:  18  20  Temp:  99.4 F (37.4 C)  98.9 F (37.2 C)  TempSrc:  Oral  Oral  SpO2:  94%  98%  Weight:      Height:        Intake/Output Summary (Last 24 hours) at 11/15/2023 1352 Last data filed at 11/15/2023 1100 Gross per 24 hour  Intake 218.3 ml  Output 1300 ml  Net -1081.7 ml   Filed Weights   11/14/23 1116  Weight: 49.9 kg    Physical Exam en:- Awake Alert, in no acute distress  HEENT:- [place].AT, No sclera icterus Nose- NG tube with gastric  contents Neck-Supple Neck,No JVD,.  Lungs-  CTAB , fair air movement bilaterally  CV- S1, S2 normal, RRR Abd-abdominal distention, healed abdominal scars, diminished bowel sounds, generalized abdominal tenderness Extremity/Skin:- No  edema,   good pedal pulses  Psych-affect is appropriate, oriented x3 Neuro-no new focal deficits, no tremors  Data Reviewed: I have personally reviewed following labs and imaging studies  CBC: Recent Labs  Lab 11/14/23 1205  WBC 7.8  NEUTROABS 5.5  HGB 15.6*  HCT 48.1*  MCV 97.6  PLT 234   Basic Metabolic Panel: Recent Labs  Lab 11/14/23 1205 11/15/23 0403  NA 142 141  K 2.9* 3.0*  CL 90* 100  CO2 35* 32  GLUCOSE 125* 104*  BUN 17 18  CREATININE 0.84 0.75  CALCIUM  11.1* 10.3  MG 2.5*  --    GFR: Estimated Creatinine Clearance: 42.7 mL/min (by C-G formula based on SCr of 0.75 mg/dL). Liver Function Tests: Recent Labs  Lab 11/14/23 1205  AST 36  ALT 20  ALKPHOS 52  BILITOT 0.7  PROT 8.3*  ALBUMIN  4.7   Radiology Studies: DG Abd 1 View Result Date: 11/15/2023 EXAM: 1 VIEW XRAY OF THE ABDOMEN SUPINE 11/15/2023 12:29:00 AM COMPARISON: 11/14/2023 CLINICAL HISTORY: Encounter for imaging study to confirm nasogastric (NG) tube placement. FINDINGS: BOWEL: Nonobstructive bowel gas pattern. PERITONEUM AND SOFT TISSUES: No abnormal calcifications. BONES: No acute osseous abnormality. LINES AND TUBES: Enteric tube terminates in the proximal stomach with its side port at the gastroesophageal junction. This is unchanged from prior. IMPRESSION: 1. Enteric tube terminates in the proximal stomach with its side port at the GE junction, unchanged. If used for decompression, this is likely satisfactory position. Electronically signed by: Zadie Herter MD 11/15/2023 12:34 AM EDT RP Workstation: [id number]   DG Abd 2 Views Result Date: 11/14/2023 EXAM: 2 VIEW XRAY OF THE ABDOMEN SUPINE 11/14/2023 05:59:00 PM COMPARISON: Earlier today. CLINICAL HISTORY:  [id number] Encounter for imaging study to confirm nasogastric (NG) tube placement [id number]. NG tube placement. FINDINGS: BOWEL: Dilated central small bowel, suggesting small bowel obstruction. PERITONEUM AND SOFT TISSUES: No abnormal calcifications. BONES: Lumbar spine fixation hardware. VASCULATURE: Thoracic aortic atherosclerosis. LINES AND TUBES: Enteric tube terminates in the proximal gastric body with side port just below the GE junction. Consider advancement of 4 cm if clinically warranted. IMPRESSION: 1. Enteric tube terminates in the proximal gastric body with side port just below the GE junction. Consider advancement of 4 cm if clinically warranted. 2. Dilated central small bowel, suggesting small bowel obstruction. Electronically signed by: Zadie Herter MD 11/14/2023 08:05 PM EDT RP Workstation: [id number]   DG Abd Portable 1 View Result Date: 11/14/2023 CLINICAL DATA:  NG tube placement. EXAM: PORTABLE ABDOMEN - 1 VIEW COMPARISON:  March 01, 2022 FINDINGS: A nasogastric tube is seen with its distal tip positioned just beyond the  gastroesophageal junction. The distal side hole sits within the expected region of the distal esophagus. Numerous dilated, air-filled small bowel loops are seen throughout the abdomen and pelvis. No radio-opaque calculi are seen. Postoperative changes are present throughout the lumbar spine. IMPRESSION: 1. Nasogastric tube positioning, as described above. Further advancement by proximally 7 cm is recommended. 2. Findings consistent with a small bowel obstruction. Electronically Signed   By: Virgle Grime M.D.   On: 11/14/2023 16:26   CT ABDOMEN PELVIS W CONTRAST Result Date: 11/14/2023 CLINICAL DATA:  Abdomen pain constipation emesis EXAM: CT ABDOMEN AND PELVIS WITH CONTRAST TECHNIQUE: Multidetector CT imaging of the abdomen and pelvis was performed using the standard protocol following bolus administration of intravenous contrast. RADIATION DOSE REDUCTION: This exam was  performed according to the departmental dose-optimization program which includes automated exposure control, adjustment of the mA and/or kV according to patient size and/or use of iterative reconstruction technique. CONTRAST:  OMNIPAQUE  IOHEXOL  300 MG/ML  SOLN COMPARISON:  CT 01/04/2023, 03/01/2019 CT, 03/28/2021 FINDINGS: Lower chest: Lung bases demonstrate emphysema. No acute airspace disease. Partially visualized breast implants. Hepatobiliary: Distended gallbladder. No calcified stone. Slight central intra hepatic biliary dilatation. Common bile duct diameter at the head of the pancreas measuring 7 mm. Pancreas: No inflammation. Mild dilatation of pancreatic duct measuring up to 5 mm at the body, series 2, image 24. Spleen: Normal in size without focal abnormality. Adrenals/Urinary Tract: Adrenal glands are normal. Kidneys show no hydronephrosis. The bladder is unremarkable Stomach/Bowel: Moderate fluid distension of stomach. Multiple dilated fluid-filled loops of small bowel measuring up to 4.8 cm and consistent with high-grade bowel obstruction. Several suspected transition points, for example left lower quadrant, series 2, image 42 where there is a decompressed segment of small bowel with mild wall thickening and upstream small bowel distension, relatively decompressed small bowel segment distal to surgical anastomosis in the right lower quadrant, coronal series 4 image 44 through 50. Completely decompressed distal small bowel in the pelvis. Vascular/Lymphatic: Aortic atherosclerosis. No enlarged abdominal or pelvic lymph nodes. Reproductive: Hysterectomy.  No adnexal mass. Other: No free air. Small volume fluid in the pelvis. Left posterolateral hernia contains fat and a short segment of dilated fluid-filled bowel but there appears to be fluid-filled dilated bowel both proximal and distal to the hernia. Musculoskeletal: Mild scoliosis. Spinal fusion hardware L1 through L5. No acute osseous abnormality  IMPRESSION: 1. Findings consistent with high-grade small bowel obstruction, several areas of suspected transition within the small bowel with segments of decompressed small bowel that exhibit mild wall thickening. Distal small bowel in the pelvis is completely decompressed. Findings could be secondary to adhesive disease. A left posterolateral hernia containing fat and short segment of dilated fluid-filled bowel is noted, but there appears to be fluid-filled distended bowel both proximal and distal to this hernia. 2. Trace volume of free fluid.  No free air. 3. Emphysema 4. Distended gallbladder. Mild central intra hepatic biliary dilatation and slight dilatation of pancreatic duct. Correlate with LFTs with follow-up MRCP if indicated Electronically Signed   By: Esmeralda Hedge M.D.   On: 11/14/2023 15:45   Scheduled Meds:  bisacodyl   10 mg Rectal QHS   heparin   5,000 Units Subcutaneous Q8H   sodium chloride  flush  3 mL Intravenous Q12H   sodium chloride  flush  3 mL Intravenous Q12H   Continuous Infusions:  sodium chloride  83 mL/hr at 11/15/23 0735   sodium chloride      famotidine (PEPCID) IV Stopped (11/14/23 2240)  LOS: 1 day   Colin Dawley M.D on 11/15/2023 at 1:52 PM  Go to [email or web address] - for contact info  [facility] - Office  [phone]  If 7PM-7AM, please contact night-coverage [email or web address] 11/15/2023, 1:52 PM

## 2023-11-15 NOTE — Plan of Care (Signed)
  Problem: Education: Goal: Knowledge of General Education information will improve Description: Including pain rating scale, medication(s)/side effects and non-pharmacologic comfort measures Outcome: Progressing   Problem: Elimination: Goal: Will not experience complications related to bowel motility Outcome: Progressing   Problem: Pain Managment: Goal: General experience of comfort will improve and/or be controlled Outcome: Progressing

## 2023-11-15 NOTE — Progress Notes (Signed)
 Subjective: Patient feels much better and has no abdominal pain.  She has had multiple bowel movements overnight.  Objective: Vital signs in last 24 hours: Temp:  [98 F (36.7 C)-99.4 F (37.4 C)] 99.4 F (37.4 C) (06/19 0353) Pulse Rate:  [71-92] 73 (06/19 0522) Resp:  [18-22] 18 (06/19 0353) BP: (142-166)/(66-114) 142/71 (06/19 0522) SpO2:  [79 %-98 %] 94 % (06/19 0353) Weight:  [49.9 kg] 49.9 kg (06/18 1116) Last BM Date : 11/13/23  Intake/Output from previous day: 06/18 0701 - 06/19 0700 In: 218.3 [I.V.:40.1; IV Piggyback:178.2] Out: 500 [Emesis/NG output:500] Intake/Output this shift: No intake/output data recorded.  General appearance: alert, cooperative, and no distress GI: soft, non-tender; bowel sounds normal; no masses,  no organomegaly  Lab Results:  Recent Labs    11/14/23 1205  WBC 7.8  HGB 15.6*  HCT 48.1*  PLT 234   BMET Recent Labs    11/14/23 1205 11/15/23 0403  NA 142 141  K 2.9* 3.0*  CL 90* 100  CO2 35* 32  GLUCOSE 125* 104*  BUN 17 18  CREATININE 0.84 0.75  CALCIUM  11.1* 10.3   PT/INR No results for input(s): LABPROT, INR in the last 72 hours.  Studies/Results: DG Abd 1 View Result Date: 11/15/2023 EXAM: 1 VIEW XRAY OF THE ABDOMEN SUPINE 11/15/2023 12:29:00 AM COMPARISON: 11/14/2023 CLINICAL HISTORY: Encounter for imaging study to confirm nasogastric (NG) tube placement. FINDINGS: BOWEL: Nonobstructive bowel gas pattern. PERITONEUM AND SOFT TISSUES: No abnormal calcifications. BONES: No acute osseous abnormality. LINES AND TUBES: Enteric tube terminates in the proximal stomach with its side port at the gastroesophageal junction. This is unchanged from prior. IMPRESSION: 1. Enteric tube terminates in the proximal stomach with its side port at the GE junction, unchanged. If used for decompression, this is likely satisfactory position. Electronically signed by: Zadie Herter MD 11/15/2023 12:34 AM EDT RP Workstation: [id number]   DG  Abd 2 Views Result Date: 11/14/2023 EXAM: 2 VIEW XRAY OF THE ABDOMEN SUPINE 11/14/2023 05:59:00 PM COMPARISON: Earlier today. CLINICAL HISTORY: [id number] Encounter for imaging study to confirm nasogastric (NG) tube placement [id number]. NG tube placement. FINDINGS: BOWEL: Dilated central small bowel, suggesting small bowel obstruction. PERITONEUM AND SOFT TISSUES: No abnormal calcifications. BONES: Lumbar spine fixation hardware. VASCULATURE: Thoracic aortic atherosclerosis. LINES AND TUBES: Enteric tube terminates in the proximal gastric body with side port just below the GE junction. Consider advancement of 4 cm if clinically warranted. IMPRESSION: 1. Enteric tube terminates in the proximal gastric body with side port just below the GE junction. Consider advancement of 4 cm if clinically warranted. 2. Dilated central small bowel, suggesting small bowel obstruction. Electronically signed by: Zadie Herter MD 11/14/2023 08:05 PM EDT RP Workstation: [id number]   DG Abd Portable 1 View Result Date: 11/14/2023 CLINICAL DATA:  NG tube placement. EXAM: PORTABLE ABDOMEN - 1 VIEW COMPARISON:  March 01, 2022 FINDINGS: A nasogastric tube is seen with its distal tip positioned just beyond the gastroesophageal junction. The distal side hole sits within the expected region of the distal esophagus. Numerous dilated, air-filled small bowel loops are seen throughout the abdomen and pelvis. No radio-opaque calculi are seen. Postoperative changes are present throughout the lumbar spine. IMPRESSION: 1. Nasogastric tube positioning, as described above. Further advancement by proximally 7 cm is recommended. 2. Findings consistent with a small bowel obstruction. Electronically Signed   By: Virgle Grime M.D.   On: 11/14/2023 16:26   CT ABDOMEN PELVIS W CONTRAST Result Date: 11/14/2023 CLINICAL DATA:  Abdomen pain constipation  emesis EXAM: CT ABDOMEN AND PELVIS WITH CONTRAST TECHNIQUE: Multidetector CT imaging of the abdomen and  pelvis was performed using the standard protocol following bolus administration of intravenous contrast. RADIATION DOSE REDUCTION: This exam was performed according to the departmental dose-optimization program which includes automated exposure control, adjustment of the mA and/or kV according to patient size and/or use of iterative reconstruction technique. CONTRAST:  OMNIPAQUE  IOHEXOL  300 MG/ML  SOLN COMPARISON:  CT 01/04/2023, 03/01/2019 CT, 03/28/2021 FINDINGS: Lower chest: Lung bases demonstrate emphysema. No acute airspace disease. Partially visualized breast implants. Hepatobiliary: Distended gallbladder. No calcified stone. Slight central intra hepatic biliary dilatation. Common bile duct diameter at the head of the pancreas measuring 7 mm. Pancreas: No inflammation. Mild dilatation of pancreatic duct measuring up to 5 mm at the body, series 2, image 24. Spleen: Normal in size without focal abnormality. Adrenals/Urinary Tract: Adrenal glands are normal. Kidneys show no hydronephrosis. The bladder is unremarkable Stomach/Bowel: Moderate fluid distension of stomach. Multiple dilated fluid-filled loops of small bowel measuring up to 4.8 cm and consistent with high-grade bowel obstruction. Several suspected transition points, for example left lower quadrant, series 2, image 42 where there is a decompressed segment of small bowel with mild wall thickening and upstream small bowel distension, relatively decompressed small bowel segment distal to surgical anastomosis in the right lower quadrant, coronal series 4 image 44 through 50. Completely decompressed distal small bowel in the pelvis. Vascular/Lymphatic: Aortic atherosclerosis. No enlarged abdominal or pelvic lymph nodes. Reproductive: Hysterectomy.  No adnexal mass. Other: No free air. Small volume fluid in the pelvis. Left posterolateral hernia contains fat and a short segment of dilated fluid-filled bowel but there appears to be fluid-filled dilated  bowel both proximal and distal to the hernia. Musculoskeletal: Mild scoliosis. Spinal fusion hardware L1 through L5. No acute osseous abnormality IMPRESSION: 1. Findings consistent with high-grade small bowel obstruction, several areas of suspected transition within the small bowel with segments of decompressed small bowel that exhibit mild wall thickening. Distal small bowel in the pelvis is completely decompressed. Findings could be secondary to adhesive disease. A left posterolateral hernia containing fat and short segment of dilated fluid-filled bowel is noted, but there appears to be fluid-filled distended bowel both proximal and distal to this hernia. 2. Trace volume of free fluid.  No free air. 3. Emphysema 4. Distended gallbladder. Mild central intra hepatic biliary dilatation and slight dilatation of pancreatic duct. Correlate with LFTs with follow-up MRCP if indicated Electronically Signed   By: Esmeralda Hedge M.D.   On: 11/14/2023 15:45    Anti-infectives: Anti-infectives (From admission, onward)    None       Assessment/Plan: Impression: Small bowel obstruction, resolving.  Patient has had multiple bowel movements overnight. Plan: I still will get a small bowel obstruction protocol study today as her small bowel dilatation was significant and extensive.  She does not need acute surgical intervention.  Should the study be negative, the NG tube can be removed and she could be started on a diet.  This was discussed with the patient who understands and agrees.  LOS: 1 day    Alanda Allegra 11/15/2023

## 2023-11-15 NOTE — Progress Notes (Incomplete)
 Patient stated she put an ice chip in her mouth to dissolve and it slid to the L side of her throat. Patient c/o having difficulty to swallow. Some blood noted to be coming out of Ng tube. Order for chest xray placed.

## 2023-11-16 ENCOUNTER — Inpatient Hospital Stay (HOSPITAL_COMMUNITY)

## 2023-11-16 DIAGNOSIS — K56609 Unspecified intestinal obstruction, unspecified as to partial versus complete obstruction: Secondary | ICD-10-CM | POA: Diagnosis not present

## 2023-11-16 DIAGNOSIS — E7849 Other hyperlipidemia: Secondary | ICD-10-CM

## 2023-11-16 DIAGNOSIS — J438 Other emphysema: Secondary | ICD-10-CM | POA: Diagnosis not present

## 2023-11-16 DIAGNOSIS — I10 Essential (primary) hypertension: Secondary | ICD-10-CM | POA: Diagnosis not present

## 2023-11-16 LAB — BASIC METABOLIC PANEL WITH GFR
Anion gap: 11 (ref 5–15)
BUN: 11 mg/dL (ref 8–23)
CO2: 29 mmol/L (ref 22–32)
Calcium: 9 mg/dL (ref 8.9–10.3)
Chloride: 102 mmol/L (ref 98–111)
Creatinine, Ser: 0.44 mg/dL (ref 0.44–1.00)
GFR, Estimated: >60 mL/min (ref >60)
Glucose, Bld: 95 mg/dL (ref 70–99)
Potassium: 2.9 mmol/L — ABNORMAL LOW (ref 3.5–5.1)
Sodium: 142 mmol/L (ref 135–145)

## 2023-11-16 LAB — GLUCOSE, CAPILLARY
Glucose-Capillary: 100 mg/dL — ABNORMAL HIGH (ref 70–99)
Glucose-Capillary: 132 mg/dL — ABNORMAL HIGH (ref 70–99)

## 2023-11-16 MED ORDER — OXYCODONE HCL 5 MG PO TABS
10.0000 mg | ORAL_TABLET | Freq: Once | ORAL | Status: AC
  Administered 2023-11-16: 10 mg via ORAL
  Filled 2023-11-16: qty 2

## 2023-11-16 MED ORDER — TRAZODONE HCL 50 MG PO TABS
50.0000 mg | ORAL_TABLET | Freq: Every evening | ORAL | 3 refills | Status: DC | PRN
Start: 2023-11-16 — End: 2024-01-07

## 2023-11-16 MED ORDER — POTASSIUM CHLORIDE CRYS ER 20 MEQ PO TBCR: 20.0000 meq | EXTENDED_RELEASE_TABLET | Freq: Every day | ORAL | 0 refills | Status: AC

## 2023-11-16 MED ORDER — BISACODYL 10 MG RE SUPP: 10.0000 mg | Freq: Every day | RECTAL | 0 refills | Status: AC

## 2023-11-16 MED ORDER — GADOBUTROL 1 MMOL/ML IV SOLN
5.0000 mL | Freq: Once | INTRAVENOUS | Status: AC | PRN
  Administered 2023-11-16: 5 mL via INTRAVENOUS

## 2023-11-16 MED ORDER — POLYETHYLENE GLYCOL 3350 17 G PO PACK: 17.0000 g | PACK | Freq: Every day | ORAL | 0 refills | Status: AC | PRN

## 2023-11-16 MED ORDER — ROSUVASTATIN CALCIUM 10 MG PO TABS: 10.0000 mg | ORAL_TABLET | Freq: Every day | ORAL | 11 refills | Status: AC

## 2023-11-16 MED ORDER — PANTOPRAZOLE SODIUM 40 MG PO TBEC: 40.0000 mg | DELAYED_RELEASE_TABLET | Freq: Every day | ORAL | 5 refills | Status: DC

## 2023-11-16 MED ORDER — POTASSIUM CHLORIDE CRYS ER 20 MEQ PO TBCR
40.0000 meq | EXTENDED_RELEASE_TABLET | ORAL | Status: DC
  Administered 2023-11-16: 40 meq via ORAL
  Filled 2023-11-16: qty 2

## 2023-11-16 MED ORDER — AMLODIPINE BESYLATE 5 MG PO TABS: 5.0000 mg | ORAL_TABLET | Freq: Every day | ORAL | 11 refills | Status: AC

## 2023-11-16 NOTE — Progress Notes (Signed)
 Subjective: Patient has had multiple bowel movements.  No abdominal pain noted.  Objective: Vital signs in last 24 hours: Temp:  [97.7 F (36.5 C)-98.9 F (37.2 C)] 98.3 F (36.8 C) (06/20 0500) Pulse Rate:  [59-70] 59 (06/20 0828) Resp:  [16-20] 18 (06/20 0500) BP: (134-166)/(63-71) 134/63 (06/20 0828) SpO2:  [95 %-100 %] 95 % (06/20 0828) Last BM Date : 11/15/23  Intake/Output from previous day: 06/19 0701 - 06/20 0700 In: 1686.1 [I.V.:1520.6; IV Piggyback:165.5] Out: 1400 [Emesis/NG output:1400] Intake/Output this shift: Total I/O In: 395.6 [I.V.:395.6] Out: -   General appearance: alert, cooperative, and no distress GI: soft, non-tender; bowel sounds normal; no masses,  no organomegaly  Lab Results:  Recent Labs    11/14/23 1205  WBC 7.8  HGB 15.6*  HCT 48.1*  PLT 234   BMET Recent Labs    11/15/23 0403 11/16/23 0403  NA 141 142  K 3.0* 2.9*  CL 100 102  CO2 32 29  GLUCOSE 104* 95  BUN 18 11  CREATININE 0.75 0.44  CALCIUM  10.3 9.0   PT/INR No results for input(s): LABPROT, INR in the last 72 hours.  Studies/Results: DG Abd Portable 1V-Small Bowel Obstruction Protocol-initial, 8 hr delay Result Date: 11/15/2023 CLINICAL DATA:  8Hr Delay Small Bowel F/u EXAM: PORTABLE ABDOMEN - 1 VIEW COMPARISON:  CT abdomen pelvis 11/14/2023 FINDINGS: Enteric tube with tip overlying the expected region of the gastric lumen and side port overlying the expected region of the gastroesophageal junction. Persistent gaseous dilatation of several loops of small bowel. PO contrast opacifies the small bowel. Question possible PO contrast within the large bowel. No radio-opaque calculi or other significant radiographic abnormality are seen. Lumbar surgical hardware. Vascular calcifications. IMPRESSION: Enteric tube with tip overlying the expected region of the gastric lumen and side port overlying the expected region of the gastroesophageal junction. Recommend advancement by 2  cm. PO contrast opacifies the small bowel. Question possible PO contrast within the large bowel. Persistent gaseous dilatation of several loops of small bowel consistent with small-bowel obstruction. Electronically Signed   By: Morgane  Naveau M.D.   On: 11/15/2023 21:12   DG Abd 1 View Result Date: 11/15/2023 EXAM: 1 VIEW XRAY OF THE ABDOMEN SUPINE 11/15/2023 12:29:00 AM COMPARISON: 11/14/2023 CLINICAL HISTORY: Encounter for imaging study to confirm nasogastric (NG) tube placement. FINDINGS: BOWEL: Nonobstructive bowel gas pattern. PERITONEUM AND SOFT TISSUES: No abnormal calcifications. BONES: No acute osseous abnormality. LINES AND TUBES: Enteric tube terminates in the proximal stomach with its side port at the gastroesophageal junction. This is unchanged from prior. IMPRESSION: 1. Enteric tube terminates in the proximal stomach with its side port at the GE junction, unchanged. If used for decompression, this is likely satisfactory position. Electronically signed by: Zadie Herter MD 11/15/2023 12:34 AM EDT RP Workstation: [id number]   DG Abd 2 Views Result Date: 11/14/2023 EXAM: 2 VIEW XRAY OF THE ABDOMEN SUPINE 11/14/2023 05:59:00 PM COMPARISON: Earlier today. CLINICAL HISTORY: [id number] Encounter for imaging study to confirm nasogastric (NG) tube placement [id number]. NG tube placement. FINDINGS: BOWEL: Dilated central small bowel, suggesting small bowel obstruction. PERITONEUM AND SOFT TISSUES: No abnormal calcifications. BONES: Lumbar spine fixation hardware. VASCULATURE: Thoracic aortic atherosclerosis. LINES AND TUBES: Enteric tube terminates in the proximal gastric body with side port just below the GE junction. Consider advancement of 4 cm if clinically warranted. IMPRESSION: 1. Enteric tube terminates in the proximal gastric body with side port just below the GE junction. Consider advancement of 4 cm if clinically warranted. 2.  Dilated central small bowel, suggesting small bowel obstruction.  Electronically signed by: Zadie Herter MD 11/14/2023 08:05 PM EDT RP Workstation: [id number]   DG Abd Portable 1 View Result Date: 11/14/2023 CLINICAL DATA:  NG tube placement. EXAM: PORTABLE ABDOMEN - 1 VIEW COMPARISON:  March 01, 2022 FINDINGS: A nasogastric tube is seen with its distal tip positioned just beyond the gastroesophageal junction. The distal side hole sits within the expected region of the distal esophagus. Numerous dilated, air-filled small bowel loops are seen throughout the abdomen and pelvis. No radio-opaque calculi are seen. Postoperative changes are present throughout the lumbar spine. IMPRESSION: 1. Nasogastric tube positioning, as described above. Further advancement by proximally 7 cm is recommended. 2. Findings consistent with a small bowel obstruction. Electronically Signed   By: Virgle Grime M.D.   On: 11/14/2023 16:26   CT ABDOMEN PELVIS W CONTRAST Result Date: 11/14/2023 CLINICAL DATA:  Abdomen pain constipation emesis EXAM: CT ABDOMEN AND PELVIS WITH CONTRAST TECHNIQUE: Multidetector CT imaging of the abdomen and pelvis was performed using the standard protocol following bolus administration of intravenous contrast. RADIATION DOSE REDUCTION: This exam was performed according to the departmental dose-optimization program which includes automated exposure control, adjustment of the mA and/or kV according to patient size and/or use of iterative reconstruction technique. CONTRAST:  OMNIPAQUE  IOHEXOL  300 MG/ML  SOLN COMPARISON:  CT 01/04/2023, 03/01/2019 CT, 03/28/2021 FINDINGS: Lower chest: Lung bases demonstrate emphysema. No acute airspace disease. Partially visualized breast implants. Hepatobiliary: Distended gallbladder. No calcified stone. Slight central intra hepatic biliary dilatation. Common bile duct diameter at the head of the pancreas measuring 7 mm. Pancreas: No inflammation. Mild dilatation of pancreatic duct measuring up to 5 mm at the body, series 2,  image 24. Spleen: Normal in size without focal abnormality. Adrenals/Urinary Tract: Adrenal glands are normal. Kidneys show no hydronephrosis. The bladder is unremarkable Stomach/Bowel: Moderate fluid distension of stomach. Multiple dilated fluid-filled loops of small bowel measuring up to 4.8 cm and consistent with high-grade bowel obstruction. Several suspected transition points, for example left lower quadrant, series 2, image 42 where there is a decompressed segment of small bowel with mild wall thickening and upstream small bowel distension, relatively decompressed small bowel segment distal to surgical anastomosis in the right lower quadrant, coronal series 4 image 44 through 50. Completely decompressed distal small bowel in the pelvis. Vascular/Lymphatic: Aortic atherosclerosis. No enlarged abdominal or pelvic lymph nodes. Reproductive: Hysterectomy.  No adnexal mass. Other: No free air. Small volume fluid in the pelvis. Left posterolateral hernia contains fat and a short segment of dilated fluid-filled bowel but there appears to be fluid-filled dilated bowel both proximal and distal to the hernia. Musculoskeletal: Mild scoliosis. Spinal fusion hardware L1 through L5. No acute osseous abnormality IMPRESSION: 1. Findings consistent with high-grade small bowel obstruction, several areas of suspected transition within the small bowel with segments of decompressed small bowel that exhibit mild wall thickening. Distal small bowel in the pelvis is completely decompressed. Findings could be secondary to adhesive disease. A left posterolateral hernia containing fat and short segment of dilated fluid-filled bowel is noted, but there appears to be fluid-filled distended bowel both proximal and distal to this hernia. 2. Trace volume of free fluid.  No free air. 3. Emphysema 4. Distended gallbladder. Mild central intra hepatic biliary dilatation and slight dilatation of pancreatic duct. Correlate with LFTs with follow-up  MRCP if indicated Electronically Signed   By: Esmeralda Hedge M.D.   On: 11/14/2023 15:45    Anti-infectives: Anti-infectives (  From admission, onward)    None       Assessment/Plan: Impression: Small bowel obstruction, resolving.  Patient had a follow-up KUB this morning which shows most of the contrast has dissipated.  There is contrast in the colon.  Her slow transit may be secondary to her chronic opioid use as well as adhesions.  At this point, we will remove NG tube and start a soft diet.  She is aware that if this recurs, she will need exploratory laparotomy.  If she tolerates the diet well, she is okay for discharge from surgery standpoint.  LOS: 2 days    Alanda Allegra 11/16/2023

## 2023-11-16 NOTE — Care Management Important Message (Signed)
 Important Message  Patient Details  Name: Terri Harris MRN: [id number] Date of Birth: 06/03/1940   Important Message Given:  Yes - Medicare IM     Akila Batta L Reese Senk 11/16/2023, 9:40 AM

## 2023-11-16 NOTE — Plan of Care (Signed)

## 2023-11-16 NOTE — Plan of Care (Signed)
   Problem: Education: Goal: Knowledge of General Education information will improve Description Including pain rating scale, medication(s)/side effects and non-pharmacologic comfort measures Outcome: Progressing

## 2023-11-16 NOTE — Discharge Instructions (Signed)
 1)Avoid ibuprofen /Advil /Aleve /Motrin Juluis Ok Powders/Naproxen /BC powders/Meloxicam/Diclofenac /Indomethacin and other Nonsteroidal anti-inflammatory medications as these will make you more likely to bleed and can cause stomach ulcers, can also cause Kidney problems.   2)Follow up General surgeon Dr. Alanda Allegra if persistent vomiting abdominal pain or lack of bowel movement--  3)Avoid Constipation  4)Repeat CBC and BMP blood tests Next week

## 2023-11-16 NOTE — Discharge Summary (Signed)
 Terri Harris, is a 83 y.o. female  DOB 05/12/1941  MRN [id number].  Admission date:  11/14/2023  Admitting Physician  Sugey Trevathan Pearlean, MD  Discharge Date:  11/16/2023   Primary MD  Alphonsa Glendia LABOR, MD  Recommendations for primary care physician for things to follow:  1)Avoid ibuprofen /Advil /Aleve /Motrin Josefine Powders/Naproxen /BC powders/Meloxicam/Diclofenac /Indomethacin and other Nonsteroidal anti-inflammatory medications as these will make you more likely to bleed and can cause stomach ulcers, can also cause Kidney problems.   2)Follow up General surgeon Dr. Oneil Budge if persistent vomiting abdominal pain or lack of bowel movement--  3)Avoid Constipation  4)Repeat CBC and BMP blood tests Next week  Admission Diagnosis  Hypokalemia [E87.6] SBO (small bowel obstruction) (HCC) [K56.609]   Discharge Diagnosis  Hypokalemia [E87.6] SBO (small bowel obstruction) (HCC) [K56.609]    Active Problems:   Colonic obstruction (HCC)   SBO (small bowel obstruction) (HCC)   COPD (chronic obstructive pulmonary disease) (HCC)   GERD (gastroesophageal reflux disease)   HTN (hypertension)   Hyperlipidemia      Past Medical History:  Diagnosis Date   ANA positive 01/2001   Arthritis    Arthritis 07/02/2013   ANA positive   Cancer (HCC)    partial hysterectomy age 33-uterine cancer   Coronary artery calcification 09/28/2021   GERD (gastroesophageal reflux disease)    Hx of tobacco use, presenting hazards to health 07/02/2013   Hypercholesteremia    Hypertension    Ileus, postoperative (HCC) 07/02/2013   Osteoporosis    Pneumonia    hx of   SBO (small bowel obstruction) (HCC) 06/20/2103    Past Surgical History:  Procedure Laterality Date   ABDOMINAL HYSTERECTOMY     ANTERIOR LAT LUMBAR FUSION N/A 06/08/2014   Procedure: LUMBAR 2-3 LUMBAR 3-4 LUMBAR 4-5 ANTEROLATERAL DECOMPRESSION/FUSION WITH PERCUTANEOUS  PEDICLE SCREWS;  Surgeon: Victory Gens, MD;  Location: [facility];  Service: Neurosurgery;  Laterality: N/A;  L2-3 L3-4 L4-5 ANTEROLATERAL DECOMPRESSION/FUSION WITH PERCUTANEOUS PEDICLE SCREWS   ANTERIOR LAT LUMBAR FUSION Left 01/07/2019   Procedure: Lumbar one-two Anterolateral lumbar interbody fusion with lateral plate fixation;  Surgeon: Gens Victory, MD;  Location[facility];  Service: Neurosurgery;  Laterality: Left;   BACK SURGERY     BOWEL RESECTION N/A 06/20/2013   Procedure: SMALL BOWEL RESECTION;  Surgeon: Vicenta LABOR Poli, MD;  Location: [facility] OR;  Service: General;  Laterality: N/A;   COLON SURGERY     COLONOSCOPY  10/25/2011   Procedure: COLONOSCOPY;  Surgeon: Claudis RAYMOND Rivet, MD;  Location: [facility];  Service: Endoscopy;  Laterality: N/A;  930   COLONOSCOPY N/A 05/09/2017   Procedure: COLONOSCOPY;  Surgeon: Rivet Claudis RAYMOND, MD;  Location: [facility];  Service: Endoscopy;  Laterality: N/A;  1030   ESOPHAGEAL DILATION N/A 05/25/2015   Procedure: ESOPHAGEAL DILATION;  Surgeon: Claudis RAYMOND Rivet, MD;  Location: [facility];  Service: Endoscopy;  Laterality: N/A;   ESOPHAGOGASTRODUODENOSCOPY  02/2009   ESOPHAGOGASTRODUODENOSCOPY N/A 05/25/2015   Procedure: ESOPHAGOGASTRODUODENOSCOPY (EGD);  Surgeon: Claudis RAYMOND Rivet, MD;  Location:  [facility];  Service: Endoscopy;  Laterality: N/A;   HERNIA REPAIR Left    groin   INGUINAL HERNIA REPAIR Left 09/11/2013   Procedure: HERNIA REPAIR INGUINAL INCARCERATED;  Surgeon: Vicenta DELENA Poli, MD;  Location: [facility] OR;  Service: General;  Laterality: Left;   LAPAROTOMY N/A 06/20/2013   Procedure: EXPLORATORY LAPAROTOMY;  Surgeon: Vicenta DELENA Poli, MD;  Location: [facility] OR;  Service: General;  Laterality: N/A;   LUMBAR PERCUTANEOUS PEDICLE SCREW 3 LEVEL N/A 06/08/2014   Procedure: LUMBAR PERCUTANEOUS PEDICLE SCREW 3 LEVEL;  Surgeon: Victory Gens, MD;  Location: [facility];  Service: Neurosurgery;  Laterality: N/A;   POLYPECTOMY  05/09/2017    Procedure: POLYPECTOMY;  Surgeon: Golda Claudis PENNER, MD;  Location: [facility];  Service: Endoscopy;;  sigmoid colon   TONSILLECTOMY       HPI  from the history and physical done on the day of admission:   HPI: Terri Harris is a 83 y.o. female reformed smoker with past medical history relevant for GERD, HLD, history of B12 deficiency on chronic B12 supplementation, HTN, chronic back pain pain and chronic pain syndrome with chronic narcotic use history of recurrent small bowel obstruction--now presents to the ED with abdominal pain, constipation concerns, says abdomen is bloated had emesis with ingested food but no bile no blood -Patient reports recurrent episodes of SBO since having abdominal surgery back in 2015, pleasestatus post prior abdominal hysterectomy status post multiple back surgeries, status post prior partial resection of her colon, status post prior hernia repair. No fever  Or chills  - No urinary symptoms no flank pain - Had mushy BM on 11/13/2023 on 11/14/2023 - No chest pain, no palpitations, no dizziness, no significant dyspnea - In the ED WBC 7.8, hemoglobin 15.6 platelets 234,,  - Potassium is low at 2.9 magnesium  not low, chloride 90, sodium 142, bicarb 35, glucose 125, creatinine 0.8 with a BUN of 17, LFTs are not elevated, lipase WNL - CT abdomen and pelvis with--Findings consistent with high-grade small bowel obstruction, several areas of suspected transition within the small bowel with segments of decompressed small bowel that exhibit mild wall thickening. Distal small bowel in the pelvis is completely decompressed. Findings could be secondary to adhesive disease. Aleft posterolateral hernia containing fat and short segment of dilated fluid-filled bowel is noted, but there appears to be fluid-filled distended bowel both proximal and distal to this hernia. -Also consistent with emphysema and distended gallbladder without acute cholecystitis noted -. Distended gallbladder.  Mild central intra hepatic biliary dilatation and slight dilatation of pancreatic duct. Correlate with LFTs with follow-up MRCP if indicated   Review of Systems: As mentioned in the history of present illness. All other systems reviewed and are negative.   Hospital Course:     Brief Narrative:  83 y.o. female reformed smoker with past medical history relevant for GERD, HLD, history of B12 deficiency on chronic B12 supplementation, HTN, chronic back pain pain and chronic pain syndrome with chronic narcotic use history of recurrent small bowel obstruction and chronic constipation admitted on 11/14/2023 with SBO     -Assessment and Plan: 1)Recurrent SBO --- CT abdomen and pelvis findings of SBO noted - General Surgery consult appreciated - initially required NG tube , NG tube was removed -- Conservative management recommended by general surgeon at this time, may need surgery if she fails - Treated with As needed antiemetics, -Treated with  IV fluids , and as needed pain control opiates - As per general  surgeon follow-up x-rays after Gastrografin  small bowel follow-through study is reassuring -Patient tolerating oral intake -, Patient having BMs -Okay to DC home with follow-up with general surgeon as needed   2)AbNormal Gallbladder imaging--. Distended gallbladder. Mild central intra hepatic biliary dilatation and slight dilatation of pancreatic duct. Correlate with LFTs with follow-up MRCP if indicated --- Largely asymptomatic from a gallbladder standpoint - Unable to do MRCP due to lumbar hardware -follow up with General surgeon as outpt if symptoms re-occur   3)COPD--no acute exacerbation at this time - As needed bronchodilators as ordered   4)HTN--d/c home on oral Amlodipine    5)Hypokalemia--magnesium  WNL - Suspect hypokalemia due to emesis/GI losses- -additional potassium replacements   6) chronic pain syndrome/chronic back pain--- patient is chronically on opiates which is  probably contributing to her constipation and SBO issues   7)GERD--discharged on Protonix   8)HLD----c/n crestor    Disposition: The patient is from: Home              Anticipated d/c is to: Home  Discharge Condition: stable  Follow UP with General Surgeon Dr. Mavis  Consults obtained -General Surgeon  Diet and Activity recommendation:  As advised  Discharge Instructions    Discharge Instructions     Call MD for:  difficulty breathing, headache or visual disturbances   Complete by: As directed    Call MD for:  persistant dizziness or light-headedness   Complete by: As directed    Call MD for:  persistant nausea and vomiting   Complete by: As directed    Call MD for:  temperature >100.4   Complete by: As directed    Diet - low sodium heart healthy   Complete by: As directed    Discharge instructions   Complete by: As directed    1)Avoid ibuprofen /Advil /Aleve /Motrin Josefine Powders/Naproxen /BC powders/Meloxicam/Diclofenac /Indomethacin and other Nonsteroidal anti-inflammatory medications as these will make you more likely to bleed and can cause stomach ulcers, can also cause Kidney problems.   2)Follow up General surgeon Dr. Oneil Mavis if persistent vomiting abdominal pain or lack of bowel movement--  3)Avoid Constipation  4)Repeat CBC and BMP blood tests Next week   Increase activity slowly   Complete by: As directed        Discharge Medications     Allergies as of 11/16/2023       Reactions   Codeine Nausea And Vomiting   Fosamax  [alendronate  Sodium] Other (See Comments)   Gastritis         Medication List     TAKE these medications    amLODipine  5 MG tablet Commonly known as: NORVASC  Take 1 tablet (5 mg total) by mouth daily.   bisacodyl  10 MG suppository Commonly known as: DULCOLAX Place 1 suppository (10 mg total) rectally at bedtime.   pantoprazole  40 MG tablet Commonly known as: Protonix  Take 1 tablet (40 mg total) by mouth daily.    polyethylene glycol 17 g packet Commonly known as: MIRALAX  / GLYCOLAX  Take 17 g by mouth daily as needed for mild constipation.   potassium chloride  SA 20 MEQ tablet Commonly known as: KLOR-CON  M Take 1 tablet (20 mEq total) by mouth daily for 5 days.   rosuvastatin  10 MG tablet Commonly known as: Crestor  Take 1 tablet (10 mg total) by mouth daily.   traZODone  50 MG tablet Commonly known as: DESYREL  Take 1 tablet (50 mg total) by mouth at bedtime as needed for sleep.       Major procedures and Radiology Reports - PLEASE review  detailed and final reports for all details, in brief -   DG Abd 1 View Result Date: 11/16/2023 CLINICAL DATA:  [id number] SBO (small bowel obstruction) (HCC) [id number] EXAM: ABDOMEN - 1 VIEW COMPARISON:  November 15, 2023 FINDINGS: Esophagogastric tube terminates in the stomach. The last side hole is in the region of the distal esophagus/GE junction. Interval passage of the enteric contrast with a small amount of residual contrast noted in the colon. Nonobstructive bowel gas pattern. No pneumoperitoneum. No organomegaly or radiopaque calculi. No acute fracture or destructive lesion. The lung bases are clear.Lumbar fusion hardware again noted. IMPRESSION: 1. Esophagogastric tube terminates in the stomach. The last side hole is in the region of the distal esophagus/GE junction. If clinically warranted, 2.5 cm of advancement would be recommended for optimal positioning. 2. Interval passage of the enteric contrast, with a small amount of residual contrast in the colon. Electronically Signed   By: Rogelia Myers M.D.   On: 11/16/2023 10:56   DG Abd Portable 1V-Small Bowel Obstruction Protocol-initial, 8 hr delay Result Date: 11/15/2023 CLINICAL DATA:  8Hr Delay Small Bowel F/u EXAM: PORTABLE ABDOMEN - 1 VIEW COMPARISON:  CT abdomen pelvis 11/14/2023 FINDINGS: Enteric tube with tip overlying the expected region of the gastric lumen and side port overlying the expected region of the  gastroesophageal junction. Persistent gaseous dilatation of several loops of small bowel. PO contrast opacifies the small bowel. Question possible PO contrast within the large bowel. No radio-opaque calculi or other significant radiographic abnormality are seen. Lumbar surgical hardware. Vascular calcifications. IMPRESSION: Enteric tube with tip overlying the expected region of the gastric lumen and side port overlying the expected region of the gastroesophageal junction. Recommend advancement by 2 cm. PO contrast opacifies the small bowel. Question possible PO contrast within the large bowel. Persistent gaseous dilatation of several loops of small bowel consistent with small-bowel obstruction. Electronically Signed   By: Morgane  Naveau M.D.   On: 11/15/2023 21:12   DG Abd 1 View Result Date: 11/15/2023 EXAM: 1 VIEW XRAY OF THE ABDOMEN SUPINE 11/15/2023 12:29:00 AM COMPARISON: 11/14/2023 CLINICAL HISTORY: Encounter for imaging study to confirm nasogastric (NG) tube placement. FINDINGS: BOWEL: Nonobstructive bowel gas pattern. PERITONEUM AND SOFT TISSUES: No abnormal calcifications. BONES: No acute osseous abnormality. LINES AND TUBES: Enteric tube terminates in the proximal stomach with its side port at the gastroesophageal junction. This is unchanged from prior. IMPRESSION: 1. Enteric tube terminates in the proximal stomach with its side port at the GE junction, unchanged. If used for decompression, this is likely satisfactory position. Electronically signed by: Pinkie Pebbles MD 11/15/2023 12:34 AM EDT RP Workstation: [id number]   DG Abd 2 Views Result Date: 11/14/2023 EXAM: 2 VIEW XRAY OF THE ABDOMEN SUPINE 11/14/2023 05:59:00 PM COMPARISON: Earlier today. CLINICAL HISTORY: [id number] Encounter for imaging study to confirm nasogastric (NG) tube placement [id number]. NG tube placement. FINDINGS: BOWEL: Dilated central small bowel, suggesting small bowel obstruction. PERITONEUM AND SOFT TISSUES: No abnormal  calcifications. BONES: Lumbar spine fixation hardware. VASCULATURE: Thoracic aortic atherosclerosis. LINES AND TUBES: Enteric tube terminates in the proximal gastric body with side port just below the GE junction. Consider advancement of 4 cm if clinically warranted. IMPRESSION: 1. Enteric tube terminates in the proximal gastric body with side port just below the GE junction. Consider advancement of 4 cm if clinically warranted. 2. Dilated central small bowel, suggesting small bowel obstruction. Electronically signed by: Pinkie Pebbles MD 11/14/2023 08:05 PM EDT RP Workstation: [id number]   DG Abd Portable 1 View Result  Date: 11/14/2023 CLINICAL DATA:  NG tube placement. EXAM: PORTABLE ABDOMEN - 1 VIEW COMPARISON:  March 01, 2022 FINDINGS: A nasogastric tube is seen with its distal tip positioned just beyond the gastroesophageal junction. The distal side hole sits within the expected region of the distal esophagus. Numerous dilated, air-filled small bowel loops are seen throughout the abdomen and pelvis. No radio-opaque calculi are seen. Postoperative changes are present throughout the lumbar spine. IMPRESSION: 1. Nasogastric tube positioning, as described above. Further advancement by proximally 7 cm is recommended. 2. Findings consistent with a small bowel obstruction. Electronically Signed   By: Suzen Dials M.D.   On: 11/14/2023 16:26   CT ABDOMEN PELVIS W CONTRAST Result Date: 11/14/2023 CLINICAL DATA:  Abdomen pain constipation emesis EXAM: CT ABDOMEN AND PELVIS WITH CONTRAST TECHNIQUE: Multidetector CT imaging of the abdomen and pelvis was performed using the standard protocol following bolus administration of intravenous contrast. RADIATION DOSE REDUCTION: This exam was performed according to the departmental dose-optimization program which includes automated exposure control, adjustment of the mA and/or kV according to patient size and/or use of iterative reconstruction technique. CONTRAST:   OMNIPAQUE  IOHEXOL  300 MG/ML  SOLN COMPARISON:  CT 01/04/2023, 03/01/2019 CT, 03/28/2021 FINDINGS: Lower chest: Lung bases demonstrate emphysema. No acute airspace disease. Partially visualized breast implants. Hepatobiliary: Distended gallbladder. No calcified stone. Slight central intra hepatic biliary dilatation. Common bile duct diameter at the head of the pancreas measuring 7 mm. Pancreas: No inflammation. Mild dilatation of pancreatic duct measuring up to 5 mm at the body, series 2, image 24. Spleen: Normal in size without focal abnormality. Adrenals/Urinary Tract: Adrenal glands are normal. Kidneys show no hydronephrosis. The bladder is unremarkable Stomach/Bowel: Moderate fluid distension of stomach. Multiple dilated fluid-filled loops of small bowel measuring up to 4.8 cm and consistent with high-grade bowel obstruction. Several suspected transition points, for example left lower quadrant, series 2, image 42 where there is a decompressed segment of small bowel with mild wall thickening and upstream small bowel distension, relatively decompressed small bowel segment distal to surgical anastomosis in the right lower quadrant, coronal series 4 image 44 through 50. Completely decompressed distal small bowel in the pelvis. Vascular/Lymphatic: Aortic atherosclerosis. No enlarged abdominal or pelvic lymph nodes. Reproductive: Hysterectomy.  No adnexal mass. Other: No free air. Small volume fluid in the pelvis. Left posterolateral hernia contains fat and a short segment of dilated fluid-filled bowel but there appears to be fluid-filled dilated bowel both proximal and distal to the hernia. Musculoskeletal: Mild scoliosis. Spinal fusion hardware L1 through L5. No acute osseous abnormality IMPRESSION: 1. Findings consistent with high-grade small bowel obstruction, several areas of suspected transition within the small bowel with segments of decompressed small bowel that exhibit mild wall thickening. Distal small  bowel in the pelvis is completely decompressed. Findings could be secondary to adhesive disease. A left posterolateral hernia containing fat and short segment of dilated fluid-filled bowel is noted, but there appears to be fluid-filled distended bowel both proximal and distal to this hernia. 2. Trace volume of free fluid.  No free air. 3. Emphysema 4. Distended gallbladder. Mild central intra hepatic biliary dilatation and slight dilatation of pancreatic duct. Correlate with LFTs with follow-up MRCP if indicated Electronically Signed   By: Luke Bun M.D.   On: 11/14/2023 15:45   Today   Subjective    Terri Harris today has no new concerns   No fever  Or chills   No Nausea, Vomiting or Diarrhea Tolerating oral intake well-- --  Patient has been seen and examined prior to discharge   Objective   Blood pressure 134/63, pulse (!) 59, temperature 98.3 F (36.8 C), temperature source Oral, resp. rate 18, height 5' 5 (1.651 m), weight 49.9 kg, SpO2 95%.   Intake/Output Summary (Last 24 hours) at 11/16/2023 1136 Last data filed at 11/16/2023 0926 Gross per 24 hour  Intake 2464.69 ml  Output 600 ml  Net 1864.69 ml    Exam Gen:- Awake Alert, no acute distress  HEENT:- [place].AT, No sclera icterus Neck-Supple Neck,No JVD,.  Lungs-  CTAB , good air movement bilaterally CV- S1, S2 normal, regular Abd-  +ve B.Sounds, Abd Soft, No significant tenderness, healed abdominal scars  Extremity/Skin:- No  edema,   good pulses Psych-affect is appropriate, oriented x3 Neuro-no new focal deficits, no tremors    Data Review   CBC w Diff:  Lab Results  Component Value Date   WBC 7.8 11/14/2023   HGB 15.6 (H) 11/14/2023   HGB 15.5 04/09/2023   HCT 48.1 (H) 11/14/2023   HCT 45.8 04/09/2023   PLT 234 11/14/2023   PLT 293 04/09/2023   LYMPHOPCT 14 11/14/2023   MONOPCT 15 11/14/2023   EOSPCT 0 11/14/2023   BASOPCT 1 11/14/2023   CMP:  Lab Results  Component Value Date   NA 142  11/16/2023   NA 140 10/10/2023   K 2.9 (L) 11/16/2023   CL 102 11/16/2023   CO2 29 11/16/2023   BUN 11 11/16/2023   BUN 10 10/10/2023   CREATININE 0.44 11/16/2023   PROT 8.3 (H) 11/14/2023   PROT 7.9 04/09/2023   ALBUMIN  4.7 11/14/2023   ALBUMIN  5.0 (H) 04/09/2023   BILITOT 0.7 11/14/2023   BILITOT 0.5 04/09/2023   ALKPHOS 52 11/14/2023   AST 36 11/14/2023   ALT 20 11/14/2023   Total Discharge time is about 33 minutes  Rendall Carwin M.D on 11/16/2023 at 11:36 AM  Go to [email or web address] -  for contact info  [facility] - Office  [phone]

## 2023-11-17 ENCOUNTER — Ambulatory Visit: Payer: Self-pay | Admitting: Family Medicine

## 2023-11-17 ENCOUNTER — Telehealth: Payer: Self-pay | Admitting: Family Medicine

## 2023-11-17 NOTE — Telephone Encounter (Signed)
 Please arrange a hospital follow-up with me preferably somewhere in the next 2 weeks thank you

## 2023-11-19 ENCOUNTER — Telehealth: Payer: Self-pay | Admitting: *Deleted

## 2023-11-19 NOTE — Transitions of Care (Post Inpatient/ED Visit) (Signed)
 11/19/2023  Name: Terri Harris MRN: [id number] DOB: 03/11/1941  Today's TOC FU Call Status: Today's TOC FU Call Status:: Successful TOC FU Call Completed TOC FU Call Complete Date: 11/19/23 Patient's Name and Date of Birth confirmed.  Transition Care Management Follow-up Telephone Call Date of Discharge: 11/16/23 Dis[facility] Type of Discharge: Inpatient Admission Primary Inpatient Discharge Diagnosis:: Recurrent SBO- without need for surgery How have you been since you were released from the hospital?: Better (I am doing okay, no problems; been going to the bathroom normally, able to do everything for myself, but my daughter will help if I need it.  I am weak after being in the hospital, so I am just taking my time and not over-doing) Any questions or concerns?: No  Items Reviewed: Did you receive and understand the discharge instructions provided?: Yes (thoroughly reviewed with patient who verbalizes good understanding of same) Medications obtained,verified, and reconciled?: Yes (Medications Reviewed) (Full medication reconciliation/ review completed; no concerns or discrepancies identified; confirmed patient obtained/ is taking all newly Rx'd medications as instructed; self-manages medications and denies questions/ concerns around medications today) Any new allergies since your discharge?: No Dietary orders reviewed?: Yes Type of Diet Ordered:: Regular, but soft to prevent any constipation Do you have support at home?: Yes People in Home [RPT]: child(ren), adult, spouse Name of Support/Comfort Primary Source: Reports independent in self-care activities; resides with supportive spouse and adult daughtert-- assists as/ if needed/ indicated  Medications Reviewed Today: Medications Reviewed Today     Reviewed by Earline Stiner M, RN (Registered Nurse) on 11/19/23 at 1057  Med List Status: <None>   Medication Order Taking? Sig Documenting Provider Last Dose  Status Informant  amLODipine  (NORVASC ) 5 MG tablet [id number] Yes Take 1 tablet (5 mg total) by mouth daily. Pearlean Manus, MD  Active   bisacodyl  (DULCOLAX) 10 MG suppository [id number] Yes Place 1 suppository (10 mg total) rectally at bedtime. Pearlean Manus, MD  Active   pantoprazole  (PROTONIX ) 40 MG tablet [id number] Yes Take 1 tablet (40 mg total) by mouth daily. Pearlean Manus, MD  Active   polyethylene glycol (MIRALAX  / GLYCOLAX ) 17 g packet [id number] Yes Take 17 g by mouth daily as needed for mild constipation. Pearlean Manus, MD  Active   potassium chloride  SA (KLOR-CON  M) 20 MEQ tablet [id number] Yes Take 1 tablet (20 mEq total) by mouth daily for 5 days. Pearlean Manus, MD  Active   rosuvastatin  (CRESTOR ) 10 MG tablet [id number] Yes Take 1 tablet (10 mg total) by mouth daily. Pearlean Manus, MD  Active   traZODone  (DESYREL ) 50 MG tablet [id number] Yes Take 1 tablet (50 mg total) by mouth at bedtime as needed for sleep. Pearlean Manus, MD  Active            Home Care and Equipment/Supplies: Were Home Health Services Ordered?: No Any new equipment or medical supplies ordered?: No  Functional Questionnaire: Do you need assistance with bathing/showering or dressing?: Yes (My daughter supervises when I take a shower, butI do it by myself) Do you need assistance with meal preparation?: Yes (daughter/ spouse assists as- if needed) Do you need assistance with eating?: No Do you have difficulty maintaining continence: No Do you need assistance with getting out of bed/getting out of a chair/moving?: No Do you have difficulty managing or taking your medications?: No  Follow up appointments reviewed: PCP Follow-up appointment confirmed?: Yes Date of PCP follow-up appointment?: 11/27/23 Follow-up Provider: PCP- Dr. Alphonsa Specialist [facility] Follow-up appointment  confirmed?: NA (verified not indicated per hospital discharging provider discharge notes) Do you need  transportation to your follow-up appointment?: No Do you understand care options if your condition(s) worsen?: Yes-patient verbalized understanding  SDOH Interventions Today    Flowsheet Row Most Recent Value  SDOH Interventions   Food Insecurity Interventions Intervention Not Indicated  Housing Interventions Intervention Not Indicated  Transportation Interventions Intervention Not Indicated  [reports drives self]  Utilities Interventions Intervention Not Indicated   Patient declines need for ongoing/ further care management/ coordination outreach; declines enrollment in 30-day TOC program- declines taking my direct phone number should needs/ concerns arise post-TOC call   See TOC assessment tabs for additional assessment/ TOC intervention information  Pls call/ message for questions,  Susy Placzek Mckinney Mahli Glahn, RN, BSN, Media planner  Transitions of Care  [facility] - [facility] [phone]: direct office

## 2023-11-27 ENCOUNTER — Encounter: Payer: Self-pay | Admitting: Family Medicine

## 2023-11-27 ENCOUNTER — Ambulatory Visit (INDEPENDENT_AMBULATORY_CARE_PROVIDER_SITE_OTHER): Admitting: Family Medicine

## 2023-11-27 VITALS — BP 128/70 | HR 92 | Temp 97.3°F | Ht 65.0 in | Wt 108.0 lb

## 2023-11-27 DIAGNOSIS — E538 Deficiency of other specified B group vitamins: Secondary | ICD-10-CM | POA: Diagnosis not present

## 2023-11-27 DIAGNOSIS — R1084 Generalized abdominal pain: Secondary | ICD-10-CM

## 2023-11-27 DIAGNOSIS — Z8719 Personal history of other diseases of the digestive system: Secondary | ICD-10-CM

## 2023-11-27 DIAGNOSIS — M791 Myalgia, unspecified site: Secondary | ICD-10-CM

## 2023-11-27 DIAGNOSIS — E876 Hypokalemia: Secondary | ICD-10-CM

## 2023-11-27 DIAGNOSIS — G894 Chronic pain syndrome: Secondary | ICD-10-CM

## 2023-11-27 DIAGNOSIS — R5383 Other fatigue: Secondary | ICD-10-CM

## 2023-11-27 MED ORDER — OXYCODONE-ACETAMINOPHEN 5-325 MG PO TABS: ORAL_TABLET | ORAL | 0 refills | Status: DC

## 2023-11-27 NOTE — Progress Notes (Signed)
 Subjective:    Patient ID: Terri Harris, female    DOB: 05-24-1941, 83 y.o.   MRN: [id number]  HPI  Hospital follow up for sbo  Pt report regular bm Still sob w/ activity Discussed the use of AI scribe software for clinical note transcription with the patient, who gave verbal consent to proceed.  History of Present Illness   Terri Harris is an 83 year old female who presents with generalized weakness and pain following a recent hospitalization for a small bowel obstruction.  She experiences generalized weakness and pain throughout her body, including muscles and joints, since her recent hospitalization for a small bowel obstruction. She describes feeling 'no strength' and notes that her energy levels have not returned to what she was prior to the hospital stay. No vomiting since her hospital discharge, and she reports passing gas and having soft bowel movements without blood. She confirms urination and reports low energy levels since her hospital stay.  She was hospitalized for a small bowel obstruction, which she attributes to eating bread and raisins. Since being discharged on November 16, 2023, she has been able to pass gas and has had two soft bowel movements this morning. She is attempting to eat small meals throughout the day, including vegetable juice and mashed broccoli.  Her current medication regimen includes pain medication, which she takes four times a day, typically at 5 AM, 8 AM, 12 PM, and 8 PM. She was also prescribed potassium tablets for five days following her discharge due to low potassium levels noted during her hospital stay.  In terms of her family history, her youngest brother and mother had bowel issues, with her mother having had her large intestines die. Socially, she is the last living sibling out of fourteen and has support from [place], who checks on her regularly. She has been staying indoors since her hospital discharge due to her inability to walk outside, although  she did manage to walk around her yard once recently.     This patient was seen today for chronic pain  The medication list was reviewed and updated.   Location of Pain for which the patient has been treated with regarding narcotics: Lower back sciatica as well  Onset of this pain: Present for years   -Compliance with medication: Good compliance  - Number patient states they take daily: Takes 4 tablets daily  -Reason for ongoing use of opioids cannot get adequate relief with Tylenol   What other measures have been tried outside of opioids Tylenol , NSAIDs, surgery  In the ongoing specialists regarding this condition previously back specialist  -when was the last dose patient took?  Past 24 hours  The patient was advised the importance of maintaining medication and not using illegal substances with these.  Here for refills and follow up  The patient was educated that we can provide 3 monthly scripts for their medication, it is their responsibility to follow the instructions.  Side effects or complications from medications: Denies side effects  Patient is aware that pain medications are meant to minimize the severity of the pain to allow their pain levels to improve to allow for better function. They are aware of that pain medications cannot totally remove their pain.  Due for UDT ( at least once per year) (pain management contract is also completed at the time of the UDT): On next visit  Scale of 1 to 10 ( 1 is least 10 is most) Your pain level without the medicine:  8-9 Your pain level with medication 5  Scale 1 to 10 ( 1-helps very little, 10 helps very well) How well does your pain medication reduce your pain so you can function better through out the day?  7  Quality of the pain: Throbbing aching  Persistence of the pain: Present all time  Modifying factors: Worse with activity      Review of Systems     Objective:   Physical Exam General-in no acute  distress Eyes-no discharge Lungs-respiratory rate normal, CTA CV-no murmurs,RRR Extremities skin warm dry no edema Neuro grossly normal Behavior normal, alert Abdomen is soft no masses or tenderness       Assessment & Plan:  1. Generalized abdominal pain (Primary) Labs ordered await results soft diet frequent intake recommended - Lipase - Hepatic Function Panel  2. Hypokalemia Potassium previously low recheck this - Basic Metabolic Panel  3. Chronic pain syndrome The patient was seen in followup for chronic pain. A review over at their current pain status was discussed. Drug registry was checked. Prescriptions were given.  Regular follow-up recommended. Discussion was held regarding the importance of compliance with medication as well as pain medication contract.  Patient was informed that medication may cause drowsiness and should not be combined  with other medications/alcohol or street drugs. If the patient feels medication is causing altered alertness then do not drive or operate dangerous equipment.  Should be noted that the patient appears to be meeting appropriate use of opioids and response.  Evidenced by improved function and decent pain control without significant side effects and no evidence of overt aberrancy issues.  Upon discussion with the patient today they understand that opioid therapy is optional and they feel that the pain has been refractory to reasonable conservative measures and is significant and affecting quality of life enough to warrant ongoing therapy and wishes to continue opioids.  Refills were provided.  [place]  medical Board guidelines regarding the pain medicine has been reviewed.  CDC guidelines most updated 2022 has been reviewed by the prescriber.  PDMP is checked on a regular basis yearly urine drug screen and pain management contract  Treatment plan for this patient includes #1-gentle stretching exercises as shown daily basis 2.  Mild  strength exercises 3 times per week #3 continue pain medications #4 notify us  if any digression  2 prescriptions were sent in today she has a follow-up in August  4. Myalgia Check lab work - C-reactive protein  5. Low energy Check lab work - CBC with Differential - Vitamin B12  6. History of small bowel obstruction No sign of obstruction currently warning signs discussed  B12 deficiency check B12  I believe the pain medicine is clinically indicated that she is taking it safely but at the same time I have encouraged her on days she does not need as much to cut the tablet in half or take slightly less I have also encouraged her to do a good job of incorporating enough fiber in her diet to keep her bowel movements soft to try to avoid small bowel obstruction she will follow-up in August as planned

## 2023-11-28 ENCOUNTER — Ambulatory Visit: Payer: Self-pay | Admitting: Family Medicine

## 2023-11-28 LAB — CBC WITH DIFFERENTIAL/PLATELET
Basophils Absolute: 0.1 10*3/uL (ref 0.0–0.2)
Basos: 1 %
EOS (ABSOLUTE): 0 10*3/uL (ref 0.0–0.4)
Eos: 0 %
Hematocrit: 45.5 % (ref 34.0–46.6)
Hemoglobin: 15.3 g/dL (ref 11.1–15.9)
Immature Grans (Abs): 0 10*3/uL (ref 0.0–0.1)
Immature Granulocytes: 0 %
Lymphocytes Absolute: 1.1 10*3/uL (ref 0.7–3.1)
Lymphs: 11 %
MCH: 32.8 pg (ref 26.6–33.0)
MCHC: 33.6 g/dL (ref 31.5–35.7)
MCV: 97 fL (ref 79–97)
Monocytes Absolute: 0.6 10*3/uL (ref 0.1–0.9)
Monocytes: 6 %
Neutrophils Absolute: 8.2 10*3/uL — ABNORMAL HIGH (ref 1.4–7.0)
Neutrophils: 82 %
Platelets: 274 10*3/uL (ref 150–450)
RBC: 4.67 x10E6/uL (ref 3.77–5.28)
RDW: 13.1 % (ref 11.7–15.4)
WBC: 10 10*3/uL (ref 3.4–10.8)

## 2023-11-28 LAB — BASIC METABOLIC PANEL WITH GFR
BUN/Creatinine Ratio: 20 (ref 12–28)
BUN: 12 mg/dL (ref 8–27)
CO2: 21 mmol/L (ref 20–29)
Calcium: 10.4 mg/dL — ABNORMAL HIGH (ref 8.7–10.3)
Chloride: 101 mmol/L (ref 96–106)
Creatinine, Ser: 0.59 mg/dL (ref 0.57–1.00)
Glucose: 115 mg/dL — ABNORMAL HIGH (ref 70–99)
Potassium: 4.3 mmol/L (ref 3.5–5.2)
Sodium: 141 mmol/L (ref 134–144)
eGFR: 90 mL/min/{1.73_m2} (ref >59)

## 2023-11-28 LAB — HEPATIC FUNCTION PANEL
ALT: 22 IU/L (ref 0–32)
AST: 31 IU/L (ref 0–40)
Albumin: 5 g/dL — ABNORMAL HIGH (ref 3.7–4.7)
Alkaline Phosphatase: 62 IU/L (ref 44–121)
Bilirubin Total: 0.3 mg/dL (ref 0.0–1.2)
Bilirubin, Direct: 0.11 mg/dL (ref 0.00–0.40)
Total Protein: 7.9 g/dL (ref 6.0–8.5)

## 2023-11-28 LAB — VITAMIN B12: Vitamin B-12: 1276 pg/mL — ABNORMAL HIGH (ref 232–1245)

## 2023-11-28 LAB — LIPASE: Lipase: 43 U/L (ref 14–85)

## 2024-01-07 ENCOUNTER — Ambulatory Visit: Admitting: Family Medicine

## 2024-01-07 ENCOUNTER — Encounter: Payer: Self-pay | Admitting: Family Medicine

## 2024-01-07 VITALS — BP 133/68 | HR 79 | Temp 97.9°F | Ht 65.0 in | Wt 111.0 lb

## 2024-01-07 DIAGNOSIS — G8929 Other chronic pain: Secondary | ICD-10-CM

## 2024-01-07 MED ORDER — OXYCODONE-ACETAMINOPHEN 5-325 MG PO TABS: ORAL_TABLET | ORAL | 0 refills | Status: DC

## 2024-01-07 NOTE — Progress Notes (Signed)
 Subjective:    Patient ID: Terri Harris, female    DOB: 01/10/1941, 83 y.o.   MRN: [id number]  HPI  3 month follow up and pain management   Review of Systems    This patient was seen today for chronic pain  The medication list was reviewed and updated.   Location of Pain for which the patient has been treated with regarding narcotics: Back hips knees  Onset of this pain: Present for years   -Compliance with medication: Good compliance  - Number patient states they take daily: 4/day  -Reason for ongoing use of opioids does not get adequate relief with Tylenol NSAIDs  What other measures have been tried outside of opioids Tylenol NSAIDs surgery  In the ongoing specialists regarding this condition back specialist in the past  -when was the last dose patient took?  Past 24 hours  The patient was advised the importance of maintaining medication and not using illegal substances with these.  Here for refills and follow up  The patient was educated that we can provide 3 monthly scripts for their medication, it is their responsibility to follow the instructions.  Side effects or complications from medications: Denies side effects  Patient is aware that pain medications are meant to minimize the severity of the pain to allow their pain levels to improve to allow for better function. They are aware of that pain medications cannot totally remove their pain.  Due for UDT ( at least once per year) (pain management contract is also completed at the time of the UDT): Next visit  Scale of 1 to 10 ( 1 is least 10 is most) Your pain level without the medicine: 10 Your pain level with medication 7  Scale 1 to 10 ( 1-helps very little, 10 helps very well) How well does your pain medication reduce your pain so you can function better through out the day?  8  Quality of the pain: Throbbing aching  Persistence of the pain: Present all time  Modifying factors: Worse with  activity      Objective:   Physical Exam  General-in no acute distress Eyes-no discharge Lungs-respiratory rate normal, CTA CV-no murmurs,RRR Extremities skin warm dry no edema Neuro grossly normal Behavior normal, alert  Patient not actively suicidal but at times feels like she is ready to die, does not feel she would hurt herself     Assessment & Plan:  Patient does not want to be on any antidepressants currently The patient was seen in followup for chronic pain. A review over at their current pain status was discussed. Drug registry was checked. Prescriptions were given.  Regular follow-up recommended. Discussion was held regarding the importance of compliance with medication as well as pain medication contract.  Patient was informed that medication may cause drowsiness and should not be combined  with other medications/alcohol or street drugs. If the patient feels medication is causing altered alertness then do not drive or operate dangerous equipment.  Should be noted that the patient appears to be meeting appropriate use of opioids and response.  Evidenced by improved function and decent pain control without significant side effects and no evidence of overt aberrancy issues.  Upon discussion with the patient today they understand that opioid therapy is optional and they feel that the pain has been refractory to reasonable conservative measures and is significant and affecting quality of life enough to warrant ongoing therapy and wishes to continue opioids.  Refills were provided.  [employer]  [place] medical Board guidelines regarding the pain medicine has been reviewed.  CDC guidelines most updated 2022 has been reviewed by the prescriber.  PDMP is checked on a regular basis yearly urine drug screen and pain management contract  Treatment plan for this patient includes #1-gentle stretching exercises as shown daily basis 2.  Mild strength exercises 3 times per week #3 continue  pain medications #4 notify us  if any digression Counseling offered Follow-up 3 months no labs indicated today If her stress levels get worse or problematic follow-up sooner

## 2024-04-09 ENCOUNTER — Ambulatory Visit (INDEPENDENT_AMBULATORY_CARE_PROVIDER_SITE_OTHER): Admitting: Family Medicine

## 2024-04-09 ENCOUNTER — Encounter: Payer: Self-pay | Admitting: Family Medicine

## 2024-04-09 VITALS — BP 132/82 | HR 94 | Temp 98.1°F | Ht 65.0 in | Wt 111.1 lb

## 2024-04-09 DIAGNOSIS — J449 Chronic obstructive pulmonary disease, unspecified: Secondary | ICD-10-CM

## 2024-04-09 DIAGNOSIS — Z23 Encounter for immunization: Secondary | ICD-10-CM | POA: Diagnosis not present

## 2024-04-09 DIAGNOSIS — G894 Chronic pain syndrome: Secondary | ICD-10-CM

## 2024-04-09 MED ORDER — TRELEGY ELLIPTA 100-62.5-25 MCG/ACT IN AEPB: 1.0000 | INHALATION_SPRAY | Freq: Every day | RESPIRATORY_TRACT | 11 refills | Status: AC

## 2024-04-09 MED ORDER — OXYCODONE-ACETAMINOPHEN 5-325 MG PO TABS: ORAL_TABLET | ORAL | 0 refills | Status: AC

## 2024-04-09 MED ORDER — OXYCODONE-ACETAMINOPHEN 5-325 MG PO TABS: ORAL_TABLET | ORAL | 0 refills | Status: DC

## 2024-04-09 NOTE — Progress Notes (Signed)
 Subjective:    Patient ID: Terri Harris, female    DOB: 11-19-1940, 83 y.o.   MRN: [id number]  HPI This patient was seen today for chronic pain  The medication list was reviewed and updated.   Location of Pain for which the patient has been treated with regarding narcotics: Lumbar pain along with sciatica  Onset of this pain: Present for years   -Compliance with medication: Good compliance  - Number patient states they take daily: 4 tablets daily  -Reason for ongoing use of opioids not get adequate relief Tylenol  and cannot take NSAIDs has had previous surgery not a surgical candidate currently  What other measures have been tried outside of opioids see above  In the ongoing specialists regarding this condition previously back specialist  -when was the last dose patient took?  Past 24 hours  The patient was advised the importance of maintaining medication and not using illegal substances with these.  Here for refills and follow up  The patient was educated that we can provide 3 monthly scripts for their medication, it is their responsibility to follow the instructions.  Side effects or complications from medications: No side effects  Patient is aware that pain medications are meant to minimize the severity of the pain to allow their pain levels to improve to allow for better function. They are aware of that pain medications cannot totally remove their pain.  Due for UDT ( at least once per year) (pain management contract is also completed at the time of the UDT): Today  Scale of 1 to 10 ( 1 is least 10 is most) Your pain level without the medicine: 9 Your pain level with medication 5  Scale 1 to 10 ( 1-helps very little, 10 helps very well) How well does your pain medication reduce your pain so you can function better through out the day?  7  Quality of the pain: Aching throbbing  Persistence of the pain: Present all time  Modifying factors: Worse with  activity  Patient feels that 4 tablets a day does not adequately cover her pain we are trying to keep her MME below 50 we will allow for her to go to 5 tablets a day of the 5 mg oxycodone   Discussed the use of AI scribe software for clinical note transcription with the patient, who gave verbal consent to proceed.  History of Present Illness   Terri Harris is an 83 year old female with COPD who presents with worsening breathing difficulties.  She experiences worsening breathing difficulties, particularly in the mornings, with variability in symptoms day-to-day. She is able to walk around her house daily to pick up pecans and can walk fast, but she has to be careful to avoid falls. No chest pain or cough. She recalls being diagnosed with COPD after a test done a long time ago.  She is taking her cholesterol medication, potassium, and amlodipine  for blood pressure as prescribed. For pain management, she takes four 5 mg oxycodone  tablets daily, but feels this is not sufficient. The pain medication helps 'take the edge off' and allows her to rest a little, although she struggles with sleep at night and did not fill a previous prescription for sleep aids.  She denies alcohol use and mentions that she enjoys reading and doing crossword puzzles. She likes to get outside on nice days, although she notes that it gets dark early and the nights are long, especially when she is in pain. She mentions  that she does not have negative thoughts about hurting herself and tries to cope with stress as best as she can.             Review of Systems     Objective:   Physical Exam  General-in no acute distress Eyes-no discharge Lungs-respiratory rate normal, CTA CV-no murmurs,RRR Extremities skin warm dry no edema Neuro grossly normal Behavior normal, alert       Assessment & Plan:   Assessment and Plan    Chronic obstructive pulmonary disease (COPD) Suspected COPD progression with worsening  exertional dyspnea. - Ordered updated lung function test. - Prescribed inhaler with three medications, once daily. - Ordered chest X-ray.  Chronic pain syndrome Chronic pain suboptimally managed with current oxycodone  regimen. No surgical intervention desired. - Increased oxycodone  to five tablets daily. - Ensured medication is stored safely.  Encounter for immunization Agreed to receive flu shot. - Administered flu shot.      1. Chronic pain syndrome (Primary) The patient was seen in followup for chronic pain. A review over at their current pain status was discussed. Drug registry was checked. Prescriptions were given.  Regular follow-up recommended. Discussion was held regarding the importance of compliance with medication as well as pain medication contract.  Patient was informed that medication may cause drowsiness and should not be combined  with other medications/alcohol or street drugs. If the patient feels medication is causing altered alertness then do not drive or operate dangerous equipment.  Should be noted that the patient appears to be meeting appropriate use of opioids and response.  Evidenced by improved function and decent pain control without significant side effects and no evidence of overt aberrancy issues.  Upon discussion with the patient today they understand that opioid therapy is optional and they feel that the pain has been refractory to reasonable conservative measures and is significant and affecting quality of life enough to warrant ongoing therapy and wishes to continue opioids.  Refills were provided.  [place]  medical Board guidelines regarding the pain medicine has been reviewed.  CDC guidelines most updated 2022 has been reviewed by the prescriber.  PDMP is checked on a regular basis yearly urine drug screen and pain management contract  Treatment plan for this patient includes #1-gentle stretching exercises as shown daily basis 2.  Mild strength  exercises 3 times per week #3 continue pain medications #4 notify us  if any digression  - [id number]+Oxyco+Alc+Crt-Bund  2. Chronic obstructive pulmonary disease, unspecified COPD type (HCC) Will try Trelegy Will do chest x-ray as well Hold off on pulmonary function per patient request  3. Flu vaccine need Today - Flu vaccine HIGH DOSE PF(Fluzone  Trivalent)

## 2024-04-10 DIAGNOSIS — G894 Chronic pain syndrome: Secondary | ICD-10-CM | POA: Diagnosis not present

## 2024-04-12 LAB — DRUG SCREEN 764883 11+OXYCO+ALC+CRT-BUND

## 2024-04-16 ENCOUNTER — Ambulatory Visit: Payer: Self-pay | Admitting: Family Medicine

## 2024-04-16 LAB — DRUG SCREEN 764883 11+OXYCO+ALC+CRT-BUND
Hydrocodone: 56.9 mg/dL (ref 20.0–300.0)
pH, Urine: 5.8 (ref 4.5–8.9)

## 2024-04-16 LAB — OXYCODONE/OXYMORPHONE, CONFIRM
OXYCODONE/OXYMORPH: POSITIVE — AB
OXYCODONE: 1983 ng/mL
OXYCODONE: POSITIVE — AB
OXYMORPHONE (GC/MS): 1989 ng/mL
OXYMORPHONE: POSITIVE — AB

## 2024-04-16 LAB — OPIATES CONFIRMATION, URINE: Opiates: NEGATIVE ng/mL

## 2024-04-18 ENCOUNTER — Ambulatory Visit (HOSPITAL_COMMUNITY)
Admission: RE | Admit: 2024-04-18 | Discharge: 2024-04-18 | Disposition: A | Discharge status: Home / Self Care | Source: Ambulatory Visit | Attending: Family Medicine | Admitting: Family Medicine

## 2024-04-18 DIAGNOSIS — J449 Chronic obstructive pulmonary disease, unspecified: Secondary | ICD-10-CM | POA: Insufficient documentation

## 2024-04-18 DIAGNOSIS — R0600 Dyspnea, unspecified: Secondary | ICD-10-CM | POA: Diagnosis not present

## 2024-04-18 DIAGNOSIS — R918 Other nonspecific abnormal finding of lung field: Secondary | ICD-10-CM | POA: Diagnosis not present

## 2024-06-25 ENCOUNTER — Emergency Department (HOSPITAL_COMMUNITY)
Admission: EM | Admit: 2024-06-25 | Discharge: 2024-06-25 | Disposition: A | Discharge status: Home / Self Care | Attending: Emergency Medicine | Admitting: Emergency Medicine

## 2024-06-25 ENCOUNTER — Other Ambulatory Visit: Payer: Self-pay

## 2024-06-25 ENCOUNTER — Emergency Department (HOSPITAL_COMMUNITY)

## 2024-06-25 DIAGNOSIS — W009XXA Unspecified fall due to ice and snow, initial encounter: Secondary | ICD-10-CM | POA: Insufficient documentation

## 2024-06-25 DIAGNOSIS — S42211A Unspecified displaced fracture of surgical neck of right humerus, initial encounter for closed fracture: Secondary | ICD-10-CM | POA: Diagnosis not present

## 2024-06-25 DIAGNOSIS — I10 Essential (primary) hypertension: Secondary | ICD-10-CM | POA: Diagnosis not present

## 2024-06-25 DIAGNOSIS — Z79899 Other long term (current) drug therapy: Secondary | ICD-10-CM | POA: Insufficient documentation

## 2024-06-25 DIAGNOSIS — S4991XA Unspecified injury of right shoulder and upper arm, initial encounter: Secondary | ICD-10-CM | POA: Diagnosis present

## 2024-06-25 MED ORDER — HYDROMORPHONE HCL 1 MG/ML IJ SOLN
1.0000 mg | Freq: Once | INTRAMUSCULAR | Status: AC
  Administered 2024-06-25: 1 mg via INTRAVENOUS
  Filled 2024-06-25: qty 1

## 2024-06-25 MED ORDER — OXYCODONE-ACETAMINOPHEN 10-325 MG PO TABS: 1.0000 | ORAL_TABLET | Freq: Four times a day (QID) | ORAL | 0 refills | Status: AC | PRN

## 2024-06-25 MED ORDER — ONDANSETRON HCL 4 MG/2ML IJ SOLN
4.0000 mg | Freq: Once | INTRAMUSCULAR | Status: AC
  Administered 2024-06-25: 4 mg via INTRAVENOUS
  Filled 2024-06-25: qty 2

## 2024-06-25 MED ORDER — MORPHINE SULFATE (PF) 4 MG/ML IV SOLN
4.0000 mg | Freq: Once | INTRAVENOUS | Status: AC
  Administered 2024-06-25: 4 mg via INTRAVENOUS
  Filled 2024-06-25: qty 1

## 2024-06-25 NOTE — ED Provider Notes (Signed)
 " [place] EMERGENCY DEPARTMENT AT [facility] Provider Note   CSN: [id number] Arrival date & time: 06/25/24  1056     Patient presents with: Shoulder Injury   Terri Harris is a 84 y.o. female with a history significant for hypertension, arthritis and osteoporosis, presenting for evaluation of severe right shoulder pain after unfortunately slipping and falling on the ice at her home just prior to arrival.  She denies any other injury including head injury.  She states she landed directly on the lateral shoulder.  She denies head neck back hip and lower extremity pain.  She has no numbness in her arms but is unable to move the right arm secondary to severe pain.  She has had no treatment prior to arrival.   The history is provided by the patient.       Prior to Admission medications  Medication Sig Start Date End Date Taking? Authorizing Provider  oxyCODONE -acetaminophen  (PERCOCET) 10-325 MG tablet Take 1 tablet by mouth every 6 (six) hours as needed for pain. 06/25/24  Yes Casen Pryor, PA-C  amLODipine  (NORVASC ) 5 MG tablet Take 1 tablet (5 mg total) by mouth daily. 11/16/23 11/15/24  Pearlean Manus, MD  bisacodyl  (DULCOLAX) 10 MG suppository Place 1 suppository (10 mg total) rectally at bedtime. 11/16/23   Pearlean Manus, MD  Fluticasone-Umeclidin-Vilant (TRELEGY ELLIPTA ) 100-62.5-25 MCG/ACT AEPB Inhale 1 puff into the lungs daily. 04/09/24   Alphonsa Glendia LABOR, MD  [Paused] oxyCODONE -acetaminophen  (PERCOCET/ROXICET) 5-325 MG tablet 1 taken 5 times daily as needed for pain Wait to take this until: July 01, 2024 04/09/24   Alphonsa Glendia LABOR, MD  polyethylene glycol (MIRALAX  / GLYCOLAX ) 17 g packet Take 17 g by mouth daily as needed for mild constipation. 11/16/23   Pearlean Manus, MD  potassium chloride  SA (KLOR-CON  M) 20 MEQ tablet Take 1 tablet (20 mEq total) by mouth daily for 5 days. 11/16/23 04/09/24  Pearlean Manus, MD  rosuvastatin  (CRESTOR ) 10 MG tablet Take 1 tablet  (10 mg total) by mouth daily. 11/16/23 11/15/24  Pearlean Manus, MD    Allergies: Codeine and Fosamax  [alendronate  sodium]    Review of Systems  Constitutional:  Negative for fever.  HENT: Negative.    Eyes: Negative.   Gastrointestinal:  Negative for nausea and vomiting.  Genitourinary: Negative.   Musculoskeletal:  Positive for arthralgias. Negative for joint swelling and myalgias.  Neurological:  Negative for dizziness, weakness, numbness and headaches.    Updated Vital Signs BP 134/73   Pulse 70   Temp 97.9 F (36.6 C) (Oral)   Resp 17   Wt 49.9 kg   LMP  (LMP Unknown)   SpO2 93%   BMI 18.30 kg/m   Physical Exam Constitutional:      Appearance: She is well-developed.  HENT:     Head: Atraumatic.  Cardiovascular:     Pulses:          Radial pulses are 2+ on the right side and 2+ on the left side.     Comments: Pulses equal bilaterally Musculoskeletal:        General: Tenderness present.     Right shoulder: Deformity and bony tenderness present. No crepitus.     Right elbow: No swelling or deformity.     Right forearm: Normal. No tenderness.     Cervical back: Normal range of motion.  Skin:    General: Skin is warm and dry.  Neurological:     Mental Status: She is alert.  Sensory: No sensory deficit.     Motor: No weakness.     Deep Tendon Reflexes: Reflexes normal.     Comments: Equal grip strength bilaterally.  Distal sensation is intact bilaterally.     (all labs ordered are listed, but only abnormal results are displayed) Labs Reviewed - No data to display  EKG: None  Radiology: No results found. Results for orders placed or performed in visit on 04/09/24  [id number] 11+Oxyco+Alc+Crt-Bund   Collection Time: 04/10/24  8:30 AM  Result Value Ref Range   Amphetamines, Urine Negative Cutoff=1000 ng/mL   Barbiturate screen, urine Negative Cutoff=200 ng/mL   BENZODIAZ UR QL Negative Cutoff=200 ng/mL   Cannabinoid Quant, Ur Negative Cutoff=50 ng/mL    Cocaine (Metab.) Negative Cutoff=300 ng/mL   OPIATE SCREEN URINE See Final Results Cutoff=300 ng/mL   Oxycodone /Oxymorphone , Urine See Final Results Cutoff=300 ng/mL   PCP Quant, Ur Negative Cutoff=25 ng/mL   Methadone Screen, Urine Negative Cutoff=300 ng/mL   Propoxyphene Negative Cutoff=300 ng/mL   Meperidine  Negative Cutoff=200 ng/mL   Tramadol Ur Negative Cutoff=200 ng/mL   Ethanol, Urine Negative Cutoff=0.020 %   Creatinine, Urine 56.9 20.0 - 300.0 mg/dL   Nitrite Urine, Quantitative Negative Cutoff=200 mcg/mL   pH, Urine 5.8 4.5 - 8.9  Opiates Confirmation, Urine   Collection Time: 04/10/24  8:30 AM  Result Value Ref Range   Opiates Negative Cutoff=300 ng/mL  Oxycodone /Oxymorphone , Confirm   Collection Time: 04/10/24  8:30 AM  Result Value Ref Range   OXYCODONE /OXYMORPH Positive (A) Cutoff=300   OXYCODONE  Positive (A)    OXYCODONE  1,983 Cutoff=300 ng/mL   OXYMORPHONE  Positive (A)    OXYMORPHONE  (GC/MS) 1,989 Cutoff=300 ng/mL   DG Shoulder Right Result Date: 06/25/2024 CLINICAL DATA:  Fall, pain, decreased range of motion. EXAM: RIGHT SHOULDER - 2+ VIEW COMPARISON:  None Available. FINDINGS: Impacted right humeral neck fracture with minimal comminution. Glenohumeral joint appears intact. Mild degenerative changes in the right acromioclavicular joint. Visualized right chest is grossly unremarkable. IMPRESSION: Right humeral neck fracture. Electronically Signed   By: Newell Eke M.D.   On: 06/25/2024 12:45   DG Humerus Right Result Date: 06/25/2024 CLINICAL DATA:  Fall, decreased range of motion, pain. EXAM: RIGHT HUMERUS - 2+ VIEW COMPARISON:  None Available. FINDINGS: Impacted right humeral neck fracture with anteromedial displacement of the distal fracture fragment. Mild degenerative changes in the right acromioclavicular joint. Osteopenia. Visualized right chest is grossly unremarkable. IMPRESSION: Right humeral neck fracture. Electronically Signed   By: Newell Eke M.D.    On: 06/25/2024 12:44      Procedures   Medications Ordered in the ED  ondansetron  (ZOFRAN ) injection 4 mg (4 mg Intravenous Given 06/25/24 1201)  morphine  (PF) 4 MG/ML injection 4 mg (4 mg Intravenous Given 06/25/24 1200)  HYDROmorphone  (DILAUDID ) injection 1 mg (1 mg Intravenous Given 06/25/24 1220)  HYDROmorphone  (DILAUDID ) injection 1 mg (1 mg Intravenous Given 06/25/24 1415)                                    Medical Decision Making Patient presenting for evaluation after a slip and fall on ice, landing directly on the right shoulder, degree of tenderness and deformity would imply probable proximal humeral fracture.  This was confirmed with imaging.  Patient presented with severe pain localizing to this location, she is chronically on oxycodone  5 mg 4 chronic back pain.  She required several doses of pain medication, she  ultimately obtained relief, especially after being placed in her sling and swath.  Given her chronic oxycodone  use and tolerance, I have increased her oxycodone  to 10 mg tablets in place of her 5 mg strength tablets for the next 5 days.  She was given home instructions for wearing the sling at all times, encouraged ice is much as possible over the next several days to help minimize swelling.  She will need close follow-up with orthopedics which she understands, she was given a referral to Dr. Margrette for follow-up care.  Amount and/or Complexity of Data Reviewed Radiology: ordered.    Details: Imaging reviewed, and I agree with humeral neck fracture.  Patient is neurovascularly intact.  Risk Prescription drug management.        Final diagnoses:  Fx humeral neck, right, closed, initial encounter    ED Discharge Orders          Ordered    oxyCODONE -acetaminophen  (PERCOCET) 10-325 MG tablet  Every 6 hours PRN        06/25/24 1531               Rochell Puett, PA-C 06/28/24 GENNIE Charlyn Sora, MD 06/28/24 2151  "

## 2024-06-25 NOTE — Discharge Instructions (Signed)
 Wear the sling at all times to protect your fracture injury.  You will need close follow-up with Dr. Margrette, call his office for an appointment with him.  I have increased your Percocet to 10 mg tablets for the next 5 days, hold your chronic 5 mg prescription while you are on this increased dose regimen.  Return here for any new or worsening problems, escalating pain, numbness or weakness in your right hand or any changes in the coloration of your fingertips, these can be symptoms of a complication from this injury and would require immediate recheck.

## 2024-06-25 NOTE — ED Triage Notes (Signed)
 Pt reports she slipped in the snow this morning and has right shoulder pain.  Pt denies any LOC or pain anywhere else and did not hit her head.

## 2024-06-27 ENCOUNTER — Telehealth: Payer: Self-pay

## 2024-06-27 NOTE — Telephone Encounter (Signed)
 Patient went to the ER on 06/25/24 and has a fx arm. Her daughter left message that they need an appt . I returned her call and had to leave a message for her to call back.

## 2024-07-01 DIAGNOSIS — M51379 Other intervertebral disc degeneration, lumbosacral region without mention of lumbar back pain or lower extremity pain: Secondary | ICD-10-CM | POA: Insufficient documentation

## 2024-07-04 ENCOUNTER — Ambulatory Visit: Admitting: Orthopedic Surgery

## 2024-07-04 ENCOUNTER — Other Ambulatory Visit: Payer: Self-pay

## 2024-07-04 ENCOUNTER — Encounter: Payer: Self-pay | Admitting: Orthopedic Surgery

## 2024-07-04 VITALS — BP 134/73 | Ht 65.0 in | Wt 110.0 lb

## 2024-07-04 DIAGNOSIS — M62838 Other muscle spasm: Secondary | ICD-10-CM

## 2024-07-04 DIAGNOSIS — S42294A Other nondisplaced fracture of upper end of right humerus, initial encounter for closed fracture: Secondary | ICD-10-CM

## 2024-07-04 MED ORDER — TIZANIDINE HCL 2 MG PO TABS: 2.0000 mg | ORAL_TABLET | Freq: Four times a day (QID) | ORAL | 5 refills | Status: AC | PRN

## 2024-07-04 NOTE — Progress Notes (Signed)
" °  Intake history:  Chief Complaint  Patient presents with   Shoulder Injury    Right 06/25/24     BP 134/73 Comment: 06/25/24  Ht 5' 5 (1.651 m)   Wt 110 lb (49.9 kg)   LMP  (LMP Unknown)   BMI 18.30 kg/m  Body mass index is 18.3 kg/m.  Pharmacy? _____[facility]_________________________________  WHAT ARE WE SEEING YOU FOR TODAY?   Right shoulder   How long has this bothered you? (DOI?DOS?WS?)  on 06/25/24  Was there an injury? Yes  Anticoag.  No   Any ALLERGIES _______Allergies[1]  _____________________________________   Treatment:  Have you taken:  Tylenol  No  Advil  No  Had PT No  Had injection No  Other  __________shoulder immobilzer and Oxycodone_______________        [1]  Allergies Allergen Reactions   Codeine Nausea And Vomiting   Fosamax  [Alendronate  Sodium] Other (See Comments)    Gastritis    "

## 2024-07-04 NOTE — Progress Notes (Signed)
 "  Office Visit Note   Patient: Terri Harris           Date of Birth: 30-Oct-1940           MRN: [id number] Visit Date: 07/04/2024 Requested by: Alphonsa Glendia LABOR, MD [address] [place],  [place] [address] PCP: Alphonsa Glendia LABOR, MD   Assessment & Plan:  Imaging studies from the hospital.  My interpretation of the imaging studies.  Nondisplaced fracture by near criteria proximal humerus fracture mild comminution  Encounter Diagnoses  Name Primary?   Other closed nondisplaced fracture of proximal end of right humerus, initial encounter    Muscle spasm of right shoulder Yes    Meds ordered this encounter  Medications   tiZANidine  (ZANAFLEX ) 2 MG tablet    Sig: Take 1 tablet (2 mg total) by mouth every 6 (six) hours as needed for muscle spasms.    Dispense:  30 tablet    Refill:  75    84 year old female with chronic pain syndrome, chronic opioid management secondary to back pain, presents for evaluation of right shoulder fracture  Proximal humerus fracture meets the near criteria for nonoperative treatment  Recommend sling swath  X-ray in 2 weeks  If stable we can start physical therapy  Tizanidine  for muscle spasms     Subjective: Chief Complaint  Patient presents with   Shoulder Injury    Right 06/25/24    HPI: 84 year old female slip and fall injured right shoulder seen in the ER 9 days ago placed in sling and swath after diagnosis of right proximal humerus fracture  Review of systems complains of left hand ring and long finger tingling does not involve the arm at all  She is having muscle spasms in the right shoulder and having difficult time with relaxing  Chronic pain syndrome  Chronic opioid therapy for back pain  Past Medical History:  Diagnosis Date   ANA positive 01/2001   Arthritis    Arthritis 07/02/2013   ANA positive   Cancer (HCC)    partial hysterectomy age 36-uterine cancer   Coronary artery calcification 09/28/2021   GERD  (gastroesophageal reflux disease)    Hx of tobacco use, presenting hazards to health 07/02/2013   Hypercholesteremia    Hypertension    Ileus, postoperative (HCC) 07/02/2013   Osteoporosis    Pneumonia    hx of   SBO (small bowel obstruction) (HCC) 06/20/2103                 Visit Diagnoses:  1. Muscle spasm of right shoulder   2. Other closed nondisplaced fracture of proximal end of right humerus, initial encounter      Follow-Up Instructions: Return in about 10 days (around 07/14/2024) for FOLLOW UP, FRACTURE CARE, XRAYS, RIGHT, SHOULDER.    Objective: Vital Signs: BP 134/73 Comment: 06/25/24  Ht 5' 5 (1.651 m)   Wt 110 lb (49.9 kg)   LMP  (LMP Unknown)   BMI 18.30 kg/m   Physical Exam Vitals and nursing note reviewed.  Constitutional:      Appearance: Normal appearance.  HENT:     Head: Normocephalic and atraumatic.  Eyes:     General: No scleral icterus.       Right eye: No discharge.        Left eye: No discharge.     Extraocular Movements: Extraocular movements intact.     Conjunctiva/sclera: Conjunctivae normal.     Pupils: Pupils are equal, round, and reactive  to light.  Cardiovascular:     Rate and Rhythm: Normal rate.     Pulses: Normal pulses.  Skin:    General: Skin is warm and dry.     Capillary Refill: Capillary refill takes less than 2 seconds.  Neurological:     General: No focal deficit present.     Mental Status: She is alert and oriented to person, place, and time.  Psychiatric:        Mood and Affect: Mood normal.        Behavior: Behavior normal.        Thought Content: Thought content normal.        Judgment: Judgment normal.      Right Shoulder Exam   Comments:  Sling was readjusted.  No swelling of the hand.  Complete normal function in the hand in terms of grip opening closing.  She does have swelling and tenderness in the proximal shoulder and tense muscle spasms in the trapezius muscle parascapular musculature       Specialty  Comments:  No specialty comments available.  Imaging: DG Shoulder Right Result Date: 07/04/2024 X-ray report right shoulder Proximal humerus fracture January 28 9-day follow-up x-ray AP lateral right shoulder reasonable alignment seen in the proximal humerus with some comminution characteristic posterior angulation Meets criteria for nonoperative treatment Proximal humerus fracture seems to be stable at 9 days     PMFS History: Patient Active Problem List   Diagnosis Date Noted   Degeneration of lumbosacral intervertebral disc 07/01/2024   SBO (small bowel obstruction) (HCC) 11/14/2023   Senile osteoporosis 05/16/2022   Colonic obstruction (HCC) 02/28/2022   Hypokalemia 02/28/2022   Aortic atherosclerosis 09/28/2021   Coronary artery calcification 09/28/2021   COPD exacerbation (HCC) 09/02/2021   Senile purpura 08/09/2021   Vitamin B12 deficiency 12/09/2019   Lumbar stenosis 01/07/2019   Nocturnal headaches 01/26/2016   Hyperlipidemia 04/19/2015   Chronic pain 04/19/2015   HTN (hypertension) 03/27/2014   GERD (gastroesophageal reflux disease) 07/02/2013   Hx of tobacco use, presenting hazards to health 07/02/2013   Arthritis 07/02/2013   Gastritis 04/13/2013   Abdominal pain 04/13/2013   Osteoporosis 04/13/2013   History of colonic polyps 04/13/2013   COPD (chronic obstructive pulmonary disease) (HCC) 04/13/2013   Past Medical History:  Diagnosis Date   ANA positive 01/2001   Arthritis    Arthritis 07/02/2013   ANA positive   Cancer (HCC)    partial hysterectomy age 29-uterine cancer   Coronary artery calcification 09/28/2021   GERD (gastroesophageal reflux disease)    Hx of tobacco use, presenting hazards to health 07/02/2013   Hypercholesteremia    Hypertension    Ileus, postoperative (HCC) 07/02/2013   Osteoporosis    Pneumonia    hx of   SBO (small bowel obstruction) (HCC) 06/20/2103    Family History  Problem Relation Age of Onset   Hypertension Mother    Heart  disease Father    Diabetes Sister    Heart disease Sister    Diabetes Brother    Heart disease Brother    Anesthesia problems Neg Hx    Hypotension Neg Hx    Malignant hyperthermia Neg Hx    Pseudochol deficiency Neg Hx     Past Surgical History:  Procedure Laterality Date   ABDOMINAL HYSTERECTOMY     ANTERIOR LAT LUMBAR FUSION N/A 06/08/2014   Procedure: LUMBAR 2-3 LUMBAR 3-4 LUMBAR 4-5 ANTEROLATERAL DECOMPRESSION/FUSION WITH PERCUTANEOUS PEDICLE SCREWS;  Surgeon: Victory Gens, MD;  Location:  [facility];  Service: Neurosurgery;  Laterality: N/A;  L2-3 L3-4 L4-5 ANTEROLATERAL DECOMPRESSION/FUSION WITH PERCUTANEOUS PEDICLE SCREWS   ANTERIOR LAT LUMBAR FUSION Left 01/07/2019   Procedure: Lumbar one-two Anterolateral lumbar interbody fusion with lateral plate fixation;  Surgeon: Colon Shove, MD;  Location: [facility] OR;  Service: Neurosurgery;  Laterality: Left;   BACK SURGERY     BOWEL RESECTION N/A 06/20/2013   Procedure: SMALL BOWEL RESECTION;  Surgeon: Vicenta DELENA Poli, MD;  Location: [facility] OR;  Service: General;  Laterality: N/A;   COLON SURGERY     COLONOSCOPY  10/25/2011   Procedure: COLONOSCOPY;  Surgeon: Claudis RAYMOND Rivet, MD;  Location: [facility];  Service: Endoscopy;  Laterality: N/A;  930   COLONOSCOPY N/A 05/09/2017   Procedure: COLONOSCOPY;  Surgeon: Rivet Claudis RAYMOND, MD;  Location: [facility];  Service: Endoscopy;  Laterality: N/A;  1030   ESOPHAGEAL DILATION N/A 05/25/2015   Procedure: ESOPHAGEAL DILATION;  Surgeon: Claudis RAYMOND Rivet, MD;  Location: [facility];  Service: Endoscopy;  Laterality: N/A;   ESOPHAGOGASTRODUODENOSCOPY  02/2009   ESOPHAGOGASTRODUODENOSCOPY N/A 05/25/2015   Procedure: ESOPHAGOGASTRODUODENOSCOPY (EGD);  Surgeon: Claudis RAYMOND Rivet, MD;  Location: [facility];  Service: Endoscopy;  Laterality: N/A;   HERNIA REPAIR Left    groin   INGUINAL HERNIA REPAIR Left 09/11/2013   Procedure: HERNIA REPAIR INGUINAL INCARCERATED;  Surgeon: Vicenta DELENA Poli, MD;   Location: [facility] OR;  Service: General;  Laterality: Left;   LAPAROTOMY N/A 06/20/2013   Procedure: EXPLORATORY LAPAROTOMY;  Surgeon: Vicenta DELENA Poli, MD;  Location: [facility] OR;  Service: General;  Laterality: N/A;   LUMBAR PERCUTANEOUS PEDICLE SCREW 3 LEVEL N/A 06/08/2014   Procedure: LUMBAR PERCUTANEOUS PEDICLE SCREW 3 LEVEL;  Surgeon: Shove Colon, MD;  Location: [facility];  Service: Neurosurgery;  Laterality: N/A;   POLYPECTOMY  05/09/2017   Procedure: POLYPECTOMY;  Surgeon: Rivet Claudis RAYMOND, MD;  Location: [facility];  Service: Endoscopy;;  sigmoid colon   TONSILLECTOMY     Social History   Occupational History   Not on file  Tobacco Use   Smoking status: Former    Current packs/day: 0.00    Average packs/day: 0.3 packs/day    Types: Cigarettes    Start date: 10/18/2011    Quit date: 10/18/2011    Years since quitting: 12.7   Smokeless tobacco: Never  Vaping Use   Vaping status: Never Used  Substance and Sexual Activity   Alcohol use: No   Drug use: No   Sexual activity: Not Currently       "

## 2024-07-09 ENCOUNTER — Ambulatory Visit: Admitting: Family Medicine

## 2024-07-14 ENCOUNTER — Encounter: Admitting: Orthopedic Surgery
